# Patient Record
Sex: Female | Born: 1940
Health system: Southern US, Community
[De-identification: ages and names within clinical notes are randomized; demographics above are authoritative.]

## PROBLEM LIST (undated history)

## (undated) DIAGNOSIS — L509 Urticaria, unspecified: Secondary | ICD-10-CM

## (undated) DIAGNOSIS — I509 Heart failure, unspecified: Secondary | ICD-10-CM

## (undated) DIAGNOSIS — I10 Essential (primary) hypertension: Secondary | ICD-10-CM

## (undated) HISTORY — DX: Urticaria, unspecified: L50.9

## (undated) HISTORY — PX: TUBAL LIGATION: SHX77

## (undated) HISTORY — PX: APPENDECTOMY: SHX54

## (undated) HISTORY — PX: BACK SURGERY: SHX140

---

## 1999-02-05 ENCOUNTER — Other Ambulatory Visit: Admission: RE | Admit: 1999-02-05 | Discharge: 1999-02-05 | Payer: Self-pay | Admitting: Gynecology

## 1999-12-10 ENCOUNTER — Other Ambulatory Visit: Admission: RE | Admit: 1999-12-10 | Discharge: 1999-12-10 | Payer: Self-pay | Admitting: Obstetrics and Gynecology

## 2001-02-04 ENCOUNTER — Other Ambulatory Visit: Admission: RE | Admit: 2001-02-04 | Discharge: 2001-02-04 | Payer: Self-pay | Admitting: Obstetrics and Gynecology

## 2002-04-13 ENCOUNTER — Other Ambulatory Visit: Admission: RE | Admit: 2002-04-13 | Discharge: 2002-04-13 | Payer: Self-pay | Admitting: General Surgery

## 2002-04-26 ENCOUNTER — Other Ambulatory Visit: Admission: RE | Admit: 2002-04-26 | Discharge: 2002-04-26 | Payer: Self-pay | Admitting: Obstetrics and Gynecology

## 2002-06-10 ENCOUNTER — Ambulatory Visit (HOSPITAL_COMMUNITY): Admission: RE | Admit: 2002-06-10 | Discharge: 2002-06-10 | Payer: Self-pay | Admitting: Family Medicine

## 2002-06-10 ENCOUNTER — Encounter: Payer: Self-pay | Admitting: Family Medicine

## 2003-05-10 ENCOUNTER — Encounter: Payer: Self-pay | Admitting: Family Medicine

## 2003-05-10 ENCOUNTER — Ambulatory Visit (HOSPITAL_COMMUNITY): Admission: RE | Admit: 2003-05-10 | Discharge: 2003-05-10 | Payer: Self-pay | Admitting: Family Medicine

## 2004-05-08 ENCOUNTER — Emergency Department (HOSPITAL_COMMUNITY): Admission: EM | Admit: 2004-05-08 | Discharge: 2004-05-08 | Payer: Self-pay | Admitting: Emergency Medicine

## 2004-06-01 ENCOUNTER — Other Ambulatory Visit: Admission: RE | Admit: 2004-06-01 | Discharge: 2004-06-01 | Payer: Self-pay | Admitting: Obstetrics and Gynecology

## 2005-05-27 ENCOUNTER — Ambulatory Visit (HOSPITAL_COMMUNITY): Admission: RE | Admit: 2005-05-27 | Discharge: 2005-05-27 | Payer: Self-pay | Admitting: Family Medicine

## 2005-09-09 ENCOUNTER — Other Ambulatory Visit: Admission: RE | Admit: 2005-09-09 | Discharge: 2005-09-09 | Payer: Self-pay | Admitting: Obstetrics and Gynecology

## 2006-06-03 ENCOUNTER — Ambulatory Visit (HOSPITAL_COMMUNITY): Admission: RE | Admit: 2006-06-03 | Discharge: 2006-06-03 | Payer: Self-pay | Admitting: Family Medicine

## 2007-12-01 ENCOUNTER — Ambulatory Visit (HOSPITAL_COMMUNITY): Admission: RE | Admit: 2007-12-01 | Discharge: 2007-12-01 | Payer: Self-pay | Admitting: Family Medicine

## 2008-12-08 ENCOUNTER — Encounter: Admission: RE | Admit: 2008-12-08 | Discharge: 2008-12-08 | Payer: Self-pay | Admitting: Family Medicine

## 2008-12-20 ENCOUNTER — Encounter: Payer: Self-pay | Admitting: Gastroenterology

## 2009-12-01 ENCOUNTER — Encounter: Admission: RE | Admit: 2009-12-01 | Discharge: 2009-12-01 | Payer: Self-pay | Admitting: Family Medicine

## 2010-03-22 ENCOUNTER — Emergency Department (HOSPITAL_COMMUNITY): Admission: EM | Admit: 2010-03-22 | Discharge: 2010-03-23 | Payer: Self-pay | Admitting: Emergency Medicine

## 2010-09-09 ENCOUNTER — Encounter: Payer: Self-pay | Admitting: Family Medicine

## 2010-11-02 LAB — DIFFERENTIAL
Basophils Absolute: 0.1 10*3/uL (ref 0.0–0.1)
Basophils Relative: 0 % (ref 0–1)
Eosinophils Absolute: 0.4 10*3/uL (ref 0.0–0.7)
Neutrophils Relative %: 60 % (ref 43–77)

## 2010-11-02 LAB — POCT CARDIAC MARKERS
CKMB, poc: 1.6 ng/mL (ref 1.0–8.0)
Myoglobin, poc: 48.3 ng/mL (ref 12–200)
Myoglobin, poc: 82.9 ng/mL (ref 12–200)
Troponin i, poc: <0.05 ng/mL (ref 0.00–0.09)

## 2010-11-02 LAB — CBC
HCT: 42 % (ref 36.0–46.0)
Hemoglobin: 14.4 g/dL (ref 12.0–15.0)
MCV: 88.2 fL (ref 78.0–100.0)
RDW: 14.2 % (ref 11.5–15.5)
WBC: 12.3 10*3/uL — ABNORMAL HIGH (ref 4.0–10.5)

## 2010-11-02 LAB — URINE CULTURE: Culture  Setup Time: 201108050435

## 2010-11-02 LAB — URINALYSIS, ROUTINE W REFLEX MICROSCOPIC
Hgb urine dipstick: NEGATIVE
Ketones, ur: NEGATIVE mg/dL
Protein, ur: NEGATIVE mg/dL
Specific Gravity, Urine: 1.007 (ref 1.005–1.030)
pH: 5.5 (ref 5.0–8.0)

## 2010-11-02 LAB — COMPREHENSIVE METABOLIC PANEL
Albumin: 3.7 g/dL (ref 3.5–5.2)
Alkaline Phosphatase: 78 U/L (ref 39–117)
BUN: 27 mg/dL — ABNORMAL HIGH (ref 6–23)
Calcium: 9.4 mg/dL (ref 8.4–10.5)
Creatinine, Ser: 1 mg/dL (ref 0.4–1.2)
Total Protein: 6.9 g/dL (ref 6.0–8.3)

## 2010-11-02 LAB — LIPASE, BLOOD: Lipase: 22 U/L (ref 11–59)

## 2012-01-09 DIAGNOSIS — Z1231 Encounter for screening mammogram for malignant neoplasm of breast: Secondary | ICD-10-CM | POA: Diagnosis not present

## 2012-01-09 DIAGNOSIS — Z803 Family history of malignant neoplasm of breast: Secondary | ICD-10-CM | POA: Diagnosis not present

## 2012-01-28 DIAGNOSIS — Z01419 Encounter for gynecological examination (general) (routine) without abnormal findings: Secondary | ICD-10-CM | POA: Diagnosis not present

## 2012-01-28 DIAGNOSIS — Z113 Encounter for screening for infections with a predominantly sexual mode of transmission: Secondary | ICD-10-CM | POA: Diagnosis not present

## 2012-01-28 DIAGNOSIS — Z0000 Encounter for general adult medical examination without abnormal findings: Secondary | ICD-10-CM | POA: Diagnosis not present

## 2012-02-06 DIAGNOSIS — H16229 Keratoconjunctivitis sicca, not specified as Sjogren's, unspecified eye: Secondary | ICD-10-CM | POA: Diagnosis not present

## 2012-02-06 DIAGNOSIS — H2510 Age-related nuclear cataract, unspecified eye: Secondary | ICD-10-CM | POA: Diagnosis not present

## 2012-02-07 ENCOUNTER — Ambulatory Visit (HOSPITAL_COMMUNITY)
Admission: RE | Admit: 2012-02-07 | Discharge: 2012-02-07 | Disposition: A | Discharge status: Home / Self Care | Payer: Medicare Other | Source: Ambulatory Visit | Attending: Family Medicine | Admitting: Family Medicine

## 2012-02-07 ENCOUNTER — Other Ambulatory Visit (HOSPITAL_COMMUNITY): Payer: Self-pay | Admitting: Family Medicine

## 2012-02-07 DIAGNOSIS — Z7182 Exercise counseling: Secondary | ICD-10-CM | POA: Diagnosis not present

## 2012-02-07 DIAGNOSIS — E785 Hyperlipidemia, unspecified: Secondary | ICD-10-CM | POA: Diagnosis not present

## 2012-02-07 DIAGNOSIS — Z0000 Encounter for general adult medical examination without abnormal findings: Secondary | ICD-10-CM | POA: Diagnosis not present

## 2012-02-07 DIAGNOSIS — Z79899 Other long term (current) drug therapy: Secondary | ICD-10-CM | POA: Diagnosis not present

## 2012-02-07 DIAGNOSIS — J209 Acute bronchitis, unspecified: Secondary | ICD-10-CM

## 2012-02-07 DIAGNOSIS — Z01419 Encounter for gynecological examination (general) (routine) without abnormal findings: Secondary | ICD-10-CM | POA: Diagnosis not present

## 2012-02-07 DIAGNOSIS — R5381 Other malaise: Secondary | ICD-10-CM | POA: Diagnosis not present

## 2012-02-07 DIAGNOSIS — J42 Unspecified chronic bronchitis: Secondary | ICD-10-CM | POA: Diagnosis not present

## 2012-02-07 DIAGNOSIS — R5383 Other fatigue: Secondary | ICD-10-CM | POA: Diagnosis not present

## 2012-02-07 DIAGNOSIS — Z6841 Body Mass Index (BMI) 40.0 and over, adult: Secondary | ICD-10-CM | POA: Diagnosis not present

## 2012-02-07 DIAGNOSIS — Z87891 Personal history of nicotine dependence: Secondary | ICD-10-CM | POA: Insufficient documentation

## 2012-02-07 DIAGNOSIS — D510 Vitamin B12 deficiency anemia due to intrinsic factor deficiency: Secondary | ICD-10-CM | POA: Diagnosis not present

## 2012-02-07 DIAGNOSIS — I10 Essential (primary) hypertension: Secondary | ICD-10-CM | POA: Diagnosis not present

## 2012-02-07 DIAGNOSIS — Z713 Dietary counseling and surveillance: Secondary | ICD-10-CM | POA: Diagnosis not present

## 2012-04-08 ENCOUNTER — Inpatient Hospital Stay (HOSPITAL_COMMUNITY)
Admission: EM | Admit: 2012-04-08 | Discharge: 2012-04-10 | DRG: 293 | Disposition: A | Discharge status: Home / Self Care | Payer: Medicare Other | Attending: Family Medicine | Admitting: Family Medicine

## 2012-04-08 ENCOUNTER — Encounter (HOSPITAL_COMMUNITY): Payer: Self-pay

## 2012-04-08 ENCOUNTER — Emergency Department (HOSPITAL_COMMUNITY): Payer: Medicare Other

## 2012-04-08 DIAGNOSIS — I5021 Acute systolic (congestive) heart failure: Principal | ICD-10-CM | POA: Diagnosis present

## 2012-04-08 DIAGNOSIS — Z7982 Long term (current) use of aspirin: Secondary | ICD-10-CM

## 2012-04-08 DIAGNOSIS — D72829 Elevated white blood cell count, unspecified: Secondary | ICD-10-CM | POA: Diagnosis present

## 2012-04-08 DIAGNOSIS — I509 Heart failure, unspecified: Secondary | ICD-10-CM | POA: Diagnosis present

## 2012-04-08 DIAGNOSIS — I5031 Acute diastolic (congestive) heart failure: Secondary | ICD-10-CM | POA: Diagnosis not present

## 2012-04-08 DIAGNOSIS — R51 Headache: Secondary | ICD-10-CM | POA: Diagnosis not present

## 2012-04-08 DIAGNOSIS — I10 Essential (primary) hypertension: Secondary | ICD-10-CM | POA: Diagnosis not present

## 2012-04-08 DIAGNOSIS — R079 Chest pain, unspecified: Secondary | ICD-10-CM | POA: Diagnosis not present

## 2012-04-08 DIAGNOSIS — I5041 Acute combined systolic (congestive) and diastolic (congestive) heart failure: Secondary | ICD-10-CM | POA: Diagnosis present

## 2012-04-08 DIAGNOSIS — F17200 Nicotine dependence, unspecified, uncomplicated: Secondary | ICD-10-CM | POA: Diagnosis not present

## 2012-04-08 DIAGNOSIS — R609 Edema, unspecified: Secondary | ICD-10-CM | POA: Diagnosis not present

## 2012-04-08 DIAGNOSIS — Z79899 Other long term (current) drug therapy: Secondary | ICD-10-CM | POA: Diagnosis not present

## 2012-04-08 DIAGNOSIS — J811 Chronic pulmonary edema: Secondary | ICD-10-CM | POA: Diagnosis not present

## 2012-04-08 DIAGNOSIS — J984 Other disorders of lung: Secondary | ICD-10-CM | POA: Diagnosis not present

## 2012-04-08 DIAGNOSIS — Z720 Tobacco use: Secondary | ICD-10-CM

## 2012-04-08 DIAGNOSIS — R0602 Shortness of breath: Secondary | ICD-10-CM | POA: Diagnosis not present

## 2012-04-08 DIAGNOSIS — R519 Headache, unspecified: Secondary | ICD-10-CM | POA: Diagnosis present

## 2012-04-08 HISTORY — DX: Essential (primary) hypertension: I10

## 2012-04-08 LAB — COMPREHENSIVE METABOLIC PANEL
ALT: 20 U/L (ref 0–35)
AST: 23 U/L (ref 0–37)
BUN: 11 mg/dL (ref 6–23)
CO2: 27 mEq/L (ref 19–32)
Chloride: 97 mEq/L (ref 96–112)
GFR calc non Af Amer: 65 mL/min — ABNORMAL LOW (ref >90)
Potassium: 3.9 mEq/L (ref 3.5–5.1)
Sodium: 134 mEq/L — ABNORMAL LOW (ref 135–145)
Total Bilirubin: 0.6 mg/dL (ref 0.3–1.2)

## 2012-04-08 LAB — TROPONIN I: Troponin I: <0.3 ng/mL (ref <0.30)

## 2012-04-08 LAB — CBC
HCT: 40.3 % (ref 36.0–46.0)
MCH: 30.3 pg (ref 26.0–34.0)
MCHC: 34.2 g/dL (ref 30.0–36.0)
MCV: 88.4 fL (ref 78.0–100.0)
RBC: 4.56 MIL/uL (ref 3.87–5.11)
RDW: 14.1 % (ref 11.5–15.5)
WBC: 13.7 10*3/uL — ABNORMAL HIGH (ref 4.0–10.5)

## 2012-04-08 LAB — PRO B NATRIURETIC PEPTIDE: Pro B Natriuretic peptide (BNP): 2231 pg/mL — ABNORMAL HIGH (ref 0–125)

## 2012-04-08 MED ORDER — ALBUTEROL SULFATE (5 MG/ML) 0.5% IN NEBU
5.0000 mg | INHALATION_SOLUTION | Freq: Once | RESPIRATORY_TRACT | Status: AC
  Administered 2012-04-08: 5 mg via RESPIRATORY_TRACT
  Filled 2012-04-08: qty 20

## 2012-04-08 MED ORDER — SODIUM CHLORIDE 0.9 % IV SOLN
INTRAVENOUS | Status: DC
  Administered 2012-04-08: 20 mL via INTRAVENOUS
  Administered 2012-04-08: 22:00:00 via INTRAVENOUS

## 2012-04-08 MED ORDER — IPRATROPIUM BROMIDE 0.02 % IN SOLN
0.5000 mg | Freq: Once | RESPIRATORY_TRACT | Status: AC
  Administered 2012-04-08: 0.5 mg via RESPIRATORY_TRACT
  Filled 2012-04-08: qty 2.5

## 2012-04-08 MED ORDER — CEFTRIAXONE SODIUM 1 G IJ SOLR
1.0000 g | INTRAMUSCULAR | Status: DC
  Administered 2012-04-08: 1 g via INTRAVENOUS
  Filled 2012-04-08: qty 10

## 2012-04-08 MED ORDER — HYDROCODONE-ACETAMINOPHEN 5-325 MG PO TABS
1.0000 | ORAL_TABLET | Freq: Once | ORAL | Status: AC
  Administered 2012-04-08: 1 via ORAL
  Filled 2012-04-08: qty 1

## 2012-04-08 MED ORDER — FUROSEMIDE 10 MG/ML IJ SOLN
40.0000 mg | Freq: Once | INTRAMUSCULAR | Status: AC
  Administered 2012-04-09: 40 mg via INTRAVENOUS
  Filled 2012-04-08: qty 4

## 2012-04-08 MED ORDER — DEXTROSE 5 % IV SOLN
500.0000 mg | INTRAVENOUS | Status: DC
  Administered 2012-04-09: 500 mg via INTRAVENOUS
  Filled 2012-04-08: qty 500

## 2012-04-08 NOTE — ED Notes (Signed)
Pt has been experiencing shortness of breath for 5 days, she also reports starting to have chest pain for 3 hours.

## 2012-04-08 NOTE — ED Provider Notes (Signed)
History     CSN: [id number]  Arrival date & time 04/08/12  2121   First MD Initiated Contact with Patient 04/08/12 2137      Chief Complaint  Patient presents with  . Respiratory Distress  . Chest Pain    (Consider location/radiation/quality/duration/timing/severity/associated sxs/prior treatment) The history is provided by the patient.  pt c/o sob, and generally not feeling well x 1 week. Symptoms gradual onset, constant. Pt denies specific exacerbating or alleviating factors. Occasional productive cough but pt states is smoker, and has chronic cough. Denies fever or chills.  Has also noted vague chest tightness in past day. Denies hx same. No hx cad or family hx premature cad. Denies orthopnea or pnd. No hx chf. Has noted bil lower leg/ankle swelling in past week. No unilateral leg pain or swelling. No hx dvt or pe. Denies any pleuritic cp.  Pt is smoker, denies hx asthma or copd. Denies hx inhaler use.     Past Medical History  Diagnosis Date  . Hypertension     Past Surgical History  Procedure Date  . Back surgery     No family history on file.  History  Substance Use Topics  . Smoking status: Current Everyday Smoker -- 1.5 packs/day  . Smokeless tobacco: Not on file  . Alcohol Use: No    OB History    Grav Para Term Preterm Abortions TAB SAB Ect Mult Living                  Review of Systems  Constitutional: Negative for fever and chills.  HENT: Negative for neck pain.   Eyes: Negative for redness.  Respiratory: Positive for cough and shortness of breath.   Cardiovascular: Negative for palpitations.  Gastrointestinal: Negative for abdominal pain.  Genitourinary: Negative for flank pain.  Musculoskeletal: Negative for back pain.  Skin: Negative for rash.  Neurological: Negative for headaches.  Hematological: Does not bruise/bleed easily.  Psychiatric/Behavioral: Negative for confusion.    Allergies  Codeine and Sulfa antibiotics  Home Medications    No current outpatient prescriptions on file.  BP 181/64  Pulse 70  Resp 19  Ht 5\' 7"  (1.702 m)  Wt 240 lb (108.863 kg)  BMI 37.59 kg/m2  SpO2 92%  Physical Exam  Nursing note and vitals reviewed. Constitutional: She is oriented to person, place, and time. She appears well-developed and well-nourished. No distress.  HENT:  Nose: Nose normal.  Mouth/Throat: Oropharynx is clear and moist.  Eyes: Conjunctivae are normal. No scleral icterus.  Neck: Neck supple. No JVD present. No tracheal deviation present.  Cardiovascular: Normal rate, regular rhythm, normal heart sounds and intact distal pulses.  Exam reveals no gallop and no friction rub.   No murmur heard. Pulmonary/Chest: Effort normal. No respiratory distress.       Mildly diminished bs bil.   Abdominal: Soft. Normal appearance and bowel sounds are normal. She exhibits no distension. There is no tenderness.  Genitourinary:       No cva tenderness  Musculoskeletal: She exhibits edema. She exhibits no tenderness.       Mild bil ankle/lower leg edema, symmetric.   Neurological: She is alert and oriented to person, place, and time.  Skin: Skin is warm and dry. No rash noted.  Psychiatric: She has a normal mood and affect.    ED Course  Procedures (including critical care time)   Labs Reviewed  CBC  COMPREHENSIVE METABOLIC PANEL  PROTIME-INR  TROPONIN I  PRO B NATRIURETIC PEPTIDE  Results for orders placed during the hospital encounter of 04/08/12  CBC      Component Value Range   WBC 13.7 (*) 4.0 - 10.5 K/uL   RBC 4.56  3.87 - 5.11 MIL/uL   Hemoglobin 13.8  12.0 - 15.0 g/dL   HCT 40.3  36.0 - 46.0 %   MCV 88.4  78.0 - 100.0 fL   MCH 30.3  26.0 - 34.0 pg   MCHC 34.2  30.0 - 36.0 g/dL   RDW 14.1  11.5 - 15.5 %   Platelets 317  150 - 400 K/uL  COMPREHENSIVE METABOLIC PANEL      Component Value Range   Sodium 134 (*) 135 - 145 mEq/L   Potassium 3.9  3.5 - 5.1 mEq/L   Chloride 97  96 - 112 mEq/L   CO2 27  19 -  32 mEq/L   Glucose, Bld 106 (*) 70 - 99 mg/dL   BUN 11  6 - 23 mg/dL   Creatinine, Ser 0.87  0.50 - 1.10 mg/dL   Calcium 9.5  8.4 - 10.5 mg/dL   Total Protein 7.4  6.0 - 8.3 g/dL   Albumin 3.6  3.5 - 5.2 g/dL   AST 23  0 - 37 U/L   ALT 20  0 - 35 U/L   Alkaline Phosphatase 87  39 - 117 U/L   Total Bilirubin 0.6  0.3 - 1.2 mg/dL   GFR calc non Af Amer 65 (*) >90 mL/min   GFR calc Af Amer 75 (*) >90 mL/min  PROTIME-INR      Component Value Range   Prothrombin Time 12.0  11.6 - 15.2 seconds   INR 0.87  0.00 - 1.49  TROPONIN I      Component Value Range   Troponin I <0.30  <0.30 ng/mL  PRO B NATRIURETIC PEPTIDE      Component Value Range   Pro B Natriuretic peptide (BNP) 2231.0 (*) 0 - 125 pg/mL   Dg Chest 2 View  04/08/2012  *RADIOLOGY REPORT*  Clinical Data: Chest pain and short of breath  CHEST - 2 VIEW  Comparison: 02/07/2012  Findings: Cardiac enlargement.  Pulmonary vascular congestion. Increased density in the lungs bilaterally compatible with interstitial edema.  There is more confluent density in the right lower lobe which may be edema or infiltrate.  No pleural effusion.  IMPRESSION: Congestive heart failure with mild edema.  Right lower lobe airspace disease may represent edema or infiltrate.   Original Report Authenticated By: Truett Perna, M.D.       MDM  Iv ns. Labs. Cxr.    Date: 04/08/2012  Rate: 76  Rhythm: normal sinus rhythm  QRS Axis: normal  Intervals: normal  ST/T Wave abnormalities: nonspecific ST changes  Conduction Disutrbances:none  Narrative Interpretation:   Old EKG Reviewed: changes noted   chf on cxr, bil ankle edema. Lasix 40 iv.  Alb neb.   Possible rll pna on cxr. ?cap, will rx rocephin and zithromax.  Discussed w [facility], states team 10, tele,  Recheck pt, sl improved, no inc wob.  No cp.       Mirna Mires, MD 04/08/12 2352

## 2012-04-08 NOTE — ED Notes (Signed)
Pt asked for [place] to refill her water bottle.  EMS and tech asked to wait until the MD was in to see her.  Pt then stated "you can get me water or ill get it from myself in the sink" and continued to drink from the water bottle.  RN at bedside at that time.

## 2012-04-09 ENCOUNTER — Encounter (HOSPITAL_COMMUNITY): Payer: Self-pay | Admitting: Internal Medicine

## 2012-04-09 DIAGNOSIS — F17200 Nicotine dependence, unspecified, uncomplicated: Secondary | ICD-10-CM

## 2012-04-09 DIAGNOSIS — Z720 Tobacco use: Secondary | ICD-10-CM | POA: Diagnosis present

## 2012-04-09 DIAGNOSIS — R519 Headache, unspecified: Secondary | ICD-10-CM | POA: Diagnosis present

## 2012-04-09 DIAGNOSIS — R51 Headache: Secondary | ICD-10-CM | POA: Diagnosis present

## 2012-04-09 DIAGNOSIS — R0602 Shortness of breath: Secondary | ICD-10-CM

## 2012-04-09 DIAGNOSIS — I10 Essential (primary) hypertension: Secondary | ICD-10-CM

## 2012-04-09 DIAGNOSIS — I5031 Acute diastolic (congestive) heart failure: Secondary | ICD-10-CM | POA: Diagnosis present

## 2012-04-09 DIAGNOSIS — I509 Heart failure, unspecified: Secondary | ICD-10-CM

## 2012-04-09 DIAGNOSIS — I5041 Acute combined systolic (congestive) and diastolic (congestive) heart failure: Secondary | ICD-10-CM | POA: Diagnosis present

## 2012-04-09 DIAGNOSIS — R609 Edema, unspecified: Secondary | ICD-10-CM

## 2012-04-09 LAB — CBC
Hemoglobin: 11.9 g/dL — ABNORMAL LOW (ref 12.0–15.0)
MCH: 29.2 pg (ref 26.0–34.0)
MCHC: 33.4 g/dL (ref 30.0–36.0)
RDW: 14 % (ref 11.5–15.5)

## 2012-04-09 LAB — COMPREHENSIVE METABOLIC PANEL
ALT: 15 U/L (ref 0–35)
AST: 20 U/L (ref 0–37)
Alkaline Phosphatase: 73 U/L (ref 39–117)
CO2: 25 mEq/L (ref 19–32)
Calcium: 8.8 mg/dL (ref 8.4–10.5)
Chloride: 99 mEq/L (ref 96–112)
GFR calc Af Amer: 73 mL/min — ABNORMAL LOW (ref >90)
GFR calc non Af Amer: 63 mL/min — ABNORMAL LOW (ref >90)
Glucose, Bld: 80 mg/dL (ref 70–99)
Potassium: 3.5 mEq/L (ref 3.5–5.1)
Sodium: 136 mEq/L (ref 135–145)

## 2012-04-09 LAB — CARDIAC PANEL(CRET KIN+CKTOT+MB+TROPI)
Relative Index: INVALID (ref 0.0–2.5)
Relative Index: INVALID (ref 0.0–2.5)
Total CK: 54 U/L (ref 7–177)
Total CK: 54 U/L (ref 7–177)
Troponin I: <0.3 ng/mL (ref <0.30)
Troponin I: <0.3 ng/mL (ref <0.30)

## 2012-04-09 LAB — TSH: TSH: 1.761 u[IU]/mL (ref 0.350–4.500)

## 2012-04-09 MED ORDER — ACETAMINOPHEN 325 MG PO TABS
650.0000 mg | ORAL_TABLET | Freq: Four times a day (QID) | ORAL | Status: DC | PRN
  Administered 2012-04-09 – 2012-04-10 (×4): 650 mg via ORAL
  Filled 2012-04-09 (×4): qty 2

## 2012-04-09 MED ORDER — OXYCODONE HCL 5 MG PO TABS: 5.0000 mg | ORAL_TABLET | Freq: Four times a day (QID) | ORAL | Status: DC | PRN

## 2012-04-09 MED ORDER — ENOXAPARIN SODIUM 40 MG/0.4ML ~~LOC~~ SOLN
40.0000 mg | SUBCUTANEOUS | Status: DC
  Administered 2012-04-09: 40 mg via SUBCUTANEOUS
  Filled 2012-04-09 (×2): qty 0.4

## 2012-04-09 MED ORDER — DIAZEPAM 5 MG PO TABS: 10.0000 mg | ORAL_TABLET | Freq: Four times a day (QID) | ORAL | Status: DC | PRN

## 2012-04-09 MED ORDER — FUROSEMIDE 10 MG/ML IJ SOLN
60.0000 mg | Freq: Once | INTRAMUSCULAR | Status: AC
  Administered 2012-04-09: 60 mg via INTRAVENOUS

## 2012-04-09 MED ORDER — OMEGA-3-ACID ETHYL ESTERS 1 G PO CAPS
1.0000 g | ORAL_CAPSULE | Freq: Every day | ORAL | Status: DC
  Administered 2012-04-09 – 2012-04-10 (×2): 1 g via ORAL
  Filled 2012-04-09 (×2): qty 1

## 2012-04-09 MED ORDER — QUINAPRIL HCL 10 MG PO TABS
40.0000 mg | ORAL_TABLET | Freq: Every day | ORAL | Status: DC
  Administered 2012-04-09: 40 mg via ORAL
  Filled 2012-04-09 (×2): qty 4

## 2012-04-09 MED ORDER — SODIUM CHLORIDE 0.9 % IJ SOLN
3.0000 mL | Freq: Two times a day (BID) | INTRAMUSCULAR | Status: DC
  Administered 2012-04-09 (×2): 3 mL via INTRAVENOUS

## 2012-04-09 MED ORDER — MOXIFLOXACIN HCL IN NACL 400 MG/250ML IV SOLN
400.0000 mg | INTRAVENOUS | Status: DC
  Administered 2012-04-09: 400 mg via INTRAVENOUS
  Filled 2012-04-09 (×2): qty 250

## 2012-04-09 MED ORDER — FUROSEMIDE 20 MG PO TABS
20.0000 mg | ORAL_TABLET | Freq: Every day | ORAL | Status: DC
  Administered 2012-04-10: 20 mg via ORAL
  Filled 2012-04-09: qty 1

## 2012-04-09 MED ORDER — FUROSEMIDE 10 MG/ML IJ SOLN
40.0000 mg | Freq: Two times a day (BID) | INTRAMUSCULAR | Status: DC
  Administered 2012-04-09: 40 mg via INTRAVENOUS
  Filled 2012-04-09 (×3): qty 4

## 2012-04-09 MED ORDER — ASPIRIN EC 81 MG PO TBEC
81.0000 mg | DELAYED_RELEASE_TABLET | Freq: Every day | ORAL | Status: DC
  Administered 2012-04-09 – 2012-04-10 (×2): 81 mg via ORAL
  Filled 2012-04-09 (×2): qty 1

## 2012-04-09 MED ORDER — ONDANSETRON HCL 4 MG PO TABS: 4.0000 mg | ORAL_TABLET | Freq: Four times a day (QID) | ORAL | Status: DC | PRN

## 2012-04-09 MED ORDER — NICOTINE 21 MG/24HR TD PT24
21.0000 mg | MEDICATED_PATCH | Freq: Every day | TRANSDERMAL | Status: DC
  Administered 2012-04-09 – 2012-04-10 (×2): 21 mg via TRANSDERMAL
  Filled 2012-04-09 (×3): qty 1

## 2012-04-09 MED ORDER — ONDANSETRON HCL 4 MG/2ML IJ SOLN: 4.0000 mg | Freq: Four times a day (QID) | INTRAMUSCULAR | Status: DC | PRN

## 2012-04-09 MED ORDER — HYDRALAZINE HCL 20 MG/ML IJ SOLN
10.0000 mg | INTRAMUSCULAR | Status: DC | PRN
  Filled 2012-04-09: qty 0.5

## 2012-04-09 MED ORDER — SODIUM CHLORIDE 0.9 % IJ SOLN
3.0000 mL | Freq: Two times a day (BID) | INTRAMUSCULAR | Status: DC
  Administered 2012-04-10: 3 mL via INTRAVENOUS

## 2012-04-09 MED ORDER — ACETAMINOPHEN 650 MG RE SUPP: 650.0000 mg | Freq: Four times a day (QID) | RECTAL | Status: DC | PRN

## 2012-04-09 NOTE — Progress Notes (Signed)
  Echocardiogram 2D Echocardiogram has been performed.  Mauricio Po 04/09/2012, 5:36 PM

## 2012-04-09 NOTE — Progress Notes (Signed)
Utilization Review Completed.  

## 2012-04-09 NOTE — Progress Notes (Signed)
*  PRELIMINARY RESULTS* Vascular Ultrasound Lower extremity venous duplex has been completed.  Preliminary findings: Bilaterally no evidence of DVT. Baker's cyst on the right.  Landry Mellow, RDMS, RVT  04/09/2012, 5:34 PM

## 2012-04-09 NOTE — H&P (Signed)
Brittany Jackson is an 71 y.o. female.   The patient was seen and examined on April 09, 2012 at 1:15 AM. PCP - Dr. Elsie Lincoln. Chief Complaint: Increased blood pressure and headache. HPI: 71 year old female with history of hypertension and tobacco abuse for the last 3-4 days has been experiencing headache most in the frontal area with exertional shortness of breath. Yesterday she also started noticing bilateral lower extremity edema. She takes Accupril and only takes HCTZ when needed. Yesterday while at her friend's house since she's been having these symptoms she checked her blood pressure and was found to be more than [id number] systolic as per the patient. She came to the ER because of the symptoms. In the ER chest x-ray shows congestion with increased BNP. At this time patient has been admitted for further management of her CHF. Patient also has been having some nonproductive cough which has been chronic. Distress she also had some mild chest discomfort. Presently has no chest pain.  Past Medical History  Diagnosis Date  . Hypertension     Past Surgical History  Procedure Date  . Back surgery   . Tubal ligation     Family History  Problem Relation Age of Onset  . Hypertension Sister    Social History:  reports that she has been smoking.  She does not have any smokeless tobacco history on file. She reports that she does not drink alcohol. Her drug history not on file.  Allergies:  Allergies  Allergen Reactions  . Codeine     Unknown  . Sulfa Antibiotics     Unknown    Medications Prior to Admission  Medication Sig Dispense Refill  . aspirin EC 81 MG tablet Take 81 mg by mouth daily.      . Cyanocobalamin (B-12 PO) Take 1 tablet by mouth daily.      . diazepam (VALIUM) 10 MG tablet Take 10 mg by mouth every 6 (six) hours as needed. For anxiety/headache      . hydrochlorothiazide (HYDRODIURIL) 25 MG tablet Take 25 mg by mouth daily as needed. For fluid retention      . omega-3 acid  ethyl esters (LOVAZA) 1 G capsule Take 1 g by mouth daily.      . Pyridoxine HCl (B-6 PO) Take 1 tablet by mouth daily.      . quinapril (ACCUPRIL) 40 MG tablet Take 40 mg by mouth at bedtime.      . Red Yeast Rice 600 MG CAPS Take 1 capsule by mouth daily.        Results for orders placed during the hospital encounter of 04/08/12 (from the past 48 hour(s))  CBC     Status: Abnormal   Collection Time   04/08/12 10:23 PM      Component Value Range Comment   WBC 13.7 (*) 4.0 - 10.5 K/uL    RBC 4.56  3.87 - 5.11 MIL/uL    Hemoglobin 13.8  12.0 - 15.0 g/dL    HCT 40.3  36.0 - 46.0 %    MCV 88.4  78.0 - 100.0 fL    MCH 30.3  26.0 - 34.0 pg    MCHC 34.2  30.0 - 36.0 g/dL    RDW 14.1  11.5 - 15.5 %    Platelets 317  150 - 400 K/uL   COMPREHENSIVE METABOLIC PANEL     Status: Abnormal   Collection Time   04/08/12 10:23 PM      Component Value Range Comment  Sodium 134 (*) 135 - 145 mEq/L    Potassium 3.9  3.5 - 5.1 mEq/L    Chloride 97  96 - 112 mEq/L    CO2 27  19 - 32 mEq/L    Glucose, Bld 106 (*) 70 - 99 mg/dL    BUN 11  6 - 23 mg/dL    Creatinine, Ser 0.87  0.50 - 1.10 mg/dL    Calcium 9.5  8.4 - 10.5 mg/dL    Total Protein 7.4  6.0 - 8.3 g/dL    Albumin 3.6  3.5 - 5.2 g/dL    AST 23  0 - 37 U/L    ALT 20  0 - 35 U/L    Alkaline Phosphatase 87  39 - 117 U/L    Total Bilirubin 0.6  0.3 - 1.2 mg/dL    GFR calc non Af Amer 65 (*) >90 mL/min    GFR calc Af Amer 75 (*) >90 mL/min   PROTIME-INR     Status: Normal   Collection Time   04/08/12 10:23 PM      Component Value Range Comment   Prothrombin Time 12.0  11.6 - 15.2 seconds    INR 0.87  0.00 - 1.49   TROPONIN I     Status: Normal   Collection Time   04/08/12 10:23 PM      Component Value Range Comment   Troponin I <0.30  <0.30 ng/mL   PRO B NATRIURETIC PEPTIDE     Status: Abnormal   Collection Time   04/08/12 10:23 PM      Component Value Range Comment   Pro B Natriuretic peptide (BNP) 2231.0 (*) 0 - 125 pg/mL    Dg  Chest 2 View  04/08/2012  *RADIOLOGY REPORT*  Clinical Data: Chest pain and short of breath  CHEST - 2 VIEW  Comparison: 02/07/2012  Findings: Cardiac enlargement.  Pulmonary vascular congestion. Increased density in the lungs bilaterally compatible with interstitial edema.  There is more confluent density in the right lower lobe which may be edema or infiltrate.  No pleural effusion.  IMPRESSION: Congestive heart failure with mild edema.  Right lower lobe airspace disease may represent edema or infiltrate.   Original Report Authenticated By: Truett Perna, M.D.     Review of Systems  Constitutional: Negative.   Eyes: Negative.   Respiratory: Positive for cough and shortness of breath.   Cardiovascular: Negative.   Gastrointestinal: Negative.   Genitourinary: Negative.   Musculoskeletal: Negative.   Skin: Negative.   Neurological: Positive for headaches.  Endo/Heme/Allergies: Negative.   Psychiatric/Behavioral: Negative.     Blood pressure 169/42, pulse 66, resp. rate 24, height 5\' 7"  (1.702 m), weight 108.863 kg (240 lb), SpO2 95.00%. Physical Exam  Constitutional: She is oriented to person, place, and time. She appears well-developed and well-nourished. No distress.  HENT:  Head: Normocephalic and atraumatic.  Right Ear: External ear normal.  Left Ear: External ear normal.  Nose: Nose normal.  Mouth/Throat: Oropharynx is clear and moist. No oropharyngeal exudate.  Eyes: Conjunctivae are normal. Pupils are equal, round, and reactive to light. Right eye exhibits no discharge. Left eye exhibits no discharge. No scleral icterus.  Neck: Normal range of motion. Neck supple.  Cardiovascular: Normal rate and regular rhythm.   Respiratory: Effort normal and breath sounds normal. No respiratory distress. She has no wheezes. She has no rales.  GI: Soft. Bowel sounds are normal. She exhibits no distension. There is no tenderness. There is no  rebound.  Musculoskeletal: Normal range of motion.  She exhibits edema (Bilateral lower extremity edema.). She exhibits no tenderness.  Neurological: She is alert and oriented to person, place, and time.       Moves all extremities.  Skin: Skin is warm and dry. She is not diaphoretic.  Psychiatric: Her behavior is normal.     Assessment/Plan #1. Decompensated CHF - patient has been started on Lasix 40 mg. We will continue every 12 and closely follow intake output. Check 2-D echo and Dopplers of her lower extremity. Cycle cardiac markers given the brief chest pain. #2. Uncontrolled hypertension - continue Accupril. Patient has been started Lasix at this time. Closely follow blood pressure trends. #3. Headache probably from #2 reason. If the headache does not get better get a CT head. #4. Possible pneumonia - patient was started on antibiotics in the ER as chest x-ray was read as possible infiltrates. Patient has no fever but does have mild leukocytosis and also has some cough. For now we'll continue Avelox. #5. Tobacco abuse - strongly advised patient to quit smoking.  CODE STATUS - full code.  Eh Sesay N. 04/09/2012, 1:31 AM

## 2012-04-09 NOTE — Progress Notes (Signed)
[facility] PROGRESS NOTE  LAURISSA GOBBI [id number] DOB: 23-Apr-1941 DOA: 04/08/2012 PCP: Leonides Grills, MD  Assessment/Plan: Principal Problem:  *CHF (congestive heart failure) Active Problems:  HTN (hypertension)  Tobacco abuse  Headache  1. Acute CHF exacerbation At this point Echocardiogram pending and not sure whether it is systolic or diastolic.   -Awaiting results of echo.   -Patient is much improved today after IV lasix administration.  Has had a net loss of > 3 Liters on IV lasix.  Symptoms are improved today and she mentions that she is breathing better currently.  Denies any fever, chills, or productive cough of yellow green mucus.   - Cardiac enzymes x 3 negative - Doppler of lower extremities negative - Avelox discontinued as I don't suspect that patient has pulmonary infection (she is afebrile with no productive cough of any sputum).  2. HTN: - Well controlled currently on quinapril.  Patient may need B blocker.  Will await results of echo  3. Tobacco abuse - Have recommended cessation - Will place order for Nicoderm patch  4. Headache - resolved spontaneously.   Code Status: full Family Communication: no family at bedside Disposition Plan: Pending resolution of SOB.  Likely in the next 1-2 days.   Brief narrative: Patient is a 71 y/o CF that presented to the ED with SOB.  Was given lasix and her SOB has improved.  Has not had any fever or chills. Was started on Avelox initially but patient has not had any symptoms consistent with pneumonia and thus was subsequently discontinued.  Echocardiogram pending.  Consultants:  None  Procedures:  Echocardiogram  Doppler of lower extremities  Antibiotics:  Avelox (will d/c today 8/22)  HPI/Subjective: Pt mentions that her breathing is improved today.  No acute issues reported overnight.  Denies any fever, chills, hemoptysis, nausea, or productive cough.  Has been urinating a lot  reportedly.  Objective: Filed Vitals:   04/09/12 0455 04/09/12 1013 04/09/12 1121 04/09/12 1425  BP: 136/66 112/50  122/52  Pulse: 64 67  63  Temp: 97.6 F (36.4 C)  98.2 F (36.8 C) 97.7 F (36.5 C)  TempSrc: Oral  Oral Oral  Resp: 20  21 21   Height:      Weight: 121.791 kg (268 lb 8 oz)     SpO2: 97%  98% 95%    Intake/Output Summary (Last 24 hours) at 04/09/12 1809 Last data filed at 04/09/12 1412  Gross per 24 hour  Intake   1290 ml  Output   4840 ml  Net  -3550 ml   Filed Weights   04/08/12 2123 04/09/12 0131 04/09/12 0455  Weight: 108.863 kg (240 lb) 122.29 kg (269 lb 9.6 oz) 121.791 kg (268 lb 8 oz)    Exam:   General:  Pt in NAD, A and O x 3  Cardiovascular: RRR, No MRG  Respiratory: CTA BL, no wheezes  Abdomen: Soft, NT, ND  Data Reviewed: Basic Metabolic Panel:  Lab [id number] 04/08/12 2223  NA 136 134*  K 3.5 3.9  CL 99 97  CO2 25 27  GLUCOSE 80 106*  BUN 12 11  CREATININE 0.89 0.87  CALCIUM 8.8 9.5  MG -- --  PHOS -- --   Liver Function Tests:  Lab 04/09/12 0540 04/08/12 2223  AST 20 23  ALT 15 20  ALKPHOS 73 87  BILITOT 0.5 0.6  PROT 6.2 7.4  ALBUMIN 3.1* 3.6   No results found for this basename: LIPASE:5,AMYLASE:5 in the  last 168 hours No results found for this basename: AMMONIA:5 in the last 168 hours CBC:  Lab 04/09/12 0540 04/08/12 2223  WBC 11.3* 13.7*  NEUTROABS -- --  HGB 11.9* 13.8  HCT 35.6* 40.3  MCV 87.5 88.4  PLT 269 317   Cardiac Enzymes:  Lab 04/09/12 0928 04/09/12 0135 04/08/12 2223  CKTOTAL 54 61 --  CKMB 3.0 3.3 --  CKMBINDEX -- -- --  TROPONINI <0.30 <0.30 <0.30   BNP (last 3 results)  Basename 04/08/12 2223  PROBNP 2231.0*   CBG: No results found for this basename: GLUCAP:5 in the last 168 hours  No results found for this or any previous visit (from the past 240 hour(s)).   Studies: Dg Chest 2 View  04/08/2012  *RADIOLOGY REPORT*  Clinical Data: Chest pain and short of breath  CHEST -  2 VIEW  Comparison: 02/07/2012  Findings: Cardiac enlargement.  Pulmonary vascular congestion. Increased density in the lungs bilaterally compatible with interstitial edema.  There is more confluent density in the right lower lobe which may be edema or infiltrate.  No pleural effusion.  IMPRESSION: Congestive heart failure with mild edema.  Right lower lobe airspace disease may represent edema or infiltrate.   Original Report Authenticated By: Truett Perna, M.D.     Scheduled Meds:   . albuterol  5 mg Nebulization Once  . aspirin EC  81 mg Oral Daily  . enoxaparin (LOVENOX) injection  40 mg Subcutaneous Q24H  . furosemide  40 mg Intravenous Once  . furosemide  60 mg Intravenous Once  . HYDROcodone-acetaminophen  1 tablet Oral Once  . ipratropium  0.5 mg Nebulization Once  . moxifloxacin  400 mg Intravenous Q24H  . nicotine  21 mg Transdermal Daily  . omega-3 acid ethyl esters  1 g Oral Daily  . quinapril  40 mg Oral QHS  . sodium chloride  3 mL Intravenous Q12H  . sodium chloride  3 mL Intravenous Q12H  . DISCONTD: azithromycin  500 mg Intravenous Q24H  . DISCONTD: cefTRIAXone (ROCEPHIN)  IV  1 g Intravenous Q24H  . DISCONTD: furosemide  40 mg Intravenous Q12H   Continuous Infusions:   . DISCONTD: sodium chloride 20 mL (04/08/12 2330)    Principal Problem:  *CHF (congestive heart failure) Active Problems:  HTN (hypertension)  Tobacco abuse  Headache    Time spent: > 30 minutes    Velvet Bathe  [facility] Pager [phone]. If 8PM-8AM, please contact night-coverage at [email or web address], password [facility] 04/09/2012, 6:09 PM  LOS: 1 day

## 2012-04-10 DIAGNOSIS — F17200 Nicotine dependence, unspecified, uncomplicated: Secondary | ICD-10-CM | POA: Diagnosis not present

## 2012-04-10 DIAGNOSIS — I5031 Acute diastolic (congestive) heart failure: Secondary | ICD-10-CM | POA: Diagnosis not present

## 2012-04-10 DIAGNOSIS — I509 Heart failure, unspecified: Secondary | ICD-10-CM | POA: Diagnosis not present

## 2012-04-10 DIAGNOSIS — I10 Essential (primary) hypertension: Secondary | ICD-10-CM | POA: Diagnosis not present

## 2012-04-10 LAB — CBC
HCT: 38.1 % (ref 36.0–46.0)
Hemoglobin: 12.8 g/dL (ref 12.0–15.0)
MCH: 29.6 pg (ref 26.0–34.0)
MCHC: 33.6 g/dL (ref 30.0–36.0)
RDW: 14.1 % (ref 11.5–15.5)

## 2012-04-10 LAB — BASIC METABOLIC PANEL
BUN: 19 mg/dL (ref 6–23)
Calcium: 9.4 mg/dL (ref 8.4–10.5)
GFR calc non Af Amer: 47 mL/min — ABNORMAL LOW (ref >90)
Glucose, Bld: 88 mg/dL (ref 70–99)

## 2012-04-10 MED ORDER — FUROSEMIDE 20 MG PO TABS: 10.0000 mg | ORAL_TABLET | ORAL | Status: DC

## 2012-04-10 MED ORDER — FUROSEMIDE 20 MG PO TABS: 10.0000 mg | ORAL_TABLET | Freq: Every day | ORAL | Status: DC

## 2012-04-10 MED ORDER — NICOTINE 21 MG/24HR TD PT24: 1.0000 | MEDICATED_PATCH | Freq: Every day | TRANSDERMAL | Status: AC

## 2012-04-10 NOTE — Progress Notes (Signed)
Pt given DC instructions and pt verbalized understanding.  Pt DC home.

## 2012-04-10 NOTE — Progress Notes (Signed)
I cosign for [employer], assessment, notes, and med administration.

## 2012-04-10 NOTE — Discharge Summary (Signed)
Physician Discharge Summary  Brittany Jackson [id number] DOB: Oct 10, 1940 DOA: 04/08/2012  PCP: Leonides Grills, MD  Admit date: 04/08/2012 Discharge date: 04/10/2012  Recommendations for Outpatient Follow-up:  1. Please follow up with creatinine levels and adjust medication as needed.  Discharge Diagnoses:  Principal Problem:  *Acute diastolic heart failure Active Problems:  HTN (hypertension)  Tobacco abuse  Headache   Discharge Condition: Stable   Diet recommendation: Cardiac with fluid restriction 2 L per day  [facility] Weights   04/09/12 0131 04/09/12 0455 04/10/12 0657  Weight: 122.29 kg (269 lb 9.6 oz) 121.791 kg (268 lb 8 oz) 117.845 kg (259 lb 12.8 oz)    History of present illness:  71 y/o presenting with SOB and DOE.  Was found to have elevated BNP of 2231 and echocardiogram showed grade 1 diastolic heart failure.  Improved on lasix.  Hospital Course:  1. Acute CHF exacerbation - Improved with fluid restriction and lasix - Cardiac enzymes x 3 negative  - Doppler of lower extremities negative  - Avelox discontinued as I don't suspect that patient has pulmonary infection (she is afebrile with no productive cough of any sputum).  2. HTN:  - Well controlled currently on quinapril. Patient may need B blocker will have her follow up with her PCP for further recommendations. 3. Tobacco abuse  - Have recommended cessation  - Will provide script for nicotine patches 4. Headache  - May be secondary to recent cessation of tobacco.  Will have patient follow up with PCP.   Procedures:  Echocardiogram on 04/09/12  Study Conclusions  Left ventricle: The cavity size was normal. Wall thickness was normal. Systolic function was normal. The estimated ejection fraction was in the range of 60% to 65%. Wall motion was normal; there were no regional wall motion abnormalities. Doppler parameters are consistent with abnormal left ventricular relaxation (grade 1  diastolic dysfunction). Transthoracic echocardiography. M-mode, complete 2D, spectral Doppler, and color Doppler. Height: Height: 165.1cm. Height: 65in. Weight: Weight: 121.6kg. Weight: 267.4lb. Body mass index: BMI: 44.6kg/m^2. Body surface area: BSA: 2.30m^2. Blood pressure: 122/52. Patient status: Inpatient. Location: Bedside.  Consultations:  None  Discharge Exam: Filed Vitals:   04/10/12 1017  BP: 124/60  Pulse: 65  Temp: 96.9 F (36.1 C)  Resp: 18   Filed Vitals:   04/09/12 2251 04/10/12 0117 04/10/12 0657 04/10/12 1017  BP: 127/55 108/40 118/54 124/60  Pulse: 66 73 70 65  Temp: 98.3 F (36.8 C) 98 F (36.7 C) 97.4 F (36.3 C) 96.9 F (36.1 C)  TempSrc: Oral Oral Oral Oral  Resp: 18 20 20 18   Height:      Weight:   117.845 kg (259 lb 12.8 oz)   SpO2: 91% 92% 94% 93%    General: Pt in NAD, Alert and Awake Cardiovascular: RRR, No MRG Respiratory: CTA BL, no wheezes  Discharge Instructions  Discharge Orders    Future Orders Please Complete By Expires   Diet - low sodium heart healthy      Increase activity slowly      Discharge instructions      Comments:   Please follow up with your primary care physician in 1-2 weeks.  Also please take your medication as indicated.  Weigh yourself daily.   Call MD for:  temperature >100.4      (HEART FAILURE PATIENTS) Call MD:  Anytime you have any of the following symptoms: 1) 3 pound weight gain in 24 hours or 5 pounds in 1 week 2) shortness of  breath, with or without a dry hacking cough 3) swelling in the hands, feet or stomach 4) if you have to sleep on extra pillows at night in order to breathe.        Medication List  As of 04/10/2012 12:32 PM   STOP taking these medications         B-6 PO      hydrochlorothiazide 25 MG tablet         TAKE these medications         aspirin EC 81 MG tablet   Take 81 mg by mouth daily.      B-12 PO   Take 1 tablet by mouth daily.      diazepam 10 MG tablet   Commonly  known as: VALIUM   Take 10 mg by mouth every 6 (six) hours as needed. For anxiety/headache      furosemide 20 MG tablet   Commonly known as: LASIX   Take 0.5 tablets (10 mg total) by mouth every other day.      nicotine 21 mg/24hr patch   Commonly known as: NICODERM CQ - dosed in mg/24 hours   Place 1 patch onto the skin daily.      omega-3 acid ethyl esters 1 G capsule   Commonly known as: LOVAZA   Take 1 g by mouth daily.      quinapril 40 MG tablet   Commonly known as: ACCUPRIL   Take 40 mg by mouth at bedtime.      Red Yeast Rice 600 MG Caps   Take 1 capsule by mouth daily.              The results of significant diagnostics from this hospitalization (including imaging, microbiology, ancillary and laboratory) are listed below for reference.    Significant Diagnostic Studies: Dg Chest 2 View  04/08/2012  *RADIOLOGY REPORT*  Clinical Data: Chest pain and short of breath  CHEST - 2 VIEW  Comparison: 02/07/2012  Findings: Cardiac enlargement.  Pulmonary vascular congestion. Increased density in the lungs bilaterally compatible with interstitial edema.  There is more confluent density in the right lower lobe which may be edema or infiltrate.  No pleural effusion.  IMPRESSION: Congestive heart failure with mild edema.  Right lower lobe airspace disease may represent edema or infiltrate.   Original Report Authenticated By: Truett Perna, M.D.     Microbiology: No results found for this or any previous visit (from the past 240 hour(s)).   Labs: Basic Metabolic Panel:  Lab [id number] 04/09/12 0540 04/08/12 2223  NA 142 136 134*  K 3.7 3.5 3.9  CL 100 99 97  CO2 31 25 27   GLUCOSE 88 80 106*  BUN 19 12 11   CREATININE 1.14* 0.89 0.87  CALCIUM 9.4 8.8 9.5  MG -- -- --  PHOS -- -- --   Liver Function Tests:  Lab 04/09/12 0540 04/08/12 2223  AST 20 23  ALT 15 20  ALKPHOS 73 87  BILITOT 0.5 0.6  PROT 6.2 7.4  ALBUMIN 3.1* 3.6   No results found for this  basename: LIPASE:5,AMYLASE:5 in the last 168 hours No results found for this basename: AMMONIA:5 in the last 168 hours CBC:  Lab 04/10/12 0605 04/09/12 0540 04/08/12 2223  WBC 9.1 11.3* 13.7*  NEUTROABS -- -- --  HGB 12.8 11.9* 13.8  HCT 38.1 35.6* 40.3  MCV 88.0 87.5 88.4  PLT 263 269 317   Cardiac Enzymes:  Lab 04/09/12 1735  04/09/12 0928 04/09/12 0135 04/08/12 2223  CKTOTAL 54 54 61 --  CKMB 2.7 3.0 3.3 --  CKMBINDEX -- -- -- --  TROPONINI <0.30 <0.30 <0.30 <0.30   BNP: BNP (last 3 results)  Basename 04/08/12 2223  PROBNP 2231.0*   CBG: No results found for this basename: GLUCAP:5 in the last 168 hours  Time coordinating discharge: > 30 minutes  Signed:  Velvet Bathe  [facility] 04/10/2012, 12:32 PM

## 2012-05-01 DIAGNOSIS — D510 Vitamin B12 deficiency anemia due to intrinsic factor deficiency: Secondary | ICD-10-CM | POA: Diagnosis not present

## 2012-05-01 DIAGNOSIS — I10 Essential (primary) hypertension: Secondary | ICD-10-CM | POA: Diagnosis not present

## 2012-05-01 DIAGNOSIS — Z6841 Body Mass Index (BMI) 40.0 and over, adult: Secondary | ICD-10-CM | POA: Diagnosis not present

## 2012-05-01 DIAGNOSIS — Z23 Encounter for immunization: Secondary | ICD-10-CM | POA: Diagnosis not present

## 2012-05-01 DIAGNOSIS — G4700 Insomnia, unspecified: Secondary | ICD-10-CM | POA: Diagnosis not present

## 2013-01-25 DIAGNOSIS — Z6841 Body Mass Index (BMI) 40.0 and over, adult: Secondary | ICD-10-CM | POA: Diagnosis not present

## 2013-01-25 DIAGNOSIS — I10 Essential (primary) hypertension: Secondary | ICD-10-CM | POA: Diagnosis not present

## 2013-01-25 DIAGNOSIS — F329 Major depressive disorder, single episode, unspecified: Secondary | ICD-10-CM | POA: Diagnosis not present

## 2013-01-25 DIAGNOSIS — D510 Vitamin B12 deficiency anemia due to intrinsic factor deficiency: Secondary | ICD-10-CM | POA: Diagnosis not present

## 2013-01-25 DIAGNOSIS — G4700 Insomnia, unspecified: Secondary | ICD-10-CM | POA: Diagnosis not present

## 2013-08-19 ENCOUNTER — Emergency Department: Payer: Self-pay | Admitting: Emergency Medicine

## 2013-08-19 DIAGNOSIS — S59909A Unspecified injury of unspecified elbow, initial encounter: Secondary | ICD-10-CM | POA: Diagnosis not present

## 2013-08-19 DIAGNOSIS — F411 Generalized anxiety disorder: Secondary | ICD-10-CM | POA: Diagnosis not present

## 2013-08-19 DIAGNOSIS — S42209A Unspecified fracture of upper end of unspecified humerus, initial encounter for closed fracture: Secondary | ICD-10-CM | POA: Diagnosis not present

## 2013-08-19 DIAGNOSIS — S42293A Other displaced fracture of upper end of unspecified humerus, initial encounter for closed fracture: Secondary | ICD-10-CM | POA: Diagnosis not present

## 2013-08-19 DIAGNOSIS — F17200 Nicotine dependence, unspecified, uncomplicated: Secondary | ICD-10-CM | POA: Diagnosis not present

## 2013-08-19 DIAGNOSIS — S42213A Unspecified displaced fracture of surgical neck of unspecified humerus, initial encounter for closed fracture: Secondary | ICD-10-CM | POA: Diagnosis not present

## 2013-08-19 DIAGNOSIS — S46909A Unspecified injury of unspecified muscle, fascia and tendon at shoulder and upper arm level, unspecified arm, initial encounter: Secondary | ICD-10-CM | POA: Diagnosis not present

## 2013-08-19 DIAGNOSIS — S4980XA Other specified injuries of shoulder and upper arm, unspecified arm, initial encounter: Secondary | ICD-10-CM | POA: Diagnosis not present

## 2013-08-19 DIAGNOSIS — I10 Essential (primary) hypertension: Secondary | ICD-10-CM | POA: Diagnosis not present

## 2013-08-19 DIAGNOSIS — I509 Heart failure, unspecified: Secondary | ICD-10-CM | POA: Diagnosis not present

## 2013-08-19 DIAGNOSIS — Z79899 Other long term (current) drug therapy: Secondary | ICD-10-CM | POA: Diagnosis not present

## 2013-08-21 DIAGNOSIS — M25519 Pain in unspecified shoulder: Secondary | ICD-10-CM | POA: Diagnosis not present

## 2013-08-23 ENCOUNTER — Encounter (HOSPITAL_COMMUNITY): Payer: Self-pay | Admitting: *Deleted

## 2013-08-23 ENCOUNTER — Other Ambulatory Visit: Payer: Self-pay | Admitting: Orthopedic Surgery

## 2013-08-23 ENCOUNTER — Encounter (HOSPITAL_COMMUNITY): Payer: Self-pay | Admitting: Respiratory Therapy

## 2013-08-24 ENCOUNTER — Ambulatory Visit (HOSPITAL_COMMUNITY): Payer: Medicare Other | Admitting: Certified Registered Nurse Anesthetist

## 2013-08-24 ENCOUNTER — Ambulatory Visit (HOSPITAL_COMMUNITY): Payer: Medicare Other

## 2013-08-24 ENCOUNTER — Encounter (HOSPITAL_COMMUNITY): Payer: Self-pay | Admitting: Certified Registered Nurse Anesthetist

## 2013-08-24 ENCOUNTER — Encounter (HOSPITAL_COMMUNITY)
Admission: RE | Disposition: A | Discharge status: Home / Self Care | Payer: Self-pay | Source: Ambulatory Visit | Attending: Orthopedic Surgery

## 2013-08-24 ENCOUNTER — Ambulatory Visit (HOSPITAL_COMMUNITY)
Admission: RE | Admit: 2013-08-24 | Discharge: 2013-08-24 | Disposition: A | Discharge status: Home / Self Care | Payer: Medicare Other | Source: Ambulatory Visit | Attending: Orthopedic Surgery | Admitting: Orthopedic Surgery

## 2013-08-24 ENCOUNTER — Encounter (HOSPITAL_COMMUNITY): Payer: Medicare Other | Admitting: Certified Registered Nurse Anesthetist

## 2013-08-24 DIAGNOSIS — I10 Essential (primary) hypertension: Secondary | ICD-10-CM | POA: Diagnosis not present

## 2013-08-24 DIAGNOSIS — I509 Heart failure, unspecified: Secondary | ICD-10-CM | POA: Insufficient documentation

## 2013-08-24 DIAGNOSIS — Z886 Allergy status to analgesic agent status: Secondary | ICD-10-CM | POA: Diagnosis not present

## 2013-08-24 DIAGNOSIS — F17200 Nicotine dependence, unspecified, uncomplicated: Secondary | ICD-10-CM | POA: Insufficient documentation

## 2013-08-24 DIAGNOSIS — S42202D Unspecified fracture of upper end of left humerus, subsequent encounter for fracture with routine healing: Secondary | ICD-10-CM

## 2013-08-24 DIAGNOSIS — S42293A Other displaced fracture of upper end of unspecified humerus, initial encounter for closed fracture: Secondary | ICD-10-CM | POA: Insufficient documentation

## 2013-08-24 DIAGNOSIS — W19XXXA Unspecified fall, initial encounter: Secondary | ICD-10-CM | POA: Insufficient documentation

## 2013-08-24 DIAGNOSIS — Z7982 Long term (current) use of aspirin: Secondary | ICD-10-CM | POA: Insufficient documentation

## 2013-08-24 DIAGNOSIS — G8918 Other acute postprocedural pain: Secondary | ICD-10-CM | POA: Diagnosis not present

## 2013-08-24 DIAGNOSIS — S42209A Unspecified fracture of upper end of unspecified humerus, initial encounter for closed fracture: Secondary | ICD-10-CM | POA: Diagnosis not present

## 2013-08-24 DIAGNOSIS — Z881 Allergy status to other antibiotic agents status: Secondary | ICD-10-CM | POA: Diagnosis not present

## 2013-08-24 DIAGNOSIS — Z01818 Encounter for other preprocedural examination: Secondary | ICD-10-CM | POA: Diagnosis not present

## 2013-08-24 DIAGNOSIS — Z4789 Encounter for other orthopedic aftercare: Secondary | ICD-10-CM | POA: Diagnosis not present

## 2013-08-24 HISTORY — PX: HUMERUS IM NAIL: SHX1769

## 2013-08-24 HISTORY — DX: Heart failure, unspecified: I50.9

## 2013-08-24 LAB — BASIC METABOLIC PANEL
BUN: 16 mg/dL (ref 6–23)
CALCIUM: 8.9 mg/dL (ref 8.4–10.5)
CO2: 24 meq/L (ref 19–32)
CREATININE: 0.85 mg/dL (ref 0.50–1.10)
Chloride: 100 mEq/L (ref 96–112)
GFR calc Af Amer: 76 mL/min — ABNORMAL LOW (ref >90)
GFR, EST NON AFRICAN AMERICAN: 66 mL/min — AB (ref >90)
GLUCOSE: 97 mg/dL (ref 70–99)
Potassium: 4 mEq/L (ref 3.7–5.3)
SODIUM: 138 meq/L (ref 137–147)

## 2013-08-24 LAB — CBC
HCT: 36.6 % (ref 36.0–46.0)
Hemoglobin: 12.4 g/dL (ref 12.0–15.0)
MCH: 30.1 pg (ref 26.0–34.0)
MCHC: 33.9 g/dL (ref 30.0–36.0)
MCV: 88.8 fL (ref 78.0–100.0)
PLATELETS: 289 10*3/uL (ref 150–400)
RBC: 4.12 MIL/uL (ref 3.87–5.11)
RDW: 13.7 % (ref 11.5–15.5)
WBC: 11.2 10*3/uL — ABNORMAL HIGH (ref 4.0–10.5)

## 2013-08-24 SURGERY — INSERTION, INTRAMEDULLARY ROD, HUMERUS
Anesthesia: Choice | Laterality: Left

## 2013-08-24 MED ORDER — FENTANYL CITRATE 0.05 MG/ML IJ SOLN
INTRAMUSCULAR | Status: DC | PRN
  Administered 2013-08-24: 100 ug via INTRAVENOUS
  Administered 2013-08-24 (×3): 50 ug via INTRAVENOUS

## 2013-08-24 MED ORDER — OXYCODONE-ACETAMINOPHEN 5-325 MG PO TABS: 1.0000 | ORAL_TABLET | ORAL | Status: DC | PRN

## 2013-08-24 MED ORDER — GLYCOPYRROLATE 0.2 MG/ML IJ SOLN
INTRAMUSCULAR | Status: DC | PRN
  Administered 2013-08-24: 0.4 mg via INTRAVENOUS

## 2013-08-24 MED ORDER — NEOSTIGMINE METHYLSULFATE 1 MG/ML IJ SOLN
INTRAMUSCULAR | Status: DC | PRN
  Administered 2013-08-24: 3 mg via INTRAVENOUS

## 2013-08-24 MED ORDER — LIDOCAINE HCL (CARDIAC) 20 MG/ML IV SOLN
INTRAVENOUS | Status: DC | PRN
  Administered 2013-08-24: 30 mg via INTRAVENOUS

## 2013-08-24 MED ORDER — ONDANSETRON HCL 4 MG/2ML IJ SOLN
INTRAMUSCULAR | Status: DC | PRN
  Administered 2013-08-24: 4 mg via INTRAVENOUS

## 2013-08-24 MED ORDER — PHENYLEPHRINE HCL 10 MG/ML IJ SOLN
10.0000 mg | INTRAVENOUS | Status: DC | PRN
  Administered 2013-08-24: 20 ug/min via INTRAVENOUS

## 2013-08-24 MED ORDER — MEPERIDINE HCL 25 MG/ML IJ SOLN: 6.2500 mg | INTRAMUSCULAR | Status: DC | PRN

## 2013-08-24 MED ORDER — MIDAZOLAM HCL 5 MG/5ML IJ SOLN
INTRAMUSCULAR | Status: DC | PRN
  Administered 2013-08-24: 2 mg via INTRAVENOUS

## 2013-08-24 MED ORDER — OXYCODONE HCL 5 MG/5ML PO SOLN: 5.0000 mg | Freq: Once | ORAL | Status: DC | PRN

## 2013-08-24 MED ORDER — PHENYLEPHRINE HCL 10 MG/ML IJ SOLN
INTRAMUSCULAR | Status: DC | PRN
  Administered 2013-08-24: 40 ug via INTRAVENOUS
  Administered 2013-08-24 (×3): 80 ug via INTRAVENOUS
  Administered 2013-08-24: 40 ug via INTRAVENOUS

## 2013-08-24 MED ORDER — CEFAZOLIN SODIUM-DEXTROSE 2-3 GM-% IV SOLR
INTRAVENOUS | Status: AC
  Administered 2013-08-24: 2 g via INTRAVENOUS
  Filled 2013-08-24: qty 50

## 2013-08-24 MED ORDER — OXYCODONE HCL 5 MG PO TABS: 5.0000 mg | ORAL_TABLET | Freq: Once | ORAL | Status: DC | PRN

## 2013-08-24 MED ORDER — MIDAZOLAM HCL 2 MG/2ML IJ SOLN: 0.5000 mg | Freq: Once | INTRAMUSCULAR | Status: DC | PRN

## 2013-08-24 MED ORDER — PROMETHAZINE HCL 25 MG/ML IJ SOLN: 6.2500 mg | INTRAMUSCULAR | Status: DC | PRN

## 2013-08-24 MED ORDER — BUPIVACAINE-EPINEPHRINE PF 0.5-1:200000 % IJ SOLN
INTRAMUSCULAR | Status: DC | PRN
  Administered 2013-08-24: 30 mL

## 2013-08-24 MED ORDER — PROPOFOL 10 MG/ML IV BOLUS
INTRAVENOUS | Status: DC | PRN
  Administered 2013-08-24: 150 mg via INTRAVENOUS

## 2013-08-24 MED ORDER — 0.9 % SODIUM CHLORIDE (POUR BTL) OPTIME
TOPICAL | Status: DC | PRN
  Administered 2013-08-24: 1000 mL

## 2013-08-24 MED ORDER — ROCURONIUM BROMIDE 100 MG/10ML IV SOLN
INTRAVENOUS | Status: DC | PRN
  Administered 2013-08-24: 50 mg via INTRAVENOUS

## 2013-08-24 MED ORDER — HYDROMORPHONE HCL PF 1 MG/ML IJ SOLN
INTRAMUSCULAR | Status: AC
  Filled 2013-08-24: qty 1

## 2013-08-24 MED ORDER — HYDROMORPHONE HCL PF 1 MG/ML IJ SOLN
0.2500 mg | INTRAMUSCULAR | Status: DC | PRN
  Administered 2013-08-24 (×2): 0.5 mg via INTRAVENOUS

## 2013-08-24 MED ORDER — LACTATED RINGERS IV SOLN
INTRAVENOUS | Status: DC
  Administered 2013-08-24 (×2): via INTRAVENOUS

## 2013-08-24 MED ORDER — CEFAZOLIN SODIUM-DEXTROSE 2-3 GM-% IV SOLR: 2.0000 g | INTRAVENOUS | Status: DC

## 2013-08-24 MED ORDER — POVIDONE-IODINE 7.5 % EX SOLN
Freq: Once | CUTANEOUS | Status: DC
  Filled 2013-08-24: qty 118

## 2013-08-24 SURGICAL SUPPLY — 65 items
BIT DRILL 35MM (DRILL) ×1
BIT DRILL 3X200 (DRILL) ×2 IMPLANT
BLADE SURG 15 STRL LF DISP TIS (BLADE) ×1 IMPLANT
BLADE SURG 15 STRL SS (BLADE) ×2
CHLORAPREP W/TINT 26ML (MISCELLANEOUS) ×3 IMPLANT
CLOSURE STERI-STRIP 1/2X4 (GAUZE/BANDAGES/DRESSINGS) ×1
CLOSURE WOUND 1/2 X4 (GAUZE/BANDAGES/DRESSINGS) ×1
CLOTH BEACON ORANGE TIMEOUT ST (SAFETY) ×3 IMPLANT
CLSR STERI-STRIP ANTIMIC 1/2X4 (GAUZE/BANDAGES/DRESSINGS) ×2 IMPLANT
COVER SURGICAL LIGHT HANDLE (MISCELLANEOUS) ×3 IMPLANT
DRAPE INCISE IOBAN 66X45 STRL (DRAPES) ×3 IMPLANT
DRAPE SURG 17X23 STRL (DRAPES) ×3 IMPLANT
DRAPE U-SHAPE 47X51 STRL (DRAPES) ×3 IMPLANT
DRSG ADAPTIC 3X8 NADH LF (GAUZE/BANDAGES/DRESSINGS) ×3 IMPLANT
DRSG MEPILEX BORDER 4X4 (GAUZE/BANDAGES/DRESSINGS) ×3 IMPLANT
DRSG PAD ABDOMINAL 8X10 ST (GAUZE/BANDAGES/DRESSINGS) ×6 IMPLANT
ELECT BLADE 4.0 EZ CLEAN MEGAD (MISCELLANEOUS) ×3
ELECT REM PT RETURN 9FT ADLT (ELECTROSURGICAL) ×3
ELECTRODE BLDE 4.0 EZ CLN MEGD (MISCELLANEOUS) ×1 IMPLANT
ELECTRODE REM PT RTRN 9FT ADLT (ELECTROSURGICAL) ×1 IMPLANT
GLOVE BIO SURGEON STRL SZ7 (GLOVE) ×3 IMPLANT
GLOVE BIO SURGEON STRL SZ7.5 (GLOVE) ×3 IMPLANT
GLOVE BIOGEL PI IND STRL 8 (GLOVE) ×1 IMPLANT
GLOVE BIOGEL PI INDICATOR 8 (GLOVE) ×2
GOWN PREVENTION PLUS LG XLONG (DISPOSABLE) ×3 IMPLANT
GOWN PREVENTION PLUS XLARGE (GOWN DISPOSABLE) ×3 IMPLANT
GOWN STRL NON-REIN LRG LVL3 (GOWN DISPOSABLE) ×6 IMPLANT
KIT BASIN OR (CUSTOM PROCEDURE TRAY) ×3 IMPLANT
KIT ROOM TURNOVER OR (KITS) ×3 IMPLANT
MANIFOLD NEPTUNE II (INSTRUMENTS) ×3 IMPLANT
NAIL HUMERAL LET 8X130MM (Nail) ×3 IMPLANT
NEEDLE HYPO 25GX1X1/2 BEV (NEEDLE) ×3 IMPLANT
NEEDLE MAYO TROCAR (NEEDLE) ×3 IMPLANT
NS IRRIG 1000ML POUR BTL (IV SOLUTION) ×3 IMPLANT
PACK SHOULDER (CUSTOM PROCEDURE TRAY) ×3 IMPLANT
PAD ARMBOARD 7.5X6 YLW CONV (MISCELLANEOUS) ×6 IMPLANT
SCREW DISTAL 4.3X26MM (Screw) ×3 IMPLANT
SCREW PROXIMAL 5X36MM (Screw) ×3 IMPLANT
SCREW PROXIMAL CANN 5X40MM (Screw) ×3 IMPLANT
SLING ARM IMMOBILIZER LRG (SOFTGOODS) ×3 IMPLANT
SLING ARM IMMOBILIZER MED (SOFTGOODS) IMPLANT
SPONGE GAUZE 4X4 12PLY (GAUZE/BANDAGES/DRESSINGS) ×3 IMPLANT
SPONGE LAP 18X18 X RAY DECT (DISPOSABLE) ×3 IMPLANT
SPONGE LAP 4X18 X RAY DECT (DISPOSABLE) ×9 IMPLANT
STRIP CLOSURE SKIN 1/2X4 (GAUZE/BANDAGES/DRESSINGS) ×2 IMPLANT
SUCTION FRAZIER TIP 10 FR DISP (SUCTIONS) ×3 IMPLANT
SUPPORT WRAP ARM LG (MISCELLANEOUS) ×3 IMPLANT
SUT FIBERWIRE #2 38 T-5 BLUE (SUTURE) ×15
SUT MNCRL AB 4-0 PS2 18 (SUTURE) ×3 IMPLANT
SUT PDS AB 1 CT  36 (SUTURE) ×2
SUT PDS AB 1 CT 36 (SUTURE) ×1 IMPLANT
SUT SILK 2 0 TIES 17X18 (SUTURE) ×2
SUT SILK 2-0 18XBRD TIE BLK (SUTURE) ×1 IMPLANT
SUT VIC AB 0 CTB1 27 (SUTURE) ×6 IMPLANT
SUT VIC AB 1 CT1 27 (SUTURE) ×2
SUT VIC AB 1 CT1 27XBRD ANTBC (SUTURE) ×1 IMPLANT
SUT VIC AB 2-0 CT1 27 (SUTURE) ×2
SUT VIC AB 2-0 CT1 TAPERPNT 27 (SUTURE) ×1 IMPLANT
SUTURE FIBERWR #2 38 T-5 BLUE (SUTURE) ×5 IMPLANT
SYR CONTROL 10ML LL (SYRINGE) ×3 IMPLANT
TOWEL OR 17X24 6PK STRL BLUE (TOWEL DISPOSABLE) ×3 IMPLANT
TOWEL OR 17X26 10 PK STRL BLUE (TOWEL DISPOSABLE) ×3 IMPLANT
WATER STERILE IRR 1000ML POUR (IV SOLUTION) ×3 IMPLANT
WIRE GUIDE MODEL 22X500MM (WIRE) ×3 IMPLANT
YANKAUER SUCT BULB TIP NO VENT (SUCTIONS) ×3 IMPLANT

## 2013-08-24 NOTE — Preoperative (Signed)
Beta Blockers   Reason not to administer Beta Blockers:Not Applicable 

## 2013-08-24 NOTE — Discharge Instructions (Signed)

## 2013-08-24 NOTE — H&P (Signed)
Brittany Jackson is an 73 y.o. female.   Chief Complaint: L shoulder pain. HPI: L displaced proximal humerus fracture.  Past Medical History  Diagnosis Date  . Hypertension   . CHF (congestive heart failure)     Past Surgical History  Procedure Laterality Date  . Back surgery    . Tubal ligation    . Appendectomy      Family History  Problem Relation Age of Onset  . Hypertension Sister    Social History:  reports that she has been smoking Cigarettes.  She has been smoking about 0.50 packs per day. She has never used smokeless tobacco. She reports that she drinks alcohol. She reports that she does not use illicit drugs.  Allergies:  Allergies  Allergen Reactions  . Codeine     Unknown  . Sulfa Antibiotics     Unknown    Medications Prior to Admission  Medication Sig Dispense Refill  . aspirin EC 81 MG tablet Take 81 mg by mouth daily.      . Coenzyme Q10 (COQ10 PO) Take 1 tablet by mouth daily.      . Cyanocobalamin (B-12 PO) Take 1 tablet by mouth daily.      . diazepam (VALIUM) 10 MG tablet Take 10 mg by mouth every 6 (six) hours as needed. For anxiety/headache      . furosemide (LASIX) 20 MG tablet Take 0.5 tablets (10 mg total) by mouth every other day.  30 tablet  0  . methocarbamol (ROBAXIN) 500 MG tablet Take 500 mg by mouth daily.      Marland Kitchen omega-3 acid ethyl esters (LOVAZA) 1 G capsule Take 1 g by mouth daily.      Marland Kitchen oxyCODONE-acetaminophen (PERCOCET/ROXICET) 5-325 MG per tablet Take 1 tablet by mouth every 4 (four) hours as needed for severe pain.      Marland Kitchen quinapril (ACCUPRIL) 40 MG tablet Take 40 mg by mouth at bedtime.      . Red Yeast Rice 600 MG CAPS Take 1 capsule by mouth daily.        Results for orders placed during the hospital encounter of 08/24/13 (from the past 48 hour(s))  BASIC METABOLIC PANEL     Status: Abnormal   Collection Time    08/24/13 12:22 PM      Result Value Range   Sodium 138  137 - 147 mEq/L   Potassium 4.0  3.7 - 5.3 mEq/L   Chloride  100  96 - 112 mEq/L   CO2 24  19 - 32 mEq/L   Glucose, Bld 97  70 - 99 mg/dL   BUN 16  6 - 23 mg/dL   Creatinine, Ser 0.85  0.50 - 1.10 mg/dL   Calcium 8.9  8.4 - 10.5 mg/dL   GFR calc non Af Amer 66 (*) >90 mL/min   GFR calc Af Amer 76 (*) >90 mL/min   Comment: (NOTE)     The eGFR has been calculated using the CKD EPI equation.     This calculation has not been validated in all clinical situations.     eGFR's persistently <90 mL/min signify possible Chronic Kidney     Disease.  CBC     Status: Abnormal   Collection Time    08/24/13 12:22 PM      Result Value Range   WBC 11.2 (*) 4.0 - 10.5 K/uL   RBC 4.12  3.87 - 5.11 MIL/uL   Hemoglobin 12.4  12.0 - 15.0 g/dL  HCT 36.6  36.0 - 46.0 %   MCV 88.8  78.0 - 100.0 fL   MCH 30.1  26.0 - 34.0 pg   MCHC 33.9  30.0 - 36.0 g/dL   RDW 13.7  11.5 - 15.5 %   Platelets 289  150 - 400 K/uL   Dg Chest 1 View  08/24/2013   CLINICAL DATA:  Hypertension. Preoperative was assessment. Left proximal humeral fracture.  EXAM: CHEST - 1 VIEW  COMPARISON:  DG SHOULDER 3+V*L* dated 08/19/2013; DG CHEST 2 VIEW dated 04/08/2012; DG CHEST 2 VIEW dated 02/07/2012  FINDINGS: Displaced left humerus surgical neck fracture.  Atherosclerotic aortic arch. Mildly enlarged cardiopericardial silhouette. No overt edema. No pleural effusion identified.  IMPRESSION: 1. Mild cardiomegaly, without edema. 2. Displaced left humerus surgical neck fracture. 3. Atherosclerosis.   Electronically Signed   By: Sherryl Barters M.D.   On: 08/24/2013 13:53    Review of Systems  All other systems reviewed and are negative.    Blood pressure 164/63, pulse 102, temperature 98.3 F (36.8 C), resp. rate 16, height 5' 7"  (1.702 m), weight 122.5 kg (270 lb 1 oz), SpO2 99.00%. Physical Exam  Constitutional: She is oriented to person, place, and time. She appears well-developed and well-nourished.  HENT:  Head: Atraumatic.  Eyes: EOM are normal.  Cardiovascular: Intact distal pulses.    Respiratory: Effort normal.  Musculoskeletal:  L arm in a sling.  TTP proximal humerus , distally NVI.  Neurological: She is alert and oriented to person, place, and time.  Skin: Skin is warm and dry.  Psychiatric: She has a normal mood and affect.     Assessment/Plan L displaced proximal humerus fracture Plan IMN left prox hum fx. Risks / benefits of surgery discussed Consent on chart  NPO for OR Preop antibiotics   Pakou Rainbow WILLIAM 08/24/2013, 3:10 PM

## 2013-08-24 NOTE — Transfer of Care (Signed)
Immediate Anesthesia Transfer of Care Note  Patient: Brittany Jackson  Procedure(s) Performed: Procedure(s): INTRAMEDULLARY (IM) NAIL HUMERAL (Left)  Patient Location: PACU  Anesthesia Type:General and Regional  Level of Consciousness: awake, alert  and oriented  Airway & Oxygen Therapy: Patient Spontanous Breathing and Patient connected to nasal cannula oxygen  Post-op Assessment: Report given to PACU RN and Post -op Vital signs reviewed and stable  Post vital signs: Reviewed and stable  Complications: No apparent anesthesia complications

## 2013-08-24 NOTE — Op Note (Signed)
Procedure(s): INTRAMEDULLARY (IM) NAIL HUMERAL Procedure Note  Brittany Jackson female 73 y.o. 08/24/2013  Procedure(s) and Anesthesia Type: ORIF left proximal humerus fracture with intramedullary nail      Surgeon: Nita Sells   Assistants: Jeanmarie Hubert PA-C (Danielle was present and scrubbed throughout the procedure and was essential in positioning, retraction, exposure, and closure)  Anesthesia: General endotracheal anesthesia with preoperative interscalene block    Procedure Detail  INTRAMEDULLARY (IM) NAIL HUMERAL  Estimated Blood Loss:  less than 50 mL         Drains: none  Blood Given: none         Specimens: none        Complications:  * No complications entered in OR log *         Disposition: PACU - hemodynamically stable.         Condition: stable    Procedure:   INDICATIONS FOR SURGERY: The patient suffered a displaced left proximal humerus fracture after a fall. She was indicated for surgery to restore anatomic alignment and reduce risk of nonunion or malunion.  OPERATIVE FINDINGS: Anatomic reduction of the fracture with a Tornier proximal humeral nail  DESCRIPTION OF PROCEDURE: The patient was identified in preoperative  holding area where I personally marked the operative site after  verifying site, side, and procedure with the patient. The patient was taken back  to the operating room where general anesthesia was induced without  Complication. The patient did receive preoperative IV antibiotics. She had a preoperative interscalene block given by the attending anesthesiologist She was placed in the beachchair position with the back elevated about 60. All extremities were carefully padded and positioned. Left upper extremity was prepped and draped in standard sterile fashion. A approximate 4 cm incision was made in Langer's lines just off the anterolateral edge of the acromion. Dissection was carried down through subcutaneous tissues to  the deltoid fascia which was split longitudinally just off the anterior edge of the acromion. The rotator cuff was exposed. The guidepin was then placed in fluoroscopic imaging with orthogonal views and the awl was used to enter the superior aspect of the humeral head. The nail was then advanced over the guidepin. The top of the nail was set just below the articular surface of the humeral head. The proximal 2 greater tuberosity screws were placed with small incision spreading down to the bone. The distal dynamic interlocking screw was then placed with a small incision spreading down to the bone to avoid any injury to the axillary nerve. Final fluoroscopic imaging in all planes demonstrated appropriate reduction of the fracture with near-anatomic alignment. The screws appear to be in good position and appropriate lengths. The wounds were then copiously irrigated with normal saline subsequent to closed in layers with #1 PDS for the deltoid closure. 2-0 Vicryl and 4-0 Monocryl for skin closure. Steri-Strips were applied. The patient was allowed to awaken from anesthesia, transferred to the stretcher and taken to the recovery room in stable condition.  Postoperative plan she will be discharged home today with her family. She will be in a sling. She will followup with me again in 2 weeks for recheck or sooner with any problems or concerns

## 2013-08-24 NOTE — Anesthesia Procedure Notes (Addendum)
Procedure Name: Intubation Date/Time: 08/24/2013 3:55 PM Performed by: Manuela Schwartz B Pre-anesthesia Checklist: Patient identified, Emergency Drugs available, Suction available, Patient being monitored and Timeout performed Patient Re-evaluated:Patient Re-evaluated prior to inductionOxygen Delivery Method: Circle system utilized Preoxygenation: Pre-oxygenation with 100% oxygen Intubation Type: IV induction Ventilation: Mask ventilation without difficulty Laryngoscope Size: Mac and 3 Grade View: Grade I Tube type: Oral Tube size: 7.5 mm Number of attempts: 1 Airway Equipment and Method: Stylet Placement Confirmation: ETT inserted through vocal cords under direct vision,  positive ETCO2 and breath sounds checked- equal and bilateral Secured at: 22 cm Tube secured with: Tape Dental Injury: Injury to lip    Anesthesia Regional Block:  Interscalene brachial plexus block  Pre-Anesthetic Checklist: ,, timeout performed, Correct Patient, Correct Site, Correct Laterality, Correct Procedure, Correct Position, site marked, Risks and benefits discussed,  Surgical consent,  Pre-op evaluation,  At surgeon's request and post-op pain management  Laterality: Left and Upper  Prep: chloraprep       Needles:  Injection technique: Single-shot  Needle Type: Stimulator Needle - 40      Needle Gauge: 22 and 22 G    Additional Needles:  Procedures: nerve stimulator Interscalene brachial plexus block  Nerve Stimulator or Paresthesia:  Response: forearm twitch, 0.45 mA, 0.1 ms,   Additional Responses:   Narrative:  Start time: 08/24/2013 3:12 PM End time: 08/24/2013 3:35 PM Injection made incrementally with aspirations every 5 mL.  Performed by: Personally  Anesthesiologist: Jenita Seashore, MD  Additional Notes: Pt identified in Holding room.  Monitors applied. Working IV access confirmed. Sterile prep L neck.  #22ga PNS to forearm twitch at 0.73mA threshold.  30cc 0.5% Bupivacaine with 1:200k  epi injected incrementally after negative test dose.  Patient asymptomatic, VSS, no heme aspirated, tolerated well.  Jenita Seashore, MD

## 2013-08-24 NOTE — Anesthesia Preprocedure Evaluation (Addendum)
Anesthesia Evaluation  Patient identified by MRN, date of birth, ID band Patient awake    Reviewed: Allergy & Precautions, H&P , NPO status , Patient's Chart, lab work & pertinent test results  History of Anesthesia Complications Negative for: history of anesthetic complications  Airway Mallampati: II TM Distance: >3 FB Neck ROM: Full    Dental  (+) Dental Advisory Given   Pulmonary Current Smoker,  breath sounds clear to auscultation  Pulmonary exam normal       Cardiovascular hypertension, Pt. on medications +CHF Rhythm:Regular Rate:Normal  '13 ECHO: EF 123456, grade 1 diastolic dysfunction, valves OK   Neuro/Psych    GI/Hepatic negative GI ROS, Neg liver ROS,   Endo/Other    Renal/GU negative Renal ROS     Musculoskeletal   Abdominal (+) + obese,   Peds  Hematology   Anesthesia Other Findings   Reproductive/Obstetrics                         Anesthesia Physical Anesthesia Plan  ASA: III  Anesthesia Plan: General   Post-op Pain Management:    Induction: Intravenous  Airway Management Planned: Oral ETT  Additional Equipment:   Intra-op Plan:   Post-operative Plan: Extubation in OR  Informed Consent: I have reviewed the patients History and Physical, chart, labs and discussed the procedure including the risks, benefits and alternatives for the proposed anesthesia with the patient or authorized representative who has indicated his/her understanding and acceptance.   Dental advisory given  Plan Discussed with: Surgeon and CRNA  Anesthesia Plan Comments: (Plan routine monitors, GETA with interscalene block for pos top analgesia)        Anesthesia Quick Evaluation

## 2013-08-24 NOTE — Anesthesia Postprocedure Evaluation (Signed)
  Anesthesia Post-op Note  Patient: Brittany Jackson  Procedure(s) Performed: Procedure(s): INTRAMEDULLARY (IM) NAIL HUMERAL (Left)  Patient Location: PACU  Anesthesia Type:General and block  Level of Consciousness: awake and alert   Airway and Oxygen Therapy: Patient Spontanous Breathing  Post-op Pain: none  Post-op Assessment: Post-op Vital signs reviewed, Patient's Cardiovascular Status Stable and Respiratory Function Stable  Post-op Vital Signs: Reviewed  Filed Vitals:   08/24/13 1845  BP: 123/47  Pulse: 76  Temp:   Resp: 17    Complications: No apparent anesthesia complications

## 2013-08-24 NOTE — Progress Notes (Signed)
Pt. Has 4 silver bangle and 4 gold bangle bracelets on the left arm. Pt. States they cannot be removed by slipping them off her hand. Stated we could cut the silver ones off but she didn't want the gold ones to be cut off.  Notified Dr. Donnamarie Poag she stated to inform Dr. Tamera Punt. Notified Dr. Tamera Punt and he stated the  Bracelets had to come off . Informed the pt.  Pt. Agreed for the bracelets to come off.Gerhard Munch, R.N. and myself used the ring cutter to remove the  Bracelets and they were given to pt.'s daughter, Jaleh Vant.

## 2013-08-24 NOTE — Progress Notes (Signed)
08/24/13 1249  OBSTRUCTIVE SLEEP APNEA  Have you ever been diagnosed with sleep apnea through a sleep study? No  Do you snore loudly (loud enough to be heard through closed doors)?  0  Do you often feel tired, fatigued, or sleepy during the daytime? 1  Has anyone observed you stop breathing during your sleep? 0  Do you have, or are you being treated for high blood pressure? 1  BMI more than 35 kg/m2? 1  Age over 75 years old? 1  Neck circumference greater than 40 cm/18 inches? 0  Gender: 0  Obstructive Sleep Apnea Score 4  Score 4 or greater  Results sent to PCP

## 2013-08-30 ENCOUNTER — Encounter (HOSPITAL_COMMUNITY): Payer: Self-pay | Admitting: Orthopedic Surgery

## 2013-08-31 DIAGNOSIS — Z6841 Body Mass Index (BMI) 40.0 and over, adult: Secondary | ICD-10-CM | POA: Diagnosis not present

## 2013-08-31 DIAGNOSIS — F411 Generalized anxiety disorder: Secondary | ICD-10-CM | POA: Diagnosis not present

## 2013-08-31 DIAGNOSIS — I10 Essential (primary) hypertension: Secondary | ICD-10-CM | POA: Diagnosis not present

## 2013-08-31 DIAGNOSIS — G4700 Insomnia, unspecified: Secondary | ICD-10-CM | POA: Diagnosis not present

## 2013-09-08 DIAGNOSIS — S42293A Other displaced fracture of upper end of unspecified humerus, initial encounter for closed fracture: Secondary | ICD-10-CM | POA: Diagnosis not present

## 2013-10-06 DIAGNOSIS — S42293A Other displaced fracture of upper end of unspecified humerus, initial encounter for closed fracture: Secondary | ICD-10-CM | POA: Diagnosis not present

## 2013-10-20 ENCOUNTER — Other Ambulatory Visit (HOSPITAL_COMMUNITY): Payer: Self-pay | Admitting: Physician Assistant

## 2013-10-20 DIAGNOSIS — Z0000 Encounter for general adult medical examination without abnormal findings: Secondary | ICD-10-CM

## 2013-10-20 DIAGNOSIS — S42293A Other displaced fracture of upper end of unspecified humerus, initial encounter for closed fracture: Secondary | ICD-10-CM | POA: Diagnosis not present

## 2013-10-20 DIAGNOSIS — M25519 Pain in unspecified shoulder: Secondary | ICD-10-CM | POA: Diagnosis not present

## 2013-10-20 DIAGNOSIS — Z4789 Encounter for other orthopedic aftercare: Secondary | ICD-10-CM | POA: Diagnosis not present

## 2013-10-27 ENCOUNTER — Other Ambulatory Visit (HOSPITAL_COMMUNITY): Payer: Self-pay | Admitting: Physician Assistant

## 2013-10-27 ENCOUNTER — Other Ambulatory Visit (HOSPITAL_COMMUNITY): Payer: Self-pay | Admitting: Oncology

## 2013-10-27 DIAGNOSIS — M25519 Pain in unspecified shoulder: Secondary | ICD-10-CM | POA: Diagnosis not present

## 2013-10-27 DIAGNOSIS — Z780 Asymptomatic menopausal state: Secondary | ICD-10-CM

## 2013-10-27 DIAGNOSIS — S42309A Unspecified fracture of shaft of humerus, unspecified arm, initial encounter for closed fracture: Secondary | ICD-10-CM

## 2013-11-01 ENCOUNTER — Other Ambulatory Visit (HOSPITAL_COMMUNITY): Payer: Medicare Other

## 2013-11-01 DIAGNOSIS — M25519 Pain in unspecified shoulder: Secondary | ICD-10-CM | POA: Diagnosis not present

## 2013-11-01 DIAGNOSIS — S42293A Other displaced fracture of upper end of unspecified humerus, initial encounter for closed fracture: Secondary | ICD-10-CM | POA: Diagnosis not present

## 2013-11-03 DIAGNOSIS — S42293A Other displaced fracture of upper end of unspecified humerus, initial encounter for closed fracture: Secondary | ICD-10-CM | POA: Diagnosis not present

## 2013-11-03 DIAGNOSIS — M25519 Pain in unspecified shoulder: Secondary | ICD-10-CM | POA: Diagnosis not present

## 2013-11-05 DIAGNOSIS — M25519 Pain in unspecified shoulder: Secondary | ICD-10-CM | POA: Diagnosis not present

## 2013-11-05 DIAGNOSIS — S42293A Other displaced fracture of upper end of unspecified humerus, initial encounter for closed fracture: Secondary | ICD-10-CM | POA: Diagnosis not present

## 2013-11-08 DIAGNOSIS — S42293A Other displaced fracture of upper end of unspecified humerus, initial encounter for closed fracture: Secondary | ICD-10-CM | POA: Diagnosis not present

## 2013-11-08 DIAGNOSIS — M25519 Pain in unspecified shoulder: Secondary | ICD-10-CM | POA: Diagnosis not present

## 2013-11-10 DIAGNOSIS — M25519 Pain in unspecified shoulder: Secondary | ICD-10-CM | POA: Diagnosis not present

## 2013-11-10 DIAGNOSIS — S42293A Other displaced fracture of upper end of unspecified humerus, initial encounter for closed fracture: Secondary | ICD-10-CM | POA: Diagnosis not present

## 2013-11-12 DIAGNOSIS — M25519 Pain in unspecified shoulder: Secondary | ICD-10-CM | POA: Diagnosis not present

## 2013-11-15 DIAGNOSIS — S42293A Other displaced fracture of upper end of unspecified humerus, initial encounter for closed fracture: Secondary | ICD-10-CM | POA: Diagnosis not present

## 2013-11-15 DIAGNOSIS — M25519 Pain in unspecified shoulder: Secondary | ICD-10-CM | POA: Diagnosis not present

## 2013-11-17 DIAGNOSIS — M25519 Pain in unspecified shoulder: Secondary | ICD-10-CM | POA: Diagnosis not present

## 2013-11-23 DIAGNOSIS — M25519 Pain in unspecified shoulder: Secondary | ICD-10-CM | POA: Diagnosis not present

## 2013-11-25 DIAGNOSIS — M25519 Pain in unspecified shoulder: Secondary | ICD-10-CM | POA: Diagnosis not present

## 2013-11-30 DIAGNOSIS — M25519 Pain in unspecified shoulder: Secondary | ICD-10-CM | POA: Diagnosis not present

## 2013-12-01 DIAGNOSIS — M25519 Pain in unspecified shoulder: Secondary | ICD-10-CM | POA: Diagnosis not present

## 2013-12-03 DIAGNOSIS — M25519 Pain in unspecified shoulder: Secondary | ICD-10-CM | POA: Diagnosis not present

## 2013-12-06 DIAGNOSIS — M25519 Pain in unspecified shoulder: Secondary | ICD-10-CM | POA: Diagnosis not present

## 2013-12-08 DIAGNOSIS — M25519 Pain in unspecified shoulder: Secondary | ICD-10-CM | POA: Diagnosis not present

## 2013-12-09 DIAGNOSIS — M25519 Pain in unspecified shoulder: Secondary | ICD-10-CM | POA: Diagnosis not present

## 2013-12-13 DIAGNOSIS — M25519 Pain in unspecified shoulder: Secondary | ICD-10-CM | POA: Diagnosis not present

## 2013-12-14 ENCOUNTER — Ambulatory Visit (HOSPITAL_COMMUNITY)
Admission: RE | Admit: 2013-12-14 | Discharge: 2013-12-14 | Disposition: A | Discharge status: Home / Self Care | Payer: Medicare Other | Source: Ambulatory Visit | Attending: Physician Assistant | Admitting: Physician Assistant

## 2013-12-14 DIAGNOSIS — M949 Disorder of cartilage, unspecified: Secondary | ICD-10-CM | POA: Diagnosis not present

## 2013-12-14 DIAGNOSIS — M899 Disorder of bone, unspecified: Secondary | ICD-10-CM | POA: Insufficient documentation

## 2013-12-14 DIAGNOSIS — Z780 Asymptomatic menopausal state: Secondary | ICD-10-CM | POA: Insufficient documentation

## 2013-12-14 DIAGNOSIS — F17200 Nicotine dependence, unspecified, uncomplicated: Secondary | ICD-10-CM | POA: Diagnosis not present

## 2013-12-14 DIAGNOSIS — S42309A Unspecified fracture of shaft of humerus, unspecified arm, initial encounter for closed fracture: Secondary | ICD-10-CM

## 2013-12-15 DIAGNOSIS — M25519 Pain in unspecified shoulder: Secondary | ICD-10-CM | POA: Diagnosis not present

## 2013-12-17 DIAGNOSIS — M25519 Pain in unspecified shoulder: Secondary | ICD-10-CM | POA: Diagnosis not present

## 2013-12-20 DIAGNOSIS — M25519 Pain in unspecified shoulder: Secondary | ICD-10-CM | POA: Diagnosis not present

## 2013-12-22 DIAGNOSIS — M25519 Pain in unspecified shoulder: Secondary | ICD-10-CM | POA: Diagnosis not present

## 2013-12-24 DIAGNOSIS — M25519 Pain in unspecified shoulder: Secondary | ICD-10-CM | POA: Diagnosis not present

## 2013-12-27 DIAGNOSIS — M25519 Pain in unspecified shoulder: Secondary | ICD-10-CM | POA: Diagnosis not present

## 2014-01-03 DIAGNOSIS — M25519 Pain in unspecified shoulder: Secondary | ICD-10-CM | POA: Diagnosis not present

## 2014-01-05 DIAGNOSIS — M25519 Pain in unspecified shoulder: Secondary | ICD-10-CM | POA: Diagnosis not present

## 2014-01-07 DIAGNOSIS — M25519 Pain in unspecified shoulder: Secondary | ICD-10-CM | POA: Diagnosis not present

## 2014-01-11 DIAGNOSIS — M25519 Pain in unspecified shoulder: Secondary | ICD-10-CM | POA: Diagnosis not present

## 2014-01-11 DIAGNOSIS — S42293A Other displaced fracture of upper end of unspecified humerus, initial encounter for closed fracture: Secondary | ICD-10-CM | POA: Diagnosis not present

## 2014-01-13 DIAGNOSIS — S42293A Other displaced fracture of upper end of unspecified humerus, initial encounter for closed fracture: Secondary | ICD-10-CM | POA: Diagnosis not present

## 2014-01-13 DIAGNOSIS — M25519 Pain in unspecified shoulder: Secondary | ICD-10-CM | POA: Diagnosis not present

## 2014-01-24 DIAGNOSIS — S42293A Other displaced fracture of upper end of unspecified humerus, initial encounter for closed fracture: Secondary | ICD-10-CM | POA: Diagnosis not present

## 2014-01-24 DIAGNOSIS — M25519 Pain in unspecified shoulder: Secondary | ICD-10-CM | POA: Diagnosis not present

## 2014-01-31 DIAGNOSIS — S42293A Other displaced fracture of upper end of unspecified humerus, initial encounter for closed fracture: Secondary | ICD-10-CM | POA: Diagnosis not present

## 2014-01-31 DIAGNOSIS — M25519 Pain in unspecified shoulder: Secondary | ICD-10-CM | POA: Diagnosis not present

## 2014-02-02 DIAGNOSIS — S42293A Other displaced fracture of upper end of unspecified humerus, initial encounter for closed fracture: Secondary | ICD-10-CM | POA: Diagnosis not present

## 2014-02-02 DIAGNOSIS — M25519 Pain in unspecified shoulder: Secondary | ICD-10-CM | POA: Diagnosis not present

## 2014-02-08 DIAGNOSIS — M25519 Pain in unspecified shoulder: Secondary | ICD-10-CM | POA: Diagnosis not present

## 2014-02-08 DIAGNOSIS — S42293A Other displaced fracture of upper end of unspecified humerus, initial encounter for closed fracture: Secondary | ICD-10-CM | POA: Diagnosis not present

## 2014-02-11 DIAGNOSIS — M25519 Pain in unspecified shoulder: Secondary | ICD-10-CM | POA: Diagnosis not present

## 2014-02-11 DIAGNOSIS — M25569 Pain in unspecified knee: Secondary | ICD-10-CM | POA: Diagnosis not present

## 2014-02-15 DIAGNOSIS — F411 Generalized anxiety disorder: Secondary | ICD-10-CM | POA: Diagnosis not present

## 2014-02-15 DIAGNOSIS — R0609 Other forms of dyspnea: Secondary | ICD-10-CM | POA: Diagnosis not present

## 2014-02-15 DIAGNOSIS — N182 Chronic kidney disease, stage 2 (mild): Secondary | ICD-10-CM | POA: Diagnosis not present

## 2014-02-15 DIAGNOSIS — J309 Allergic rhinitis, unspecified: Secondary | ICD-10-CM | POA: Diagnosis not present

## 2014-02-15 DIAGNOSIS — Z6841 Body Mass Index (BMI) 40.0 and over, adult: Secondary | ICD-10-CM | POA: Diagnosis not present

## 2014-02-15 DIAGNOSIS — R0989 Other specified symptoms and signs involving the circulatory and respiratory systems: Secondary | ICD-10-CM | POA: Diagnosis not present

## 2014-02-15 DIAGNOSIS — E785 Hyperlipidemia, unspecified: Secondary | ICD-10-CM | POA: Diagnosis not present

## 2014-02-15 DIAGNOSIS — Z79899 Other long term (current) drug therapy: Secondary | ICD-10-CM | POA: Diagnosis not present

## 2014-02-22 DIAGNOSIS — M25519 Pain in unspecified shoulder: Secondary | ICD-10-CM | POA: Diagnosis not present

## 2014-02-25 DIAGNOSIS — M25519 Pain in unspecified shoulder: Secondary | ICD-10-CM | POA: Diagnosis not present

## 2014-02-25 DIAGNOSIS — S42293A Other displaced fracture of upper end of unspecified humerus, initial encounter for closed fracture: Secondary | ICD-10-CM | POA: Diagnosis not present

## 2014-03-02 DIAGNOSIS — S42293A Other displaced fracture of upper end of unspecified humerus, initial encounter for closed fracture: Secondary | ICD-10-CM | POA: Diagnosis not present

## 2014-03-02 DIAGNOSIS — M25519 Pain in unspecified shoulder: Secondary | ICD-10-CM | POA: Diagnosis not present

## 2014-04-13 DIAGNOSIS — M25569 Pain in unspecified knee: Secondary | ICD-10-CM | POA: Diagnosis not present

## 2014-04-13 DIAGNOSIS — M25519 Pain in unspecified shoulder: Secondary | ICD-10-CM | POA: Diagnosis not present

## 2014-04-21 DIAGNOSIS — N182 Chronic kidney disease, stage 2 (mild): Secondary | ICD-10-CM | POA: Diagnosis not present

## 2014-04-28 DIAGNOSIS — N182 Chronic kidney disease, stage 2 (mild): Secondary | ICD-10-CM | POA: Diagnosis not present

## 2014-04-28 DIAGNOSIS — E785 Hyperlipidemia, unspecified: Secondary | ICD-10-CM | POA: Diagnosis not present

## 2014-05-26 DIAGNOSIS — M2042 Other hammer toe(s) (acquired), left foot: Secondary | ICD-10-CM | POA: Diagnosis not present

## 2014-05-26 DIAGNOSIS — D485 Neoplasm of uncertain behavior of skin: Secondary | ICD-10-CM | POA: Diagnosis not present

## 2014-08-01 DIAGNOSIS — I10 Essential (primary) hypertension: Secondary | ICD-10-CM | POA: Diagnosis not present

## 2014-08-01 DIAGNOSIS — E538 Deficiency of other specified B group vitamins: Secondary | ICD-10-CM | POA: Diagnosis not present

## 2014-08-01 DIAGNOSIS — R252 Cramp and spasm: Secondary | ICD-10-CM | POA: Diagnosis not present

## 2014-08-01 DIAGNOSIS — D72829 Elevated white blood cell count, unspecified: Secondary | ICD-10-CM | POA: Diagnosis not present

## 2014-08-01 DIAGNOSIS — E782 Mixed hyperlipidemia: Secondary | ICD-10-CM | POA: Diagnosis not present

## 2014-08-01 DIAGNOSIS — Z6841 Body Mass Index (BMI) 40.0 and over, adult: Secondary | ICD-10-CM | POA: Diagnosis not present

## 2014-08-01 DIAGNOSIS — M25569 Pain in unspecified knee: Secondary | ICD-10-CM | POA: Diagnosis not present

## 2014-09-27 DIAGNOSIS — Z6841 Body Mass Index (BMI) 40.0 and over, adult: Secondary | ICD-10-CM | POA: Diagnosis not present

## 2014-09-27 DIAGNOSIS — M10072 Idiopathic gout, left ankle and foot: Secondary | ICD-10-CM | POA: Diagnosis not present

## 2014-09-27 DIAGNOSIS — J069 Acute upper respiratory infection, unspecified: Secondary | ICD-10-CM | POA: Diagnosis not present

## 2014-09-27 DIAGNOSIS — E538 Deficiency of other specified B group vitamins: Secondary | ICD-10-CM | POA: Diagnosis not present

## 2014-10-18 DIAGNOSIS — Z1231 Encounter for screening mammogram for malignant neoplasm of breast: Secondary | ICD-10-CM | POA: Diagnosis not present

## 2014-10-18 DIAGNOSIS — Z803 Family history of malignant neoplasm of breast: Secondary | ICD-10-CM | POA: Diagnosis not present

## 2014-10-24 DIAGNOSIS — J310 Chronic rhinitis: Secondary | ICD-10-CM | POA: Diagnosis not present

## 2014-12-02 DIAGNOSIS — B372 Candidiasis of skin and nail: Secondary | ICD-10-CM | POA: Diagnosis not present

## 2014-12-02 DIAGNOSIS — Z6841 Body Mass Index (BMI) 40.0 and over, adult: Secondary | ICD-10-CM | POA: Diagnosis not present

## 2014-12-02 DIAGNOSIS — I10 Essential (primary) hypertension: Secondary | ICD-10-CM | POA: Diagnosis not present

## 2014-12-02 DIAGNOSIS — T7840XA Allergy, unspecified, initial encounter: Secondary | ICD-10-CM | POA: Diagnosis not present

## 2014-12-02 DIAGNOSIS — E782 Mixed hyperlipidemia: Secondary | ICD-10-CM | POA: Diagnosis not present

## 2015-01-04 DIAGNOSIS — M25512 Pain in left shoulder: Secondary | ICD-10-CM | POA: Diagnosis not present

## 2015-01-04 DIAGNOSIS — M222X1 Patellofemoral disorders, right knee: Secondary | ICD-10-CM | POA: Diagnosis not present

## 2015-01-04 DIAGNOSIS — M222X2 Patellofemoral disorders, left knee: Secondary | ICD-10-CM | POA: Diagnosis not present

## 2015-01-11 DIAGNOSIS — D485 Neoplasm of uncertain behavior of skin: Secondary | ICD-10-CM | POA: Diagnosis not present

## 2015-01-13 DIAGNOSIS — L821 Other seborrheic keratosis: Secondary | ICD-10-CM | POA: Diagnosis not present

## 2015-01-13 DIAGNOSIS — D485 Neoplasm of uncertain behavior of skin: Secondary | ICD-10-CM | POA: Diagnosis not present

## 2015-01-13 DIAGNOSIS — L570 Actinic keratosis: Secondary | ICD-10-CM | POA: Diagnosis not present

## 2015-02-07 DIAGNOSIS — B372 Candidiasis of skin and nail: Secondary | ICD-10-CM | POA: Diagnosis not present

## 2015-04-05 DIAGNOSIS — M222X2 Patellofemoral disorders, left knee: Secondary | ICD-10-CM | POA: Diagnosis not present

## 2015-04-05 DIAGNOSIS — M222X1 Patellofemoral disorders, right knee: Secondary | ICD-10-CM | POA: Diagnosis not present

## 2015-04-11 DIAGNOSIS — M25562 Pain in left knee: Secondary | ICD-10-CM | POA: Diagnosis not present

## 2015-04-11 DIAGNOSIS — S86111D Strain of other muscle(s) and tendon(s) of posterior muscle group at lower leg level, right leg, subsequent encounter: Secondary | ICD-10-CM | POA: Diagnosis not present

## 2015-04-11 DIAGNOSIS — M25561 Pain in right knee: Secondary | ICD-10-CM | POA: Diagnosis not present

## 2015-04-18 DIAGNOSIS — M25561 Pain in right knee: Secondary | ICD-10-CM | POA: Diagnosis not present

## 2015-04-18 DIAGNOSIS — M25562 Pain in left knee: Secondary | ICD-10-CM | POA: Diagnosis not present

## 2015-04-18 DIAGNOSIS — S86111D Strain of other muscle(s) and tendon(s) of posterior muscle group at lower leg level, right leg, subsequent encounter: Secondary | ICD-10-CM | POA: Diagnosis not present

## 2015-04-20 DIAGNOSIS — S86111D Strain of other muscle(s) and tendon(s) of posterior muscle group at lower leg level, right leg, subsequent encounter: Secondary | ICD-10-CM | POA: Diagnosis not present

## 2015-04-20 DIAGNOSIS — M25562 Pain in left knee: Secondary | ICD-10-CM | POA: Diagnosis not present

## 2015-04-20 DIAGNOSIS — M25561 Pain in right knee: Secondary | ICD-10-CM | POA: Diagnosis not present

## 2015-04-27 DIAGNOSIS — M25561 Pain in right knee: Secondary | ICD-10-CM | POA: Diagnosis not present

## 2015-04-27 DIAGNOSIS — S86111D Strain of other muscle(s) and tendon(s) of posterior muscle group at lower leg level, right leg, subsequent encounter: Secondary | ICD-10-CM | POA: Diagnosis not present

## 2015-04-27 DIAGNOSIS — M25562 Pain in left knee: Secondary | ICD-10-CM | POA: Diagnosis not present

## 2015-05-02 DIAGNOSIS — M25561 Pain in right knee: Secondary | ICD-10-CM | POA: Diagnosis not present

## 2015-05-02 DIAGNOSIS — M25562 Pain in left knee: Secondary | ICD-10-CM | POA: Diagnosis not present

## 2015-05-02 DIAGNOSIS — S86111D Strain of other muscle(s) and tendon(s) of posterior muscle group at lower leg level, right leg, subsequent encounter: Secondary | ICD-10-CM | POA: Diagnosis not present

## 2015-05-05 DIAGNOSIS — S86111D Strain of other muscle(s) and tendon(s) of posterior muscle group at lower leg level, right leg, subsequent encounter: Secondary | ICD-10-CM | POA: Diagnosis not present

## 2015-05-05 DIAGNOSIS — M25561 Pain in right knee: Secondary | ICD-10-CM | POA: Diagnosis not present

## 2015-05-05 DIAGNOSIS — M25562 Pain in left knee: Secondary | ICD-10-CM | POA: Diagnosis not present

## 2015-05-09 DIAGNOSIS — M25562 Pain in left knee: Secondary | ICD-10-CM | POA: Diagnosis not present

## 2015-05-09 DIAGNOSIS — S86111D Strain of other muscle(s) and tendon(s) of posterior muscle group at lower leg level, right leg, subsequent encounter: Secondary | ICD-10-CM | POA: Diagnosis not present

## 2015-05-09 DIAGNOSIS — M25561 Pain in right knee: Secondary | ICD-10-CM | POA: Diagnosis not present

## 2015-05-12 DIAGNOSIS — S86111D Strain of other muscle(s) and tendon(s) of posterior muscle group at lower leg level, right leg, subsequent encounter: Secondary | ICD-10-CM | POA: Diagnosis not present

## 2015-05-12 DIAGNOSIS — M25561 Pain in right knee: Secondary | ICD-10-CM | POA: Diagnosis not present

## 2015-05-12 DIAGNOSIS — M25562 Pain in left knee: Secondary | ICD-10-CM | POA: Diagnosis not present

## 2015-05-15 DIAGNOSIS — E538 Deficiency of other specified B group vitamins: Secondary | ICD-10-CM | POA: Diagnosis not present

## 2015-05-15 DIAGNOSIS — I10 Essential (primary) hypertension: Secondary | ICD-10-CM | POA: Diagnosis not present

## 2015-05-15 DIAGNOSIS — Z681 Body mass index (BMI) 19 or less, adult: Secondary | ICD-10-CM | POA: Diagnosis not present

## 2015-05-15 DIAGNOSIS — Z23 Encounter for immunization: Secondary | ICD-10-CM | POA: Diagnosis not present

## 2015-05-15 DIAGNOSIS — Z1389 Encounter for screening for other disorder: Secondary | ICD-10-CM | POA: Diagnosis not present

## 2015-05-15 DIAGNOSIS — F419 Anxiety disorder, unspecified: Secondary | ICD-10-CM | POA: Diagnosis not present

## 2015-05-16 DIAGNOSIS — S86111D Strain of other muscle(s) and tendon(s) of posterior muscle group at lower leg level, right leg, subsequent encounter: Secondary | ICD-10-CM | POA: Diagnosis not present

## 2015-05-16 DIAGNOSIS — M25562 Pain in left knee: Secondary | ICD-10-CM | POA: Diagnosis not present

## 2015-05-16 DIAGNOSIS — M25561 Pain in right knee: Secondary | ICD-10-CM | POA: Diagnosis not present

## 2015-05-18 DIAGNOSIS — S86111D Strain of other muscle(s) and tendon(s) of posterior muscle group at lower leg level, right leg, subsequent encounter: Secondary | ICD-10-CM | POA: Diagnosis not present

## 2015-05-18 DIAGNOSIS — M25561 Pain in right knee: Secondary | ICD-10-CM | POA: Diagnosis not present

## 2015-05-18 DIAGNOSIS — M25562 Pain in left knee: Secondary | ICD-10-CM | POA: Diagnosis not present

## 2015-05-30 DIAGNOSIS — M25561 Pain in right knee: Secondary | ICD-10-CM | POA: Diagnosis not present

## 2015-05-30 DIAGNOSIS — S86111D Strain of other muscle(s) and tendon(s) of posterior muscle group at lower leg level, right leg, subsequent encounter: Secondary | ICD-10-CM | POA: Diagnosis not present

## 2015-05-30 DIAGNOSIS — M25562 Pain in left knee: Secondary | ICD-10-CM | POA: Diagnosis not present

## 2015-06-06 DIAGNOSIS — S86111D Strain of other muscle(s) and tendon(s) of posterior muscle group at lower leg level, right leg, subsequent encounter: Secondary | ICD-10-CM | POA: Diagnosis not present

## 2015-06-06 DIAGNOSIS — M25562 Pain in left knee: Secondary | ICD-10-CM | POA: Diagnosis not present

## 2015-06-06 DIAGNOSIS — M25561 Pain in right knee: Secondary | ICD-10-CM | POA: Diagnosis not present

## 2015-06-08 DIAGNOSIS — S86111D Strain of other muscle(s) and tendon(s) of posterior muscle group at lower leg level, right leg, subsequent encounter: Secondary | ICD-10-CM | POA: Diagnosis not present

## 2015-06-08 DIAGNOSIS — M25562 Pain in left knee: Secondary | ICD-10-CM | POA: Diagnosis not present

## 2015-06-08 DIAGNOSIS — M25561 Pain in right knee: Secondary | ICD-10-CM | POA: Diagnosis not present

## 2015-06-13 DIAGNOSIS — S86111D Strain of other muscle(s) and tendon(s) of posterior muscle group at lower leg level, right leg, subsequent encounter: Secondary | ICD-10-CM | POA: Diagnosis not present

## 2015-06-13 DIAGNOSIS — M25562 Pain in left knee: Secondary | ICD-10-CM | POA: Diagnosis not present

## 2015-06-13 DIAGNOSIS — M25561 Pain in right knee: Secondary | ICD-10-CM | POA: Diagnosis not present

## 2015-06-15 DIAGNOSIS — M25562 Pain in left knee: Secondary | ICD-10-CM | POA: Diagnosis not present

## 2015-06-15 DIAGNOSIS — M25561 Pain in right knee: Secondary | ICD-10-CM | POA: Diagnosis not present

## 2015-06-15 DIAGNOSIS — S86111D Strain of other muscle(s) and tendon(s) of posterior muscle group at lower leg level, right leg, subsequent encounter: Secondary | ICD-10-CM | POA: Diagnosis not present

## 2015-06-20 DIAGNOSIS — M25562 Pain in left knee: Secondary | ICD-10-CM | POA: Diagnosis not present

## 2015-06-20 DIAGNOSIS — M25561 Pain in right knee: Secondary | ICD-10-CM | POA: Diagnosis not present

## 2015-06-20 DIAGNOSIS — S86111D Strain of other muscle(s) and tendon(s) of posterior muscle group at lower leg level, right leg, subsequent encounter: Secondary | ICD-10-CM | POA: Diagnosis not present

## 2015-06-22 DIAGNOSIS — S86111D Strain of other muscle(s) and tendon(s) of posterior muscle group at lower leg level, right leg, subsequent encounter: Secondary | ICD-10-CM | POA: Diagnosis not present

## 2015-06-22 DIAGNOSIS — M25561 Pain in right knee: Secondary | ICD-10-CM | POA: Diagnosis not present

## 2015-06-22 DIAGNOSIS — M25562 Pain in left knee: Secondary | ICD-10-CM | POA: Diagnosis not present

## 2015-06-27 DIAGNOSIS — M25561 Pain in right knee: Secondary | ICD-10-CM | POA: Diagnosis not present

## 2015-06-27 DIAGNOSIS — M25562 Pain in left knee: Secondary | ICD-10-CM | POA: Diagnosis not present

## 2015-06-27 DIAGNOSIS — S86111D Strain of other muscle(s) and tendon(s) of posterior muscle group at lower leg level, right leg, subsequent encounter: Secondary | ICD-10-CM | POA: Diagnosis not present

## 2015-07-04 DIAGNOSIS — S86111D Strain of other muscle(s) and tendon(s) of posterior muscle group at lower leg level, right leg, subsequent encounter: Secondary | ICD-10-CM | POA: Diagnosis not present

## 2015-07-04 DIAGNOSIS — M25561 Pain in right knee: Secondary | ICD-10-CM | POA: Diagnosis not present

## 2015-07-04 DIAGNOSIS — M25562 Pain in left knee: Secondary | ICD-10-CM | POA: Diagnosis not present

## 2015-08-02 DIAGNOSIS — Z01411 Encounter for gynecological examination (general) (routine) with abnormal findings: Secondary | ICD-10-CM | POA: Diagnosis not present

## 2015-08-02 DIAGNOSIS — R102 Pelvic and perineal pain: Secondary | ICD-10-CM | POA: Diagnosis not present

## 2015-09-07 DIAGNOSIS — F329 Major depressive disorder, single episode, unspecified: Secondary | ICD-10-CM | POA: Diagnosis not present

## 2015-09-07 DIAGNOSIS — Z6841 Body Mass Index (BMI) 40.0 and over, adult: Secondary | ICD-10-CM | POA: Diagnosis not present

## 2015-09-07 DIAGNOSIS — Z0001 Encounter for general adult medical examination with abnormal findings: Secondary | ICD-10-CM | POA: Diagnosis not present

## 2015-09-07 DIAGNOSIS — E039 Hypothyroidism, unspecified: Secondary | ICD-10-CM | POA: Diagnosis not present

## 2015-09-07 DIAGNOSIS — I10 Essential (primary) hypertension: Secondary | ICD-10-CM | POA: Diagnosis not present

## 2015-09-07 DIAGNOSIS — E538 Deficiency of other specified B group vitamins: Secondary | ICD-10-CM | POA: Diagnosis not present

## 2015-09-07 DIAGNOSIS — E559 Vitamin D deficiency, unspecified: Secondary | ICD-10-CM | POA: Diagnosis not present

## 2015-09-07 DIAGNOSIS — F419 Anxiety disorder, unspecified: Secondary | ICD-10-CM | POA: Diagnosis not present

## 2015-09-07 DIAGNOSIS — Z1389 Encounter for screening for other disorder: Secondary | ICD-10-CM | POA: Diagnosis not present

## 2016-04-18 DIAGNOSIS — Z1231 Encounter for screening mammogram for malignant neoplasm of breast: Secondary | ICD-10-CM | POA: Diagnosis not present

## 2016-04-18 DIAGNOSIS — Z803 Family history of malignant neoplasm of breast: Secondary | ICD-10-CM | POA: Diagnosis not present

## 2016-04-19 DIAGNOSIS — N189 Chronic kidney disease, unspecified: Secondary | ICD-10-CM | POA: Diagnosis not present

## 2016-04-19 DIAGNOSIS — E538 Deficiency of other specified B group vitamins: Secondary | ICD-10-CM | POA: Diagnosis not present

## 2016-04-19 DIAGNOSIS — Z681 Body mass index (BMI) 19 or less, adult: Secondary | ICD-10-CM | POA: Diagnosis not present

## 2016-04-19 DIAGNOSIS — Z1389 Encounter for screening for other disorder: Secondary | ICD-10-CM | POA: Diagnosis not present

## 2016-04-19 DIAGNOSIS — F419 Anxiety disorder, unspecified: Secondary | ICD-10-CM | POA: Diagnosis not present

## 2016-04-19 DIAGNOSIS — R5383 Other fatigue: Secondary | ICD-10-CM | POA: Diagnosis not present

## 2016-04-19 DIAGNOSIS — M47816 Spondylosis without myelopathy or radiculopathy, lumbar region: Secondary | ICD-10-CM | POA: Diagnosis not present

## 2016-04-19 DIAGNOSIS — N182 Chronic kidney disease, stage 2 (mild): Secondary | ICD-10-CM | POA: Diagnosis not present

## 2016-05-02 DIAGNOSIS — H2511 Age-related nuclear cataract, right eye: Secondary | ICD-10-CM | POA: Diagnosis not present

## 2016-05-02 DIAGNOSIS — H35031 Hypertensive retinopathy, right eye: Secondary | ICD-10-CM | POA: Diagnosis not present

## 2016-05-02 DIAGNOSIS — H35032 Hypertensive retinopathy, left eye: Secondary | ICD-10-CM | POA: Diagnosis not present

## 2016-05-02 DIAGNOSIS — H25011 Cortical age-related cataract, right eye: Secondary | ICD-10-CM | POA: Diagnosis not present

## 2016-05-02 DIAGNOSIS — H25012 Cortical age-related cataract, left eye: Secondary | ICD-10-CM | POA: Diagnosis not present

## 2016-05-02 DIAGNOSIS — H21233 Degeneration of iris (pigmentary), bilateral: Secondary | ICD-10-CM | POA: Diagnosis not present

## 2016-05-02 DIAGNOSIS — H353112 Nonexudative age-related macular degeneration, right eye, intermediate dry stage: Secondary | ICD-10-CM | POA: Diagnosis not present

## 2016-05-02 DIAGNOSIS — H353122 Nonexudative age-related macular degeneration, left eye, intermediate dry stage: Secondary | ICD-10-CM | POA: Diagnosis not present

## 2016-09-25 DIAGNOSIS — F419 Anxiety disorder, unspecified: Secondary | ICD-10-CM | POA: Diagnosis not present

## 2016-09-25 DIAGNOSIS — Z0000 Encounter for general adult medical examination without abnormal findings: Secondary | ICD-10-CM | POA: Diagnosis not present

## 2016-09-25 DIAGNOSIS — E782 Mixed hyperlipidemia: Secondary | ICD-10-CM | POA: Diagnosis not present

## 2016-09-25 DIAGNOSIS — E559 Vitamin D deficiency, unspecified: Secondary | ICD-10-CM | POA: Diagnosis not present

## 2016-09-25 DIAGNOSIS — Z681 Body mass index (BMI) 19 or less, adult: Secondary | ICD-10-CM | POA: Diagnosis not present

## 2016-09-25 DIAGNOSIS — M1000 Idiopathic gout, unspecified site: Secondary | ICD-10-CM | POA: Diagnosis not present

## 2016-09-25 DIAGNOSIS — R5383 Other fatigue: Secondary | ICD-10-CM | POA: Diagnosis not present

## 2016-09-25 DIAGNOSIS — R6889 Other general symptoms and signs: Secondary | ICD-10-CM | POA: Diagnosis not present

## 2016-09-25 DIAGNOSIS — M109 Gout, unspecified: Secondary | ICD-10-CM | POA: Diagnosis not present

## 2016-10-22 DIAGNOSIS — I129 Hypertensive chronic kidney disease with stage 1 through stage 4 chronic kidney disease, or unspecified chronic kidney disease: Secondary | ICD-10-CM | POA: Diagnosis not present

## 2016-10-22 DIAGNOSIS — E669 Obesity, unspecified: Secondary | ICD-10-CM | POA: Diagnosis not present

## 2016-10-22 DIAGNOSIS — Z720 Tobacco use: Secondary | ICD-10-CM | POA: Diagnosis not present

## 2016-10-22 DIAGNOSIS — M109 Gout, unspecified: Secondary | ICD-10-CM | POA: Diagnosis not present

## 2016-10-22 DIAGNOSIS — N183 Chronic kidney disease, stage 3 (moderate): Secondary | ICD-10-CM | POA: Diagnosis not present

## 2016-10-22 DIAGNOSIS — N2589 Other disorders resulting from impaired renal tubular function: Secondary | ICD-10-CM | POA: Diagnosis not present

## 2016-10-22 DIAGNOSIS — F418 Other specified anxiety disorders: Secondary | ICD-10-CM | POA: Diagnosis not present

## 2016-10-22 DIAGNOSIS — N390 Urinary tract infection, site not specified: Secondary | ICD-10-CM | POA: Diagnosis not present

## 2016-10-29 ENCOUNTER — Other Ambulatory Visit: Payer: Self-pay | Admitting: Nephrology

## 2016-10-29 DIAGNOSIS — N183 Chronic kidney disease, stage 3 unspecified: Secondary | ICD-10-CM

## 2016-11-01 ENCOUNTER — Ambulatory Visit
Admission: RE | Admit: 2016-11-01 | Discharge: 2016-11-01 | Disposition: A | Discharge status: Home / Self Care | Payer: Medicare Other | Source: Ambulatory Visit | Attending: Nephrology | Admitting: Nephrology

## 2016-11-01 DIAGNOSIS — N183 Chronic kidney disease, stage 3 unspecified: Secondary | ICD-10-CM

## 2016-11-11 DIAGNOSIS — Z720 Tobacco use: Secondary | ICD-10-CM | POA: Diagnosis not present

## 2016-11-11 DIAGNOSIS — N2589 Other disorders resulting from impaired renal tubular function: Secondary | ICD-10-CM | POA: Diagnosis not present

## 2016-11-11 DIAGNOSIS — M109 Gout, unspecified: Secondary | ICD-10-CM | POA: Diagnosis not present

## 2016-11-11 DIAGNOSIS — N183 Chronic kidney disease, stage 3 (moderate): Secondary | ICD-10-CM | POA: Diagnosis not present

## 2016-11-11 DIAGNOSIS — E669 Obesity, unspecified: Secondary | ICD-10-CM | POA: Diagnosis not present

## 2016-11-11 DIAGNOSIS — F418 Other specified anxiety disorders: Secondary | ICD-10-CM | POA: Diagnosis not present

## 2016-11-11 DIAGNOSIS — I129 Hypertensive chronic kidney disease with stage 1 through stage 4 chronic kidney disease, or unspecified chronic kidney disease: Secondary | ICD-10-CM | POA: Diagnosis not present

## 2016-11-11 DIAGNOSIS — E785 Hyperlipidemia, unspecified: Secondary | ICD-10-CM | POA: Diagnosis not present

## 2016-12-31 DIAGNOSIS — F419 Anxiety disorder, unspecified: Secondary | ICD-10-CM | POA: Diagnosis not present

## 2016-12-31 DIAGNOSIS — Z719 Counseling, unspecified: Secondary | ICD-10-CM | POA: Diagnosis not present

## 2016-12-31 DIAGNOSIS — I5031 Acute diastolic (congestive) heart failure: Secondary | ICD-10-CM | POA: Diagnosis not present

## 2016-12-31 DIAGNOSIS — Z6841 Body Mass Index (BMI) 40.0 and over, adult: Secondary | ICD-10-CM | POA: Diagnosis not present

## 2016-12-31 DIAGNOSIS — I10 Essential (primary) hypertension: Secondary | ICD-10-CM | POA: Diagnosis not present

## 2016-12-31 DIAGNOSIS — R05 Cough: Secondary | ICD-10-CM | POA: Diagnosis not present

## 2016-12-31 DIAGNOSIS — J302 Other seasonal allergic rhinitis: Secondary | ICD-10-CM | POA: Diagnosis not present

## 2016-12-31 DIAGNOSIS — J811 Chronic pulmonary edema: Secondary | ICD-10-CM | POA: Diagnosis not present

## 2016-12-31 DIAGNOSIS — R062 Wheezing: Secondary | ICD-10-CM | POA: Diagnosis not present

## 2017-01-31 DIAGNOSIS — M1712 Unilateral primary osteoarthritis, left knee: Secondary | ICD-10-CM | POA: Diagnosis not present

## 2017-01-31 DIAGNOSIS — M19112 Post-traumatic osteoarthritis, left shoulder: Secondary | ICD-10-CM | POA: Diagnosis not present

## 2017-01-31 DIAGNOSIS — M1711 Unilateral primary osteoarthritis, right knee: Secondary | ICD-10-CM | POA: Diagnosis not present

## 2017-03-27 DIAGNOSIS — M549 Dorsalgia, unspecified: Secondary | ICD-10-CM | POA: Diagnosis not present

## 2017-03-27 DIAGNOSIS — Z0001 Encounter for general adult medical examination with abnormal findings: Secondary | ICD-10-CM | POA: Diagnosis not present

## 2017-03-27 DIAGNOSIS — E538 Deficiency of other specified B group vitamins: Secondary | ICD-10-CM | POA: Diagnosis not present

## 2017-03-27 DIAGNOSIS — F330 Major depressive disorder, recurrent, mild: Secondary | ICD-10-CM | POA: Diagnosis not present

## 2017-03-27 DIAGNOSIS — F329 Major depressive disorder, single episode, unspecified: Secondary | ICD-10-CM | POA: Diagnosis not present

## 2017-03-27 DIAGNOSIS — M47816 Spondylosis without myelopathy or radiculopathy, lumbar region: Secondary | ICD-10-CM | POA: Diagnosis not present

## 2017-03-27 DIAGNOSIS — Z1389 Encounter for screening for other disorder: Secondary | ICD-10-CM | POA: Diagnosis not present

## 2017-03-27 DIAGNOSIS — Z6841 Body Mass Index (BMI) 40.0 and over, adult: Secondary | ICD-10-CM | POA: Diagnosis not present

## 2017-03-27 DIAGNOSIS — I10 Essential (primary) hypertension: Secondary | ICD-10-CM | POA: Diagnosis not present

## 2017-03-28 ENCOUNTER — Other Ambulatory Visit (HOSPITAL_COMMUNITY): Payer: Self-pay | Admitting: Internal Medicine

## 2017-03-28 DIAGNOSIS — E2839 Other primary ovarian failure: Secondary | ICD-10-CM

## 2017-04-19 DIAGNOSIS — Z1211 Encounter for screening for malignant neoplasm of colon: Secondary | ICD-10-CM | POA: Diagnosis not present

## 2017-04-29 DIAGNOSIS — Z803 Family history of malignant neoplasm of breast: Secondary | ICD-10-CM | POA: Diagnosis not present

## 2017-04-29 DIAGNOSIS — Z1231 Encounter for screening mammogram for malignant neoplasm of breast: Secondary | ICD-10-CM | POA: Diagnosis not present

## 2017-06-14 DIAGNOSIS — M1711 Unilateral primary osteoarthritis, right knee: Secondary | ICD-10-CM | POA: Diagnosis not present

## 2017-06-14 DIAGNOSIS — M67912 Unspecified disorder of synovium and tendon, left shoulder: Secondary | ICD-10-CM | POA: Diagnosis not present

## 2017-06-14 DIAGNOSIS — M1712 Unilateral primary osteoarthritis, left knee: Secondary | ICD-10-CM | POA: Diagnosis not present

## 2017-07-07 DIAGNOSIS — F419 Anxiety disorder, unspecified: Secondary | ICD-10-CM | POA: Diagnosis not present

## 2017-07-07 DIAGNOSIS — Z681 Body mass index (BMI) 19 or less, adult: Secondary | ICD-10-CM | POA: Diagnosis not present

## 2017-07-07 DIAGNOSIS — F329 Major depressive disorder, single episode, unspecified: Secondary | ICD-10-CM | POA: Diagnosis not present

## 2017-07-07 DIAGNOSIS — E669 Obesity, unspecified: Secondary | ICD-10-CM | POA: Diagnosis not present

## 2017-07-07 DIAGNOSIS — F330 Major depressive disorder, recurrent, mild: Secondary | ICD-10-CM | POA: Diagnosis not present

## 2017-07-07 DIAGNOSIS — E782 Mixed hyperlipidemia: Secondary | ICD-10-CM | POA: Diagnosis not present

## 2017-07-07 DIAGNOSIS — I10 Essential (primary) hypertension: Secondary | ICD-10-CM | POA: Diagnosis not present

## 2017-07-15 DIAGNOSIS — Z79899 Other long term (current) drug therapy: Secondary | ICD-10-CM | POA: Diagnosis not present

## 2017-07-15 DIAGNOSIS — Z681 Body mass index (BMI) 19 or less, adult: Secondary | ICD-10-CM | POA: Diagnosis not present

## 2017-07-15 DIAGNOSIS — Z23 Encounter for immunization: Secondary | ICD-10-CM | POA: Diagnosis not present

## 2017-07-15 DIAGNOSIS — E669 Obesity, unspecified: Secondary | ICD-10-CM | POA: Diagnosis not present

## 2017-07-15 DIAGNOSIS — E2839 Other primary ovarian failure: Secondary | ICD-10-CM | POA: Diagnosis not present

## 2017-07-15 DIAGNOSIS — E782 Mixed hyperlipidemia: Secondary | ICD-10-CM | POA: Diagnosis not present

## 2017-07-15 DIAGNOSIS — I10 Essential (primary) hypertension: Secondary | ICD-10-CM | POA: Diagnosis not present

## 2017-07-15 DIAGNOSIS — F330 Major depressive disorder, recurrent, mild: Secondary | ICD-10-CM | POA: Diagnosis not present

## 2017-08-07 DIAGNOSIS — M109 Gout, unspecified: Secondary | ICD-10-CM | POA: Diagnosis not present

## 2017-08-07 DIAGNOSIS — I129 Hypertensive chronic kidney disease with stage 1 through stage 4 chronic kidney disease, or unspecified chronic kidney disease: Secondary | ICD-10-CM | POA: Diagnosis not present

## 2017-08-07 DIAGNOSIS — N760 Acute vaginitis: Secondary | ICD-10-CM | POA: Diagnosis not present

## 2017-08-07 DIAGNOSIS — N183 Chronic kidney disease, stage 3 (moderate): Secondary | ICD-10-CM | POA: Diagnosis not present

## 2017-08-15 ENCOUNTER — Other Ambulatory Visit: Payer: Medicare Other

## 2017-09-24 DIAGNOSIS — M21611 Bunion of right foot: Secondary | ICD-10-CM | POA: Diagnosis not present

## 2017-09-24 DIAGNOSIS — M2042 Other hammer toe(s) (acquired), left foot: Secondary | ICD-10-CM | POA: Diagnosis not present

## 2017-09-24 DIAGNOSIS — M21612 Bunion of left foot: Secondary | ICD-10-CM | POA: Diagnosis not present

## 2017-09-24 DIAGNOSIS — M2041 Other hammer toe(s) (acquired), right foot: Secondary | ICD-10-CM | POA: Diagnosis not present

## 2017-10-30 DIAGNOSIS — G629 Polyneuropathy, unspecified: Secondary | ICD-10-CM | POA: Diagnosis not present

## 2017-10-30 DIAGNOSIS — D485 Neoplasm of uncertain behavior of skin: Secondary | ICD-10-CM | POA: Diagnosis not present

## 2017-10-30 DIAGNOSIS — M21612 Bunion of left foot: Secondary | ICD-10-CM | POA: Diagnosis not present

## 2017-10-30 DIAGNOSIS — M21611 Bunion of right foot: Secondary | ICD-10-CM | POA: Diagnosis not present

## 2017-11-07 DIAGNOSIS — R202 Paresthesia of skin: Secondary | ICD-10-CM | POA: Diagnosis not present

## 2018-02-26 DIAGNOSIS — S92502A Displaced unspecified fracture of left lesser toe(s), initial encounter for closed fracture: Secondary | ICD-10-CM | POA: Diagnosis not present

## 2018-02-26 DIAGNOSIS — M79672 Pain in left foot: Secondary | ICD-10-CM | POA: Diagnosis not present

## 2018-04-09 DIAGNOSIS — E538 Deficiency of other specified B group vitamins: Secondary | ICD-10-CM | POA: Diagnosis not present

## 2018-04-09 DIAGNOSIS — M549 Dorsalgia, unspecified: Secondary | ICD-10-CM | POA: Diagnosis not present

## 2018-04-09 DIAGNOSIS — I10 Essential (primary) hypertension: Secondary | ICD-10-CM | POA: Diagnosis not present

## 2018-04-09 DIAGNOSIS — Z0000 Encounter for general adult medical examination without abnormal findings: Secondary | ICD-10-CM | POA: Diagnosis not present

## 2018-04-09 DIAGNOSIS — Z1389 Encounter for screening for other disorder: Secondary | ICD-10-CM | POA: Diagnosis not present

## 2018-04-09 DIAGNOSIS — Z681 Body mass index (BMI) 19 or less, adult: Secondary | ICD-10-CM | POA: Diagnosis not present

## 2018-04-09 DIAGNOSIS — E669 Obesity, unspecified: Secondary | ICD-10-CM | POA: Diagnosis not present

## 2018-04-09 DIAGNOSIS — M47811 Spondylosis without myelopathy or radiculopathy, occipito-atlanto-axial region: Secondary | ICD-10-CM | POA: Diagnosis not present

## 2018-05-21 DIAGNOSIS — S92502K Displaced unspecified fracture of left lesser toe(s), subsequent encounter for fracture with nonunion: Secondary | ICD-10-CM | POA: Diagnosis not present

## 2018-05-21 DIAGNOSIS — M79671 Pain in right foot: Secondary | ICD-10-CM | POA: Diagnosis not present

## 2018-07-14 DIAGNOSIS — R05 Cough: Secondary | ICD-10-CM | POA: Diagnosis not present

## 2018-07-14 DIAGNOSIS — Z681 Body mass index (BMI) 19 or less, adult: Secondary | ICD-10-CM | POA: Diagnosis not present

## 2018-07-14 DIAGNOSIS — R0981 Nasal congestion: Secondary | ICD-10-CM | POA: Diagnosis not present

## 2018-07-14 DIAGNOSIS — Z1389 Encounter for screening for other disorder: Secondary | ICD-10-CM | POA: Diagnosis not present

## 2018-09-15 DIAGNOSIS — Z01411 Encounter for gynecological examination (general) (routine) with abnormal findings: Secondary | ICD-10-CM | POA: Diagnosis not present

## 2018-11-04 DIAGNOSIS — J302 Other seasonal allergic rhinitis: Secondary | ICD-10-CM | POA: Diagnosis not present

## 2018-11-04 DIAGNOSIS — F419 Anxiety disorder, unspecified: Secondary | ICD-10-CM | POA: Diagnosis not present

## 2018-11-04 DIAGNOSIS — M1A00X0 Idiopathic chronic gout, unspecified site, without tophus (tophi): Secondary | ICD-10-CM | POA: Diagnosis not present

## 2018-11-04 DIAGNOSIS — I10 Essential (primary) hypertension: Secondary | ICD-10-CM | POA: Diagnosis not present

## 2018-11-04 DIAGNOSIS — Z1389 Encounter for screening for other disorder: Secondary | ICD-10-CM | POA: Diagnosis not present

## 2018-11-04 DIAGNOSIS — Z6841 Body Mass Index (BMI) 40.0 and over, adult: Secondary | ICD-10-CM | POA: Diagnosis not present

## 2019-01-18 DIAGNOSIS — H9202 Otalgia, left ear: Secondary | ICD-10-CM | POA: Diagnosis not present

## 2019-01-18 DIAGNOSIS — L739 Follicular disorder, unspecified: Secondary | ICD-10-CM | POA: Diagnosis not present

## 2019-05-13 DIAGNOSIS — F329 Major depressive disorder, single episode, unspecified: Secondary | ICD-10-CM | POA: Diagnosis not present

## 2019-05-13 DIAGNOSIS — Z0001 Encounter for general adult medical examination with abnormal findings: Secondary | ICD-10-CM | POA: Diagnosis not present

## 2019-05-13 DIAGNOSIS — Z6841 Body Mass Index (BMI) 40.0 and over, adult: Secondary | ICD-10-CM | POA: Diagnosis not present

## 2019-05-13 DIAGNOSIS — I10 Essential (primary) hypertension: Secondary | ICD-10-CM | POA: Diagnosis not present

## 2019-05-13 DIAGNOSIS — F419 Anxiety disorder, unspecified: Secondary | ICD-10-CM | POA: Diagnosis not present

## 2019-05-19 ENCOUNTER — Other Ambulatory Visit: Payer: Self-pay | Admitting: Internal Medicine

## 2019-05-19 DIAGNOSIS — E2839 Other primary ovarian failure: Secondary | ICD-10-CM

## 2019-05-19 DIAGNOSIS — Z1231 Encounter for screening mammogram for malignant neoplasm of breast: Secondary | ICD-10-CM

## 2019-07-29 DIAGNOSIS — S1096XA Insect bite of unspecified part of neck, initial encounter: Secondary | ICD-10-CM | POA: Diagnosis not present

## 2019-07-29 DIAGNOSIS — W57XXXA Bitten or stung by nonvenomous insect and other nonvenomous arthropods, initial encounter: Secondary | ICD-10-CM | POA: Diagnosis not present

## 2019-07-29 DIAGNOSIS — Z23 Encounter for immunization: Secondary | ICD-10-CM | POA: Diagnosis not present

## 2019-07-29 DIAGNOSIS — I10 Essential (primary) hypertension: Secondary | ICD-10-CM | POA: Diagnosis not present

## 2019-07-29 DIAGNOSIS — L089 Local infection of the skin and subcutaneous tissue, unspecified: Secondary | ICD-10-CM | POA: Diagnosis not present

## 2019-09-07 ENCOUNTER — Ambulatory Visit: Payer: Medicare Other | Attending: Internal Medicine

## 2019-09-07 DIAGNOSIS — Z23 Encounter for immunization: Secondary | ICD-10-CM | POA: Diagnosis not present

## 2019-09-07 NOTE — Progress Notes (Signed)
   Covid-19 Vaccination Clinic  Name:  Brittany Jackson    MRN: [id number] DOB: Oct 24, 1940  09/07/2019  [name] was observed post Covid-19 immunization for 15 minutes without incidence. She was provided with Vaccine Information Sheet and instruction to access the V-Safe system.   [name] was instructed to call 911 with any severe reactions post vaccine: Marland Kitchen Difficulty breathing  . Swelling of your face and throat  . A fast heartbeat  . A bad rash all over your body  . Dizziness and weakness    Immunizations Administered    Name Date Dose VIS Date Route   Pfizer COVID-19 Vaccine 09/07/2019  1:42 PM 0.3 mL 07/30/2019 Intramuscular   Manufacturer: [place]   Lot: [id number]   [place]: [address]-2

## 2019-09-28 ENCOUNTER — Ambulatory Visit: Payer: Medicare Other | Attending: Internal Medicine

## 2019-09-28 ENCOUNTER — Ambulatory Visit: Payer: Medicare Other

## 2019-09-28 DIAGNOSIS — Z23 Encounter for immunization: Secondary | ICD-10-CM | POA: Insufficient documentation

## 2019-09-28 NOTE — Progress Notes (Signed)
   Covid-19 Vaccination Clinic  Name:  Brittany Jackson    MRN: [id number] DOB: 27-Aug-1940  09/28/2019  [name] was observed post Covid-19 immunization for 30 minutes based on pre-vaccination screening without incidence. She was provided with Vaccine Information Sheet and instruction to access the V-Safe system.   [name] was instructed to call 911 with any severe reactions post vaccine: Marland Kitchen Difficulty breathing  . Swelling of your face and throat  . A fast heartbeat  . A bad rash all over your body  . Dizziness and weakness    Immunizations Administered    Name Date Dose VIS Date Route   Pfizer COVID-19 Vaccine 09/28/2019  1:10 PM 0.3 mL 07/30/2019 Intramuscular   Manufacturer: [place]   Lot: [id number]   [place]: [id number]

## 2019-10-06 DIAGNOSIS — M25522 Pain in left elbow: Secondary | ICD-10-CM | POA: Diagnosis not present

## 2019-10-28 DIAGNOSIS — Z8249 Family history of ischemic heart disease and other diseases of the circulatory system: Secondary | ICD-10-CM | POA: Diagnosis not present

## 2019-10-28 DIAGNOSIS — I10 Essential (primary) hypertension: Secondary | ICD-10-CM | POA: Diagnosis not present

## 2019-10-28 DIAGNOSIS — Z7901 Long term (current) use of anticoagulants: Secondary | ICD-10-CM | POA: Diagnosis not present

## 2019-10-28 DIAGNOSIS — E785 Hyperlipidemia, unspecified: Secondary | ICD-10-CM | POA: Diagnosis not present

## 2019-11-10 DIAGNOSIS — M25562 Pain in left knee: Secondary | ICD-10-CM | POA: Diagnosis not present

## 2019-11-11 DIAGNOSIS — M85852 Other specified disorders of bone density and structure, left thigh: Secondary | ICD-10-CM | POA: Diagnosis not present

## 2019-11-11 DIAGNOSIS — M85851 Other specified disorders of bone density and structure, right thigh: Secondary | ICD-10-CM | POA: Diagnosis not present

## 2019-11-11 DIAGNOSIS — Z1231 Encounter for screening mammogram for malignant neoplasm of breast: Secondary | ICD-10-CM | POA: Diagnosis not present

## 2019-11-16 DIAGNOSIS — T63304A Toxic effect of unspecified spider venom, undetermined, initial encounter: Secondary | ICD-10-CM | POA: Diagnosis not present

## 2019-11-17 DIAGNOSIS — H02845 Edema of left lower eyelid: Secondary | ICD-10-CM | POA: Diagnosis not present

## 2019-11-25 DIAGNOSIS — H524 Presbyopia: Secondary | ICD-10-CM | POA: Diagnosis not present

## 2019-11-25 DIAGNOSIS — H02845 Edema of left lower eyelid: Secondary | ICD-10-CM | POA: Diagnosis not present

## 2019-12-20 DIAGNOSIS — S83422D Sprain of lateral collateral ligament of left knee, subsequent encounter: Secondary | ICD-10-CM | POA: Diagnosis not present

## 2019-12-20 DIAGNOSIS — M1712 Unilateral primary osteoarthritis, left knee: Secondary | ICD-10-CM | POA: Diagnosis not present

## 2019-12-27 DIAGNOSIS — S83422D Sprain of lateral collateral ligament of left knee, subsequent encounter: Secondary | ICD-10-CM | POA: Diagnosis not present

## 2019-12-27 DIAGNOSIS — M1712 Unilateral primary osteoarthritis, left knee: Secondary | ICD-10-CM | POA: Diagnosis not present

## 2019-12-29 DIAGNOSIS — M1712 Unilateral primary osteoarthritis, left knee: Secondary | ICD-10-CM | POA: Diagnosis not present

## 2019-12-29 DIAGNOSIS — S83422D Sprain of lateral collateral ligament of left knee, subsequent encounter: Secondary | ICD-10-CM | POA: Diagnosis not present

## 2020-01-04 DIAGNOSIS — M1712 Unilateral primary osteoarthritis, left knee: Secondary | ICD-10-CM | POA: Diagnosis not present

## 2020-01-04 DIAGNOSIS — S83422D Sprain of lateral collateral ligament of left knee, subsequent encounter: Secondary | ICD-10-CM | POA: Diagnosis not present

## 2020-01-07 DIAGNOSIS — M25562 Pain in left knee: Secondary | ICD-10-CM | POA: Diagnosis not present

## 2020-03-05 DIAGNOSIS — L2389 Allergic contact dermatitis due to other agents: Secondary | ICD-10-CM | POA: Diagnosis not present

## 2020-03-05 DIAGNOSIS — I10 Essential (primary) hypertension: Secondary | ICD-10-CM | POA: Diagnosis not present

## 2020-03-17 DIAGNOSIS — L509 Urticaria, unspecified: Secondary | ICD-10-CM | POA: Diagnosis not present

## 2020-03-17 DIAGNOSIS — E7849 Other hyperlipidemia: Secondary | ICD-10-CM | POA: Diagnosis not present

## 2020-03-17 DIAGNOSIS — E785 Hyperlipidemia, unspecified: Secondary | ICD-10-CM | POA: Diagnosis not present

## 2020-03-17 DIAGNOSIS — L508 Other urticaria: Secondary | ICD-10-CM | POA: Diagnosis not present

## 2020-03-17 DIAGNOSIS — Z79899 Other long term (current) drug therapy: Secondary | ICD-10-CM | POA: Diagnosis not present

## 2020-03-17 DIAGNOSIS — Z681 Body mass index (BMI) 19 or less, adult: Secondary | ICD-10-CM | POA: Diagnosis not present

## 2020-03-17 DIAGNOSIS — I10 Essential (primary) hypertension: Secondary | ICD-10-CM | POA: Diagnosis not present

## 2020-03-17 DIAGNOSIS — E669 Obesity, unspecified: Secondary | ICD-10-CM | POA: Diagnosis not present

## 2020-03-20 DIAGNOSIS — D72829 Elevated white blood cell count, unspecified: Secondary | ICD-10-CM | POA: Diagnosis not present

## 2020-04-04 ENCOUNTER — Ambulatory Visit (INDEPENDENT_AMBULATORY_CARE_PROVIDER_SITE_OTHER): Payer: Medicare Other | Admitting: Allergy and Immunology

## 2020-04-04 ENCOUNTER — Other Ambulatory Visit: Payer: Self-pay

## 2020-04-04 ENCOUNTER — Encounter: Payer: Self-pay | Admitting: Allergy and Immunology

## 2020-04-04 VITALS — BP 118/74 | HR 84 | Temp 98.3°F | Resp 16 | Ht 66.5 in | Wt 251.8 lb

## 2020-04-04 DIAGNOSIS — L500 Allergic urticaria: Secondary | ICD-10-CM

## 2020-04-04 DIAGNOSIS — M941 Relapsing polychondritis: Secondary | ICD-10-CM

## 2020-04-04 DIAGNOSIS — D7219 Other eosinophilia: Secondary | ICD-10-CM | POA: Diagnosis not present

## 2020-04-04 DIAGNOSIS — L308 Other specified dermatitis: Secondary | ICD-10-CM | POA: Diagnosis not present

## 2020-04-04 DIAGNOSIS — L989 Disorder of the skin and subcutaneous tissue, unspecified: Secondary | ICD-10-CM

## 2020-04-04 MED ORDER — MONTELUKAST SODIUM 10 MG PO TABS: 10.0000 mg | ORAL_TABLET | Freq: Every day | ORAL | 1 refills | Status: DC

## 2020-04-04 MED ORDER — FAMOTIDINE 20 MG PO TABS: 20.0000 mg | ORAL_TABLET | Freq: Two times a day (BID) | ORAL | 1 refills | Status: DC

## 2020-04-04 MED ORDER — CETIRIZINE HCL 10 MG PO TABS: ORAL_TABLET | ORAL | 1 refills | Status: DC

## 2020-04-04 NOTE — Patient Instructions (Addendum)
  1.  Allergen avoidance measures?  2.  Every day utilize the following medications:   A. Cetirizine 10 mg - 1-2 tablets 2 times per day (MAX=40mg /day)  B. Famotidine 20 mg - 1 tablet 2 times per day  C. Montelukast 10 mg - 1 tablet 1 time per day  3.  Can add OTC Benadryl-25-50 mg every 6 hours if needed  4.  Blood - ANA w/R to ENA, RA, CCP, C3, C4, C1Q, CRP, thyroid peroxidase  5. Further evaluation?  6. Further treatment? Omalizumab?  7. Return to clinic in 3 weeks or earlier if problem  8. Plan for fall flu vaccine

## 2020-04-04 NOTE — Progress Notes (Signed)
[facility] [place] [facility] [place] [facility] [place] [facility] [place]   Dear Dr. Gerarda Fraction,  Thank you for referring Brittany Jackson to the [facility] on 04/04/2020.   Below is a summation of this patient's evaluation and recommendations.  Thank you for your referral. I will keep you informed about this patient's response to treatment.   If you have any questions please do not hesitate to contact me.   Sincerely,  Jiles Prows, MD Allergy / Immunology [facility]   ______________________________________________________________________    NEW PATIENT NOTE  Referring Provider: Redmond School, MD Primary Provider: Redmond School, MD Date of office visit: 04/04/2020    Subjective:   Chief Complaint:  Brittany Jackson (DOB: 05/11/1941) is a 79 y.o. female who presents to the clinic on 04/04/2020 with a chief complaint of Urticaria .     HPI: Brittany Jackson presents to this clinic in evaluation of "rash".  Apparently at the tail end of March 2021 she started to develop issues with red raised itchy lesions across her body initially involving her periorbital region on the left.  Over the course of the past 4 months she has continued to have recurrent episodes requiring the administration of 4 courses of systemic steroids.  Each time she gets a systemic steroid she responds quite well and everything completely abates only to return after prednisone is discontinued.  Currently she has a handful of lesions that occur at any point in time.  The skin lesions are very red and intensely itchy.  They would last 4 to 5 days and sometimes leave behind a "spot" underneath or a "hard place" underneath.  She has no associated systemic or constitutional symptoms.  However, it should be noted that she has had inflammation of her right external ear on 3 occasions during this timeframe.  Her ear would become intensely red and throbbing.   She has required topical agents administered by ENT to get this under better control but it actually appears as though the administration of prednisone makes this issue much better.  There has not really been a significant environmental change that may account for this issue of immune activation.  She has not had any new exposures at home.  She has not started any new supplements or health foods or herbs or medications, she does not have any symptoms to suggest an ongoing infectious disease or rheumatologic disease.  She does not have any symptoms to suggest a history of atopic disease.  She has received 2 Pfizer Covid vaccinations.  She has been smoking tobacco products at a rate of 1 to 1-1/2 pack/day since the age of 69.  Past Medical History:  Diagnosis Date  . CHF (congestive heart failure) ([facility])   . Hypertension   . Urticaria     Past Surgical History:  Procedure Laterality Date  . APPENDECTOMY    . BACK SURGERY    . HUMERUS IM NAIL Left 08/24/2013   Procedure: INTRAMEDULLARY (IM) NAIL HUMERAL;  Surgeon: Nita Sells, MD;  Location: [facility];  Service: Orthopedics;  Laterality: Left;  . TUBAL LIGATION      Allergies as of 04/04/2020      Reactions   Codeine    Unknown   Sulfa Antibiotics    Unknown      Medication List    albuterol 4 MG tablet Commonly known as: PROVENTIL Take by mouth.   aspirin EC 81  MG tablet Take 81 mg by mouth daily.   furosemide 20 MG tablet Commonly known as: LASIX Take 0.5 tablets (10 mg total) by mouth every other day.   methocarbamol 500 MG tablet Commonly known as: ROBAXIN Take 500 mg by mouth daily.   quinapril 40 MG tablet Commonly known as: ACCUPRIL Take 40 mg by mouth at bedtime.       Review of systems negative except as noted in HPI / PMHx or noted below:  Review of Systems  Constitutional: Negative.   HENT: Negative.   Eyes: Negative.   Respiratory: Negative.   Cardiovascular: Negative.   Gastrointestinal:  Negative.   Genitourinary: Negative.   Musculoskeletal: Negative.   Skin: Negative.   Neurological: Negative.   Endo/Heme/Allergies: Negative.   Psychiatric/Behavioral: Negative.     Family History  Problem Relation Age of Onset  . Hypertension Sister   . Allergic rhinitis Neg Hx   . Asthma Neg Hx   . Angioedema Neg Hx   . Atopy Neg Hx   . Eczema Neg Hx   . Immunodeficiency Neg Hx   . Urticaria Neg Hx     Social History   Socioeconomic History  . Marital status: Widowed    Spouse name: Not on file  . Number of children: Not on file  . Years of education: Not on file  . Highest education level: Not on file  Occupational History  . Not on file  Tobacco Use  . Smoking status: Current Every Day Smoker    Packs/day: 0.50    Types: Cigarettes  . Smokeless tobacco: Never Used  Substance and Sexual Activity  . Alcohol use: Yes    Comment: occasional  . Drug use: No  . Sexual activity: Never    Birth control/protection: Surgical  Other Topics Concern  . Not on file  Social History Narrative  . Not on file    Environmental and Social history  Lives in a house with a dry environment, dog located inside the household, carpet in the bedroom, plastic on the bed, plastic on the pillow, actively smoking tobacco products.  Objective:   Vitals:   04/04/20 0935  BP: 118/74  Pulse: 84  Resp: 16  Temp: 98.3 F (36.8 C)  SpO2: 93%   Height: 5' 6.5" (168.9 cm) Weight: 251 lb 12.8 oz (114.2 kg)  Physical Exam Constitutional:      Appearance: She is not diaphoretic.  HENT:     Head: Normocephalic.     Right Ear: Tympanic membrane, ear canal and external ear normal.     Left Ear: Tympanic membrane, ear canal and external ear normal.     Nose: Nose normal. No mucosal edema or rhinorrhea.     Mouth/Throat:     Pharynx: Uvula midline. No oropharyngeal exudate.  Eyes:     Conjunctiva/sclera: Conjunctivae normal.  Neck:     Thyroid: No thyromegaly.     Trachea:  Trachea normal. No tracheal tenderness or tracheal deviation.  Cardiovascular:     Rate and Rhythm: Normal rate and regular rhythm.     Heart sounds: Normal heart sounds, S1 normal and S2 normal. No murmur heard.   Pulmonary:     Effort: No respiratory distress.     Breath sounds: Normal breath sounds. No stridor. No wheezing or rales.  Lymphadenopathy:     Head:     Right side of head: No tonsillar adenopathy.     Left side of head: No tonsillar adenopathy.  Cervical: No cervical adenopathy.  Skin:    Findings: Rash (Approximately 6 circumferential slightly erythematous slightly firm lesions involving trunk and upper extremity.) present. No erythema.     Nails: There is no clubbing.  Neurological:     Mental Status: She is alert.     Diagnostics: Allergy skin tests were performed.  She did not demonstrate any hypersensitivity against a screening panel of aeroallergens or foods.  Results of blood tests obtained 20 March 2020 refers to WBC 8.5, absolute eosinophil 100, absolute lymphocyte 2900, hemoglobin 13.6, platelets 274,  Results of blood tests obtained 17 March 2020 identifies WBC 15.8, absolute eosinophil 500, absolute lymphocyte 3200, hemoglobin 14.1, platelet 313, creatinine 1.12 mg/DL, AST 20 2U/L, ALT 20 4U/L, sed rate 17, TSH 1.70 IU/mL  Assessment and Plan:    1. Allergic urticaria   2. Inflammatory dermatosis   3. Relapsing polychondritis   4. Other eosinophilia     1.  Allergen avoidance measures?  2.  Every day utilize the following medications:   A. Cetirizine 10 mg - 1-2 tablets 2 times per day (MAX=40mg /day)  B. Famotidine 20 mg - 1 tablet 2 times per day  C. Montelukast 10 mg - 1 tablet 1 time per day  3.  Can add OTC Benadryl-25-50 mg every 6 hours if needed  4.  Blood - ANA w/R to ENA, RA, CCP, C3, C4, C1Q, CRP, thyroid peroxidase  5. Further evaluation?  6. Further treatment? Omalizumab?  7. Return to clinic in 3 weeks or earlier if  problem  8. Plan for fall flu vaccine  Livana has some form of immune activation.  It is not entirely clear that this is atopic in nature.  Given the fact that she has had recurrent inflammation of her external ear on 3 occasions during the timeframe in which she has been having her cutaneous abnormality we need to consider the possibility that she has relapsing polychondritis and we will screen her blood with the tests noted above.  I have asked her to consistently use an H1 and H2 receptor blocker and a leukotriene modifier.  If we can get her immune activation under good control and cannot identify the etiologic agent responsible for this issue we will consider starting her on omalizumab.  Jiles Prows, MD Allergy / Immunology [facility]

## 2020-04-05 ENCOUNTER — Telehealth: Payer: Self-pay | Admitting: Allergy and Immunology

## 2020-04-05 ENCOUNTER — Encounter: Payer: Self-pay | Admitting: Allergy and Immunology

## 2020-04-05 NOTE — Telephone Encounter (Signed)
Unable to reach patient. Left message for patient to call us back.

## 2020-04-05 NOTE — Telephone Encounter (Signed)
Patient called and wanted to know if the LAB result have come . She said that she had 4 more spot to come up and they are itching. [phone]  [phone].

## 2020-04-10 NOTE — Telephone Encounter (Signed)
Called and left a voicemail for both numbers provided asking for patient to return phone call.

## 2020-04-11 LAB — THYROID PEROXIDASE ANTIBODY: Thyroperoxidase Ab SerPl-aCnc: <8 IU/mL (ref 0–34)

## 2020-04-11 LAB — RHEUMATOID ARTHRITIS PROFILE
Cyclic Citrullin Peptide Ab: 4 units (ref 0–19)
Rhuematoid fact SerPl-aCnc: <10 IU/mL (ref 0.0–13.9)

## 2020-04-11 LAB — C3 AND C4
Complement C3, Serum: 144 mg/dL (ref 82–167)
Complement C4, Serum: 24 mg/dL (ref 12–38)

## 2020-04-11 LAB — COMPLEMENT COMPONENT C1Q: Complement C1Q: 19.3 mg/dL (ref 10.3–20.5)

## 2020-04-11 LAB — ANA W/REFLEX: Anti Nuclear Antibody (ANA): NEGATIVE

## 2020-04-11 LAB — C-REACTIVE PROTEIN: CRP: 12 mg/L — ABNORMAL HIGH (ref 0–10)

## 2020-04-11 NOTE — Telephone Encounter (Signed)
Called and spoke to the patient and she was inquiring about her labs. She also stated that her medication that we prescribed is not working and she broke out in hives the next day and her skin feels like alligator skin. Please advise.

## 2020-04-11 NOTE — Telephone Encounter (Signed)
Patient was calling about her labs, she said she had not heard from [phone] or [phone].

## 2020-04-11 NOTE — Telephone Encounter (Signed)
Please advise 

## 2020-04-17 ENCOUNTER — Ambulatory Visit: Payer: Self-pay

## 2020-04-17 ENCOUNTER — Ambulatory Visit: Payer: Medicare Other

## 2020-04-18 ENCOUNTER — Other Ambulatory Visit: Payer: Self-pay | Admitting: Allergy and Immunology

## 2020-04-19 DIAGNOSIS — L508 Other urticaria: Secondary | ICD-10-CM | POA: Diagnosis not present

## 2020-04-27 ENCOUNTER — Ambulatory Visit: Payer: Self-pay | Admitting: Allergy

## 2020-05-23 ENCOUNTER — Ambulatory Visit: Payer: Medicare Other | Admitting: Allergy and Immunology

## 2020-06-07 DIAGNOSIS — Z1331 Encounter for screening for depression: Secondary | ICD-10-CM | POA: Diagnosis not present

## 2020-06-07 DIAGNOSIS — I10 Essential (primary) hypertension: Secondary | ICD-10-CM | POA: Diagnosis not present

## 2020-06-07 DIAGNOSIS — Z0001 Encounter for general adult medical examination with abnormal findings: Secondary | ICD-10-CM | POA: Diagnosis not present

## 2020-06-07 DIAGNOSIS — Z6841 Body Mass Index (BMI) 40.0 and over, adult: Secondary | ICD-10-CM | POA: Diagnosis not present

## 2020-06-07 DIAGNOSIS — E7849 Other hyperlipidemia: Secondary | ICD-10-CM | POA: Diagnosis not present

## 2020-06-15 NOTE — Patient Instructions (Addendum)
Allergic urticaria Increase cetirizine (Zyrtec) 10 mg 1 tablet twice a day Continue famotidine 20 mg 1 tablet twice a day Continue montelukast 10 mg once a day May use over-the-counter Benadryl 25 to 50 mg every 6 hours as needed for itching Consider Xolair injections Caution as these medications can cause you to be drowsy  Please let us know if this treatment plan is not working well for you Schedule follow-up appointment in 1 month

## 2020-06-16 ENCOUNTER — Ambulatory Visit (INDEPENDENT_AMBULATORY_CARE_PROVIDER_SITE_OTHER): Payer: Medicare Other | Admitting: Family

## 2020-06-16 ENCOUNTER — Encounter: Payer: Self-pay | Admitting: Family

## 2020-06-16 ENCOUNTER — Other Ambulatory Visit: Payer: Self-pay

## 2020-06-16 VITALS — BP 154/62 | HR 86 | Temp 98.0°F | Resp 18 | Ht 66.0 in | Wt 248.2 lb

## 2020-06-16 DIAGNOSIS — E782 Mixed hyperlipidemia: Secondary | ICD-10-CM | POA: Diagnosis not present

## 2020-06-16 DIAGNOSIS — L989 Disorder of the skin and subcutaneous tissue, unspecified: Secondary | ICD-10-CM

## 2020-06-16 DIAGNOSIS — L500 Allergic urticaria: Secondary | ICD-10-CM | POA: Diagnosis not present

## 2020-06-16 DIAGNOSIS — Z6841 Body Mass Index (BMI) 40.0 and over, adult: Secondary | ICD-10-CM | POA: Diagnosis not present

## 2020-06-16 DIAGNOSIS — Z0001 Encounter for general adult medical examination with abnormal findings: Secondary | ICD-10-CM | POA: Diagnosis not present

## 2020-06-16 DIAGNOSIS — D7219 Other eosinophilia: Secondary | ICD-10-CM

## 2020-06-16 DIAGNOSIS — M941 Relapsing polychondritis: Secondary | ICD-10-CM

## 2020-06-16 DIAGNOSIS — N184 Chronic kidney disease, stage 4 (severe): Secondary | ICD-10-CM | POA: Diagnosis not present

## 2020-06-16 DIAGNOSIS — Z1389 Encounter for screening for other disorder: Secondary | ICD-10-CM | POA: Diagnosis not present

## 2020-06-16 NOTE — Progress Notes (Signed)
[address] [place] [address] Dept: [phone]  FOLLOW UP NOTE  Patient ID: Brittany Jackson, female    DOB: 1940-09-02  Age: 79 y.o. MRN: [id number] Date of Office Visit: 06/16/2020  Assessment  Chief Complaint: Urticaria (Pt feels like medication is not working. breaking out on left side )  HPI Brittany Jackson is an 79 year old female who presents today for follow-up of allergic urticaria, inflammatory dermatosis, relapsing polychondritis, and eosinophilia.  She was last seen on April 04, 2020 by Dr. Neldon Mc.  Allergic urticaria is reported as not well controlled with Zyrtec 10 mg once a day, famotidine 20 mg 1 tablet twice a day, and Singulair 10 mg at night.  She did not realize that she could increase her dose of Zyrtec to twice a day.  She reports that her rash started again 4 days ago on the upper part of her left arm and whenever she rubs on her skin a red area with a hard center will pop up.  These areas will last 3 to 4 days.  She will then have 2 to 3 days without having any itching.  She reports that Zyrtec causes her to sneeze and have a runny nose.  She reports that she has done research on Xolair and does not want to do those injections because they could be fatal.  Suggested that we could try a different antihistamine and she is okay with increasing Zyrtec to twice a day at this time.  She has not had any inflammation of her ear since her last office visit.  Current medications are as listed in the chart.  Drug Allergies:  Allergies  Allergen Reactions  . Codeine     Unknown  . Sulfa Antibiotics     Unknown    Review of Systems: Review of Systems  Constitutional: Negative for chills and fever.  HENT:       Reports post nasal drip and sneezing and denies nasal congestion  Eyes:       Denies itchy watery eyes  Respiratory: Negative for cough, shortness of breath and wheezing.   Cardiovascular: Negative for chest pain and palpitations.  Gastrointestinal:  Negative for abdominal pain and heartburn.  Genitourinary: Negative for dysuria.  Skin: Positive for itching and rash.  Neurological: Negative for headaches.    Physical Exam: BP (!) 154/62 (BP Location: Left Arm, Patient Position: Sitting, Cuff Size: Normal)   Pulse 86   Temp 98 F (36.7 C) (Temporal)   Resp 18   Ht 5\' 6"  (1.676 m)   Wt 248 lb 3.2 oz (112.6 kg)   SpO2 94%   BMI 40.06 kg/m    Physical Exam Constitutional:      Appearance: Normal appearance.  HENT:     Head: Normocephalic and atraumatic.     Comments: Pharynx normal. Eyes normal. Ears normal. Nose normal    Right Ear: Tympanic membrane, ear canal and external ear normal.     Left Ear: Tympanic membrane, ear canal and external ear normal.     Nose: Nose normal.     Mouth/Throat:     Mouth: Mucous membranes are moist.     Pharynx: Oropharynx is clear.  Eyes:     Conjunctiva/sclera: Conjunctivae normal.  Cardiovascular:     Rate and Rhythm: Regular rhythm.     Heart sounds: Normal heart sounds.  Pulmonary:     Effort: Pulmonary effort is normal.     Breath sounds: Normal breath sounds.  Comments: Lungs clear to auscultation Musculoskeletal:     Cervical back: Neck supple.  Skin:    General: Skin is warm.     Comments:  Slightly erythematous lesions noted on left arm. She reports one area on her hip  Neurological:     Mental Status: She is alert and oriented to person, place, and time.  Psychiatric:        Mood and Affect: Mood normal.        Behavior: Behavior normal.        Thought Content: Thought content normal.        Judgment: Judgment normal.     Diagnostics:  None  Assessment and Plan: 1. Allergic urticaria   2. Inflammatory dermatosis   3. Relapsing polychondritis   4. Other eosinophilia     No orders of the defined types were placed in this encounter.   Patient Instructions  Allergic urticaria Increase cetirizine (Zyrtec) 10 mg 1 tablet twice a day Continue famotidine 20  mg 1 tablet twice a day Continue montelukast 10 mg once a day May use over-the-counter Benadryl 25 to 50 mg every 6 hours as needed for itching Consider Xolair injections Caution as these medications can cause you to be drowsy  Please let us know if this treatment plan is not working well for you Schedule follow-up appointment in 1 month    Return in about 4 weeks (around 07/14/2020), or if symptoms worsen or fail to improve.    Thank you for the opportunity to care for this patient.  Please do not hesitate to contact me with questions.  Althea Charon, FNP [facility]

## 2020-06-28 ENCOUNTER — Other Ambulatory Visit: Payer: Self-pay | Admitting: Allergy and Immunology

## 2020-07-18 ENCOUNTER — Encounter: Payer: Self-pay | Admitting: Allergy and Immunology

## 2020-07-18 ENCOUNTER — Ambulatory Visit (INDEPENDENT_AMBULATORY_CARE_PROVIDER_SITE_OTHER): Payer: Medicare Other | Admitting: Allergy and Immunology

## 2020-07-18 ENCOUNTER — Other Ambulatory Visit: Payer: Self-pay

## 2020-07-18 VITALS — BP 132/60 | HR 100 | Resp 18

## 2020-07-18 DIAGNOSIS — L500 Allergic urticaria: Secondary | ICD-10-CM | POA: Diagnosis not present

## 2020-07-18 DIAGNOSIS — H9193 Unspecified hearing loss, bilateral: Secondary | ICD-10-CM

## 2020-07-18 DIAGNOSIS — L989 Disorder of the skin and subcutaneous tissue, unspecified: Secondary | ICD-10-CM | POA: Diagnosis not present

## 2020-07-18 MED ORDER — MOMETASONE FUROATE 0.1 % EX OINT: TOPICAL_OINTMENT | CUTANEOUS | 2 refills | Status: DC

## 2020-07-18 MED ORDER — CETIRIZINE HCL 10 MG PO TABS: ORAL_TABLET | ORAL | 2 refills | Status: DC

## 2020-07-18 MED ORDER — FAMOTIDINE 40 MG PO TABS: 40.0000 mg | ORAL_TABLET | Freq: Two times a day (BID) | ORAL | 2 refills | Status: DC

## 2020-07-18 NOTE — Patient Instructions (Addendum)
  1.  Every day utilize the following medications:   A. Cetirizine 10 mg - 2 tablets 2 times per day   B. Famotidine 20 mg - 1 tablet 2 times per day  C. Montelukast 10 mg - 1 tablet 1 time per day  3.  Can add OTC Benadryl-25-50 mg every 6 hours if needed  4.  Can add mometasone 0.1% ointment to inflamed skin 1-2 times per day  5.  Literature about Xolair administration provided today  6.  Obtain COVID booster vaccine.  7.  Contact clinic in 8 weeks about response to treatment  8.  Consider having hearing evaluated and using a hearing aid

## 2020-07-18 NOTE — Progress Notes (Signed)
[facility]   Follow-up Note  Referring Provider: Redmond School, MD Primary Provider: Redmond School, MD Date of Office Visit: 07/18/2020  Subjective:   Brittany Jackson (DOB: 1940/12/08) is a 79 y.o. female who returns to the [facility] on 07/18/2020 in re-evaluation of the following:  HPI: Deavion presents to this clinic in reevaluation of her inflammatory dermatosis and urticaria and history of eosinophilia.  I have not seen her in this clinic since 04 April 2020 but she did visit with our nurse practitioner on 28 May 2018.  She continues to have recurrent urticaria and some of her lesions last 3 to 4 days and are very inflamed and hot.  Most of these have a predilection for the right side of her body especially her left arm.  They are intensely itchy.  This occurs even though she has been using cetirizine and famotidine and montelukast just 1 time per day.  She has received 2 Pfizer Covid vaccines.  Allergies as of 07/18/2020      Reactions   Codeine    Unknown   Sulfa Antibiotics    Unknown      Medication List      albuterol 4 MG tablet Commonly known as: PROVENTIL Take by mouth.   aspirin EC 81 MG tablet Take 81 mg by mouth daily.   cetirizine 10 MG tablet Commonly known as: ZYRTEC Take 1-2 tablets 2 times per day maximum.   diazepam 10 MG tablet Commonly known as: VALIUM Take 10 mg by mouth 3 (three) times daily as needed.   famotidine 20 MG tablet Commonly known as: PEPCID TAKE 1 TABLET BY MOUTH TWICE DAILY.   furosemide 20 MG tablet Commonly known as: LASIX Take 0.5 tablets (10 mg total) by mouth every other day.   methocarbamol 500 MG tablet Commonly known as: ROBAXIN Take 500 mg by mouth daily.   montelukast 10 MG tablet Commonly known as: SINGULAIR TAKE ONE TABLET AT BEDTIME.   quinapril 40 MG tablet Commonly known as: ACCUPRIL Take 40 mg by mouth at bedtime.       Past  Medical History:  Diagnosis Date  . CHF (congestive heart failure) ([facility])   . Hypertension   . Urticaria     Past Surgical History:  Procedure Laterality Date  . APPENDECTOMY    . BACK SURGERY    . HUMERUS IM NAIL Left 08/24/2013   Procedure: INTRAMEDULLARY (IM) NAIL HUMERAL;  Surgeon: Nita Sells, MD;  Location: [facility];  Service: Orthopedics;  Laterality: Left;  . TUBAL LIGATION      Review of systems negative except as noted in HPI / PMHx or noted below:  Review of Systems  Constitutional: Negative.   HENT: Negative.   Eyes: Negative.   Respiratory: Negative.   Cardiovascular: Negative.   Gastrointestinal: Negative.   Genitourinary: Negative.   Musculoskeletal: Negative.   Skin: Negative.   Neurological: Negative.   Endo/Heme/Allergies: Negative.   Psychiatric/Behavioral: Negative.      Objective:   Vitals:   07/18/20 1521  BP: 132/60  Pulse: 100  Resp: 18  SpO2: 96%          Physical Exam Constitutional:      Comments: Very hard of hearing  Skin:    Findings: Rash (Erythematous blotchy region upper arm left) present.     Diagnostics:    Results of blood tests obtained 2020-04-11 identifies CRP 12 mg/L, negative ANA, negative  CCP, C3 144 mg/DL, C4 24 mg/DL, C1q 19.3 mg/DL, thyroid peroxidase antibody less than 8U/mL,  Assessment and Plan:   1. Allergic urticaria   2. Inflammatory dermatosis   3. Hearing decreased, bilateral     1.  Every day utilize the following medications:   A. Cetirizine 10 mg - 2 tablets 2 times per day   B. Famotidine 20 mg - 1 tablet 2 times per day  C. Montelukast 10 mg - 1 tablet 1 time per day  3.  Can add OTC Benadryl-25-50 mg every 6 hours if needed  4.  Can add mometasone 0.1% ointment to inflamed skin 1-2 times per day  5.  Literature about Xolair administration provided today  6.  Obtain COVID booster vaccine.  7.  Contact clinic in 8 weeks about response to treatment  8.  Consider having  hearing evaluated and using a hearing aid  I am going to have Jameelah increase her dose of cetirizine and famotidine while continuing on montelukast and she can add in a topical anti-inflammatory agent as noted above and I have given her literature about Xolair during today's visit.  We will see how things go over the course of the next 8 weeks.  She can contact me by telephone noting her response to this approach.  Allena Katz, MD Allergy / Immunology [facility]

## 2020-07-19 ENCOUNTER — Encounter: Payer: Self-pay | Admitting: Allergy and Immunology

## 2020-07-20 ENCOUNTER — Other Ambulatory Visit: Payer: Self-pay | Admitting: *Deleted

## 2020-07-20 MED ORDER — MONTELUKAST SODIUM 10 MG PO TABS: 10.0000 mg | ORAL_TABLET | Freq: Every day | ORAL | 1 refills | Status: DC

## 2020-07-20 MED ORDER — FAMOTIDINE 20 MG PO TABS: 20.0000 mg | ORAL_TABLET | Freq: Two times a day (BID) | ORAL | 1 refills | Status: DC

## 2020-08-04 ENCOUNTER — Ambulatory Visit: Payer: Medicare Other | Attending: Internal Medicine

## 2020-08-04 DIAGNOSIS — Z23 Encounter for immunization: Secondary | ICD-10-CM

## 2020-08-04 NOTE — Progress Notes (Signed)
   Covid-19 Vaccination Clinic  Name:  Brittany Jackson    MRN: [id number] DOB: February 07, 1941  08/04/2020  [name] was observed post Covid-19 immunization for 30 minutes based on pre-vaccination screening without incident. She was provided with Vaccine Information Sheet and instruction to access the V-Safe system.   [name] was instructed to call 911 with any severe reactions post vaccine: Marland Kitchen Difficulty breathing  . Swelling of face and throat  . A fast heartbeat  . A bad rash all over body  . Dizziness and weakness   Immunizations Administered    Name Date Dose VIS Date Route   Pfizer COVID-19 Vaccine 08/04/2020  3:07 PM 0.3 mL 06/07/2020 Intramuscular   Manufacturer: [place]   Lot: [id number]   [place]: [id number]

## 2020-08-09 ENCOUNTER — Ambulatory Visit: Payer: Medicare Other

## 2020-10-10 ENCOUNTER — Telehealth: Payer: Self-pay | Admitting: Allergy and Immunology

## 2020-10-10 NOTE — Telephone Encounter (Signed)
Brittany Jackson called in and states that she would like to get a hearing aid like Dr. Neldon Mc suggested.  Brittany Jackson states in order for her to get one, her insurance needs a letter written by Dr. Neldon Mc, specifically stating he recommends that Brittany Jackson obtain a hearing test so she can get a hearing aid and it be free of charge.  Brittany Jackson would like a copy of the letter sent to her email at: [email or web address]  But also needs the letter faxed to her insurance company at [phone].  Brittany Jackson wanted to thank Dr. Neldon Mc for his professionalism and also wanted to let Dr. Neldon Mc know the treatment he recommended to her is doing great and she just wants to make sure he knows how thankful she is.  Please advise.

## 2020-10-16 NOTE — Telephone Encounter (Signed)
Please provide requested letter

## 2020-10-16 NOTE — Telephone Encounter (Signed)
Letter typed up and signed by Dr. Neldon Mc.  Will give to front desk personnel so she can email it to patient.

## 2020-10-16 NOTE — Telephone Encounter (Signed)
Left message for patient to call the office.  Please ask her the name of her insurance company so I know to whom I am sending the faxed letter.

## 2020-10-18 NOTE — Telephone Encounter (Signed)
Jennilee confirmed that the letter should be sent to [facility] at the fax number provided. Jadalee confirmed she did receive the letter we emailed to her as well.

## 2020-11-13 DIAGNOSIS — H903 Sensorineural hearing loss, bilateral: Secondary | ICD-10-CM | POA: Diagnosis not present

## 2020-12-11 DIAGNOSIS — F419 Anxiety disorder, unspecified: Secondary | ICD-10-CM | POA: Diagnosis not present

## 2020-12-11 DIAGNOSIS — Z6839 Body mass index (BMI) 39.0-39.9, adult: Secondary | ICD-10-CM | POA: Diagnosis not present

## 2020-12-28 DIAGNOSIS — M21622 Bunionette of left foot: Secondary | ICD-10-CM | POA: Diagnosis not present

## 2020-12-28 DIAGNOSIS — M21621 Bunionette of right foot: Secondary | ICD-10-CM | POA: Diagnosis not present

## 2020-12-28 DIAGNOSIS — D2372 Other benign neoplasm of skin of left lower limb, including hip: Secondary | ICD-10-CM | POA: Diagnosis not present

## 2020-12-28 DIAGNOSIS — M792 Neuralgia and neuritis, unspecified: Secondary | ICD-10-CM | POA: Diagnosis not present

## 2021-02-08 DIAGNOSIS — Z20822 Contact with and (suspected) exposure to covid-19: Secondary | ICD-10-CM | POA: Diagnosis not present

## 2021-02-19 DIAGNOSIS — Z20822 Contact with and (suspected) exposure to covid-19: Secondary | ICD-10-CM | POA: Diagnosis not present

## 2021-03-02 DIAGNOSIS — Z20822 Contact with and (suspected) exposure to covid-19: Secondary | ICD-10-CM | POA: Diagnosis not present

## 2021-03-30 DIAGNOSIS — Z20822 Contact with and (suspected) exposure to covid-19: Secondary | ICD-10-CM | POA: Diagnosis not present

## 2021-04-18 DIAGNOSIS — Z20822 Contact with and (suspected) exposure to covid-19: Secondary | ICD-10-CM | POA: Diagnosis not present

## 2021-04-22 DIAGNOSIS — Z20822 Contact with and (suspected) exposure to covid-19: Secondary | ICD-10-CM | POA: Diagnosis not present

## 2021-04-25 DIAGNOSIS — Z20822 Contact with and (suspected) exposure to covid-19: Secondary | ICD-10-CM | POA: Diagnosis not present

## 2021-06-27 DIAGNOSIS — M1A00X0 Idiopathic chronic gout, unspecified site, without tophus (tophi): Secondary | ICD-10-CM | POA: Diagnosis not present

## 2021-06-27 DIAGNOSIS — Z0000 Encounter for general adult medical examination without abnormal findings: Secondary | ICD-10-CM | POA: Diagnosis not present

## 2021-06-27 DIAGNOSIS — Z6838 Body mass index (BMI) 38.0-38.9, adult: Secondary | ICD-10-CM | POA: Diagnosis not present

## 2021-06-27 DIAGNOSIS — I5031 Acute diastolic (congestive) heart failure: Secondary | ICD-10-CM | POA: Diagnosis not present

## 2021-06-27 DIAGNOSIS — E782 Mixed hyperlipidemia: Secondary | ICD-10-CM | POA: Diagnosis not present

## 2021-06-27 DIAGNOSIS — I10 Essential (primary) hypertension: Secondary | ICD-10-CM | POA: Diagnosis not present

## 2021-06-27 DIAGNOSIS — F419 Anxiety disorder, unspecified: Secondary | ICD-10-CM | POA: Diagnosis not present

## 2021-06-27 DIAGNOSIS — Z1331 Encounter for screening for depression: Secondary | ICD-10-CM | POA: Diagnosis not present

## 2021-07-09 DIAGNOSIS — M21622 Bunionette of left foot: Secondary | ICD-10-CM | POA: Diagnosis not present

## 2021-07-09 DIAGNOSIS — D2372 Other benign neoplasm of skin of left lower limb, including hip: Secondary | ICD-10-CM | POA: Diagnosis not present

## 2021-07-09 DIAGNOSIS — L84 Corns and callosities: Secondary | ICD-10-CM | POA: Diagnosis not present

## 2021-07-09 DIAGNOSIS — M21621 Bunionette of right foot: Secondary | ICD-10-CM | POA: Diagnosis not present

## 2021-07-26 DIAGNOSIS — Z20822 Contact with and (suspected) exposure to covid-19: Secondary | ICD-10-CM | POA: Diagnosis not present

## 2021-07-30 DIAGNOSIS — Z20822 Contact with and (suspected) exposure to covid-19: Secondary | ICD-10-CM | POA: Diagnosis not present

## 2021-10-02 DIAGNOSIS — Z23 Encounter for immunization: Secondary | ICD-10-CM | POA: Diagnosis not present

## 2021-10-22 DIAGNOSIS — Z20828 Contact with and (suspected) exposure to other viral communicable diseases: Secondary | ICD-10-CM | POA: Diagnosis not present

## 2021-10-22 DIAGNOSIS — Z1152 Encounter for screening for COVID-19: Secondary | ICD-10-CM | POA: Diagnosis not present

## 2021-11-02 DIAGNOSIS — Z20822 Contact with and (suspected) exposure to covid-19: Secondary | ICD-10-CM | POA: Diagnosis not present

## 2021-11-05 DIAGNOSIS — Z20822 Contact with and (suspected) exposure to covid-19: Secondary | ICD-10-CM | POA: Diagnosis not present

## 2021-11-08 DIAGNOSIS — Z20822 Contact with and (suspected) exposure to covid-19: Secondary | ICD-10-CM | POA: Diagnosis not present

## 2021-11-09 DIAGNOSIS — Z23 Encounter for immunization: Secondary | ICD-10-CM | POA: Diagnosis not present

## 2021-11-22 DIAGNOSIS — Z20822 Contact with and (suspected) exposure to covid-19: Secondary | ICD-10-CM | POA: Diagnosis not present

## 2021-12-01 DIAGNOSIS — Z20822 Contact with and (suspected) exposure to covid-19: Secondary | ICD-10-CM | POA: Diagnosis not present

## 2021-12-02 DIAGNOSIS — Z20822 Contact with and (suspected) exposure to covid-19: Secondary | ICD-10-CM | POA: Diagnosis not present

## 2021-12-04 DIAGNOSIS — Z20822 Contact with and (suspected) exposure to covid-19: Secondary | ICD-10-CM | POA: Diagnosis not present

## 2021-12-06 DIAGNOSIS — Z23 Encounter for immunization: Secondary | ICD-10-CM | POA: Diagnosis not present

## 2021-12-17 DIAGNOSIS — Z20822 Contact with and (suspected) exposure to covid-19: Secondary | ICD-10-CM | POA: Diagnosis not present

## 2021-12-19 DIAGNOSIS — F419 Anxiety disorder, unspecified: Secondary | ICD-10-CM | POA: Diagnosis not present

## 2021-12-22 DIAGNOSIS — Z20822 Contact with and (suspected) exposure to covid-19: Secondary | ICD-10-CM | POA: Diagnosis not present

## 2021-12-24 DIAGNOSIS — Z20822 Contact with and (suspected) exposure to covid-19: Secondary | ICD-10-CM | POA: Diagnosis not present

## 2021-12-27 DIAGNOSIS — Z23 Encounter for immunization: Secondary | ICD-10-CM | POA: Diagnosis not present

## 2022-01-08 ENCOUNTER — Inpatient Hospital Stay (HOSPITAL_COMMUNITY)
Admission: EM | Admit: 2022-01-08 | Discharge: 2022-01-22 | DRG: 280 | Disposition: A | Discharge status: Home Health | Payer: Medicare Other | Attending: Cardiology | Admitting: Cardiology

## 2022-01-08 ENCOUNTER — Other Ambulatory Visit: Payer: Self-pay

## 2022-01-08 ENCOUNTER — Inpatient Hospital Stay (HOSPITAL_COMMUNITY)
Admission: EM | Disposition: A | Discharge status: Home Health | Payer: Self-pay | Source: Home / Self Care | Attending: Cardiology

## 2022-01-08 ENCOUNTER — Encounter (HOSPITAL_COMMUNITY): Payer: Self-pay

## 2022-01-08 DIAGNOSIS — I5041 Acute combined systolic (congestive) and diastolic (congestive) heart failure: Secondary | ICD-10-CM

## 2022-01-08 DIAGNOSIS — I2109 ST elevation (STEMI) myocardial infarction involving other coronary artery of anterior wall: Principal | ICD-10-CM | POA: Diagnosis present

## 2022-01-08 DIAGNOSIS — I340 Nonrheumatic mitral (valve) insufficiency: Secondary | ICD-10-CM | POA: Diagnosis not present

## 2022-01-08 DIAGNOSIS — I83813 Varicose veins of bilateral lower extremities with pain: Secondary | ICD-10-CM | POA: Diagnosis not present

## 2022-01-08 DIAGNOSIS — Z885 Allergy status to narcotic agent status: Secondary | ICD-10-CM | POA: Diagnosis not present

## 2022-01-08 DIAGNOSIS — I10 Essential (primary) hypertension: Secondary | ICD-10-CM | POA: Diagnosis not present

## 2022-01-08 DIAGNOSIS — I255 Ischemic cardiomyopathy: Secondary | ICD-10-CM | POA: Diagnosis present

## 2022-01-08 DIAGNOSIS — F17210 Nicotine dependence, cigarettes, uncomplicated: Secondary | ICD-10-CM | POA: Diagnosis present

## 2022-01-08 DIAGNOSIS — Z6841 Body Mass Index (BMI) 40.0 and over, adult: Secondary | ICD-10-CM | POA: Diagnosis not present

## 2022-01-08 DIAGNOSIS — Z515 Encounter for palliative care: Secondary | ICD-10-CM | POA: Diagnosis not present

## 2022-01-08 DIAGNOSIS — I130 Hypertensive heart and chronic kidney disease with heart failure and stage 1 through stage 4 chronic kidney disease, or unspecified chronic kidney disease: Secondary | ICD-10-CM | POA: Diagnosis present

## 2022-01-08 DIAGNOSIS — E871 Hypo-osmolality and hyponatremia: Secondary | ICD-10-CM | POA: Diagnosis not present

## 2022-01-08 DIAGNOSIS — Z8249 Family history of ischemic heart disease and other diseases of the circulatory system: Secondary | ICD-10-CM | POA: Diagnosis not present

## 2022-01-08 DIAGNOSIS — R0902 Hypoxemia: Secondary | ICD-10-CM | POA: Diagnosis not present

## 2022-01-08 DIAGNOSIS — Z951 Presence of aortocoronary bypass graft: Secondary | ICD-10-CM

## 2022-01-08 DIAGNOSIS — J449 Chronic obstructive pulmonary disease, unspecified: Secondary | ICD-10-CM | POA: Diagnosis present

## 2022-01-08 DIAGNOSIS — I161 Hypertensive emergency: Secondary | ICD-10-CM | POA: Diagnosis present

## 2022-01-08 DIAGNOSIS — E8770 Fluid overload, unspecified: Secondary | ICD-10-CM | POA: Diagnosis not present

## 2022-01-08 DIAGNOSIS — J9811 Atelectasis: Secondary | ICD-10-CM | POA: Diagnosis not present

## 2022-01-08 DIAGNOSIS — Z452 Encounter for adjustment and management of vascular access device: Secondary | ICD-10-CM | POA: Diagnosis not present

## 2022-01-08 DIAGNOSIS — R0602 Shortness of breath: Secondary | ICD-10-CM | POA: Diagnosis not present

## 2022-01-08 DIAGNOSIS — Z7982 Long term (current) use of aspirin: Secondary | ICD-10-CM

## 2022-01-08 DIAGNOSIS — E669 Obesity, unspecified: Secondary | ICD-10-CM | POA: Diagnosis not present

## 2022-01-08 DIAGNOSIS — Z79899 Other long term (current) drug therapy: Secondary | ICD-10-CM | POA: Diagnosis not present

## 2022-01-08 DIAGNOSIS — J189 Pneumonia, unspecified organism: Secondary | ICD-10-CM | POA: Diagnosis not present

## 2022-01-08 DIAGNOSIS — J90 Pleural effusion, not elsewhere classified: Secondary | ICD-10-CM | POA: Diagnosis not present

## 2022-01-08 DIAGNOSIS — R001 Bradycardia, unspecified: Secondary | ICD-10-CM | POA: Diagnosis not present

## 2022-01-08 DIAGNOSIS — R000 Tachycardia, unspecified: Secondary | ICD-10-CM | POA: Diagnosis not present

## 2022-01-08 DIAGNOSIS — I517 Cardiomegaly: Secondary | ICD-10-CM | POA: Diagnosis not present

## 2022-01-08 DIAGNOSIS — E875 Hyperkalemia: Secondary | ICD-10-CM | POA: Diagnosis present

## 2022-01-08 DIAGNOSIS — I25119 Atherosclerotic heart disease of native coronary artery with unspecified angina pectoris: Secondary | ICD-10-CM | POA: Diagnosis present

## 2022-01-08 DIAGNOSIS — N1831 Chronic kidney disease, stage 3a: Secondary | ICD-10-CM | POA: Diagnosis not present

## 2022-01-08 DIAGNOSIS — Z7189 Other specified counseling: Secondary | ICD-10-CM | POA: Diagnosis not present

## 2022-01-08 DIAGNOSIS — R34 Anuria and oliguria: Secondary | ICD-10-CM | POA: Diagnosis not present

## 2022-01-08 DIAGNOSIS — I5021 Acute systolic (congestive) heart failure: Secondary | ICD-10-CM | POA: Diagnosis not present

## 2022-01-08 DIAGNOSIS — I4891 Unspecified atrial fibrillation: Secondary | ICD-10-CM | POA: Diagnosis not present

## 2022-01-08 DIAGNOSIS — J8489 Other specified interstitial pulmonary diseases: Secondary | ICD-10-CM | POA: Diagnosis not present

## 2022-01-08 DIAGNOSIS — Z466 Encounter for fitting and adjustment of urinary device: Secondary | ICD-10-CM | POA: Diagnosis not present

## 2022-01-08 DIAGNOSIS — N369 Urethral disorder, unspecified: Secondary | ICD-10-CM | POA: Diagnosis not present

## 2022-01-08 DIAGNOSIS — J80 Acute respiratory distress syndrome: Secondary | ICD-10-CM | POA: Diagnosis not present

## 2022-01-08 DIAGNOSIS — Z20822 Contact with and (suspected) exposure to covid-19: Secondary | ICD-10-CM | POA: Diagnosis present

## 2022-01-08 DIAGNOSIS — F17200 Nicotine dependence, unspecified, uncomplicated: Secondary | ICD-10-CM | POA: Diagnosis present

## 2022-01-08 DIAGNOSIS — I2101 ST elevation (STEMI) myocardial infarction involving left main coronary artery: Secondary | ICD-10-CM

## 2022-01-08 DIAGNOSIS — E782 Mixed hyperlipidemia: Secondary | ICD-10-CM | POA: Diagnosis not present

## 2022-01-08 DIAGNOSIS — Z882 Allergy status to sulfonamides status: Secondary | ICD-10-CM

## 2022-01-08 DIAGNOSIS — R0689 Other abnormalities of breathing: Secondary | ICD-10-CM | POA: Diagnosis not present

## 2022-01-08 DIAGNOSIS — N179 Acute kidney failure, unspecified: Secondary | ICD-10-CM | POA: Diagnosis not present

## 2022-01-08 DIAGNOSIS — I5043 Acute on chronic combined systolic (congestive) and diastolic (congestive) heart failure: Secondary | ICD-10-CM | POA: Diagnosis not present

## 2022-01-08 DIAGNOSIS — I700 Atherosclerosis of aorta: Secondary | ICD-10-CM | POA: Diagnosis not present

## 2022-01-08 DIAGNOSIS — R54 Age-related physical debility: Secondary | ICD-10-CM | POA: Diagnosis present

## 2022-01-08 DIAGNOSIS — I213 ST elevation (STEMI) myocardial infarction of unspecified site: Secondary | ICD-10-CM | POA: Diagnosis present

## 2022-01-08 DIAGNOSIS — L270 Generalized skin eruption due to drugs and medicaments taken internally: Secondary | ICD-10-CM | POA: Diagnosis present

## 2022-01-08 DIAGNOSIS — R609 Edema, unspecified: Secondary | ICD-10-CM | POA: Diagnosis not present

## 2022-01-08 DIAGNOSIS — D631 Anemia in chronic kidney disease: Secondary | ICD-10-CM | POA: Diagnosis present

## 2022-01-08 DIAGNOSIS — R231 Pallor: Secondary | ICD-10-CM | POA: Diagnosis not present

## 2022-01-08 DIAGNOSIS — I272 Pulmonary hypertension, unspecified: Secondary | ICD-10-CM | POA: Diagnosis not present

## 2022-01-08 DIAGNOSIS — Z720 Tobacco use: Secondary | ICD-10-CM | POA: Diagnosis not present

## 2022-01-08 DIAGNOSIS — D649 Anemia, unspecified: Secondary | ICD-10-CM | POA: Diagnosis not present

## 2022-01-08 DIAGNOSIS — J9601 Acute respiratory failure with hypoxia: Secondary | ICD-10-CM | POA: Diagnosis not present

## 2022-01-08 DIAGNOSIS — Z01810 Encounter for preprocedural cardiovascular examination: Secondary | ICD-10-CM | POA: Diagnosis not present

## 2022-01-08 DIAGNOSIS — I2119 ST elevation (STEMI) myocardial infarction involving other coronary artery of inferior wall: Secondary | ICD-10-CM | POA: Diagnosis not present

## 2022-01-08 DIAGNOSIS — I5032 Chronic diastolic (congestive) heart failure: Secondary | ICD-10-CM | POA: Diagnosis not present

## 2022-01-08 DIAGNOSIS — I2510 Atherosclerotic heart disease of native coronary artery without angina pectoris: Secondary | ICD-10-CM | POA: Diagnosis not present

## 2022-01-08 DIAGNOSIS — I2102 ST elevation (STEMI) myocardial infarction involving left anterior descending coronary artery: Secondary | ICD-10-CM | POA: Diagnosis not present

## 2022-01-08 DIAGNOSIS — I480 Paroxysmal atrial fibrillation: Secondary | ICD-10-CM | POA: Diagnosis present

## 2022-01-08 DIAGNOSIS — E785 Hyperlipidemia, unspecified: Secondary | ICD-10-CM | POA: Diagnosis present

## 2022-01-08 DIAGNOSIS — N281 Cyst of kidney, acquired: Secondary | ICD-10-CM | POA: Diagnosis not present

## 2022-01-08 DIAGNOSIS — J811 Chronic pulmonary edema: Secondary | ICD-10-CM | POA: Diagnosis not present

## 2022-01-08 DIAGNOSIS — Z7901 Long term (current) use of anticoagulants: Secondary | ICD-10-CM

## 2022-01-08 HISTORY — PX: RIGHT HEART CATH: CATH118263

## 2022-01-08 HISTORY — PX: CORONARY/GRAFT ACUTE MI REVASCULARIZATION: CATH118305

## 2022-01-08 SURGERY — CORONARY/GRAFT ACUTE MI REVASCULARIZATION
Anesthesia: LOCAL

## 2022-01-08 MED ORDER — ALBUTEROL SULFATE (2.5 MG/3ML) 0.083% IN NEBU: 10.0000 mg/h | INHALATION_SOLUTION | RESPIRATORY_TRACT | Status: DC

## 2022-01-08 MED ORDER — METHYLPREDNISOLONE SODIUM SUCC 125 MG IJ SOLR
125.0000 mg | Freq: Once | INTRAMUSCULAR | Status: AC
  Administered 2022-01-09: 125 mg via INTRAVENOUS
  Filled 2022-01-08: qty 2

## 2022-01-08 MED ORDER — NITROGLYCERIN IN D5W 200-5 MCG/ML-% IV SOLN
5.0000 ug/min | INTRAVENOUS | Status: DC
  Administered 2022-01-09 (×2): 5 ug/min via INTRAVENOUS
  Administered 2022-01-09: 40 ug/min via INTRAVENOUS
  Administered 2022-01-10: 5 ug/min via INTRAVENOUS
  Filled 2022-01-08 (×4): qty 250

## 2022-01-08 SURGICAL SUPPLY — 13 items
BAND ZEPHYR COMPRESS 30 LONG (HEMOSTASIS) ×1 IMPLANT
CATH BALLN WEDGE 5F 110CM (CATHETERS) ×1 IMPLANT
CATH OPTITORQUE TIG 4.0 5F (CATHETERS) ×1 IMPLANT
ELECT DEFIB PAD ADLT CADENCE (PAD) ×1 IMPLANT
GLIDESHEATH SLEND A-KIT 6F 22G (SHEATH) ×1 IMPLANT
GUIDEWIRE INQWIRE 1.5J.035X260 (WIRE) IMPLANT
INQWIRE 1.5J .035X260CM (WIRE) ×2
KIT ENCORE 26 ADVANTAGE (KITS) ×1 IMPLANT
KIT HEART LEFT (KITS) ×2 IMPLANT
PACK CARDIAC CATHETERIZATION (CUSTOM PROCEDURE TRAY) ×2 IMPLANT
SHEATH GLIDE SLENDER 4/5FR (SHEATH) ×1 IMPLANT
TRANSDUCER W/STOPCOCK (MISCELLANEOUS) ×2 IMPLANT
TUBING CIL FLEX 10 FLL-RA (TUBING) ×2 IMPLANT

## 2022-01-08 NOTE — ED Provider Notes (Signed)
[facility] DEPT Provider Note   CSN: [id number] Arrival date & time: 01/08/22  2333     History {Add pertinent medical, surgical, social history, OB history to HPI:1} Chief Complaint  Patient presents with   Shortness of Breath    Brittany Jackson is a 81 y.o. female.  The history is provided by the patient and the EMS personnel.  Shortness of Breath     Home Medications Prior to Admission medications   Medication Sig Start Date End Date Taking? Authorizing Provider  albuterol (PROVENTIL) 4 MG tablet Take by mouth.  02/15/20   [provider]  aspirin EC 81 MG tablet Take 81 mg by mouth daily.    [provider]  cetirizine (ZYRTEC) 10 MG tablet Take 1-2 tablets 2 times per day maximum. 04/04/20   Kozlow, Donnamarie Poag, MD  cetirizine (ZYRTEC) 10 MG tablet Take 2 tablets 2 times daily. 07/18/20   Kozlow, Donnamarie Poag, MD  diazepam (VALIUM) 10 MG tablet Take 10 mg by mouth 3 (three) times daily as needed. 06/09/20   [provider]  famotidine (PEPCID) 20 MG tablet Take 1 tablet (20 mg total) by mouth 2 (two) times daily. 07/20/20   Kozlow, Donnamarie Poag, MD  famotidine (PEPCID) 40 MG tablet Take 1 tablet (40 mg total) by mouth 2 (two) times daily. 07/18/20   Kozlow, Donnamarie Poag, MD  furosemide (LASIX) 20 MG tablet Take 0.5 tablets (10 mg total) by mouth every other day. 04/10/12 08/23/13  Velvet Bathe, MD  methocarbamol (ROBAXIN) 500 MG tablet Take 500 mg by mouth daily.    [provider]  mometasone (ELOCON) 0.1 % ointment 1 application to inflamed skin 1-2 times daily. 07/18/20   Kozlow, Donnamarie Poag, MD  montelukast (SINGULAIR) 10 MG tablet Take 1 tablet (10 mg total) by mouth at bedtime. 07/20/20   Kozlow, Donnamarie Poag, MD  quinapril (ACCUPRIL) 40 MG tablet Take 40 mg by mouth at bedtime.    [provider]      Allergies    Codeine and Sulfa antibiotics    Review of Systems   Review of Systems  Respiratory:  Positive for shortness of  breath.    Physical Exam Updated Vital Signs Ht '5\' 6"'$  (1.676 m)   Wt 112.6 kg   BMI 40.07 kg/m  Physical Exam  ED Results / Procedures / Treatments   Labs (all labs ordered are listed, but only abnormal results are displayed) Labs Reviewed - No data to display  EKG None  Radiology No results found.  Procedures Procedures  {Document cardiac monitor, telemetry assessment procedure when appropriate:1}  Medications Ordered in ED Medications  nitroGLYCERIN 50 mg in dextrose 5 % 250 mL (0.2 mg/mL) infusion (has no administration in time range)  albuterol (PROVENTIL,VENTOLIN) solution continuous neb (has no administration in time range)  methylPREDNISolone sodium succinate (SOLU-MEDROL) 125 mg/2 mL injection 125 mg (has no administration in time range)    ED Course/ Medical Decision Making/ A&P                           Medical Decision Making Amount and/or Complexity of Data Reviewed Labs: ordered. Radiology: ordered.  Risk Prescription drug management.   ***  {Document critical care time when appropriate:1} {Document review of labs and clinical decision tools ie heart score, Chads2Vasc2 etc:1}  {Document your independent review of radiology images, and any outside records:1} {Document your discussion with family members, caretakers, and  with consultants:1} {Document social determinants of health affecting pt's care:1} {Document your decision making why or why not admission, treatments were needed:1} Final Clinical Impression(s) / ED Diagnoses Final diagnoses:  None    Rx / DC Orders ED Discharge Orders     None

## 2022-01-08 NOTE — ED Triage Notes (Signed)
Pt BIB EMS from home with c/o SOB that began x 3 hours ago. Pt was laying mulch in her yard when she became increasingly SOB. Pt endorses chest pain, describes as burning sensation. Upon EMS arrival, SPO2 86%, pt  placed on NRB 15 L. Duoneb given en route.

## 2022-01-09 ENCOUNTER — Inpatient Hospital Stay: Payer: Self-pay

## 2022-01-09 ENCOUNTER — Inpatient Hospital Stay (HOSPITAL_COMMUNITY): Payer: Medicare Other

## 2022-01-09 ENCOUNTER — Encounter (HOSPITAL_COMMUNITY): Payer: Self-pay | Admitting: Emergency Medicine

## 2022-01-09 ENCOUNTER — Emergency Department (HOSPITAL_COMMUNITY): Payer: Medicare Other

## 2022-01-09 DIAGNOSIS — F17210 Nicotine dependence, cigarettes, uncomplicated: Secondary | ICD-10-CM | POA: Diagnosis present

## 2022-01-09 DIAGNOSIS — N369 Urethral disorder, unspecified: Secondary | ICD-10-CM | POA: Diagnosis not present

## 2022-01-09 DIAGNOSIS — I5043 Acute on chronic combined systolic (congestive) and diastolic (congestive) heart failure: Secondary | ICD-10-CM

## 2022-01-09 DIAGNOSIS — N1831 Chronic kidney disease, stage 3a: Secondary | ICD-10-CM | POA: Diagnosis present

## 2022-01-09 DIAGNOSIS — J90 Pleural effusion, not elsewhere classified: Secondary | ICD-10-CM | POA: Diagnosis not present

## 2022-01-09 DIAGNOSIS — E782 Mixed hyperlipidemia: Secondary | ICD-10-CM | POA: Diagnosis not present

## 2022-01-09 DIAGNOSIS — I700 Atherosclerosis of aorta: Secondary | ICD-10-CM | POA: Diagnosis not present

## 2022-01-09 DIAGNOSIS — Z79899 Other long term (current) drug therapy: Secondary | ICD-10-CM | POA: Diagnosis not present

## 2022-01-09 DIAGNOSIS — J189 Pneumonia, unspecified organism: Secondary | ICD-10-CM | POA: Diagnosis not present

## 2022-01-09 DIAGNOSIS — F17200 Nicotine dependence, unspecified, uncomplicated: Secondary | ICD-10-CM | POA: Diagnosis present

## 2022-01-09 DIAGNOSIS — N281 Cyst of kidney, acquired: Secondary | ICD-10-CM | POA: Diagnosis not present

## 2022-01-09 DIAGNOSIS — I2510 Atherosclerotic heart disease of native coronary artery without angina pectoris: Secondary | ICD-10-CM | POA: Diagnosis not present

## 2022-01-09 DIAGNOSIS — I2109 ST elevation (STEMI) myocardial infarction involving other coronary artery of anterior wall: Secondary | ICD-10-CM | POA: Diagnosis present

## 2022-01-09 DIAGNOSIS — Z6841 Body Mass Index (BMI) 40.0 and over, adult: Secondary | ICD-10-CM | POA: Diagnosis not present

## 2022-01-09 DIAGNOSIS — Z882 Allergy status to sulfonamides status: Secondary | ICD-10-CM | POA: Diagnosis not present

## 2022-01-09 DIAGNOSIS — Z7982 Long term (current) use of aspirin: Secondary | ICD-10-CM | POA: Diagnosis not present

## 2022-01-09 DIAGNOSIS — I5032 Chronic diastolic (congestive) heart failure: Secondary | ICD-10-CM | POA: Diagnosis not present

## 2022-01-09 DIAGNOSIS — I130 Hypertensive heart and chronic kidney disease with heart failure and stage 1 through stage 4 chronic kidney disease, or unspecified chronic kidney disease: Secondary | ICD-10-CM | POA: Diagnosis present

## 2022-01-09 DIAGNOSIS — Z8249 Family history of ischemic heart disease and other diseases of the circulatory system: Secondary | ICD-10-CM | POA: Diagnosis not present

## 2022-01-09 DIAGNOSIS — Z720 Tobacco use: Secondary | ICD-10-CM | POA: Diagnosis not present

## 2022-01-09 DIAGNOSIS — R001 Bradycardia, unspecified: Secondary | ICD-10-CM | POA: Diagnosis not present

## 2022-01-09 DIAGNOSIS — I25119 Atherosclerotic heart disease of native coronary artery with unspecified angina pectoris: Secondary | ICD-10-CM | POA: Diagnosis present

## 2022-01-09 DIAGNOSIS — R0602 Shortness of breath: Secondary | ICD-10-CM | POA: Diagnosis present

## 2022-01-09 DIAGNOSIS — E8770 Fluid overload, unspecified: Secondary | ICD-10-CM | POA: Diagnosis not present

## 2022-01-09 DIAGNOSIS — I5021 Acute systolic (congestive) heart failure: Secondary | ICD-10-CM | POA: Diagnosis not present

## 2022-01-09 DIAGNOSIS — I161 Hypertensive emergency: Secondary | ICD-10-CM | POA: Diagnosis present

## 2022-01-09 DIAGNOSIS — R34 Anuria and oliguria: Secondary | ICD-10-CM | POA: Diagnosis present

## 2022-01-09 DIAGNOSIS — R609 Edema, unspecified: Secondary | ICD-10-CM | POA: Diagnosis not present

## 2022-01-09 DIAGNOSIS — E669 Obesity, unspecified: Secondary | ICD-10-CM | POA: Diagnosis present

## 2022-01-09 DIAGNOSIS — I4891 Unspecified atrial fibrillation: Secondary | ICD-10-CM | POA: Diagnosis not present

## 2022-01-09 DIAGNOSIS — Z885 Allergy status to narcotic agent status: Secondary | ICD-10-CM | POA: Diagnosis not present

## 2022-01-09 DIAGNOSIS — J9811 Atelectasis: Secondary | ICD-10-CM | POA: Diagnosis not present

## 2022-01-09 DIAGNOSIS — Z7189 Other specified counseling: Secondary | ICD-10-CM | POA: Diagnosis not present

## 2022-01-09 DIAGNOSIS — J8489 Other specified interstitial pulmonary diseases: Secondary | ICD-10-CM | POA: Diagnosis not present

## 2022-01-09 DIAGNOSIS — I340 Nonrheumatic mitral (valve) insufficiency: Secondary | ICD-10-CM | POA: Diagnosis present

## 2022-01-09 DIAGNOSIS — I272 Pulmonary hypertension, unspecified: Secondary | ICD-10-CM | POA: Diagnosis present

## 2022-01-09 DIAGNOSIS — Z466 Encounter for fitting and adjustment of urinary device: Secondary | ICD-10-CM | POA: Diagnosis not present

## 2022-01-09 DIAGNOSIS — J811 Chronic pulmonary edema: Secondary | ICD-10-CM | POA: Diagnosis not present

## 2022-01-09 DIAGNOSIS — I2102 ST elevation (STEMI) myocardial infarction involving left anterior descending coronary artery: Secondary | ICD-10-CM | POA: Diagnosis not present

## 2022-01-09 DIAGNOSIS — D649 Anemia, unspecified: Secondary | ICD-10-CM | POA: Diagnosis not present

## 2022-01-09 DIAGNOSIS — I83813 Varicose veins of bilateral lower extremities with pain: Secondary | ICD-10-CM | POA: Diagnosis not present

## 2022-01-09 DIAGNOSIS — I5041 Acute combined systolic (congestive) and diastolic (congestive) heart failure: Secondary | ICD-10-CM | POA: Diagnosis not present

## 2022-01-09 DIAGNOSIS — Z515 Encounter for palliative care: Secondary | ICD-10-CM | POA: Diagnosis not present

## 2022-01-09 DIAGNOSIS — Z20822 Contact with and (suspected) exposure to covid-19: Secondary | ICD-10-CM | POA: Diagnosis present

## 2022-01-09 DIAGNOSIS — E871 Hypo-osmolality and hyponatremia: Secondary | ICD-10-CM | POA: Diagnosis not present

## 2022-01-09 DIAGNOSIS — I213 ST elevation (STEMI) myocardial infarction of unspecified site: Secondary | ICD-10-CM | POA: Diagnosis present

## 2022-01-09 DIAGNOSIS — I2119 ST elevation (STEMI) myocardial infarction involving other coronary artery of inferior wall: Secondary | ICD-10-CM | POA: Diagnosis not present

## 2022-01-09 DIAGNOSIS — I517 Cardiomegaly: Secondary | ICD-10-CM | POA: Diagnosis not present

## 2022-01-09 DIAGNOSIS — J9601 Acute respiratory failure with hypoxia: Secondary | ICD-10-CM | POA: Diagnosis present

## 2022-01-09 DIAGNOSIS — I10 Essential (primary) hypertension: Secondary | ICD-10-CM | POA: Diagnosis not present

## 2022-01-09 DIAGNOSIS — J449 Chronic obstructive pulmonary disease, unspecified: Secondary | ICD-10-CM | POA: Diagnosis present

## 2022-01-09 DIAGNOSIS — Z01810 Encounter for preprocedural cardiovascular examination: Secondary | ICD-10-CM | POA: Diagnosis not present

## 2022-01-09 DIAGNOSIS — D631 Anemia in chronic kidney disease: Secondary | ICD-10-CM | POA: Diagnosis present

## 2022-01-09 DIAGNOSIS — N179 Acute kidney failure, unspecified: Secondary | ICD-10-CM | POA: Diagnosis not present

## 2022-01-09 DIAGNOSIS — R0902 Hypoxemia: Secondary | ICD-10-CM | POA: Diagnosis not present

## 2022-01-09 DIAGNOSIS — Z452 Encounter for adjustment and management of vascular access device: Secondary | ICD-10-CM | POA: Diagnosis not present

## 2022-01-09 LAB — CBC WITH DIFFERENTIAL/PLATELET
Abs Immature Granulocytes: 0.14 10*3/uL — ABNORMAL HIGH (ref 0.00–0.07)
Basophils Absolute: 0.1 10*3/uL (ref 0.0–0.1)
Basophils Relative: 1 %
Eosinophils Absolute: 0.4 10*3/uL (ref 0.0–0.5)
Eosinophils Relative: 2 %
HCT: 45.8 % (ref 36.0–46.0)
Hemoglobin: 14.9 g/dL (ref 12.0–15.0)
Immature Granulocytes: 1 %
Lymphocytes Relative: 23 %
Lymphs Abs: 5.2 10*3/uL — ABNORMAL HIGH (ref 0.7–4.0)
MCH: 30.8 pg (ref 26.0–34.0)
MCHC: 32.5 g/dL (ref 30.0–36.0)
MCV: 94.6 fL (ref 80.0–100.0)
Monocytes Absolute: 1.2 10*3/uL — ABNORMAL HIGH (ref 0.1–1.0)
Monocytes Relative: 5 %
Neutro Abs: 15.8 10*3/uL — ABNORMAL HIGH (ref 1.7–7.7)
Neutrophils Relative %: 68 %
Platelets: 390 10*3/uL (ref 150–400)
RBC: 4.84 MIL/uL (ref 3.87–5.11)
RDW: 14.1 % (ref 11.5–15.5)
WBC: 22.8 10*3/uL — ABNORMAL HIGH (ref 4.0–10.5)
nRBC: 0 % (ref 0.0–0.2)

## 2022-01-09 LAB — POCT I-STAT, CHEM 8
BUN: 13 mg/dL (ref 8–23)
Calcium, Ion: 1.26 mmol/L (ref 1.15–1.40)
Chloride: 105 mmol/L (ref 98–111)
Creatinine, Ser: 1.1 mg/dL — ABNORMAL HIGH (ref 0.44–1.00)
Glucose, Bld: 216 mg/dL — ABNORMAL HIGH (ref 70–99)
HCT: 44 % (ref 36.0–46.0)
Hemoglobin: 15 g/dL (ref 12.0–15.0)
Potassium: 5.5 mmol/L — ABNORMAL HIGH (ref 3.5–5.1)
Sodium: 135 mmol/L (ref 135–145)
TCO2: 23 mmol/L (ref 22–32)

## 2022-01-09 LAB — ECHOCARDIOGRAM COMPLETE
AR max vel: 2.11 cm2
AV Area VTI: 2.16 cm2
AV Area mean vel: 2.04 cm2
AV Mean grad: 3 mmHg
AV Peak grad: 4.8 mmHg
Ao pk vel: 1.1 m/s
Area-P 1/2: 3.1 cm2
Calc EF: 22.8 %
Height: 66 in
MV M vel: 4.38 m/s
MV Peak grad: 76.7 mmHg
S' Lateral: 3.6 cm
Single Plane A2C EF: 33 %
Single Plane A4C EF: 13.5 %
Weight: 3971.81 oz

## 2022-01-09 LAB — POCT I-STAT EG7
Acid-base deficit: 5 mmol/L — ABNORMAL HIGH (ref 0.0–2.0)
Acid-base deficit: 6 mmol/L — ABNORMAL HIGH (ref 0.0–2.0)
Bicarbonate: 22.2 mmol/L (ref 20.0–28.0)
Bicarbonate: 22.8 mmol/L (ref 20.0–28.0)
Calcium, Ion: 1.15 mmol/L (ref 1.15–1.40)
Calcium, Ion: 1.25 mmol/L (ref 1.15–1.40)
HCT: 40 % (ref 36.0–46.0)
HCT: 42 % (ref 36.0–46.0)
Hemoglobin: 13.6 g/dL (ref 12.0–15.0)
Hemoglobin: 14.3 g/dL (ref 12.0–15.0)
O2 Saturation: 62 %
O2 Saturation: 63 %
Potassium: 5 mmol/L (ref 3.5–5.1)
Potassium: 5.3 mmol/L — ABNORMAL HIGH (ref 3.5–5.1)
Sodium: 134 mmol/L — ABNORMAL LOW (ref 135–145)
Sodium: 136 mmol/L (ref 135–145)
TCO2: 24 mmol/L (ref 22–32)
TCO2: 24 mmol/L (ref 22–32)
pCO2, Ven: 51.6 mmHg (ref 44–60)
pCO2, Ven: 53 mmHg (ref 44–60)
pH, Ven: 7.242 — ABNORMAL LOW (ref 7.25–7.43)
pH, Ven: 7.243 — ABNORMAL LOW (ref 7.25–7.43)
pO2, Ven: 38 mmHg (ref 32–45)
pO2, Ven: 38 mmHg (ref 32–45)

## 2022-01-09 LAB — COMPREHENSIVE METABOLIC PANEL
ALT: 23 U/L (ref 0–44)
AST: 32 U/L (ref 15–41)
Albumin: 4.3 g/dL (ref 3.5–5.0)
Alkaline Phosphatase: 111 U/L (ref 38–126)
Anion gap: 7 (ref 5–15)
BUN: 13 mg/dL (ref 8–23)
CO2: 23 mmol/L (ref 22–32)
Calcium: 9.4 mg/dL (ref 8.9–10.3)
Chloride: 106 mmol/L (ref 98–111)
Creatinine, Ser: 1.11 mg/dL — ABNORMAL HIGH (ref 0.44–1.00)
GFR, Estimated: 49 mL/min — ABNORMAL LOW (ref >60)
Glucose, Bld: 239 mg/dL — ABNORMAL HIGH (ref 70–99)
Potassium: 5.6 mmol/L — ABNORMAL HIGH (ref 3.5–5.1)
Sodium: 136 mmol/L (ref 135–145)
Total Bilirubin: 0.8 mg/dL (ref 0.3–1.2)
Total Protein: 7.8 g/dL (ref 6.5–8.1)

## 2022-01-09 LAB — COOXEMETRY PANEL
Carboxyhemoglobin: 1.2 % (ref 0.5–1.5)
Methemoglobin: <0.7 % (ref 0.0–1.5)
O2 Saturation: 62.1 %
Total hemoglobin: 12.7 g/dL (ref 12.0–16.0)

## 2022-01-09 LAB — CBC
HCT: 41.4 % (ref 36.0–46.0)
Hemoglobin: 13.7 g/dL (ref 12.0–15.0)
MCH: 30.5 pg (ref 26.0–34.0)
MCHC: 33.1 g/dL (ref 30.0–36.0)
MCV: 92.2 fL (ref 80.0–100.0)
Platelets: 328 10*3/uL (ref 150–400)
RBC: 4.49 MIL/uL (ref 3.87–5.11)
RDW: 13.9 % (ref 11.5–15.5)
WBC: 21.5 10*3/uL — ABNORMAL HIGH (ref 4.0–10.5)
nRBC: 0 % (ref 0.0–0.2)

## 2022-01-09 LAB — BASIC METABOLIC PANEL
Anion gap: 9 (ref 5–15)
Anion gap: 12 (ref 5–15)
Anion gap: 10 (ref 5–15)
BUN: 14 mg/dL (ref 8–23)
BUN: 19 mg/dL (ref 8–23)
BUN: 21 mg/dL (ref 8–23)
CO2: 18 mmol/L — ABNORMAL LOW (ref 22–32)
CO2: 19 mmol/L — ABNORMAL LOW (ref 22–32)
CO2: 22 mmol/L (ref 22–32)
Calcium: 9.2 mg/dL (ref 8.9–10.3)
Calcium: 9.3 mg/dL (ref 8.9–10.3)
Calcium: 9 mg/dL (ref 8.9–10.3)
Chloride: 106 mmol/L (ref 98–111)
Chloride: 102 mmol/L (ref 98–111)
Chloride: 100 mmol/L (ref 98–111)
Creatinine, Ser: 1.33 mg/dL — ABNORMAL HIGH (ref 0.44–1.00)
Creatinine, Ser: 1.65 mg/dL — ABNORMAL HIGH (ref 0.44–1.00)
Creatinine, Ser: 1.65 mg/dL — ABNORMAL HIGH (ref 0.44–1.00)
GFR, Estimated: 40 mL/min — ABNORMAL LOW (ref >60)
GFR, Estimated: 31 mL/min — ABNORMAL LOW (ref >60)
GFR, Estimated: 31 mL/min — ABNORMAL LOW (ref >60)
Glucose, Bld: 175 mg/dL — ABNORMAL HIGH (ref 70–99)
Glucose, Bld: 123 mg/dL — ABNORMAL HIGH (ref 70–99)
Glucose, Bld: 129 mg/dL — ABNORMAL HIGH (ref 70–99)
Potassium: 5.8 mmol/L — ABNORMAL HIGH (ref 3.5–5.1)
Potassium: 5.7 mmol/L — ABNORMAL HIGH (ref 3.5–5.1)
Potassium: 4.8 mmol/L (ref 3.5–5.1)
Sodium: 133 mmol/L — ABNORMAL LOW (ref 135–145)
Sodium: 133 mmol/L — ABNORMAL LOW (ref 135–145)
Sodium: 132 mmol/L — ABNORMAL LOW (ref 135–145)

## 2022-01-09 LAB — SURGICAL PCR SCREEN
MRSA, PCR: NEGATIVE
Staphylococcus aureus: NEGATIVE

## 2022-01-09 LAB — BLOOD GAS, VENOUS
Acid-base deficit: 5.2 mmol/L — ABNORMAL HIGH (ref 0.0–2.0)
Bicarbonate: 23.8 mmol/L (ref 20.0–28.0)
O2 Saturation: 92.1 %
Patient temperature: 37
pCO2, Ven: 61 mmHg — ABNORMAL HIGH (ref 44–60)
pH, Ven: 7.2 — ABNORMAL LOW (ref 7.25–7.43)
pO2, Ven: 69 mmHg — ABNORMAL HIGH (ref 32–45)

## 2022-01-09 LAB — HEPARIN LEVEL (UNFRACTIONATED)
Heparin Unfractionated: 0.44 IU/mL (ref 0.30–0.70)
Heparin Unfractionated: 0.4 IU/mL (ref 0.30–0.70)

## 2022-01-09 LAB — RESP PANEL BY RT-PCR (FLU A&B, COVID) ARPGX2
Influenza A by PCR: NEGATIVE
Influenza B by PCR: NEGATIVE
SARS Coronavirus 2 by RT PCR: NEGATIVE

## 2022-01-09 LAB — LIPID PANEL
Cholesterol: 268 mg/dL — ABNORMAL HIGH (ref 0–200)
HDL: 74 mg/dL (ref >40)
LDL Cholesterol: 164 mg/dL — ABNORMAL HIGH (ref 0–99)
Total CHOL/HDL Ratio: 3.6 RATIO
Triglycerides: 148 mg/dL (ref <150)
VLDL: 30 mg/dL (ref 0–40)

## 2022-01-09 LAB — BRAIN NATRIURETIC PEPTIDE: B Natriuretic Peptide: 227.4 pg/mL — ABNORMAL HIGH (ref 0.0–100.0)

## 2022-01-09 LAB — HEMOGLOBIN A1C
Hgb A1c MFr Bld: 5.7 % — ABNORMAL HIGH (ref 4.8–5.6)
Mean Plasma Glucose: 116.89 mg/dL

## 2022-01-09 LAB — TSH: TSH: 1.906 u[IU]/mL (ref 0.350–4.500)

## 2022-01-09 LAB — TROPONIN I (HIGH SENSITIVITY)
Troponin I (High Sensitivity): 1967 ng/L (ref <18)
Troponin I (High Sensitivity): 2674 ng/L (ref <18)
Troponin I (High Sensitivity): 2965 ng/L (ref <18)
Troponin I (High Sensitivity): 3557 ng/L (ref <18)

## 2022-01-09 LAB — PROTIME-INR
INR: 0.9 (ref 0.8–1.2)
Prothrombin Time: 12.1 seconds (ref 11.4–15.2)

## 2022-01-09 LAB — APTT: aPTT: 29 seconds (ref 24–36)

## 2022-01-09 LAB — POCT ACTIVATED CLOTTING TIME: Activated Clotting Time: 167 seconds

## 2022-01-09 MED ORDER — ATORVASTATIN CALCIUM 40 MG PO TABS
40.0000 mg | ORAL_TABLET | Freq: Every day | ORAL | Status: DC
  Administered 2022-01-09: 40 mg via ORAL
  Filled 2022-01-09: qty 1

## 2022-01-09 MED ORDER — HEPARIN SODIUM (PORCINE) 5000 UNIT/ML IJ SOLN
4000.0000 [IU] | Freq: Once | INTRAMUSCULAR | Status: AC
  Administered 2022-01-09: 4000 [IU] via INTRAVENOUS
  Filled 2022-01-09: qty 1

## 2022-01-09 MED ORDER — LIDOCAINE HCL (PF) 1 % IJ SOLN
INTRAMUSCULAR | Status: AC
  Filled 2022-01-09: qty 30

## 2022-01-09 MED ORDER — ALBUTEROL SULFATE (2.5 MG/3ML) 0.083% IN NEBU
2.5000 mg | INHALATION_SOLUTION | RESPIRATORY_TRACT | Status: DC | PRN
  Filled 2022-01-09: qty 3

## 2022-01-09 MED ORDER — ACETAMINOPHEN 325 MG PO TABS
650.0000 mg | ORAL_TABLET | ORAL | Status: DC | PRN
  Administered 2022-01-09 – 2022-01-22 (×25): 650 mg via ORAL
  Filled 2022-01-09 (×25): qty 2

## 2022-01-09 MED ORDER — VERAPAMIL HCL 2.5 MG/ML IV SOLN
INTRAVENOUS | Status: AC
  Filled 2022-01-09: qty 2

## 2022-01-09 MED ORDER — CHLORHEXIDINE GLUCONATE CLOTH 2 % EX PADS
6.0000 | MEDICATED_PAD | Freq: Every day | CUTANEOUS | Status: DC
  Administered 2022-01-10 – 2022-01-22 (×12): 6 via TOPICAL

## 2022-01-09 MED ORDER — HEPARIN SODIUM (PORCINE) 1000 UNIT/ML IJ SOLN
INTRAMUSCULAR | Status: DC | PRN
  Administered 2022-01-09: 4000 [IU] via INTRAVENOUS

## 2022-01-09 MED ORDER — SODIUM CHLORIDE 0.9% FLUSH
10.0000 mL | INTRAVENOUS | Status: DC | PRN
  Administered 2022-01-13: 10 mL

## 2022-01-09 MED ORDER — SODIUM ZIRCONIUM CYCLOSILICATE 5 G PO PACK
5.0000 g | PACK | Freq: Once | ORAL | Status: AC
  Administered 2022-01-09: 5 g via ORAL
  Filled 2022-01-09: qty 1

## 2022-01-09 MED ORDER — STERILE WATER FOR INJECTION IJ SOLN
INTRAMUSCULAR | Status: AC
  Administered 2022-01-09: 2 mL
  Filled 2022-01-09: qty 10

## 2022-01-09 MED ORDER — ATORVASTATIN CALCIUM 80 MG PO TABS
80.0000 mg | ORAL_TABLET | Freq: Every day | ORAL | Status: DC
  Administered 2022-01-10 – 2022-01-22 (×13): 80 mg via ORAL
  Filled 2022-01-09 (×13): qty 1

## 2022-01-09 MED ORDER — HEPARIN SODIUM (PORCINE) 1000 UNIT/ML IJ SOLN
INTRAMUSCULAR | Status: AC
  Filled 2022-01-09: qty 10

## 2022-01-09 MED ORDER — AMIODARONE HCL IN DEXTROSE 360-4.14 MG/200ML-% IV SOLN
30.0000 mg/h | INTRAVENOUS | Status: DC
Start: 2022-01-09 — End: 2022-01-16
  Administered 2022-01-09 (×3): 30 mg/h via INTRAVENOUS
  Administered 2022-01-10 – 2022-01-13 (×9): 60 mg/h via INTRAVENOUS
  Administered 2022-01-14: 30 mg/h via INTRAVENOUS
  Administered 2022-01-14: 60 mg/h via INTRAVENOUS
  Administered 2022-01-15 – 2022-01-16 (×3): 30 mg/h via INTRAVENOUS
  Filled 2022-01-09: qty 400
  Filled 2022-01-09 (×8): qty 200
  Filled 2022-01-09 (×2): qty 400
  Filled 2022-01-09 (×9): qty 200

## 2022-01-09 MED ORDER — METOPROLOL TARTRATE 5 MG/5ML IV SOLN
2.5000 mg | Freq: Once | INTRAVENOUS | Status: AC
  Administered 2022-01-09: 2.5 mg via INTRAVENOUS
  Filled 2022-01-09: qty 5

## 2022-01-09 MED ORDER — HEPARIN (PORCINE) IN NACL 1000-0.9 UT/500ML-% IV SOLN
INTRAVENOUS | Status: AC
  Filled 2022-01-09: qty 1000

## 2022-01-09 MED ORDER — ASPIRIN 81 MG PO CHEW
324.0000 mg | CHEWABLE_TABLET | Freq: Once | ORAL | Status: DC
  Filled 2022-01-09: qty 4

## 2022-01-09 MED ORDER — LIDOCAINE HCL (PF) 1 % IJ SOLN
INTRAMUSCULAR | Status: DC | PRN
  Administered 2022-01-09 (×2): 2 mL via INTRADERMAL

## 2022-01-09 MED ORDER — NICOTINE 21 MG/24HR TD PT24
21.0000 mg | MEDICATED_PATCH | Freq: Every day | TRANSDERMAL | Status: DC
  Administered 2022-01-09 – 2022-01-10 (×2): 21 mg via TRANSDERMAL
  Filled 2022-01-09 (×2): qty 1

## 2022-01-09 MED ORDER — METOPROLOL TARTRATE 12.5 MG HALF TABLET
12.5000 mg | ORAL_TABLET | Freq: Two times a day (BID) | ORAL | Status: DC
  Administered 2022-01-09 (×2): 12.5 mg via ORAL
  Filled 2022-01-09 (×2): qty 1

## 2022-01-09 MED ORDER — SODIUM CHLORIDE 0.9 % IV SOLN
250.0000 mL | INTRAVENOUS | Status: DC | PRN
  Administered 2022-01-14: 250 mL via INTRAVENOUS

## 2022-01-09 MED ORDER — LABETALOL HCL 5 MG/ML IV SOLN: 10.0000 mg | INTRAVENOUS | Status: AC | PRN

## 2022-01-09 MED ORDER — AMIODARONE HCL IN DEXTROSE 360-4.14 MG/200ML-% IV SOLN
60.0000 mg/h | INTRAVENOUS | Status: AC
  Administered 2022-01-09 (×2): 60 mg/h via INTRAVENOUS
  Filled 2022-01-09: qty 200

## 2022-01-09 MED ORDER — HEPARIN (PORCINE) IN NACL 1000-0.9 UT/500ML-% IV SOLN
INTRAVENOUS | Status: DC | PRN
Start: 2022-01-09 — End: 2022-01-09
  Administered 2022-01-09 (×2): 500 mL

## 2022-01-09 MED ORDER — SODIUM CHLORIDE 0.9 % IV SOLN: INTRAVENOUS | Status: DC | PRN

## 2022-01-09 MED ORDER — SODIUM CHLORIDE 0.9 % IV SOLN: INTRAVENOUS | Status: AC

## 2022-01-09 MED ORDER — HEPARIN SODIUM (PORCINE) 5000 UNIT/ML IJ SOLN: 5000.0000 [IU] | Freq: Once | INTRAMUSCULAR | Status: DC

## 2022-01-09 MED ORDER — DIAZEPAM 5 MG PO TABS
10.0000 mg | ORAL_TABLET | Freq: Three times a day (TID) | ORAL | Status: DC | PRN
  Administered 2022-01-09 – 2022-01-12 (×8): 10 mg via ORAL
  Filled 2022-01-09 (×9): qty 2

## 2022-01-09 MED ORDER — SODIUM CHLORIDE 0.9% FLUSH
10.0000 mL | Freq: Two times a day (BID) | INTRAVENOUS | Status: DC
  Administered 2022-01-09 – 2022-01-22 (×21): 10 mL

## 2022-01-09 MED ORDER — FUROSEMIDE 10 MG/ML IJ SOLN
40.0000 mg | Freq: Once | INTRAMUSCULAR | Status: AC
  Administered 2022-01-09: 40 mg via INTRAVENOUS
  Filled 2022-01-09: qty 4

## 2022-01-09 MED ORDER — ZOLPIDEM TARTRATE 5 MG PO TABS
5.0000 mg | ORAL_TABLET | Freq: Every evening | ORAL | Status: DC | PRN
  Administered 2022-01-13 – 2022-01-21 (×4): 5 mg via ORAL
  Filled 2022-01-09 (×6): qty 1

## 2022-01-09 MED ORDER — SODIUM CHLORIDE 0.9 % IV SOLN: INTRAVENOUS | Status: DC

## 2022-01-09 MED ORDER — MONTELUKAST SODIUM 10 MG PO TABS
10.0000 mg | ORAL_TABLET | Freq: Every day | ORAL | Status: DC
  Administered 2022-01-09 – 2022-01-21 (×13): 10 mg via ORAL
  Filled 2022-01-09 (×13): qty 1

## 2022-01-09 MED ORDER — FUROSEMIDE 10 MG/ML IJ SOLN
12.0000 mg/h | INTRAVENOUS | Status: DC
  Administered 2022-01-09 – 2022-01-10 (×2): 12 mg/h via INTRAVENOUS
  Filled 2022-01-09 (×3): qty 20

## 2022-01-09 MED ORDER — SODIUM CHLORIDE 0.9% FLUSH: 3.0000 mL | INTRAVENOUS | Status: DC | PRN

## 2022-01-09 MED ORDER — ASPIRIN 81 MG PO TBEC: 81.0000 mg | DELAYED_RELEASE_TABLET | Freq: Every day | ORAL | Status: DC

## 2022-01-09 MED ORDER — ONDANSETRON HCL 4 MG/2ML IJ SOLN
4.0000 mg | Freq: Four times a day (QID) | INTRAMUSCULAR | Status: DC | PRN
  Administered 2022-01-12 – 2022-01-14 (×3): 4 mg via INTRAVENOUS
  Filled 2022-01-09 (×3): qty 2

## 2022-01-09 MED ORDER — IOHEXOL 350 MG/ML SOLN
INTRAVENOUS | Status: DC | PRN
  Administered 2022-01-09: 70 mL

## 2022-01-09 MED ORDER — FUROSEMIDE 10 MG/ML IJ SOLN
80.0000 mg | Freq: Once | INTRAMUSCULAR | Status: AC
  Administered 2022-01-09: 80 mg via INTRAVENOUS
  Filled 2022-01-09: qty 8

## 2022-01-09 MED ORDER — MILRINONE LACTATE IN DEXTROSE 20-5 MG/100ML-% IV SOLN
0.2500 ug/kg/min | INTRAVENOUS | Status: DC
  Administered 2022-01-09 (×2): 0.25 ug/kg/min via INTRAVENOUS
  Filled 2022-01-09 (×2): qty 100

## 2022-01-09 MED ORDER — FAMOTIDINE 20 MG PO TABS
20.0000 mg | ORAL_TABLET | Freq: Two times a day (BID) | ORAL | Status: DC
  Administered 2022-01-09 – 2022-01-11 (×5): 20 mg via ORAL
  Filled 2022-01-09 (×5): qty 1

## 2022-01-09 MED ORDER — FUROSEMIDE 10 MG/ML IJ SOLN
40.0000 mg | Freq: Two times a day (BID) | INTRAMUSCULAR | Status: DC
  Administered 2022-01-09 (×2): 40 mg via INTRAVENOUS
  Filled 2022-01-09 (×2): qty 4

## 2022-01-09 MED ORDER — HEPARIN (PORCINE) 25000 UT/250ML-% IV SOLN
1400.0000 [IU]/h | INTRAVENOUS | Status: DC
Start: 2022-01-09 — End: 2022-01-20
  Administered 2022-01-09 – 2022-01-10 (×2): 1200 [IU]/h via INTRAVENOUS
  Administered 2022-01-11 – 2022-01-12 (×2): 1400 [IU]/h via INTRAVENOUS
  Administered 2022-01-13 – 2022-01-16 (×5): 1350 [IU]/h via INTRAVENOUS
  Administered 2022-01-17 – 2022-01-19 (×4): 1400 [IU]/h via INTRAVENOUS
  Filled 2022-01-09 (×16): qty 250

## 2022-01-09 MED ORDER — ASPIRIN 81 MG PO CHEW
81.0000 mg | CHEWABLE_TABLET | Freq: Every day | ORAL | Status: DC
  Administered 2022-01-09 – 2022-01-22 (×14): 81 mg via ORAL
  Filled 2022-01-09 (×14): qty 1

## 2022-01-09 MED ORDER — SODIUM CHLORIDE 0.9% FLUSH
3.0000 mL | Freq: Two times a day (BID) | INTRAVENOUS | Status: DC
  Administered 2022-01-09 – 2022-01-21 (×21): 3 mL via INTRAVENOUS

## 2022-01-09 NOTE — Progress Notes (Signed)
ANTICOAGULATION CONSULT NOTE - Follow Up Consult  Pharmacy Consult for heparin Indication: chest pain/ACS and atrial fibrillation  Allergies  Allergen Reactions   Codeine Other (See Comments)    Unknown   Meprobamate Swelling   Sulfa Antibiotics Other (See Comments)    Unknown    Patient Measurements: Height: '5\' 6"'$  (167.6 cm) Weight: 112.6 kg (248 lb 3.8 oz) IBW/kg (Calculated) : 59.3 Heparin Dosing Weight: 85.7 kg  Vital Signs: Temp: 98.3 F (36.8 C) (05/24 0836) Temp Source: Oral (05/24 0836) BP: 110/63 (05/24 1415) Pulse Rate: 70 (05/24 1415)  Labs: Recent Labs    01/08/22 2356 01/09/22 0017 01/09/22 0133 01/09/22 0158 01/09/22 0201 01/09/22 0314 01/09/22 0322 01/09/22 0534 01/09/22 1420  HGB 14.9  --  15.0 13.6 14.3 13.7  --   --   --   HCT 45.8  --  44.0 40.0 42.0 41.4  --   --   --   PLT 390  --   --   --   --  328  --   --   --   APTT  --  29  --   --   --   --   --   --   --   LABPROT  --  12.1  --   --   --   --   --   --   --   INR  --  0.9  --   --   --   --   --   --   --   HEPARINUNFRC  --   --   --   --   --   --   --   --  0.44  CREATININE 1.11*  --  1.10*  --   --  1.33*  --   --   --   TROPONINIHS 1,967*  --   --   --   --   --  2,674* 2,965*  --     Estimated Creatinine Clearance: 40.8 mL/min (A) (by C-G formula based on SCr of 1.33 mg/dL (H)).  Assessment: 81 y.o. female admitted with Afib and STEMI s/p cath and awaiting CABG versus high-risk PCI. Patient is not on any anticoagulants PTA. Pharmacy has been consulted for heparin.  First heparin level is therapeutic at 0.44. CBC is stable and within normal limits. No issues with bleeding or infusion noted.   Goal of Therapy:  Heparin level 0.3-0.7 units/ml Monitor platelets by anticoagulation protocol: Yes   Plan:  Continue heparin 1200 units/hr Check a confirmatory heparin level in ~8h Daily heparin level and CBC Monitor for s/sx of bleeding  Thank you for including pharmacy in the  care of this patient.  Zenaida Deed, PharmD PGY1 Acute Care Pharmacy Resident  Phone: [phone] 01/09/2022  3:30 PM  Please check [email or web address] for unit-specific pharmacy phone numbers.

## 2022-01-09 NOTE — ED Notes (Signed)
CODE STEMI called by EDP, Rees,MD.

## 2022-01-09 NOTE — Procedures (Signed)
Arterial Catheter Insertion Procedure Note  Brittany Jackson  [id number]  [birth year]  Date:01/09/22  Time:11:24 PM    Provider Performing: Ulice Dash    Procedure: Insertion of Arterial Line [phone]) with US guidance (62130)   Indication(s) Blood pressure monitoring and/or need for frequent ABGs  Consent Risks of the procedure as well as the alternatives and risks of each were explained to the patient and/or caregiver.  Consent for the procedure was obtained and is signed in the bedside chart  Anesthesia None   Time Out Verified patient identification, verified procedure, site/side was marked, verified correct patient position, special equipment/implants available, medications/allergies/relevant history reviewed, required imaging and test results available.   Sterile Technique Maximal sterile technique including full sterile barrier drape, hand hygiene, sterile gown, sterile gloves, mask, hair covering, sterile ultrasound probe cover (if used).   Procedure Description Area of catheter insertion was cleaned with chlorhexidine and draped in sterile fashion. With real-time ultrasound guidance an arterial catheter was placed into the left radial artery.  Appropriate arterial tracings confirmed on monitor.     Complications/Tolerance None; patient tolerated the procedure well.   EBL Minimal   Specimen(s) None

## 2022-01-09 NOTE — H&P (Addendum)
Cardiology Admission History and Physical:   Patient ID: ARA GRANDMAISON MRN: [id number]; DOB: 02/13/1941   Admission date: 01/08/2022  PCP:  Redmond School, MD   [facility] Providers Cardiologist:  None        Chief Complaint:  Shortness of breath  Patient Profile:   Brittany Jackson is a 81 y.o. female with a PMHx of hypertension, tobacco abuse and urticaria who is being seen 01/09/2022 for the evaluation of Shortness of breath.  History of Present Illness:   Brittany Jackson is transferred from [facility] with BiPap. Limited medical history was obtained from medical staff and records. Of note, patient started having shortness of breath and chest pain while laying mulch in her yard late yesterday afternoon. Her dyspnea has progressively worsening and she called EMS and brought her to the [facility] ED. When patient arrived at OSH, she was tachycardia, tachypnea and hypertensive (191/173mHg). Patient was started nitro gtt and placed on Bipap. [facility] due to tachycardia. She was given 2.'5mg'$  IV metoprolol and her HR improved, and EKG demonstrated inferolateral STEMI. Patient was transferred for emergent coronary angiogram/angioplasty.  At OSH K 5.6, cR 1.11, BNP 227.4, troponin 1967, WBC 22.8, Hgb (-) CXR reported cardiomegaly and pulmonary edema Past Medical History:  Diagnosis Date   CHF (congestive heart failure) (HCC)    Hypertension    Urticaria     Past Surgical History:  Procedure Laterality Date   APPENDECTOMY     BACK SURGERY     HUMERUS IM NAIL Left 08/24/2013   Procedure: INTRAMEDULLARY (IM) NAIL HUMERAL;  Surgeon: JNita Sells MD;  [facility]: [facility]  Service: Orthopedics;  Laterality: Left;   TUBAL LIGATION       Medications Prior to Admission: Prior to Admission medications   Medication Sig Start Date End Date Taking? Authorizing Provider  albuterol (PROVENTIL) 4 MG tablet Take by mouth.  02/15/20   [provider]   aspirin EC 81 MG tablet Take 81 mg by mouth daily.    [provider]  cetirizine (ZYRTEC) 10 MG tablet Take 1-2 tablets 2 times per day maximum. 04/04/20   Kozlow, EDonnamarie Poag MD  cetirizine (ZYRTEC) 10 MG tablet Take 2 tablets 2 times daily. 07/18/20   Kozlow, EDonnamarie Poag MD  diazepam (VALIUM) 10 MG tablet Take 10 mg by mouth 3 (three) times daily as needed. 06/09/20   [provider]  famotidine (PEPCID) 20 MG tablet Take 1 tablet (20 mg total) by mouth 2 (two) times daily. 07/20/20   Kozlow, EDonnamarie Poag MD  famotidine (PEPCID) 40 MG tablet Take 1 tablet (40 mg total) by mouth 2 (two) times daily. 07/18/20   Kozlow, EDonnamarie Poag MD  furosemide (LASIX) 20 MG tablet Take 0.5 tablets (10 mg total) by mouth every other day. 04/10/12 08/23/13  VVelvet Bathe MD  methocarbamol (ROBAXIN) 500 MG tablet Take 500 mg by mouth daily.    [provider]  mometasone (ELOCON) 0.1 % ointment 1 application to inflamed skin 1-2 times daily. 07/18/20   Kozlow, EDonnamarie Poag MD  montelukast (SINGULAIR) 10 MG tablet Take 1 tablet (10 mg total) by mouth at bedtime. 07/20/20   Kozlow, EDonnamarie Poag MD  quinapril (ACCUPRIL) 40 MG tablet Take 40 mg by mouth at bedtime.    [provider]     Allergies:    Allergies  Allergen Reactions   Codeine     Unknown   Sulfa Antibiotics  Unknown    Social History:   Social History   Socioeconomic History   Marital status: Widowed    Spouse name: Not on file   Number of children: Not on file   Years of education: Not on file   Highest education level: Not on file  Occupational History   Not on file  Tobacco Use   Smoking status: Every Day    Packs/day: 0.50    Types: Cigarettes   Smokeless tobacco: Never  Vaping Use   Vaping Use: Never used  Substance and Sexual Activity   Alcohol use: Yes    Comment: occasional   Drug use: No   Sexual activity: Never    Birth control/protection: Surgical  Other Topics Concern   Not on file  Social History  Narrative   Not on file   Social Determinants of Health   Financial Resource Strain: Not on file  Food Insecurity: Not on file  Transportation Needs: Not on file  Physical Activity: Not on file  Stress: Not on file  Social Connections: Not on file  Intimate Partner Violence: Not on file    Family History:   The patient's family history includes Hypertension in her sister. There is no history of Allergic rhinitis, Asthma, Angioedema, Atopy, Eczema, Immunodeficiency, or Urticaria.    ROS:  Please see the history of present illness.  All other ROS reviewed and negative.     Physical Exam/Data:   Vitals:   01/09/22 0011 01/09/22 0015 01/09/22 0030 01/09/22 0045  BP: (!) 133/99 98/60 108/65 (!) 106/53  Pulse: (!) 144 89 91 92  Resp: (!) 38 (!) 36 (!) 23 (!) 35  Temp:    97.8 F (36.6 C)  TempSrc:    Axillary  SpO2: 93% 91% 97% 97%  Weight:      Height:        Intake/Output Summary (Last 24 hours) at 01/09/2022 0132 Last data filed at 01/09/2022 0023 Gross per 24 hour  Intake 0.19 ml  Output --  Net 0.19 ml      01/08/2022   11:44 PM 06/16/2020    2:37 PM 04/04/2020    9:35 AM  Last 3 Weights  Weight (lbs) 248 lb 3.8 oz 248 lb 3.2 oz 251 lb 12.8 oz  Weight (kg) 112.6 kg 112.583 kg 114.216 kg     Body mass index is 40.07 kg/m.  General:  Well nourished, well developed, in no acute distress HEENT: normal Neck: no JVD Vascular: No carotid bruits; Distal pulses 2+ bilaterally   Cardiac:  normal S1, S2; RRR; no murmur  Lungs:  rales present  Abd: soft, nontender, no hepatomegaly  Ext: no edema Musculoskeletal:  No deformities, BUE and BLE strength normal and equal Skin: warm and dry  Neuro:  CNs 2-12 intact, no focal abnormalities noted Psych:  Normal affect     Relevant CV Studies:  Laboratory Data:  High Sensitivity Troponin:   Recent Labs  Lab 01/08/22 2356  TROPONINIHS 1,967*      Chemistry Recent Labs  Lab 01/08/22 2356  NA 136  K 5.6*  CL 106   CO2 23  GLUCOSE 239*  BUN 13  CREATININE 1.11*  CALCIUM 9.4  GFRNONAA 49*  ANIONGAP 7    Recent Labs  Lab 01/08/22 2356  PROT 7.8  ALBUMIN 4.3  AST 32  ALT 23  ALKPHOS 111  BILITOT 0.8   Lipids  Recent Labs  Lab 01/09/22 0017  CHOL 268*  TRIG 148  HDL 74  LDLCALC 164*  CHOLHDL 3.6   Hematology Recent Labs  Lab 01/08/22 2356  WBC 22.8*  RBC 4.84  HGB 14.9  HCT 45.8  MCV 94.6  MCH 30.8  MCHC 32.5  RDW 14.1  PLT 390   Thyroid No results for input(s): TSH, FREET4 in the last 168 hours. BNP Recent Labs  Lab 01/08/22 2356  BNP 227.4*    DDimer No results for input(s): DDIMER in the last 168 hours.   Radiology/Studies:  DG Chest Port 1 View  Result Date: 01/09/2022 CLINICAL DATA:  Shortness of breath. EXAM: PORTABLE CHEST 1 VIEW COMPARISON:  08/24/2013. FINDINGS: The heart is enlarged the mediastinal contour is within normal limits. Atherosclerotic calcification of the aorta is noted. The pulmonary vasculature is distended. There is diffuse interstitial prominence with patchy airspace disease predominantly in the mid to lower lung fields. No effusion or pneumothorax. No acute osseous abnormality. IMPRESSION: 1. Cardiomegaly with pulmonary vascular congestion. 2. Interstitial prominence bilaterally with patchy airspace disease at the lung bases, possible edema or infiltrate. Electronically Signed   By: Brett Fairy M.D.   On: 01/09/2022 00:22     Assessment and Plan:   #STEMI -ECG demonstrated inferolateral STEMI -emergent coronary angiogram -risk stratification with A1C and TSH -LDL 164, high-intensity statin -acquire a TTE -Further management after LHC  #Hypertensive emergency -on Nitro gtt at OSH   Risk Assessment/Risk Scores:    TIMI Risk Score for ST  Elevation MI:   The patient's TIMI risk score is 6, which indicates a 16.1% risk of all cause mortality at 30 days.   New York Heart Association (NYHA) Functional Class NYHA Class  III   Severity of Illness: The appropriate patient status for this patient is INPATIENT. Inpatient status is judged to be reasonable and necessary in order to provide the required intensity of service to ensure the patient's safety. The patient's presenting symptoms, physical exam findings, and initial radiographic and laboratory data in the context of their chronic comorbidities is felt to place them at high risk for further clinical deterioration. Furthermore, it is not anticipated that the patient will be medically stable for discharge from the hospital within 2 midnights of admission.   * I certify that at the point of admission it is my clinical judgment that the patient will require inpatient hospital care spanning beyond 2 midnights from the point of admission due to high intensity of service, high risk for further deterioration and high frequency of surveillance required.*   For questions or updates, please contact [facility] Please consult [email or web address] for contact info under     Signed, Laurice Record, MD  01/09/2022 1:33 AM     ATTENDING ATTESTATION  I have seen, examined and evaluated the patient this AM in the Cath Lab along with Dr. Alfred Levins.  After reviewing all the available data and chart, we discussed the patients laboratory, study & physical findings as well as symptoms in detail. I agree with his findings, examination as well as impression recommendations as per our discussion.    Very difficult to interpret EKGs.  Getting a complementary EKG took about 35 to 45 minutes with stabilization of the patient with tachycardia, significant artifact, increased work of breathing.  Finally when her heart rate was slowed down enough we could see that there is ST elevations in inferior and lateral leads concerning for STEMI.  She was initially on BiPAP and transferred over to [facility] as a code STEMI.  Hemodynamically stable with blood  pressure in the 150s upon arrival.  Was on high oxygen with  BiPAP on arrival.  Also from a rhythm standpoint going in and out of A-fib.  Plan was emergent catheterization.Suspect multivessel disease.  Will need formal echocardiogram, may need rhythm control to control A-fib.    Glenetta Hew, M.D., M.S. Interventional Cardiologist   Pager # [phone] Phone # [phone] [address] [place], [place] [address]

## 2022-01-09 NOTE — ED Notes (Signed)
[facility] called for transport, unavailable. [facility] in route to transport to [facility] ED or  [facility].

## 2022-01-09 NOTE — Progress Notes (Signed)
  Echocardiogram 2D Echocardiogram has been performed.  Joette Catching 01/09/2022, 1:18 PM

## 2022-01-09 NOTE — Progress Notes (Signed)
ANTICOAGULATION CONSULT NOTE - Initial Consult  Pharmacy Consult for Heparin Indication: chest pain/ACS and atrial fibrillation  Allergies  Allergen Reactions   Codeine     Unknown   Sulfa Antibiotics     Unknown    Patient Measurements: Height: '5\' 6"'$  (167.6 cm) Weight: 112.6 kg (248 lb 3.8 oz) IBW/kg (Calculated) : 59.3 Heparin Dosing Weight: 85 kg  Vital Signs: Temp: 98.3 F (36.8 C) (05/24 0420) Temp Source: Axillary (05/24 0420) BP: 132/82 (05/24 0315) Pulse Rate: 95 (05/24 0315)  Labs: Recent Labs    01/08/22 2356 01/09/22 0017 01/09/22 0133 01/09/22 0158 01/09/22 0201 01/09/22 0314 01/09/22 0322  HGB 14.9  --  15.0 13.6 14.3 13.7  --   HCT 45.8  --  44.0 40.0 42.0 41.4  --   PLT 390  --   --   --   --  328  --   APTT  --  29  --   --   --   --   --   LABPROT  --  12.1  --   --   --   --   --   INR  --  0.9  --   --   --   --   --   CREATININE 1.11*  --  1.10*  --   --  1.33*  --   TROPONINIHS 1,967*  --   --   --   --   --  2,674*    Estimated Creatinine Clearance: 40.8 mL/min (A) (by C-G formula based on SCr of 1.33 mg/dL (H)).   Medical History: Past Medical History:  Diagnosis Date   CHF (congestive heart failure) (HCC)    Hypertension    Urticaria     Medications:  Medications Prior to Admission  Medication Sig Dispense Refill Last Dose   albuterol (PROVENTIL) 4 MG tablet Take by mouth.       aspirin EC 81 MG tablet Take 81 mg by mouth daily.      cetirizine (ZYRTEC) 10 MG tablet Take 1-2 tablets 2 times per day maximum. 120 tablet 1    cetirizine (ZYRTEC) 10 MG tablet Take 2 tablets 2 times daily. 120 tablet 2    diazepam (VALIUM) 10 MG tablet Take 10 mg by mouth 3 (three) times daily as needed.      famotidine (PEPCID) 20 MG tablet Take 1 tablet (20 mg total) by mouth 2 (two) times daily. 180 tablet 1    famotidine (PEPCID) 40 MG tablet Take 1 tablet (40 mg total) by mouth 2 (two) times daily. 60 tablet 2    furosemide (LASIX) 20 MG  tablet Take 0.5 tablets (10 mg total) by mouth every other day. 30 tablet 0    methocarbamol (ROBAXIN) 500 MG tablet Take 500 mg by mouth daily.      mometasone (ELOCON) 0.1 % ointment 1 application to inflamed skin 1-2 times daily. 45 g 2    montelukast (SINGULAIR) 10 MG tablet Take 1 tablet (10 mg total) by mouth at bedtime. 90 tablet 1    quinapril (ACCUPRIL) 40 MG tablet Take 40 mg by mouth at bedtime.       Assessment: 81 y.o. female admitted with Afib and STEMI s/p cath and awaiting CABG for heparin.  Heparin to start 2 hours after TR band removal Goal of Therapy:  Heparin level 0.3-0.7 units/ml Monitor platelets by anticoagulation protocol: Yes   Plan:  At 0600 star heparin 1200 units/hr Check heparin level  in 8 hours.   Caryl Pina 01/09/2022,4:46 AM

## 2022-01-09 NOTE — Plan of Care (Signed)
  Problem: Activity: Goal: Ability to tolerate increased activity will improve Outcome: Progressing   Problem: Cardiac: Goal: Ability to achieve and maintain adequate cardiopulmonary perfusion will improve Outcome: Progressing Goal: Vascular access site(s) Level 0-1 will be maintained Outcome: Progressing

## 2022-01-09 NOTE — Progress Notes (Addendum)
Progress Note  Patient Name: Brittany Jackson Date of Encounter: 01/09/2022  [facility] Cardiologist:  Ellyn Hack  Subjective   Breathing better.  No chest discomfort.  Inpatient Medications    Scheduled Meds:  aspirin  81 mg Oral Daily   atorvastatin  40 mg Oral Daily   famotidine  20 mg Oral BID   furosemide  40 mg Intravenous BID   metoprolol tartrate  12.5 mg Oral BID   montelukast  10 mg Oral QHS   sodium chloride flush  3 mL Intravenous Q12H   Continuous Infusions:  sodium chloride Stopped (01/09/22 0046)   sodium chloride     albuterol Stopped (01/09/22 0012)   amiodarone 30 mg/hr (01/09/22 0846)   heparin 1,200 Units/hr (01/09/22 0800)   nitroGLYCERIN Stopped (01/09/22 0728)   PRN Meds: sodium chloride, acetaminophen, labetalol, ondansetron (ZOFRAN) IV, sodium chloride flush, zolpidem   Vital Signs    Vitals:   01/09/22 0700 01/09/22 0741 01/09/22 0800 01/09/22 0836  BP: 110/73 110/73 106/66   Pulse: 69 74 71   Resp: (!) 28 (!) 21 (!) 28   Temp:    98.3 F (36.8 C)  TempSrc:    Oral  SpO2: 99% 99% 92%   Weight:      Height:        Intake/Output Summary (Last 24 hours) at 01/09/2022 0910 Last data filed at 01/09/2022 0800 Gross per 24 hour  Intake 204.23 ml  Output 300 ml  Net -95.77 ml      01/08/2022   11:44 PM 06/16/2020    2:37 PM 04/04/2020    9:35 AM  Last 3 Weights  Weight (lbs) 248 lb 3.8 oz 248 lb 3.2 oz 251 lb 12.8 oz  Weight (kg) 112.6 kg 112.583 kg 114.216 kg      Telemetry    Normal sinus rhythm- Personally Reviewed  ECG    Normal sinus rhythm w/ inferolateral ST elevation-Personally Reviewed  Physical Exam   GEN: No acute distress.   Neck: No JVD Cardiac: RRR, no murmurs, rubs, or gallops.  Respiratory: Clear to auscultation bilaterally. GI: Soft, nontender, non-distended  MS: No edema; No deformity. Neuro:  Nonfocal  Psych: Normal affect   Labs    High Sensitivity Troponin:   Recent Labs  Lab 01/08/22 2356  01/09/22 0322 01/09/22 0534  TROPONINIHS 1,967* 2,674* 2,965*     Chemistry Recent Labs  Lab 01/08/22 2356 01/09/22 0133 01/09/22 0158 01/09/22 0201 01/09/22 0314  NA 136 135 136 134* 133*  K 5.6* 5.5* 5.0 5.3* 5.8*  CL 106 105  --   --  106  CO2 23  --   --   --  18*  GLUCOSE 239* 216*  --   --  175*  BUN 13 13  --   --  14  CREATININE 1.11* 1.10*  --   --  1.33*  CALCIUM 9.4  --   --   --  9.2  PROT 7.8  --   --   --   --   ALBUMIN 4.3  --   --   --   --   AST 32  --   --   --   --   ALT 23  --   --   --   --   ALKPHOS 111  --   --   --   --   BILITOT 0.8  --   --   --   --   [facility]  49*  --   --   --  40*  ANIONGAP 7  --   --   --  9    Lipids  Recent Labs  Lab 01/09/22 0017  CHOL 268*  TRIG 148  HDL 74  LDLCALC 164*  CHOLHDL 3.6    Hematology Recent Labs  Lab 01/08/22 2356 01/09/22 0133 01/09/22 0158 01/09/22 0201 01/09/22 0314  WBC 22.8*  --   --   --  21.5*  RBC 4.84  --   --   --  4.49  HGB 14.9   < > 13.6 14.3 13.7  HCT 45.8   < > 40.0 42.0 41.4  MCV 94.6  --   --   --  92.2  MCH 30.8  --   --   --  30.5  MCHC 32.5  --   --   --  33.1  RDW 14.1  --   --   --  13.9  PLT 390  --   --   --  328   < > = values in this interval not displayed.   Thyroid  Recent Labs  Lab 01/09/22 0322  TSH 1.906    BNP Recent Labs  Lab 01/08/22 2356  BNP 227.4*    DDimer No results for input(s): DDIMER in the last 168 hours.   Radiology    CARDIAC CATHETERIZATION  Result Date: 01/09/2022   Mid LM to Dist LM lesion is 60% stenosed.   Dist LM to Ost LAD lesion is 90% stenosed.   Mid LAD lesion is 60% stenosed with 75% stenosed side branch in 1st Diag.   Prox Cx lesion is 70% stenosed with 75% stenosed side branch in LPAV.   Mid RCA-2 lesion is 80% stenosed.   Hemodynamic findings consistent with SEVERE PULMONARY HYPERTENSION.   There is no aortic valve stenosis.   There is moderate (3+) mitral regurgitation. Acute anterolateral STEMI with multivessel CAD:  No obvious culprit lesion to fix other than the left main.  With her tenuous CHF condition, not safe to perform without support. 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 Moderate MR Severe Pulmonary Venous Hypertension: PAP-mean 55/35-46 mmHg, RVP-RVEDP 54/29-17 million mercury, RAP 18 mmHg; PCWP 37 mmHg with LVP-EDP 126/48-36 mmHg; AoP-MAP 113/66-80 mmHg. Telemetry evidence of sinus rhythm with intermittent A-fib-RVR Not in cardiogenic shock => based on her body habitus, and needing to be on a wedge pillow, it shows to avoid instrumenting her with mechanical support. Intermittent A-fib RECOMMENDATIONS Admit to CVICU, restart IV heparin 2 hours after TR band removal, IV diuresis, and IV NTG.> IV amiodarone to maintain sinus rhythm Hold off on antiplatelet agent pending CVTS consultation-would need to "tune up "diuresis/management of CHF prior to either CABG or high risk PCI. Would need heart team approach to determine best course of action.  With distal left main-ostial LAD involved and reduced EF, need to consider the possibility of mechanical support. PCCM consult to assist with BiPAP Glenetta Hew, MD  DG Chest [facility] 1 View  Result Date: 01/09/2022 CLINICAL DATA:  Shortness of breath. EXAM: PORTABLE CHEST 1 VIEW COMPARISON:  08/24/2013. FINDINGS: The heart is enlarged the mediastinal contour is within normal limits. Atherosclerotic calcification of the aorta is noted. The pulmonary vasculature is distended. There is diffuse interstitial  prominence with patchy airspace disease predominantly in the mid to lower lung fields. No effusion or pneumothorax. No acute osseous abnormality. IMPRESSION: 1. Cardiomegaly with pulmonary vascular congestion. 2. Interstitial prominence bilaterally with patchy airspace  disease at the lung bases, possible edema or infiltrate. Electronically Signed   By: Brett Fairy M.D.   On: 01/09/2022 00:22    Cardiac Studies   Cath films reviewed- discussed with Dr. Ellyn Hack and other interventional colleagues.   Patient Profile     81 y.o. female with severe distal left main disease extending into LAD.  Short left main.  Widely patent RCA.  Low EF  Assessment & Plan    CAD/inferolateral: Severe CAD.  Will get Cardiac surgery consult.  Elevated LVEDP.  Low EF.  She needs diuresis and to be tuned up prior to any revascularization.  Follow renal function closely.   If she is not a surgical candidate, there are percutaneous options for treatment.   Received a call later that the patient was having some additional shortness of breath.  Additional 40 mg of IV Lasix was given.  Albuterol nebulizer also ordered.  She has a long smoking history.  Nicotine patch for smoking cessation.  Work on aggressive blood pressure control and diuresis.  If she does not handle the medical therapy from a blood pressure standpoint, may need to consider intra-aortic balloon pump.  Hyperlipidemia:  LDL 164. high dose statin.   CRITICAL CARE Performed by: Larae Grooms   Total critical care time: 35 minutes  Critical care time was exclusive of separately billable procedures and treating other patients.  Critical care was necessary to treat or prevent imminent or life-threatening deterioration.  Critical care was time spent personally by me on the following activities: development of treatment plan with patient and/or surrogate as well as nursing, discussions with consultants, evaluation of patient's response to treatment, examination of patient, obtaining history from patient or surrogate, ordering and performing treatments and interventions, ordering and review of laboratory studies, ordering and review of radiographic studies, pulse oximetry and re-evaluation of patient's condition.    For questions or updates, please contact [place] Please consult [email or web address] for contact info under        Signed, Larae Grooms, MD  01/09/2022, 9:10 AM    Addendum: Patient was started on BiPap.  Urine output has not been as brisk as expected.  She is quite anxious in general.  She has been taking Valium 10 mg TID prn for many years.  We have tried to avoid a support device like balloon pump as her BP was stable.  However, I discussed with Dr. Ellyn Hack that this may be needed if she were to become hypotensive.  Tolerating IV NTG currently.  CHF consult obtained as well. ? Whether Lasix drip or other diuretics may be indicated.   Jettie Booze, MD

## 2022-01-09 NOTE — Interval H&P Note (Signed)
History and Physical Interval Note:  01/09/2022 2:19 AM  Brittany Jackson  has presented today for surgery, with the diagnosis of stemi.  The various methods of treatment have been discussed with the patient and family. After consideration of risks, benefits and other options for treatment, the patient has consented to  Procedure(s): Coronary/Graft Acute MI Revascularization (N/A) RIGHT HEART CATH (N/A) -LEFT HEART CATHETERIZATION WITH CORONARY ANGIOGRAPHY as a surgical intervention.  The patient's history has been reviewed, patient examined, no change in status, stable for surgery.  I have reviewed the patient's chart and labs.  Questions were answered to the patient's satisfaction.       Glenetta Hew

## 2022-01-09 NOTE — Progress Notes (Signed)
ANTICOAGULATION CONSULT NOTE - Follow Up Consult  Pharmacy Consult for heparin Indication: chest pain/ACS and atrial fibrillation  Allergies  Allergen Reactions   Codeine Other (See Comments)    Unknown   Meprobamate Swelling   Sulfa Antibiotics Other (See Comments)    Unknown    Patient Measurements: Height: '5\' 6"'$  (167.6 cm) Weight: 112.6 kg (248 lb 3.8 oz) IBW/kg (Calculated) : 59.3 Heparin Dosing Weight: 85.7 kg  Vital Signs: Temp: 98.4 F (36.9 C) (05/24 2026) Temp Source: Oral (05/24 2026) BP: 126/66 (05/24 1845) Pulse Rate: 85 (05/24 1845)  Labs: Recent Labs    01/08/22 2356 01/09/22 0017 01/09/22 0133 01/09/22 0158 01/09/22 0201 01/09/22 0314 01/09/22 0322 01/09/22 0534 01/09/22 1420 01/09/22 1618 01/09/22 2101  HGB 14.9  --  15.0 13.6 14.3 13.7  --   --   --   --   --   HCT 45.8  --  44.0 40.0 42.0 41.4  --   --   --   --   --   PLT 390  --   --   --   --  328  --   --   --   --   --   APTT  --  29  --   --   --   --   --   --   --   --   --   LABPROT  --  12.1  --   --   --   --   --   --   --   --   --   INR  --  0.9  --   --   --   --   --   --   --   --   --   HEPARINUNFRC  --   --   --   --   --   --   --   --  0.44  --  0.40  CREATININE 1.11*  --  1.10*  --   --  1.33*  --   --   --  1.65*  --   TROPONINIHS 1,967*  --   --   --   --   --  2,674* 2,965*  --   --   --      Estimated Creatinine Clearance: 32.9 mL/min (A) (by C-G formula based on SCr of 1.65 mg/dL (H)).  Assessment: 81 y.o. female admitted with Afib and STEMI s/p cath and awaiting CABG versus high-risk PCI. Patient is not on any anticoagulants PTA. Pharmacy has been consulted for heparin.  First heparin level is therapeutic at 0.40. CBC is stable and within normal limits. No issues with bleeding or infusion noted.   Goal of Therapy:  Heparin level 0.3-0.7 units/ml Monitor platelets by anticoagulation protocol: Yes   Plan:  Continue heparin 1200 units/hr Daily heparin level  and CBC Monitor for s/sx of bleeding  Thank you for allowing pharmacy to be a part of this patient's care.  Donnald Garre, PharmD Clinical Pharmacist  Please check AMION for all [place] numbers After 10:00 PM, call [place] [phone]

## 2022-01-09 NOTE — Consult Note (Addendum)
Advanced Heart Failure Team Consult Note   Primary Physician: Redmond School, MD PCP-Cardiologist:  None  Reason for Consultation: Acute systolic CHF  HPI:    Brittany Jackson is seen today for evaluation of acute systolic CHF at the request of Dr. Irish Lack with Cardiology. 81 y.o. female with history of chronic diastolic CHF, HTN, tobacco use, COPD.   Presented to [facility] ED yesterday evening via EMS with complaints of CP that started while spreading mulch. Pain began about 3 hrs prior to arrival. In respiratory distress on arrival and initiated BiPAP. She was significantly hypertensive with BP 191/110 mmHg, nitro gtt initiated. Appeared to be in Afib with RVR with rates in 150s on telemetry. Rate improved with IV metoprolol.  Once HR improved, ECG consistent with inferolateral STEMI. She was transferred to [facility] for emergent coronary angiography.  R/LHC: multivessel CAD with 90% d LM to ostial LAD, moderate 3+ MR, EF 20-25%, anterior/anterolateral akinesis, RA mean 17, PA mean 46, PCWP mean 37, LVEDP 32, Fick CO/CI 3.89/1.77.   Admitted to ICU. Started on amiodarone gtt for intermittent Afib while in lab. TCTS consulted for consideration of CABG. If not surgical candidate, considering high risk PCI distal LM/ostial LAD with mechanical support.  Reports some dyspnea but has weaned off BiPAP, now on O2 [place].  No angina. Has received 120 mg lasix IV since midnight with 350 cc UOP. Labs: Scr 1.33, CO2 18, K 5.8, Na 133, WBC 22, HS troponin 1,967>2,674>2,965  Echo EF 20-25%, regional WMA, RV mildly reduced, RVSP 41 mmHg, trivial MR, dilated IVC with estimated RAP 15 mmHg  Review of Systems: [y] = yes, '[ ]'$  = no   General: Weight gain '[ ]'$ ; Weight loss '[ ]'$ ; Anorexia '[ ]'$ ; Fatigue '[ ]'$ ; Fever '[ ]'$ ; Chills '[ ]'$ ; Weakness '[ ]'$   Cardiac: Chest pain/pressure [Y]; Resting SOB [Y]; Exertional SOB '[ ]'$ ; Orthopnea '[ ]'$ ; Pedal Edema '[ ]'$ ; Palpitations '[ ]'$ ; Syncope '[ ]'$ ; Presyncope '[ ]'$ ; Paroxysmal nocturnal dyspnea'[ ]'$    Pulmonary: Cough '[ ]'$ ; Wheezing'[ ]'$ ; Hemoptysis'[ ]'$ ; Sputum '[ ]'$ ; Snoring '[ ]'$   GI: Vomiting'[ ]'$ ; Dysphagia'[ ]'$ ; Melena'[ ]'$ ; Hematochezia '[ ]'$ ; Heartburn'[ ]'$ ; Abdominal pain '[ ]'$ ; Constipation '[ ]'$ ; Diarrhea '[ ]'$ ; BRBPR '[ ]'$   GU: Hematuria'[ ]'$ ; Dysuria '[ ]'$ ; Nocturia'[ ]'$   Vascular: Pain in legs with walking '[ ]'$ ; Pain in feet with lying flat '[ ]'$ ; Non-healing sores '[ ]'$ ; Stroke '[ ]'$ ; TIA '[ ]'$ ; Slurred speech '[ ]'$ ;  Neuro: Headaches'[ ]'$ ; Vertigo'[ ]'$ ; Seizures'[ ]'$ ; Paresthesias'[ ]'$ ;Blurred vision '[ ]'$ ; Diplopia '[ ]'$ ; Vision changes '[ ]'$   Ortho/Skin: Arthritis '[ ]'$ ; Joint pain '[ ]'$ ; Muscle pain '[ ]'$ ; Joint swelling '[ ]'$ ; Back Pain '[ ]'$ ; Rash '[ ]'$   Psych: Depression'[ ]'$ ; Anxiety'[ ]'$   Heme: Bleeding problems '[ ]'$ ; Clotting disorders '[ ]'$ ; Anemia '[ ]'$   Endocrine: Diabetes '[ ]'$ ; Thyroid dysfunction'[ ]'$   Home Medications Prior to Admission medications   Medication Sig Start Date End Date Taking? Authorizing Provider  albuterol (PROVENTIL) 4 MG tablet Take 2 mg by mouth 3 (three) times daily. 02/15/20  Yes [provider]  allopurinol (ZYLOPRIM) 100 MG tablet Take 100 mg by mouth daily. 12/19/21  Yes [provider]  Ascorbic Acid (VITAMIN C) 1000 MG tablet Take 1,000 mg by mouth daily.   Yes [provider]  aspirin EC 81 MG tablet Take 81 mg by mouth daily.   Yes [provider]  b complex vitamins capsule Take 1 capsule  by mouth daily.   Yes [provider]  cetirizine (ZYRTEC) 10 MG tablet Take 1-2 tablets 2 times per day maximum. Patient taking differently: Take 10 mg by mouth daily as needed for allergies. 04/04/20  Yes Kozlow, Donnamarie Poag, MD  diazepam (VALIUM) 10 MG tablet Take 10 mg by mouth 3 (three) times daily as needed for anxiety. 06/09/20  Yes [provider]  famotidine (PEPCID) 40 MG tablet Take 1 tablet (40 mg total) by mouth 2 (two) times daily. Patient taking differently: Take 40 mg by mouth 2 (two) times daily as needed for heartburn or indigestion. 07/18/20  Yes Kozlow, Donnamarie Poag,  MD  methocarbamol (ROBAXIN) 500 MG tablet Take 500 mg by mouth every 8 (eight) hours as needed for muscle spasms.   Yes [provider]  Multiple Vitamins-Minerals (MULTI FOR HER 50+) TABS Take 1 tablet by mouth daily.   Yes [provider]  ramipril (ALTACE) 10 MG capsule Take 10 mg by mouth daily. 11/05/21  Yes [provider]  Red Yeast Rice 600 MG CAPS Take 600 mg by mouth daily.   Yes [provider]  zinc gluconate 50 MG tablet Take 50 mg by mouth daily.   Yes [provider]  famotidine (PEPCID) 20 MG tablet Take 1 tablet (20 mg total) by mouth 2 (two) times daily. Patient not taking: Reported on 01/09/2022 07/20/20   Jiles Prows, MD  montelukast (SINGULAIR) 10 MG tablet Take 1 tablet (10 mg total) by mouth at bedtime. Patient not taking: Reported on 01/09/2022 07/20/20   Jiles Prows, MD    Past Medical History: Past Medical History:  Diagnosis Date   CHF (congestive heart failure) ([place])    Hypertension    Urticaria     Past Surgical History: Past Surgical History:  Procedure Laterality Date   APPENDECTOMY     BACK SURGERY     CORONARY/GRAFT ACUTE MI REVASCULARIZATION N/A 01/08/2022   Procedure: Coronary/Graft Acute MI Revascularization;  Surgeon: Leonie Man, MD;  Location: [place] [facility];  Service: Cardiovascular;  Laterality: N/A;   HUMERUS IM NAIL Left 08/24/2013   Procedure: INTRAMEDULLARY (IM) NAIL HUMERAL;  Surgeon: Nita Sells, MD;  Location: [place];  Service: Orthopedics;  Laterality: Left;   RIGHT HEART CATH N/A 01/08/2022   Procedure: RIGHT HEART CATH;  Surgeon: Leonie Man, MD;  Location: [place] [facility];  Service: Cardiovascular;  Laterality: N/A;   TUBAL LIGATION      Family History: Family History  Problem Relation Age of Onset   Hypertension Sister    Allergic rhinitis Neg Hx    Asthma Neg Hx    Angioedema Neg Hx    Atopy Neg Hx    Eczema Neg Hx    Immunodeficiency Neg Hx     Urticaria Neg Hx     Social History: Social History   Socioeconomic History   Marital status: Widowed    Spouse name: Not on file   Number of children: Not on file   Years of education: Not on file   Highest education level: Not on file  Occupational History   Not on file  Tobacco Use   Smoking status: Every Day    Packs/day: 0.50    Types: Cigarettes   Smokeless tobacco: Never  Vaping Use   Vaping Use: Never used  Substance and Sexual Activity   Alcohol use: Yes    Comment: occasional   Drug use: No   Sexual activity:  Never    Birth control/protection: Surgical  Other Topics Concern   Not on file  Social History Narrative   Not on file   Social Determinants of Health   Financial Resource Strain: Not on file  Food Insecurity: Not on file  Transportation Needs: Not on file  Physical Activity: Not on file  Stress: Not on file  Social Connections: Not on file    Allergies:  Allergies  Allergen Reactions   Codeine Other (See Comments)    Unknown   Meprobamate Swelling   Sulfa Antibiotics Other (See Comments)    Unknown    Objective:    Vital Signs:   Temp:  [97.6 F (36.4 C)-98.3 F (36.8 C)] 98.3 F (36.8 C) (05/24 0836) Pulse Rate:  [0-167] 70 (05/24 1415) Resp:  [19-39] 22 (05/24 1415) BP: (97-191)/(53-114) 110/63 (05/24 1415) SpO2:  [90 %-100 %] 98 % (05/24 1415) FiO2 (%):  [60 %-100 %] 60 % (05/24 1037) Weight:  [112.6 kg] 112.6 kg (05/23 2344)    Weight change: Filed Weights   01/08/22 2344  Weight: 112.6 kg    Intake/Output:   Intake/Output Summary (Last 24 hours) at 01/09/2022 1530 Last data filed at 01/09/2022 1400 Gross per 24 hour  Intake 411.99 ml  Output 650 ml  Net -238.01 ml      Physical Exam    General:  No distress. Sitting up in bed. HEENT: normal Neck: supple. JVP to ear. Carotids 2+ bilat; no bruits.  Cor: PMI nondisplaced. Regular rate & rhythm. No rubs, gallops or murmurs. Lungs: bibasilar crackles Abdomen:  soft, nontender, nondistended.  Extremities: no cyanosis, clubbing, rash, 1+ edema Neuro: alert & orientedx3, cranial nerves grossly intact. moves all 4 extremities w/o difficulty. Affect pleasant   Telemetry   SR 80s   Labs   Basic Metabolic Panel: Recent Labs  Lab 01/08/22 2356 01/09/22 0133 01/09/22 0158 01/09/22 0201 01/09/22 0314  NA 136 135 136 134* 133*  K 5.6* 5.5* 5.0 5.3* 5.8*  CL 106 105  --   --  106  CO2 23  --   --   --  18*  GLUCOSE 239* 216*  --   --  175*  BUN 13 13  --   --  14  CREATININE 1.11* 1.10*  --   --  1.33*  CALCIUM 9.4  --   --   --  9.2    Liver Function Tests: Recent Labs  Lab 01/08/22 2356  AST 32  ALT 23  ALKPHOS 111  BILITOT 0.8  PROT 7.8  ALBUMIN 4.3   No results for input(s): LIPASE, AMYLASE in the last 168 hours. No results for input(s): AMMONIA in the last 168 hours.  CBC: Recent Labs  Lab 01/08/22 2356 01/09/22 0133 01/09/22 0158 01/09/22 0201 01/09/22 0314  WBC 22.8*  --   --   --  21.5*  NEUTROABS 15.8*  --   --   --   --   HGB 14.9 15.0 13.6 14.3 13.7  HCT 45.8 44.0 40.0 42.0 41.4  MCV 94.6  --   --   --  92.2  PLT 390  --   --   --  328    Cardiac Enzymes: No results for input(s): CKTOTAL, CKMB, CKMBINDEX, TROPONINI in the last 168 hours.  BNP: BNP (last 3 results) Recent Labs    01/08/22 2356  BNP 227.4*    ProBNP (last 3 results) No results for input(s): PROBNP in the last 8760 hours.  CBG: No results for input(s): GLUCAP in the last 168 hours.  Coagulation Studies: Recent Labs    01/09/22 0017  LABPROT 12.1  INR 0.9     Imaging   CARDIAC CATHETERIZATION  Result Date: 01/09/2022   Mid LM to Dist LM lesion is 60% stenosed.   Dist LM to Ost LAD lesion is 90% stenosed.   Mid LAD lesion is 60% stenosed with 75% stenosed side branch in 1st Diag.   Prox Cx lesion is 70% stenosed with 75% stenosed side branch in LPAV.   Mid RCA-2 lesion is 80% stenosed.   Hemodynamic findings consistent  with SEVERE PULMONARY HYPERTENSION.   There is no aortic valve stenosis.   There is moderate (3+) mitral regurgitation. Acute anterolateral STEMI with multivessel CAD: No obvious culprit lesion to fix other than the left main.  With her tenuous CHF condition, not safe to perform without support. 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 Moderate MR Severe Pulmonary Venous Hypertension: PAP-mean 55/35-46 mmHg, RVP-RVEDP 54/29-17 million mercury, RAP 18 mmHg; PCWP 37 mmHg with LVP-EDP 126/48-36 mmHg; AoP-MAP 113/66-80 mmHg. Telemetry evidence of sinus rhythm with intermittent A-fib-RVR Not in cardiogenic shock => based on her body habitus, and needing to be on a wedge pillow, it shows to avoid instrumenting her with mechanical support. Intermittent A-fib RECOMMENDATIONS Admit to CVICU, restart IV heparin 2 hours after TR band removal, IV diuresis, and IV NTG.> IV amiodarone to maintain sinus rhythm Hold off on antiplatelet agent pending CVTS consultation-would need to "tune up "diuresis/management of CHF prior to either CABG or high risk PCI. Would need heart team approach to determine best course of action.  With distal left main-ostial LAD involved and reduced EF, need to consider the possibility of mechanical support. PCCM consult to assist with BiPAP Glenetta Hew, MD  DG Chest [facility] 1 View  Result Date: 01/09/2022 CLINICAL DATA:  Shortness of breath. EXAM: PORTABLE CHEST 1 VIEW COMPARISON:  08/24/2013. FINDINGS: The heart is enlarged the mediastinal contour is within normal limits. Atherosclerotic calcification of the aorta is noted. The pulmonary vasculature is distended. There is diffuse interstitial prominence with patchy airspace disease predominantly in the mid to lower lung fields. No effusion or  pneumothorax. No acute osseous abnormality. IMPRESSION: 1. Cardiomegaly with pulmonary vascular congestion. 2. Interstitial prominence bilaterally with patchy airspace disease at the lung bases, possible edema or infiltrate. Electronically Signed   By: Brett Fairy M.D.   On: 01/09/2022 00:22   ECHOCARDIOGRAM COMPLETE  Result Date: 01/09/2022    ECHOCARDIOGRAM REPORT   Patient Name:   Brittany Jackson Date of Exam: 01/09/2022 Medical Rec #:  [id number]     Height:       66.0 in Accession #:    [id number]    Weight:       248.2 lb Date of Birth:  20-Aug-1940     BSA:          2.192 m Patient Age:    35 years      BP:           128/75 mmHg Patient Gender: F             HR:           68 bpm. Exam Location:  Inpatient Procedure: 2D Echo,  Cardiac Doppler and Color Doppler Indications:    Myocardial infarction  History:        Patient has prior history of Echocardiogram examinations, most                 recent 04/09/2012. CHF; Risk Factors:Hypertension.  Sonographer:    Joette Catching RCS Referring Phys: [place]  1. Left ventricular ejection fraction, by estimation, is 20 to 25%. The left ventricle has severely decreased function. The left ventricle demonstrates regional wall motion abnormalities with mid to apical anterior, anteroseptal, inferoseptal, and inferior akinesis, akinesis of the apical lateral wall, and akinesis of the true apex. No LV thrombus visualized. There is mild left ventricular hypertrophy. Left ventricular diastolic parameters are consistent with Grade II diastolic dysfunction (pseudonormalization).  2. Right ventricular systolic function is mildly reduced. The right ventricular size is normal. There is mildly elevated pulmonary artery systolic pressure. The estimated right ventricular systolic pressure is 38.1 mmHg.  3. The mitral valve is degenerative. Trivial mitral valve regurgitation. No evidence of mitral stenosis. Moderate mitral annular calcification.  4. The aortic  valve is tricuspid. There is mild calcification of the aortic valve. Aortic valve regurgitation is not visualized. No aortic stenosis is present.  5. The inferior vena cava is dilated in size with <50% respiratory variability, suggesting right atrial pressure of 15 mmHg. FINDINGS  Left Ventricle: Left ventricular ejection fraction, by estimation, is 20 to 25%. The left ventricle has severely decreased function. The left ventricle demonstrates regional wall motion abnormalities. The left ventricular internal cavity size was normal  in size. There is mild left ventricular hypertrophy. Left ventricular diastolic parameters are consistent with Grade II diastolic dysfunction (pseudonormalization). Right Ventricle: The right ventricular size is normal. No increase in right ventricular wall thickness. Right ventricular systolic function is mildly reduced. There is mildly elevated pulmonary artery systolic pressure. The tricuspid regurgitant velocity  is 2.54 m/s, and with an assumed right atrial pressure of 15 mmHg, the estimated right ventricular systolic pressure is 01.7 mmHg. Left Atrium: Left atrial size was normal in size. Right Atrium: Right atrial size was normal in size. Pericardium: There is no evidence of pericardial effusion. Mitral Valve: The mitral valve is degenerative in appearance. There is moderate calcification of the mitral valve leaflet(s). Moderate mitral annular calcification. Trivial mitral valve regurgitation. No evidence of mitral valve stenosis. Tricuspid Valve: The tricuspid valve is normal in structure. Tricuspid valve regurgitation is trivial. Aortic Valve: The aortic valve is tricuspid. There is mild calcification of the aortic valve. Aortic valve regurgitation is not visualized. No aortic stenosis is present. Aortic valve mean gradient measures 3.0 mmHg. Aortic valve peak gradient measures 4.8 mmHg. Aortic valve area, by VTI measures 2.16 cm. Pulmonic Valve: The pulmonic valve was normal in  structure. Pulmonic valve regurgitation is trivial. Aorta: The aortic root is normal in size and structure. Venous: The inferior vena cava is dilated in size with less than 50% respiratory variability, suggesting right atrial pressure of 15 mmHg. IAS/Shunts: No atrial level shunt detected by color flow Doppler.  LEFT VENTRICLE PLAX 2D LVIDd:         5.00 cm     Diastology LVIDs:         3.60 cm     LV e' medial:    3.30 cm/s LV PW:         0.80 cm     LV E/e' medial:  30.3 LV IVS:  0.90 cm     LV e' lateral:   5.48 cm/s LVOT diam:     2.00 cm     LV E/e' lateral: 18.2 LV SV:         49 LV SV Index:   22 LVOT Area:     3.14 cm  LV Volumes (MOD) LV vol d, MOD A2C: 83.6 ml LV vol d, MOD A4C: 75.4 ml LV vol s, MOD A2C: 56.0 ml LV vol s, MOD A4C: 65.2 ml LV SV MOD A2C:     27.6 ml LV SV MOD A4C:     75.4 ml LV SV MOD BP:      18.2 ml RIGHT VENTRICLE            IVC RV Basal diam:  3.80 cm    IVC diam: 1.90 cm RV Mid diam:    3.10 cm RV S prime:     9.45 cm/s TAPSE (M-mode): 2.0 cm LEFT ATRIUM             Index        RIGHT ATRIUM           Index LA diam:        4.50 cm 2.05 cm/m   RA Area:     11.50 cm LA Vol (A2C):   55.1 ml 25.14 ml/m  RA Volume:   25.00 ml  11.40 ml/m LA Vol (A4C):   58.6 ml 26.73 ml/m LA Biplane Vol: 57.8 ml 26.37 ml/m  AORTIC VALVE                    PULMONIC VALVE AV Area (Vmax):    2.11 cm     PV Vmax:          0.81 m/s AV Area (Vmean):   2.04 cm     PV Peak grad:     2.7 mmHg AV Area (VTI):     2.16 cm     PR End Diast Vel: 10.63 msec AV Vmax:           110.00 cm/s AV Vmean:          82.300 cm/s AV VTI:            0.225 m AV Peak Grad:      4.8 mmHg AV Mean Grad:      3.0 mmHg LVOT Vmax:         73.80 cm/s LVOT Vmean:        53.500 cm/s LVOT VTI:          0.155 m LVOT/AV VTI ratio: 0.69  AORTA Ao Root diam: 2.70 cm Ao Asc diam:  2.70 cm MITRAL VALVE                TRICUSPID VALVE MV Area (PHT): 3.10 cm     TR Peak grad:   25.8 mmHg MV Decel Time: 245 msec     TR Vmax:         254.00 cm/s MR Peak grad: 76.7 mmHg MR Vmax:      438.00 cm/s   SHUNTS MV E velocity: 100.00 cm/s  Systemic VTI:  0.16 m MV A velocity: 102.00 cm/s  Systemic Diam: 2.00 cm MV E/A ratio:  0.98 Emory Gallentine McleanMD Electronically signed by Franki Monte Signature Date/Time: 01/09/2022/3:08:46 PM    Final    VAS US DOPPLER PRE CABG  Result Date: 01/09/2022 PREOPERATIVE VASCULAR EVALUATION Patient Name:  [place]  Date of Exam:  01/09/2022 Medical Rec #: [id number]      Accession #:    [id number] Date of Birth: June 13, 1941      Patient Gender: F Patient Age:   41 years Exam Location:  [facility] Procedure:      VAS US DOPPLER PRE CABG Referring Phys: Collier Salina VANTRIGT --------------------------------------------------------------------------------  Indications:      Pre-CABG. Risk Factors:     Hypertension. Comparison Study: No prior study Performing Technologist: Maudry Mayhew MHA, RDMS, RVT, RDCS  Examination Guidelines: A complete evaluation includes B-mode imaging, spectral Doppler, color Doppler, and power Doppler as needed of all accessible portions of each vessel. Bilateral testing is considered an integral part of a complete examination. Limited examinations for reoccurring indications may be performed as noted.  Right Carotid Findings: +----------+--------+-------+--------+----------------------+------------------+           PSV cm/sEDV    StenosisDescribe              Comments                             cm/s                                                    +----------+--------+-------+--------+----------------------+------------------+ CCA Prox  44      10                                                      +----------+--------+-------+--------+----------------------+------------------+ CCA Distal35      9                                    intimal thickening +----------+--------+-------+--------+----------------------+------------------+ ICA Prox  42      16              smooth and                                                                heterogenous                             +----------+--------+-------+--------+----------------------+------------------+ ICA Distal74      18                                                      +----------+--------+-------+--------+----------------------+------------------+ ECA       21      6              smooth and  heterogenous                             +----------+--------+-------+--------+----------------------+------------------+ +----------+--------+-------+----------------+------------+           PSV cm/sEDV cmsDescribe        Arm Pressure +----------+--------+-------+----------------+------------+ [id number]             Multiphasic, WNL             +----------+--------+-------+----------------+------------+ +---------+--------+--+--------+--+---------+ VertebralPSV cm/s39EDV cm/s11Antegrade +---------+--------+--+--------+--+---------+ Left Carotid Findings: +----------+--------+--------+--------+-------------------------+---------+           PSV cm/sEDV cm/sStenosisDescribe                 Comments  +----------+--------+--------+--------+-------------------------+---------+ CCA Prox  48      11                                                 +----------+--------+--------+--------+-------------------------+---------+ CCA Distal35      9               smooth and hyperechoic             +----------+--------+--------+--------+-------------------------+---------+ ICA Prox  27      10              heterogenous and calcificShadowing +----------+--------+--------+--------+-------------------------+---------+ ICA Distal45      12                                                 +----------+--------+--------+--------+-------------------------+---------+ ECA       47      6                heterogenous                       +----------+--------+--------+--------+-------------------------+---------+  +----------+--------+--------+----------------+------------+ SubclavianPSV cm/sEDV cm/sDescribe        Arm Pressure +----------+--------+--------+----------------+------------+           48              Multiphasic, WNL             +----------+--------+--------+----------------+------------+ +---------+--------+--+--------+--+---------+ VertebralPSV cm/s56EDV cm/s18Antegrade +---------+--------+--+--------+--+---------+  ABI Findings: +--------+------------------+-----+---------+--------+ Right   Rt Pressure (mmHg)IndexWaveform Comment  +--------+------------------+-----+---------+--------+ Brachial                       triphasic         +--------+------------------+-----+---------+--------+ PTA                            triphasic         +--------+------------------+-----+---------+--------+ DP                             biphasic          +--------+------------------+-----+---------+--------+ +--------+------------------+-----+---------+-------+ Left    Lt Pressure (mmHg)IndexWaveform Comment +--------+------------------+-----+---------+-------+ Brachial                       triphasic        +--------+------------------+-----+---------+-------+ PTA  triphasic        +--------+------------------+-----+---------+-------+ DP                             triphasic        +--------+------------------+-----+---------+-------+  Right Doppler Findings: +-----------+--------+-----+---------+-----------------------------------------+ Site       PressureIndexDoppler  Comments                                  +-----------+--------+-----+---------+-----------------------------------------+ Brachial                triphasic                                           +-----------+--------+-----+---------+-----------------------------------------+ Radial                  triphasic                                          +-----------+--------+-----+---------+-----------------------------------------+ Ulnar                   triphasic                                          +-----------+--------+-----+---------+-----------------------------------------+ Palmar Arch                      Signal is unaffected with radial                                           compression, obliterates with ulnar                                        compression                               +-----------+--------+-----+---------+-----------------------------------------+  Left Doppler Findings: +-----------+--------+-----+---------+-----------------------------------------+ Site       PressureIndexDoppler  Comments                                  +-----------+--------+-----+---------+-----------------------------------------+ Brachial                triphasic                                          +-----------+--------+-----+---------+-----------------------------------------+ Radial                  triphasic                                          +-----------+--------+-----+---------+-----------------------------------------+ Ulnar  triphasic                                          +-----------+--------+-----+---------+-----------------------------------------+ Palmar Arch                      Signal is unaffected with radial                                           compression, decreaes 50% with ulnar                                       compression                               +-----------+--------+-----+---------+-----------------------------------------+  Summary: Right Carotid: Velocities in the right ICA are consistent with a 1-39% stenosis. Left Carotid: Velocities in the left ICA are  consistent with a 1-39% stenosis. Vertebrals:  Bilateral vertebral arteries demonstrate antegrade flow. Subclavians: Normal flow hemodynamics were seen in bilateral subclavian              arteries. ABI: Bilateral pedal waveforms are within normal limits at rest. Right Upper Extremity: Doppler waveforms remain within normal limits with right radial compression. Doppler waveform obliterate with right ulnar compression. Left Upper Extremity: Doppler waveforms remain within normal limits with left radial compression. Doppler waveforms decrease 50% with left ulnar compression.      Preliminary    [place] EKG SITE RITE  Result Date: 01/09/2022 If Site Rite image not attached, placement could not be confirmed due to current cardiac rhythm.    Medications:     Current Medications:  aspirin  81 mg Oral Daily   atorvastatin  40 mg Oral Daily   famotidine  20 mg Oral BID   furosemide  80 mg Intravenous Once   metoprolol tartrate  12.5 mg Oral BID   montelukast  10 mg Oral QHS   nicotine  21 mg Transdermal Daily   sodium chloride flush  3 mL Intravenous Q12H    Infusions:  sodium chloride Stopped (01/09/22 0046)   sodium chloride     albuterol Stopped (01/09/22 0012)   amiodarone 30 mg/hr (01/09/22 1400)   furosemide (LASIX) 200 mg in dextrose 5% 100 mL ('2mg'$ /mL) infusion     heparin 1,200 Units/hr (01/09/22 1400)   milrinone     nitroGLYCERIN 40 mcg/min (01/09/22 1400)      Patient Profile   81 y.o. female with history of chronic diastolic CHF, tobacco use, HTN, COPD. Admitted with inferolateral STEMI c/b acute systolic CHF with low-output.  Assessment/Plan   CAD/inferolateral STEMI: -Presented with CP yesterday evening -HS troponin 1,967>2,674>2,965 -R/LHC: multivessel CAD with 90% distal LM/ostial LAD -TCTS consulted for consideration of CABG. If not surgical candidate, will need high-risk PCI with possible mechanical support -On heparin gtt, aspirin, statin -No beta blocker with  low-output HF -Can stop heparin gtt now that BP improved (now soft low 100s) and no angina  Acute systolic CHF with low-output/ICM -LHC 3v CAD including LM disease as above. RHC RA 17, PA mean 46, PCWP 37, LVEDP 32, Fick CO/CI 3.89/1.77 -Echo EF 20-25%,  RWA, RV mildly reduced -Appears volume overloaded. Minimal UOP today despite 120 mg IV lasix.  -Start milrinone 0.25 -Give 80 mg lasix IV and start last gtt at 12/hr.  -Place PICC line for CVP and CO-OX monitoring. -Eventually GDMT -BMET this evening  AKI -Scr 1.1>1.33, suspect Scr may trend up further post MI -Avoid hypotension -Watch renal function with diuresis  Hyperkalemia -K 5.8 this am. -Recheck BMET  Leukocytosis -WBC 21.5, possibly reactive from acute MI -AF.  -CXR this am with pulmonary vascular congestion and pulmonary edema -Monitor   Acute respiratory failure with hypoxia -Secondary to acute CHF/pulmonary edema -Improving. Off BiPAP, now on O2,     ADDENDUM: Developed pleuritic chest pain after nitro infusion weaned off.   ? Post MI pericarditis or angina (has unrevascularized CAD). Restart nitro gtt at 5-10 mcg/min.  STAT ECG and troponin   Length of Stay: 0  FINCH, LINDSAY N, PA-C  01/09/2022, 3:30 PM  Advanced Heart Failure Team Pager [phone] (M-F; 7a - 5p)  Please contact [facility] for night-coverage after hours (4p -7a ) and weekends on [email or web address]   Patient seen with PA, agree with the above note.   History as described above.  Patient with anterolateral STEMI found to have 3VD.  RHC showed markedly elevated filling pressures with low CI 1.8.  Echo with EF 20-25%.    Patient has been short of breath this afternoon with poor UOP.  Creatinine 1.65 with K 5.7.   General: NAD Neck: JVP 16+ cm, no thyromegaly or thyroid nodule.  Lungs: Decreased BS at bases.  CV: Nondisplaced PMI.  Heart regular S1/S2, no S3/S4, no murmur.  No peripheral edema.   Abdomen: Soft, nontender, no  hepatosplenomegaly, no distention.  Skin: Intact without lesions or rashes.  Neurologic: Alert and oriented x 3.  Psych: Normal affect. Extremities: No clubbing or cyanosis.  HEENT: Normal.   1. CAD: Patient presented with anterolateral STEMI. Peak HS-TnI 2965. LHC with severe 3VD, 60% dLM, 90% ostial LAD, 75% stenosis at bifurcation of mLAD and D, 75% stenosis at bifurcation of mLCx and OM.  Patient would be best served by CABG if stable enough for it.  Has had some pleuritic chest pain this afternoon, relatively mild.  - Can use NTG gtt at low dose for now.  - ASA 81 - heparin gtt - atorvastatin 80 daily - Will need to stabilize then evaluation by TCTS.  If not CABG candidate, will need high risk PCI.  2. Acute systolic CHF: Ischemic cardiomyopathy.  Low output on initial RHC with CI 1.8.  Filling pressures significantly elevated.  She is volume overloaded on exam today.  Echo with EF 20-25%, mildly decreased RV systolic function, trivial MR, dilated IVC.  - Lasix 80 IV bolus then 12 mg/hr gtt.  - milrinone 0.25 mcg/kg/min with low output.  - If she develops further ischemic-type chest pain or does not respond well to milrinone, may need IABP placement.  - Place PICC to follow CVP and co-ox.  3.  Atrial fibrillation: Paroxysmal, noted initially this hospitalization.  She is in NSR now.  - Continue heparin gtt.  - Continue amiodarone gtt 30 mg/hr.  4.  AKI: Creatinine up to 1.65 with K persistently elevated. Suspect cardiorenal with low output.  - Hopefully, this will improve with milrinone.  - Will give a dose of Lokelma 5 g x 1.  Also getting Lasix.  Repeat BMET at 10 pm to make sure we do not lower too much.  Loralie Champagne 01/09/2022 6:09 PM

## 2022-01-09 NOTE — Progress Notes (Signed)
PT placed on bipap for increased WOB, and SOB

## 2022-01-09 NOTE — Progress Notes (Signed)
Pre-CABG Dopplers completed. Refer to "CV Proc" under chart review to view preliminary results.  01/09/2022 10:49 AM Kelby Aline., MHA, RVT, RDCS, RDMS

## 2022-01-09 NOTE — Consult Note (Addendum)
[address]       [place] [address]             [phone]        Brittany Jackson [facility] Medical Record #[id number] Date of Birth: March 17, 1941  Referring: No ref. provider found Primary Care: Elfredia Nevins, MD Primary Cardiologist:None  Chief Complaint:    Chief Complaint  Patient presents with   Shortness of Breath    History of Present Illness:    We are asked to see this 81 year old female in cardiothoracic surgical consultation for consideration of coronary artery surgical revascularization.  The patient presented to the ER at [facility] following calling EMS for symptoms of progressive dyspnea on exertion and shortness of breath.  This was occurring while she was laying mulch in her yard yesterday afternoon.  On presentation she was tachycardiac with tachypnea and significant hypertension with a blood pressure of 191/110.  She was started on nitroglycerin and placed on BiPAP.  EKG showed findings consistent with inferolateral STEMI and she was transferred to [facility] emergently for catheterization and possible intervention.  Initial high-sensitivity troponin I was markedly elevated at 1967 and has not peaked.  She has a acute kidney injury with rising creatinine.  Most recent is 1.33.  Her BNP was noted to be elevated at 227.  Chest x-ray showed findings consistent with congestive heart failure.  Cardiac catheterization done today shows left main and severe three-vessel coronary artery disease and hemodynamic findings consistent with severe pulmonary hypertension.  There was noted to be moderate 3+ mitral regurgitation.  There is findings consistent with combined systolic and diastolic heart failure with an EF of 20 to 25% and anterior/anterolateral akinesis, LVEDP and PCWP of 36-37.  He would require significant tune up including diuresis/management of CHF before proceeding with either cysts surgical or percutaneous intervention.  Has been placed on a heparin drip.   Additional cardiac risk factors include hypertension and everyday tobacco abuse.    Current Activity/ Functional Status: Patient was independent with mobility/ambulation, transfers, ADL's, IADL's.   Zubrod Score: At the time of surgery this patient's most appropriate activity status/level should be described as: []     0    Normal activity, no symptoms []     1    Restricted in physical strenuous activity but ambulatory, able to do out light work []     2    Ambulatory and capable of self care, unable to do work activities, up and about                 more than 50%  Of the time                            [x]     3    Only limited self care, in bed greater than 50% of waking hours Severe left main and    4    Completely disabled, no self care, confined to bed or chair []     5    Moribund  Past Medical History:  Diagnosis Date   CHF (congestive heart failure) (HCC)    Hypertension    Urticaria     Past Surgical History:  Procedure Laterality Date   APPENDECTOMY     BACK SURGERY     CORONARY/GRAFT ACUTE MI REVASCULARIZATION N/A 01/08/2022   Procedure: Coronary/Graft Acute MI Revascularization;  Surgeon: Marykay Lex, MD;  Location: [facility] INVASIVE CV  LAB;  Service: Cardiovascular;  Laterality: N/A;   HUMERUS IM NAIL Left 08/24/2013   Procedure: INTRAMEDULLARY (IM) NAIL HUMERAL;  Surgeon: Mable Paris, MD;  Location: [facility] OR;  Service: Orthopedics;  Laterality: Left;   RIGHT HEART CATH N/A 01/08/2022   Procedure: RIGHT HEART CATH;  Surgeon: Marykay Lex, MD;  Location: [facility] INVASIVE CV LAB;  Service: Cardiovascular;  Laterality: N/A;   TUBAL LIGATION      Social History   Tobacco Use  Smoking Status Every Day   Packs/day: 0.50   Types: Cigarettes  Smokeless Tobacco Never    Social History   Substance and Sexual Activity  Alcohol Use Yes   Comment: occasional     Allergies  Allergen Reactions   Codeine     Unknown   Sulfa Antibiotics     Unknown    Current  Facility-Administered Medications  Medication Dose Route Frequency Provider Last Rate Last Admin   0.9 %  sodium chloride infusion   Intravenous Continuous Marykay Lex, MD   Stopped at 01/09/22 0046   0.9 %  sodium chloride infusion  250 mL Intravenous PRN Marykay Lex, MD       acetaminophen (TYLENOL) tablet 650 mg  650 mg Oral Q4H PRN Marykay Lex, MD   650 mg at 01/09/22 0847   albuterol (PROVENTIL) (2.5 MG/3ML) 0.083% nebulizer solution 2.5 mg  2.5 mg Nebulization Q2H PRN Corky Crafts, MD       albuterol (PROVENTIL) (2.5 MG/3ML) 0.083% nebulizer solution  10 mg/hr Nebulization Continuous Marykay Lex, MD   Held at 01/09/22 0012   amiodarone (NEXTERONE PREMIX) 360-4.14 MG/200ML-% (1.8 mg/mL) IV infusion  30 mg/hr Intravenous Continuous Marykay Lex, MD 16.67 mL/hr at 01/09/22 0846 30 mg/hr at 01/09/22 0846   aspirin chewable tablet 81 mg  81 mg Oral Daily Marykay Lex, MD   81 mg at 01/09/22 0904   atorvastatin (LIPITOR) tablet 40 mg  40 mg Oral Daily Marykay Lex, MD   40 mg at 01/09/22 6213   diazepam (VALIUM) tablet 10 mg  10 mg Oral Q8H PRN Corky Crafts, MD       famotidine (PEPCID) tablet 20 mg  20 mg Oral BID Marykay Lex, MD   20 mg at 01/09/22 0865   furosemide (LASIX) injection 40 mg  40 mg Intravenous BID Marykay Lex, MD   40 mg at 01/09/22 0748   heparin ADULT infusion 100 units/mL (25000 units/273mL)  1,200 Units/hr Intravenous Continuous Marykay Lex, MD 12 mL/hr at 01/09/22 0800 1,200 Units/hr at 01/09/22 0800   metoprolol tartrate (LOPRESSOR) tablet 12.5 mg  12.5 mg Oral BID Marykay Lex, MD   12.5 mg at 01/09/22 0903   montelukast (SINGULAIR) tablet 10 mg  10 mg Oral QHS Marykay Lex, MD       nicotine (NICODERM CQ - dosed in mg/24 hours) patch 21 mg  21 mg Transdermal Daily Corky Crafts, MD       nitroGLYCERIN 50 mg in dextrose 5 % 250 mL (0.2 mg/mL) infusion  0-200 mcg/min Intravenous Continuous Marykay Lex, MD   Paused at 01/09/22 0728   ondansetron (ZOFRAN) injection 4 mg  4 mg Intravenous Q6H PRN Marykay Lex, MD       sodium chloride flush (NS) 0.9 % injection 3 mL  3 mL Intravenous Q12H Marykay Lex, MD       sodium chloride flush (NS) 0.9 %  injection 3 mL  3 mL Intravenous PRN Marykay Lex, MD       zolpidem [facility]) tablet 5 mg  5 mg Oral QHS PRN Marykay Lex, MD        Medications Prior to Admission  Medication Sig Dispense Refill Last Dose   albuterol (PROVENTIL) 4 MG tablet Take by mouth.       aspirin EC 81 MG tablet Take 81 mg by mouth daily.      cetirizine (ZYRTEC) 10 MG tablet Take 1-2 tablets 2 times per day maximum. 120 tablet 1    cetirizine (ZYRTEC) 10 MG tablet Take 2 tablets 2 times daily. 120 tablet 2    diazepam (VALIUM) 10 MG tablet Take 10 mg by mouth 3 (three) times daily as needed.      famotidine (PEPCID) 20 MG tablet Take 1 tablet (20 mg total) by mouth 2 (two) times daily. 180 tablet 1    famotidine (PEPCID) 40 MG tablet Take 1 tablet (40 mg total) by mouth 2 (two) times daily. 60 tablet 2    furosemide (LASIX) 20 MG tablet Take 0.5 tablets (10 mg total) by mouth every other day. 30 tablet 0    methocarbamol (ROBAXIN) 500 MG tablet Take 500 mg by mouth daily.      mometasone (ELOCON) 0.1 % ointment 1 application to inflamed skin 1-2 times daily. 45 g 2    montelukast (SINGULAIR) 10 MG tablet Take 1 tablet (10 mg total) by mouth at bedtime. 90 tablet 1    quinapril (ACCUPRIL) 40 MG tablet Take 40 mg by mouth at bedtime.       Family History  Problem Relation Age of Onset   Hypertension Sister    Allergic rhinitis Neg Hx    Asthma Neg Hx    Angioedema Neg Hx    Atopy Neg Hx    Eczema Neg Hx    Immunodeficiency Neg Hx    Urticaria Neg Hx      Review of Systems:   Review of Systems  Constitutional:  Positive for malaise/fatigue.  HENT: Negative.    Eyes: Negative.   Respiratory:  Positive for cough, sputum production, shortness  of breath and wheezing.   Cardiovascular:  Positive for chest pain, palpitations and leg swelling.  Gastrointestinal:        H/o hemmorhoids  Genitourinary: Negative.   Musculoskeletal:  Positive for myalgias.  Neurological:        Visual migraines  Endo/Heme/Allergies: Negative.     Physical Exam: BP 122/85   Pulse 75   Temp 98.3 F (36.8 C) (Oral)   Resp (!) 33   Ht 5\' 6"  (1.676 m)   Wt 112.6 kg   SpO2 97%   BMI 40.07 kg/m    General appearance: alert, cooperative, and mild distress Head: Normocephalic, without obvious abnormality, atraumatic Neck: no adenopathy, no carotid bruit, no JVD, supple, symmetrical, trachea midline, and thyroid not enlarged, symmetric, no tenderness/mass/nodules Lymph nodes: Cervical, supraclavicular, and axillary nodes normal. Resp: coarse w/upper airway pseudowheeze- improved with cough Back: symmetric, no curvature. ROM normal. No CVA tenderness. Cardio: regular rate and rhythm, S1, S2 normal, no murmur, click, rub or gallop GI: obese, mild epigastric tenderness Extremities: palpable pedal pulses Neurologic: Grossly normal  Diagnostic Studies & Laboratory data:     Recent Radiology Findings:   CARDIAC CATHETERIZATION  Result Date: 01/09/2022   Mid LM to Dist LM lesion is 60% stenosed.   Dist LM to Ost LAD lesion is 90%  stenosed.   Mid LAD lesion is 60% stenosed with 75% stenosed side branch in 1st Diag.   Prox Cx lesion is 70% stenosed with 75% stenosed side branch in LPAV.   Mid RCA-2 lesion is 80% stenosed.   Hemodynamic findings consistent with SEVERE PULMONARY HYPERTENSION.   There is no aortic valve stenosis.   There is moderate (3+) mitral regurgitation. Acute anterolateral STEMI with multivessel CAD: No obvious culprit lesion to fix other than the left main.  With her tenuous CHF condition, not safe to perform without support. = 60% distal LM, 90% ostial LAD, 75% bifurcation LAD-D1, 70% mid LCx involving AVG LCx ostium, 80% distal RCA  Severe Combined Systolic and Diastolic Heart Failure: EF ~20 to 25% with Anterior/Anterolateral Akinesis, LVEDP and PCWP of 36 to 37 mmHg. Moderate MR Severe Pulmonary Venous Hypertension: PAP-mean 55/35-46 mmHg, RVP-RVEDP 54/29-17 million mercury, RAP 18 mmHg; PCWP 37 mmHg with LVP-EDP 126/48-36 mmHg; AoP-MAP 113/66-80 mmHg. Telemetry evidence of sinus rhythm with intermittent A-fib-RVR Not in cardiogenic shock => based on her body habitus, and needing to be on a wedge pillow, it shows to avoid instrumenting her with mechanical support. Intermittent A-fib RECOMMENDATIONS Admit to CVICU, restart IV heparin 2 hours after TR band removal, IV diuresis, and IV NTG.> IV amiodarone to maintain sinus rhythm Hold off on antiplatelet agent pending CVTS consultation-would need to "tune up "diuresis/management of CHF prior to either CABG or high risk PCI. Would need heart team approach to determine best course of action.  With distal left main-ostial LAD involved and reduced EF, need to consider the possibility of mechanical support. PCCM consult to assist with BiPAP Bryan Lemma, MD  DG Chest [facility] 1 View  Result Date: 01/09/2022 CLINICAL DATA:  Shortness of breath. EXAM: PORTABLE CHEST 1 VIEW COMPARISON:  08/24/2013. FINDINGS: The heart is enlarged the mediastinal contour is within normal limits. Atherosclerotic calcification of the aorta is noted. The pulmonary vasculature is distended. There is diffuse interstitial prominence with patchy airspace disease predominantly in the mid to lower lung fields. No effusion or pneumothorax. No acute osseous abnormality. IMPRESSION: 1. Cardiomegaly with pulmonary vascular congestion. 2. Interstitial prominence bilaterally with patchy airspace disease at the lung bases, possible edema or infiltrate. Electronically Signed   By: Thornell Sartorius M.D.   On: 01/09/2022 00:22   VAS US DOPPLER PRE CABG  Result Date: 01/09/2022 PREOPERATIVE VASCULAR EVALUATION Patient Name:  Brittany Jackson   Date of Exam:   01/09/2022 Medical Rec #: [id number]      Accession #:    [id number] Date of Birth: 1940-09-28      Patient Gender: F Patient Age:   35 years Exam Location:  [facility] Procedure:      VAS US DOPPLER PRE CABG Referring Phys: Theron Arista Itzae Mccurdy --------------------------------------------------------------------------------  Indications:      Pre-CABG. Risk Factors:     Hypertension. Comparison Study: No prior study Performing Technologist: Gertie Fey MHA, RDMS, RVT, RDCS  Examination Guidelines: A complete evaluation includes B-mode imaging, spectral Doppler, color Doppler, and power Doppler as needed of all accessible portions of each vessel. Bilateral testing is considered an integral part of a complete examination. Limited examinations for reoccurring indications may be performed as noted.  Right Carotid Findings: +----------+--------+-------+--------+----------------------+------------------+           PSV cm/sEDV    StenosisDescribe              Comments  cm/s                                                    +----------+--------+-------+--------+----------------------+------------------+ CCA Prox  44      10                                                      +----------+--------+-------+--------+----------------------+------------------+ CCA Distal35      9                                    intimal thickening +----------+--------+-------+--------+----------------------+------------------+ ICA Prox  42      16             smooth and                                                                heterogenous                             +----------+--------+-------+--------+----------------------+------------------+ ICA Distal74      18                                                      +----------+--------+-------+--------+----------------------+------------------+ ECA       21      6               smooth and                                                                heterogenous                             +----------+--------+-------+--------+----------------------+------------------+ +----------+--------+-------+----------------+------------+           PSV cm/sEDV cmsDescribe        Arm Pressure +----------+--------+-------+----------------+------------+ [id number]             Multiphasic, WNL             +----------+--------+-------+----------------+------------+ +---------+--------+--+--------+--+---------+ VertebralPSV cm/s39EDV cm/s11Antegrade +---------+--------+--+--------+--+---------+ Left Carotid Findings: +----------+--------+--------+--------+-------------------------+---------+           PSV cm/sEDV cm/sStenosisDescribe                 Comments  +----------+--------+--------+--------+-------------------------+---------+ CCA Prox  48      11                                                 +----------+--------+--------+--------+-------------------------+---------+  CCA Distal35      9               smooth and hyperechoic             +----------+--------+--------+--------+-------------------------+---------+ ICA Prox  27      10              heterogenous and calcificShadowing +----------+--------+--------+--------+-------------------------+---------+ ICA Distal45      12                                                 +----------+--------+--------+--------+-------------------------+---------+ ECA       47      6               heterogenous                       +----------+--------+--------+--------+-------------------------+---------+  +----------+--------+--------+----------------+------------+ SubclavianPSV cm/sEDV cm/sDescribe        Arm Pressure +----------+--------+--------+----------------+------------+           48              Multiphasic, WNL              +----------+--------+--------+----------------+------------+ +---------+--------+--+--------+--+---------+ VertebralPSV cm/s56EDV cm/s18Antegrade +---------+--------+--+--------+--+---------+  ABI Findings: +--------+------------------+-----+---------+--------+ Right   Rt Pressure (mmHg)IndexWaveform Comment  +--------+------------------+-----+---------+--------+ Brachial                       triphasic         +--------+------------------+-----+---------+--------+ PTA                            triphasic         +--------+------------------+-----+---------+--------+ DP                             biphasic          +--------+------------------+-----+---------+--------+ +--------+------------------+-----+---------+-------+ Left    Lt Pressure (mmHg)IndexWaveform Comment +--------+------------------+-----+---------+-------+ Brachial                       triphasic        +--------+------------------+-----+---------+-------+ PTA                            triphasic        +--------+------------------+-----+---------+-------+ DP                             triphasic        +--------+------------------+-----+---------+-------+  Right Doppler Findings: +-----------+--------+-----+---------+-----------------------------------------+ Site       PressureIndexDoppler  Comments                                  +-----------+--------+-----+---------+-----------------------------------------+ Brachial                triphasic                                          +-----------+--------+-----+---------+-----------------------------------------+ Radial  triphasic                                          +-----------+--------+-----+---------+-----------------------------------------+ Ulnar                   triphasic                                          +-----------+--------+-----+---------+-----------------------------------------+  Palmar Arch                      Signal is unaffected with radial                                           compression, obliterates with ulnar                                        compression                               +-----------+--------+-----+---------+-----------------------------------------+  Left Doppler Findings: +-----------+--------+-----+---------+-----------------------------------------+ Site       PressureIndexDoppler  Comments                                  +-----------+--------+-----+---------+-----------------------------------------+ Brachial                triphasic                                          +-----------+--------+-----+---------+-----------------------------------------+ Radial                  triphasic                                          +-----------+--------+-----+---------+-----------------------------------------+ Ulnar                   triphasic                                          +-----------+--------+-----+---------+-----------------------------------------+ Palmar Arch                      Signal is unaffected with radial                                           compression, decreaes 50% with ulnar                                       compression                               +-----------+--------+-----+---------+-----------------------------------------+  Summary: Right Carotid: Velocities in the right ICA are consistent with a 1-39% stenosis. Left Carotid: Velocities in the left ICA are consistent with a 1-39% stenosis. Vertebrals:  Bilateral vertebral arteries demonstrate antegrade flow. Subclavians: Normal flow hemodynamics were seen in bilateral subclavian              arteries. ABI: Bilateral pedal waveforms are within normal limits at rest. Right Upper Extremity: Doppler waveforms remain within normal limits with right radial compression. Doppler waveform obliterate with right ulnar  compression. Left Upper Extremity: Doppler waveforms remain within normal limits with left radial compression. Doppler waveforms decrease 50% with left ulnar compression.      Preliminary      I have independently reviewed the above radiologic studies and discussed with the patient   Recent Lab Findings: Lab Results  Component Value Date   WBC 21.5 (H) 01/09/2022   HGB 13.7 01/09/2022   HCT 41.4 01/09/2022   PLT 328 01/09/2022   GLUCOSE 175 (H) 01/09/2022   CHOL 268 (H) 01/09/2022   TRIG 148 01/09/2022   HDL 74 01/09/2022   LDLCALC 164 (H) 01/09/2022   ALT 23 01/08/2022   AST 32 01/08/2022   NA 133 (L) 01/09/2022   K 5.8 (H) 01/09/2022   CL 106 01/09/2022   CREATININE 1.33 (H) 01/09/2022   BUN 14 01/09/2022   CO2 18 (L) 01/09/2022   TSH 1.906 01/09/2022   INR 0.9 01/09/2022   HGBA1C 5.7 (H) 01/09/2022      Assessment / Plan: Severe left main and three-vessel coronary artery disease in the setting of STEMI with combined systolic and diastolic heart failure. Hypertension Tobacco abuse  Plan: Heart team planning for best revascularization options.  She will require significant medical stabilization and optimization prior to proceeding.  Dr. Maren Beach will evaluate the patient and relevant studies to determine CT surgical perspective. I  spent 30 minutes counseling the patient face to face.   Rowe Clack, PA-C  01/09/2022 11:28 AM  Patient examined, images of coronary care gram and echocardiogram and chest x-ray personally reviewed and counseled patient.  Agree with above assessment by Gershon Crane, PA-C.  Active 81 year old hypertensive smoker admitted with acute shortness of breath and chest pain with chest pain and markedly elevated cardiac enzymes and rapid atrial fibrillation and hypertension.  She was determined to be a code STEMI and underwent urgent catheterization which demonstrates significant left main and proximal LAD stenosis with moderate stenosis of the mid large  circumflex and the mid RCA.  LV function is severely decreased with akinesia of the anterior apical wall.  There is also moderate to severe mitral regurgitation, pulmonary hypertension and elevated CVP.  Her cardiac enzymes are still rising, she is oliguric with rising creatinine and chest x-ray shows pulmonary edema and she requires BiPAP intermittently.  Patient currently does not meet criteria for urgent CABG/mitral valve replacement.  She needs myocardial recovery, and improvement in her pulmonary and renal status.  The patient has history of vein stripping in her left leg but not her right leg.  She would need vein mapping before deciding to proceed with  high risk cardiac surgery.  We will follow.  patient examined and medical record reviewed,agree with above note. Lovett Sox 01/09/2022

## 2022-01-09 NOTE — Progress Notes (Signed)
   01/09/22 1535  Clinical Encounter Type  Visited With Patient and family together  Visit Type Initial;Spiritual support;Other (Comment) (Advanced Directive)  Referral From Nurse  Consult/Referral To Chaplain   Chaplain responded to a spiritual consult for advanced directive education.  I went over the paperwork with the patient and her agent. They plan to complete the information this evening. When it is done I explained that if they will lot the nurse know they will contact us to make notary arrangements.   Danice Goltz [facility]  [phone]

## 2022-01-09 NOTE — Progress Notes (Signed)
Peripherally Inserted Central Catheter Placement  The IV Nurse has discussed with the patient and/or persons authorized to consent for the patient, the purpose of this procedure and the potential benefits and risks involved with this procedure.  The benefits include less needle sticks, lab draws from the catheter, and the patient may be discharged home with the catheter. Risks include, but not limited to, infection, bleeding, blood clot (thrombus formation), and puncture of an artery; nerve damage and irregular heartbeat and possibility to perform a PICC exchange if needed/ordered by physician.  Alternatives to this procedure were also discussed.  Bard Power PICC patient education guide, fact sheet on infection prevention and patient information card has been provided to patient /or left at bedside.    Consent obtained with daughter at bedside  PICC Placement Documentation  PICC Triple Lumen 48/25/00 Left Basilic 44 cm 0 cm (Active)  Indication for Insertion or Continuance of Line Vasoactive infusions 01/09/22 1900  Exposed Catheter (cm) 0 cm 01/09/22 1900  Site Assessment Clean, Dry, Intact 01/09/22 1900  Lumen #1 Status Flushed;Saline locked;Blood return noted 01/09/22 1900  Lumen #2 Status Flushed;Saline locked;Blood return noted 01/09/22 1900  Lumen #3 Status Flushed;Saline locked;Blood return noted 01/09/22 1900  Dressing Type Transparent;Securing device 01/09/22 1900  Dressing Status Antimicrobial disc in place;Clean, Dry, Intact 01/09/22 1900  Safety Lock Not Applicable 37/04/88 8916  Line Care Connections checked and tightened 01/09/22 1900  Dressing Intervention New dressing 01/09/22 1900  Dressing Change Due 01/16/22 01/09/22 1900       Darlyn Read 01/09/2022, 7:31 PM

## 2022-01-10 ENCOUNTER — Encounter (HOSPITAL_COMMUNITY): Payer: Medicare Other

## 2022-01-10 ENCOUNTER — Inpatient Hospital Stay (HOSPITAL_COMMUNITY): Payer: Medicare Other

## 2022-01-10 DIAGNOSIS — I2510 Atherosclerotic heart disease of native coronary artery without angina pectoris: Secondary | ICD-10-CM | POA: Diagnosis not present

## 2022-01-10 DIAGNOSIS — I5041 Acute combined systolic (congestive) and diastolic (congestive) heart failure: Secondary | ICD-10-CM | POA: Diagnosis not present

## 2022-01-10 DIAGNOSIS — I340 Nonrheumatic mitral (valve) insufficiency: Secondary | ICD-10-CM | POA: Diagnosis not present

## 2022-01-10 DIAGNOSIS — I83813 Varicose veins of bilateral lower extremities with pain: Secondary | ICD-10-CM

## 2022-01-10 LAB — COMPREHENSIVE METABOLIC PANEL
ALT: 18 U/L (ref 0–44)
AST: 34 U/L (ref 15–41)
Albumin: 3.2 g/dL — ABNORMAL LOW (ref 3.5–5.0)
Alkaline Phosphatase: 79 U/L (ref 38–126)
Anion gap: 10 (ref 5–15)
BUN: 23 mg/dL (ref 8–23)
CO2: 22 mmol/L (ref 22–32)
Calcium: 8.6 mg/dL — ABNORMAL LOW (ref 8.9–10.3)
Chloride: 97 mmol/L — ABNORMAL LOW (ref 98–111)
Creatinine, Ser: 1.91 mg/dL — ABNORMAL HIGH (ref 0.44–1.00)
GFR, Estimated: 26 mL/min — ABNORMAL LOW (ref >60)
Glucose, Bld: 122 mg/dL — ABNORMAL HIGH (ref 70–99)
Potassium: 4.6 mmol/L (ref 3.5–5.1)
Sodium: 129 mmol/L — ABNORMAL LOW (ref 135–145)
Total Bilirubin: 0.4 mg/dL (ref 0.3–1.2)
Total Protein: 6 g/dL — ABNORMAL LOW (ref 6.5–8.1)

## 2022-01-10 LAB — LACTIC ACID, PLASMA: Lactic Acid, Venous: 1.1 mmol/L (ref 0.5–1.9)

## 2022-01-10 LAB — CBC
HCT: 35.9 % — ABNORMAL LOW (ref 36.0–46.0)
Hemoglobin: 12.1 g/dL (ref 12.0–15.0)
MCH: 30.3 pg (ref 26.0–34.0)
MCHC: 33.7 g/dL (ref 30.0–36.0)
MCV: 89.8 fL (ref 80.0–100.0)
Platelets: 311 10*3/uL (ref 150–400)
RBC: 4 MIL/uL (ref 3.87–5.11)
RDW: 13.7 % (ref 11.5–15.5)
WBC: 20.5 10*3/uL — ABNORMAL HIGH (ref 4.0–10.5)
nRBC: 0 % (ref 0.0–0.2)

## 2022-01-10 LAB — BASIC METABOLIC PANEL
Anion gap: 13 (ref 5–15)
BUN: 31 mg/dL — ABNORMAL HIGH (ref 8–23)
CO2: 19 mmol/L — ABNORMAL LOW (ref 22–32)
Calcium: 8.3 mg/dL — ABNORMAL LOW (ref 8.9–10.3)
Chloride: 94 mmol/L — ABNORMAL LOW (ref 98–111)
Creatinine, Ser: 2.21 mg/dL — ABNORMAL HIGH (ref 0.44–1.00)
GFR, Estimated: 22 mL/min — ABNORMAL LOW (ref >60)
Glucose, Bld: 119 mg/dL — ABNORMAL HIGH (ref 70–99)
Potassium: 5.2 mmol/L — ABNORMAL HIGH (ref 3.5–5.1)
Sodium: 126 mmol/L — ABNORMAL LOW (ref 135–145)

## 2022-01-10 LAB — POCT I-STAT 7, (LYTES, BLD GAS, ICA,H+H)
Acid-Base Excess: 0 mmol/L (ref 0.0–2.0)
Bicarbonate: 24.4 mmol/L (ref 20.0–28.0)
Calcium, Ion: 1.21 mmol/L (ref 1.15–1.40)
HCT: 37 % (ref 36.0–46.0)
Hemoglobin: 12.6 g/dL (ref 12.0–15.0)
O2 Saturation: 97 %
Patient temperature: 97.2
Potassium: 4.6 mmol/L (ref 3.5–5.1)
Sodium: 130 mmol/L — ABNORMAL LOW (ref 135–145)
TCO2: 26 mmol/L (ref 22–32)
pCO2 arterial: 38.7 mmHg (ref 32–48)
pH, Arterial: 7.404 (ref 7.35–7.45)
pO2, Arterial: 87 mmHg (ref 83–108)

## 2022-01-10 LAB — HEPARIN LEVEL (UNFRACTIONATED): Heparin Unfractionated: 0.38 IU/mL (ref 0.30–0.70)

## 2022-01-10 LAB — COOXEMETRY PANEL
Carboxyhemoglobin: 1.8 % — ABNORMAL HIGH (ref 0.5–1.5)
Methemoglobin: <0.7 % (ref 0.0–1.5)
O2 Saturation: 69 %
Total hemoglobin: 12.1 g/dL (ref 12.0–16.0)

## 2022-01-10 LAB — LIPOPROTEIN A (LPA): Lipoprotein (a): 36.2 nmol/L — ABNORMAL HIGH (ref <75.0)

## 2022-01-10 LAB — GLUCOSE, CAPILLARY: Glucose-Capillary: 145 mg/dL — ABNORMAL HIGH (ref 70–99)

## 2022-01-10 LAB — MAGNESIUM: Magnesium: 1.5 mg/dL — ABNORMAL LOW (ref 1.7–2.4)

## 2022-01-10 LAB — SURGICAL PCR SCREEN
MRSA, PCR: NEGATIVE
Staphylococcus aureus: NEGATIVE

## 2022-01-10 MED ORDER — CEFEPIME HCL 2 G IV SOLR
2.0000 g | Freq: Two times a day (BID) | INTRAVENOUS | Status: DC
Start: 2022-01-10 — End: 2022-01-10
  Administered 2022-01-10: 2 g via INTRAVENOUS
  Filled 2022-01-10: qty 12.5

## 2022-01-10 MED ORDER — MAGNESIUM SULFATE 4 GM/100ML IV SOLN
4.0000 g | Freq: Once | INTRAVENOUS | Status: AC
  Administered 2022-01-10: 4 g via INTRAVENOUS
  Filled 2022-01-10: qty 100

## 2022-01-10 MED ORDER — AMIODARONE LOAD VIA INFUSION
150.0000 mg | Freq: Once | INTRAVENOUS | Status: AC
  Administered 2022-01-10: 150 mg via INTRAVENOUS
  Filled 2022-01-10: qty 83.34

## 2022-01-10 MED ORDER — SODIUM CHLORIDE 0.9 % IV SOLN
2.0000 g | INTRAVENOUS | Status: DC
  Administered 2022-01-11 – 2022-01-13 (×3): 2 g via INTRAVENOUS
  Filled 2022-01-10 (×4): qty 12.5

## 2022-01-10 MED ORDER — FLUTICASONE FUROATE-VILANTEROL 200-25 MCG/ACT IN AEPB
1.0000 | INHALATION_SPRAY | Freq: Every day | RESPIRATORY_TRACT | Status: DC
  Administered 2022-01-11 – 2022-01-21 (×11): 1 via RESPIRATORY_TRACT
  Filled 2022-01-10: qty 28

## 2022-01-10 MED ORDER — GUAIFENESIN ER 600 MG PO TB12
600.0000 mg | ORAL_TABLET | Freq: Two times a day (BID) | ORAL | Status: DC
  Administered 2022-01-10 – 2022-01-21 (×22): 600 mg via ORAL
  Filled 2022-01-10 (×22): qty 1

## 2022-01-10 MED ORDER — DOCUSATE SODIUM 100 MG PO CAPS
100.0000 mg | ORAL_CAPSULE | Freq: Two times a day (BID) | ORAL | Status: DC | PRN
  Administered 2022-01-10 – 2022-01-11 (×2): 100 mg via ORAL
  Filled 2022-01-10 (×2): qty 1

## 2022-01-10 MED ORDER — ENSURE ENLIVE PO LIQD
237.0000 mL | Freq: Three times a day (TID) | ORAL | Status: DC
  Administered 2022-01-11 – 2022-01-14 (×7): 237 mL via ORAL

## 2022-01-10 MED ORDER — NICOTINE 14 MG/24HR TD PT24
14.0000 mg | MEDICATED_PATCH | Freq: Every day | TRANSDERMAL | Status: DC
  Administered 2022-01-11 – 2022-01-22 (×12): 14 mg via TRANSDERMAL
  Filled 2022-01-10 (×13): qty 1

## 2022-01-10 MED ORDER — THIAMINE HCL 100 MG PO TABS
100.0000 mg | ORAL_TABLET | Freq: Every day | ORAL | Status: DC
  Administered 2022-01-11 – 2022-01-22 (×12): 100 mg via ORAL
  Filled 2022-01-10 (×13): qty 1

## 2022-01-10 MED ORDER — MILRINONE LACTATE IN DEXTROSE 20-5 MG/100ML-% IV SOLN
0.2500 ug/kg/min | INTRAVENOUS | Status: DC
  Administered 2022-01-10 – 2022-01-14 (×7): 0.25 ug/kg/min via INTRAVENOUS
  Filled 2022-01-10 (×10): qty 100

## 2022-01-10 MED FILL — Verapamil HCl IV Soln 2.5 MG/ML: INTRAVENOUS | Qty: 2 | Status: AC

## 2022-01-10 NOTE — Progress Notes (Signed)
   01/10/22 1515  Clinical Encounter Type  Visited With Patient and family together  Visit Type Follow-up  Referral From Chaplain  Consult/Referral To [facility] is ready for execution  Spiritual Encounters  Spiritual Needs Prayer;Emotional   Chaplain followed up to review completion of the HCPOA as requested. Spoke with patient, daughter Colletta [place]) and niece Juliann Pulse) regarding notary requirement. Chaplain will update family if hospital notary is not available for execution on 01/11/2022.  (Note: Patient needs this completed prior to surgery on Monday, 5/29 or Tuesday, 5/30).  Melody Haver, Resident Chaplain [phone]

## 2022-01-10 NOTE — Consult Note (Signed)
Urology Consult   Physician requesting consult: Dahlia Byes, MD  Reason for consult: Difficult foley catheter`  History of Present Illness: Brittany Jackson is a 81 y.o. admitted with CAD.  She needed a catheter for accurate recording of ins/outs.  Nursing staff was unable to place Foley catheter and thus urology was consulted.  She denies a prior urologic history.  She states she is voiding without difficulty.  She has about 1 L total urine output recorded today.  She denies a history of voiding or storage urinary symptoms, hematuria, UTIs, STDs, urolithiasis, GU malignancy/trauma/surgery.  Past Medical History:  Diagnosis Date   CHF (congestive heart failure) (HCC)    Hypertension    Urticaria     Past Surgical History:  Procedure Laterality Date   APPENDECTOMY     BACK SURGERY     CORONARY/GRAFT ACUTE MI REVASCULARIZATION N/A 01/08/2022   Procedure: Coronary/Graft Acute MI Revascularization;  Surgeon: Leonie Man, MD;  Location: [facility];  Service: Cardiovascular;  Laterality: N/A;   HUMERUS IM NAIL Left 08/24/2013   Procedure: INTRAMEDULLARY (IM) NAIL HUMERAL;  Surgeon: Nita Sells, MD;  Location: [facility];  Service: Orthopedics;  Laterality: Left;   RIGHT HEART CATH N/A 01/08/2022   Procedure: RIGHT HEART CATH;  Surgeon: Leonie Man, MD;  Location: [facility];  Service: Cardiovascular;  Laterality: N/A;   TUBAL LIGATION       Current Hospital Medications:  Home meds:  No current facility-administered medications on file prior to encounter.   Current Outpatient Medications on File Prior to Encounter  Medication Sig Dispense Refill   albuterol (PROVENTIL) 4 MG tablet Take 2 mg by mouth 3 (three) times daily.     allopurinol (ZYLOPRIM) 100 MG tablet Take 100 mg by mouth daily.     Ascorbic Acid (VITAMIN C) 1000 MG tablet Take 1,000 mg by mouth daily.     aspirin EC 81 MG tablet Take 81 mg by mouth daily.     b complex vitamins capsule Take  1 capsule by mouth daily.     cetirizine (ZYRTEC) 10 MG tablet Take 1-2 tablets 2 times per day maximum. (Patient taking differently: Take 10 mg by mouth daily as needed for allergies.) 120 tablet 1   diazepam (VALIUM) 10 MG tablet Take 10 mg by mouth 3 (three) times daily as needed for anxiety.     famotidine (PEPCID) 40 MG tablet Take 1 tablet (40 mg total) by mouth 2 (two) times daily. (Patient taking differently: Take 40 mg by mouth 2 (two) times daily as needed for heartburn or indigestion.) 60 tablet 2   methocarbamol (ROBAXIN) 500 MG tablet Take 500 mg by mouth every 8 (eight) hours as needed for muscle spasms.     Multiple Vitamins-Minerals (MULTI FOR HER 50+) TABS Take 1 tablet by mouth daily.     ramipril (ALTACE) 10 MG capsule Take 10 mg by mouth daily.     Red Yeast Rice 600 MG CAPS Take 600 mg by mouth daily.     zinc gluconate 50 MG tablet Take 50 mg by mouth daily.     famotidine (PEPCID) 20 MG tablet Take 1 tablet (20 mg total) by mouth 2 (two) times daily. (Patient not taking: Reported on 01/09/2022) 180 tablet 1   montelukast (SINGULAIR) 10 MG tablet Take 1 tablet (10 mg total) by mouth at bedtime. (Patient not taking: Reported on 01/09/2022) 90 tablet 1     Scheduled Meds:  aspirin  81 mg Oral Daily   atorvastatin  80 mg Oral Daily   Chlorhexidine Gluconate Cloth  6 each Topical Daily   famotidine  20 mg Oral BID   [START ON 01/11/2022] feeding supplement  237 mL Oral TID WC   [START ON 01/11/2022] fluticasone furoate-vilanterol  1 puff Inhalation Daily   guaiFENesin  600 mg Oral BID   montelukast  10 mg Oral QHS   nicotine  14 mg Transdermal Daily   sodium chloride flush  10-40 mL Intracatheter Q12H   sodium chloride flush  3 mL Intravenous Q12H   [START ON 01/11/2022] thiamine  100 mg Oral Daily   Continuous Infusions:  sodium chloride Stopped (01/09/22 0046)   sodium chloride     sodium chloride     amiodarone 60 mg/hr (01/10/22 1800)   ceFEPime (MAXIPIME) IV Stopped  (01/10/22 1131)   heparin 1,200 Units/hr (01/10/22 1800)   milrinone 0.25 mcg/kg/min (01/10/22 1800)   PRN Meds:.sodium chloride, Place/Maintain arterial line **AND** sodium chloride, acetaminophen, albuterol, diazepam, docusate sodium, ondansetron (ZOFRAN) IV, sodium chloride flush, sodium chloride flush, zolpidem  Allergies:  Allergies  Allergen Reactions   Codeine Other (See Comments)    Unknown   Meprobamate Swelling   Sulfa Antibiotics Other (See Comments)    Unknown    Family History  Problem Relation Age of Onset   Hypertension Sister    Allergic rhinitis Neg Hx    Asthma Neg Hx    Angioedema Neg Hx    Atopy Neg Hx    Eczema Neg Hx    Immunodeficiency Neg Hx    Urticaria Neg Hx     Social History:  reports that she has been smoking cigarettes. She has been smoking an average of .5 packs per day. She has never used smokeless tobacco. She reports current alcohol use. She reports that she does not use drugs.  ROS: A complete review of systems was performed.  All systems are negative except for pertinent findings as noted.  Physical Exam:  Vital signs in last 24 hours: Temp:  [97.4 F (36.3 C)-98.5 F (36.9 C)] 97.5 F (36.4 C) (05/25 1730) Pulse Rate:  [70-145] 115 (05/25 1845) Resp:  [16-33] 33 (05/25 1845) BP: (89-124)/(37-84) 96/40 (05/25 0400) SpO2:  [87 %-100 %] 95 % (05/25 1845) Arterial Line BP: (90-170)/(33-78) 125/51 (05/25 1845) FiO2 (%):  [40 %] 40 % (05/24 2157) Weight:  [109 kg-110 kg] 109 kg (05/25 0700) Constitutional:  Alert and oriented, No acute distress Cardiovascular: Regular rate and rhythm Respiratory: Normal respiratory effort, Lungs clear bilaterally GI: Abdomen is soft, nontender, nondistended, no abdominal masses, obese GU: No CVA tenderness Neurologic: Grossly intact, no focal deficits Psychiatric: Normal mood and affect  Laboratory Data:  Recent Labs    01/08/22 2356 01/09/22 0133 01/09/22 0158 01/09/22 0201 01/09/22 0314  01/10/22 0306 01/10/22 0309  WBC 22.8*  --   --   --  21.5*  --  20.5*  HGB 14.9   < > 13.6 14.3 13.7 12.6 12.1  HCT 45.8   < > 40.0 42.0 41.4 37.0 35.9*  PLT 390  --   --   --  328  --  311   < > = values in this interval not displayed.    Recent Labs    01/09/22 0314 01/09/22 1618 01/09/22 2101 01/10/22 0306 01/10/22 0309 01/10/22 1701  NA 133* 133* 132* 130* 129* 126*  K 5.8* 5.7* 4.8 4.6 4.6 5.2*  CL 106 102 100  --  97* 94*  GLUCOSE 175* 123* 129*  --  122* 119*  BUN _0 --  23 31*  CALCIUM 9.2 9.3 9.0  --  8.6* 8.3*  CREATININE 1.33* 1.65* 1.65*  --  1.91* 2.21*     Results for orders placed or performed during the hospital encounter of 01/08/22 (from the past 24 hour(s))  Cooxemetry Panel (carboxy, met, total hgb, O2 sat)     Status: None   Collection Time: 01/09/22  9:01 PM  Result Value Ref Range   Total hemoglobin 12.7 12.0 - 16.0 g/dL   O2 Saturation 62.1 %   Carboxyhemoglobin 1.2 0.5 - 1.5 %   Methemoglobin <0.7 0.0 - 1.5 %  Heparin level (unfractionated)     Status: None   Collection Time: 01/09/22  9:01 PM  Result Value Ref Range   Heparin Unfractionated 0.40 0.30 - 0.70 IU/mL  Troponin I (High Sensitivity)     Status: Abnormal   Collection Time: 01/09/22  9:01 PM  Result Value Ref Range   Troponin I (High Sensitivity) 3,557 (HH) <18 ng/L  Basic metabolic panel     Status: Abnormal   Collection Time: 01/09/22  9:01 PM  Result Value Ref Range   Sodium 132 (L) 135 - 145 mmol/L   Potassium 4.8 3.5 - 5.1 mmol/L   Chloride 100 98 - 111 mmol/L   CO2 22 22 - 32 mmol/L   Glucose, Bld 129 (H) 70 - 99 mg/dL   BUN 21 8 - 23 mg/dL   Creatinine, Ser 1.65 (H) 0.44 - 1.00 mg/dL   Calcium 9.0 8.9 - 10.3 mg/dL   GFR, Estimated 31 (L) >60 mL/min   Anion gap 10 5 - 15  Lactic acid, plasma     Status: None   Collection Time: 01/09/22 10:25 PM  Result Value Ref Range   Lactic Acid, Venous 1.1 0.5 - 1.9 mmol/L  I-STAT 7, (LYTES, BLD GAS, ICA, H+H)      Status: Abnormal   Collection Time: 01/10/22  3:06 AM  Result Value Ref Range   pH, Arterial 7.404 7.35 - 7.45   pCO2 arterial 38.7 32 - 48 mmHg   pO2, Arterial 87 83 - 108 mmHg   Bicarbonate 24.4 20.0 - 28.0 mmol/L   TCO2 26 22 - 32 mmol/L   O2 Saturation 97 %   Acid-Base Excess 0.0 0.0 - 2.0 mmol/L   Sodium 130 (L) 135 - 145 mmol/L   Potassium 4.6 3.5 - 5.1 mmol/L   Calcium, Ion 1.21 1.15 - 1.40 mmol/L   HCT 37.0 36.0 - 46.0 %   Hemoglobin 12.6 12.0 - 15.0 g/dL   Patient temperature 97.2 F    Sample type ARTERIAL   Heparin level (unfractionated)     Status: None   Collection Time: 01/10/22  3:09 AM  Result Value Ref Range   Heparin Unfractionated 0.38 0.30 - 0.70 IU/mL  CBC     Status: Abnormal   Collection Time: 01/10/22  3:09 AM  Result Value Ref Range   WBC 20.5 (H) 4.0 - 10.5 K/uL   RBC 4.00 3.87 - 5.11 MIL/uL   Hemoglobin 12.1 12.0 - 15.0 g/dL   HCT 35.9 (L) 36.0 - 46.0 %   MCV 89.8 80.0 - 100.0 fL   MCH 30.3 26.0 - 34.0 pg   MCHC 33.7 30.0 - 36.0 g/dL   RDW 13.7 11.5 - 15.5 %   Platelets 311 150 - 400 K/uL   nRBC 0.0 0.0 -  0.2 %  Comprehensive metabolic panel     Status: Abnormal   Collection Time: 01/10/22  3:09 AM  Result Value Ref Range   Sodium 129 (L) 135 - 145 mmol/L   Potassium 4.6 3.5 - 5.1 mmol/L   Chloride 97 (L) 98 - 111 mmol/L   CO2 22 22 - 32 mmol/L   Glucose, Bld 122 (H) 70 - 99 mg/dL   BUN 23 8 - 23 mg/dL   Creatinine, Ser 1.91 (H) 0.44 - 1.00 mg/dL   Calcium 8.6 (L) 8.9 - 10.3 mg/dL   Total Protein 6.0 (L) 6.5 - 8.1 g/dL   Albumin 3.2 (L) 3.5 - 5.0 g/dL   AST 34 15 - 41 U/L   ALT 18 0 - 44 U/L   Alkaline Phosphatase 79 38 - 126 U/L   Total Bilirubin 0.4 0.3 - 1.2 mg/dL   GFR, Estimated 26 (L) >60 mL/min   Anion gap 10 5 - 15  Cooxemetry Panel (carboxy, met, total hgb, O2 sat)     Status: Abnormal   Collection Time: 01/10/22  3:09 AM  Result Value Ref Range   Total hemoglobin 12.1 12.0 - 16.0 g/dL   O2 Saturation 69 %    Carboxyhemoglobin 1.8 (H) 0.5 - 1.5 %   Methemoglobin <0.7 0.0 - 1.5 %  Surgical pcr screen     Status: None   Collection Time: 01/10/22  8:47 AM   Specimen: Nasal Mucosa; Nasal Swab  Result Value Ref Range   MRSA, PCR NEGATIVE NEGATIVE   Staphylococcus aureus NEGATIVE NEGATIVE  Expectorated Sputum Assessment w Gram Stain, Rflx to Resp Cult     Status: None (Preliminary result)   Collection Time: 01/10/22  8:49 AM   Specimen: Expectorated Sputum  Result Value Ref Range   Specimen Description EXPECTORATED SPUTUM    Special Requests Immunocompromised    Sputum evaluation      THIS SPECIMEN IS ACCEPTABLE FOR SPUTUM CULTURE Performed at [facility], [place] [address] [place], [place] [address]    Report Status PENDING   Culture, Respiratory w Gram Stain     Status: None (Preliminary result)   Collection Time: 01/10/22  8:49 AM  Result Value Ref Range   Specimen Description EXPECTORATED SPUTUM    Special Requests Immunocompromised Reflexed from [id number]    Gram Stain      NO WBC SEEN MODERATE GRAM POSITIVE COCCI IN PAIRS FEW GRAM POSITIVE COCCI IN CLUSTERS RARE GRAM POSITIVE RODS Performed at [facility], [place] [address] [place], [place] [address]    Culture PENDING    Report Status PENDING   Glucose, capillary     Status: Abnormal   Collection Time: 01/10/22  1:41 PM  Result Value Ref Range   Glucose-Capillary 145 (H) 70 - 99 mg/dL  Magnesium     Status: Abnormal   Collection Time: 01/10/22  1:47 PM  Result Value Ref Range   Magnesium 1.5 (L) 1.7 - 2.4 mg/dL  Basic metabolic panel     Status: Abnormal   Collection Time: 01/10/22  5:01 PM  Result Value Ref Range   Sodium 126 (L) 135 - 145 mmol/L   Potassium 5.2 (H) 3.5 - 5.1 mmol/L   Chloride 94 (L) 98 - 111 mmol/L   CO2 19 (L) 22 - 32 mmol/L   Glucose, Bld 119 (H) 70 - 99 mg/dL   BUN 31 (H) 8 - 23 mg/dL   Creatinine, Ser 2.21 (H) 0.44 - 1.00 mg/dL  Calcium 8.3 (L) 8.9 - 10.3 mg/dL   GFR, Estimated 22 (L) >60  mL/min   Anion gap 13 5 - 15   Recent Results (from the past 240 hour(s))  Surgical pcr screen     Status: None   Collection Time: 01/09/22  2:52 AM   Specimen: Nasal Mucosa; Nasal Swab  Result Value Ref Range Status   MRSA, PCR NEGATIVE NEGATIVE Final   Staphylococcus aureus NEGATIVE NEGATIVE Final    Comment: (NOTE) The Xpert SA Assay (FDA approved for NASAL specimens in patients 71 years of age and older), is one component of a comprehensive surveillance program. It is not intended to diagnose infection nor to guide or monitor treatment. Performed at [facility], [place] [address] [place], [place] [address]   Resp Panel by RT-PCR (Flu A&B, Covid) Nasal Mucosa     Status: None   Collection Time: 01/09/22  5:57 AM   Specimen: Nasal Mucosa; Nasopharyngeal(NP) swabs in vial transport medium  Result Value Ref Range Status   SARS Coronavirus 2 by RT PCR NEGATIVE NEGATIVE Final    Comment: (NOTE) SARS-CoV-2 target nucleic acids are NOT DETECTED.  The SARS-CoV-2 RNA is generally detectable in upper respiratory specimens during the acute phase of infection. The lowest concentration of SARS-CoV-2 viral copies this assay can detect is 138 copies/mL. A negative result does not preclude SARS-Cov-2 infection and should not be used as the sole basis for treatment or other patient management decisions. A negative result may occur with  improper specimen collection/handling, submission of specimen other than nasopharyngeal swab, presence of viral mutation(s) within the areas targeted by this assay, and inadequate number of viral copies(<138 copies/mL). A negative result must be combined with clinical observations, patient history, and epidemiological information. The expected result is Negative.  Fact Sheet for Patients:  EntrepreneurPulse.com.au  Fact Sheet for Healthcare Providers:  IncredibleEmployment.be  This test is no t yet approved or  cleared by the [place] FDA and  has been authorized for detection and/or diagnosis of SARS-CoV-2 by FDA under an Emergency Use Authorization (EUA). This EUA will remain  in effect (meaning this test can be used) for the duration of the COVID-19 declaration under Section 564(b)(1) of the Act, 21 U.S.C.section 360bbb-3(b)(1), unless the authorization is terminated  or revoked sooner.       Influenza A by PCR NEGATIVE NEGATIVE Final   Influenza B by PCR NEGATIVE NEGATIVE Final    Comment: (NOTE) The Xpert Xpress SARS-CoV-2/FLU/RSV plus assay is intended as an aid in the diagnosis of influenza from Nasopharyngeal swab specimens and should not be used as a sole basis for treatment. Nasal washings and aspirates are unacceptable for Xpert Xpress SARS-CoV-2/FLU/RSV testing.  Fact Sheet for Patients: EntrepreneurPulse.com.au  Fact Sheet for Healthcare Providers: IncredibleEmployment.be  This test is not yet approved or cleared by the [place] FDA and has been authorized for detection and/or diagnosis of SARS-CoV-2 by FDA under an Emergency Use Authorization (EUA). This EUA will remain in effect (meaning this test can be used) for the duration of the COVID-19 declaration under Section 564(b)(1) of the Act, 21 U.S.C. section 360bbb-3(b)(1), unless the authorization is terminated or revoked.  Performed at [facility], [place] [address] [place], [place] [address]   Surgical pcr screen     Status: None   Collection Time: 01/10/22  8:47 AM   Specimen: Nasal Mucosa; Nasal Swab  Result Value Ref Range Status   MRSA, PCR NEGATIVE NEGATIVE Final  Staphylococcus aureus NEGATIVE NEGATIVE Final    Comment: (NOTE) The Xpert SA Assay (FDA approved for NASAL specimens in patients 42 years of age and older), is one component of a comprehensive surveillance program. It is not intended to diagnose infection nor to guide or monitor  treatment. Performed at [facility], [place] [address] [place], [place] [address]   Expectorated Sputum Assessment w Gram Stain, Rflx to Resp Cult     Status: None (Preliminary result)   Collection Time: 01/10/22  8:49 AM   Specimen: Expectorated Sputum  Result Value Ref Range Status   Specimen Description EXPECTORATED SPUTUM  Final   Special Requests Immunocompromised  Final   Sputum evaluation   Final    THIS SPECIMEN IS ACCEPTABLE FOR SPUTUM CULTURE Performed at [facility], [place] [address] [place], [place] [address]    Report Status PENDING  Incomplete  Culture, Respiratory w Gram Stain     Status: None (Preliminary result)   Collection Time: 01/10/22  8:49 AM  Result Value Ref Range Status   Specimen Description EXPECTORATED SPUTUM  Final   Special Requests Immunocompromised Reflexed from [id number]  Final   Gram Stain   Final    NO WBC SEEN MODERATE GRAM POSITIVE COCCI IN PAIRS FEW GRAM POSITIVE COCCI IN CLUSTERS RARE GRAM POSITIVE RODS Performed at [facility], [place] [address] [place], [place] [address]    Culture PENDING  Incomplete   Report Status PENDING  Incomplete    Renal Function: Recent Labs    01/08/22 2356 01/09/22 0133 01/09/22 0314 01/09/22 1618 01/09/22 2101 01/10/22 0309 01/10/22 1701  CREATININE 1.11* 1.10* 1.33* 1.65* 1.65* 1.91* 2.21*   Estimated Creatinine Clearance: 24.1 mL/min (A) (by C-G formula based on SCr of 2.21 mg/dL (H)).  Radiologic Imaging: CARDIAC CATHETERIZATION  Result Date: 01/09/2022   Mid LM to Dist LM lesion is 60% stenosed.   Dist LM to Ost LAD lesion is 90% stenosed.   Mid LAD lesion is 60% stenosed with 75% stenosed side branch in 1st Diag.   Prox Cx lesion is 70% stenosed with 75% stenosed side branch in LPAV.   Mid RCA-2 lesion is 80% stenosed.   Hemodynamic findings consistent with SEVERE PULMONARY HYPERTENSION.   There is no aortic valve stenosis.   There is moderate (3+) mitral regurgitation. Acute  anterolateral STEMI with multivessel CAD: No obvious culprit lesion to fix other than the left main.  With her tenuous CHF condition, not safe to perform without support. 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 Moderate MR Severe Pulmonary Venous Hypertension: PAP-mean 55/35-46 mmHg, RVP-RVEDP 54/29-17 million mercury, RAP 18 mmHg; PCWP 37 mmHg with LVP-EDP 126/48-36 mmHg; AoP-MAP 113/66-80 mmHg. Telemetry evidence of sinus rhythm with intermittent A-fib-RVR Not in cardiogenic shock => based on her body habitus, and needing to be on a wedge pillow, it shows to avoid instrumenting her with mechanical support. Intermittent A-fib RECOMMENDATIONS Admit to CVICU, restart IV heparin 2 hours after TR band removal, IV diuresis, and IV NTG.> IV amiodarone to maintain sinus rhythm Hold off on antiplatelet agent pending CVTS consultation-would need to "tune up "diuresis/management of CHF prior to either CABG or high risk PCI. Would need heart team approach to determine best course of action.  With distal left main-ostial  LAD involved and reduced EF, need to consider the possibility of mechanical support. PCCM consult to assist with BiPAP Glenetta Hew, MD  DG Chest [facility] 1 View  Result Date: 01/10/2022 CLINICAL DATA:  Shortness of breath EXAM: PORTABLE CHEST 1 VIEW COMPARISON:  01/09/2022 FINDINGS: Persistent primarily interstitial bilateral opacities with improved aeration compared to the prior study. Stable cardiomediastinal contours. No significant pleural effusion. No pneumothorax. IMPRESSION: Persistent bilateral opacities with some improvement in aeration since 01/09/2022. Electronically Signed   By: Macy Mis M.D.   On: 01/10/2022 08:28   DG Chest Port 1 View  Result Date: 01/09/2022 CLINICAL DATA:   Shortness of breath. EXAM: PORTABLE CHEST 1 VIEW COMPARISON:  08/24/2013. FINDINGS: The heart is enlarged the mediastinal contour is within normal limits. Atherosclerotic calcification of the aorta is noted. The pulmonary vasculature is distended. There is diffuse interstitial prominence with patchy airspace disease predominantly in the mid to lower lung fields. No effusion or pneumothorax. No acute osseous abnormality. IMPRESSION: 1. Cardiomegaly with pulmonary vascular congestion. 2. Interstitial prominence bilaterally with patchy airspace disease at the lung bases, possible edema or infiltrate. Electronically Signed   By: Brett Fairy M.D.   On: 01/09/2022 00:22   VAS [place] LOWER EXTREMITY SAPHENOUS VEIN MAPPING  Result Date: 01/10/2022 LOWER EXTREMITY VEIN MAPPING Patient Name:  AVANI SENSABAUGH  Date of Exam:   01/10/2022 Medical Rec #: [id number]      Accession #:    [id number] Date of Birth: Jun 16, 1941      Patient Gender: F Patient Age:   55 years Exam Location:  [facility] Procedure:      VAS [place] LOWER EXTREMITY SAPHENOUS VEIN MAPPING Referring Phys: Dahlia Byes --------------------------------------------------------------------------------  Indications:  Varicose vein ligation Risk Factors: Hypertension.  Comparison Study: No prior studies. Performing Technologist: Oliver Hum RVT  Examination Guidelines: A complete evaluation includes B-mode imaging, spectral Doppler, color Doppler, and power Doppler as needed of all accessible portions of each vessel. Bilateral testing is considered an integral part of a complete examination. Limited examinations for reoccurring indications may be performed as noted. +---------------+-----------+----------------------+---------------+-----------+   RT Diameter  RT Findings         GSV            LT Diameter  LT Findings      (cm)                                            (cm)                   +---------------+-----------+----------------------+---------------+-----------+      0.70                     Saphenofemoral         1.00                                                   Junction                                  +---------------+-----------+----------------------+---------------+-----------+      0.53  Proximal thigh         0.42       branching  +---------------+-----------+----------------------+---------------+-----------+      0.50       branching       Mid thigh            0.17       branching  +---------------+-----------+----------------------+---------------+-----------+      0.36                      Distal thigh          0.41       branching  +---------------+-----------+----------------------+---------------+-----------+      0.35       branching          Knee              0.44       branching  +---------------+-----------+----------------------+---------------+-----------+      0.43       branching       Prox calf            0.36                  +---------------+-----------+----------------------+---------------+-----------+      0.27       branching        Mid calf            0.42       branching  +---------------+-----------+----------------------+---------------+-----------+      0.23       branching      Distal calf           0.48       branching  +---------------+-----------+----------------------+---------------+-----------+ Diagnosing physician: Jamelle Haring Electronically signed by Jamelle Haring on 01/10/2022 at 4:53:39 PM.    Final    ECHOCARDIOGRAM COMPLETE  Result Date: 01/09/2022    ECHOCARDIOGRAM REPORT   Patient Name:   RAFAELLA KOLE Date of Exam: 01/09/2022 Medical Rec #:  [id number]     Height:       66.0 in Accession #:    [id number]    Weight:       248.2 lb Date of Birth:  [birth year]     BSA:          2.192 m Patient Age:    74 years      BP:           128/75 mmHg Patient Gender: F              HR:           68 bpm. Exam Location:  Inpatient Procedure: 2D Echo, Cardiac Doppler and Color Doppler Indications:    Myocardial infarction  History:        Patient has prior history of Echocardiogram examinations, most                 recent 04/09/2012. CHF; Risk Factors:Hypertension.  Sonographer:    Joette Catching RCS Referring Phys: [place]  1. Left ventricular ejection fraction, by estimation, is 20 to 25%. The left ventricle has severely decreased function. The left ventricle demonstrates regional wall motion abnormalities with mid to apical anterior, anteroseptal, inferoseptal, and inferior akinesis, akinesis of the apical lateral wall, and akinesis of the true apex. No LV thrombus visualized. There is mild left ventricular hypertrophy. Left ventricular diastolic parameters are consistent with Grade II diastolic dysfunction (pseudonormalization).  2. Right ventricular systolic function is mildly reduced. The right ventricular  size is normal. There is mildly elevated pulmonary artery systolic pressure. The estimated right ventricular systolic pressure is 26.8 mmHg.  3. The mitral valve is degenerative. Trivial mitral valve regurgitation. No evidence of mitral stenosis. Moderate mitral annular calcification.  4. The aortic valve is tricuspid. There is mild calcification of the aortic valve. Aortic valve regurgitation is not visualized. No aortic stenosis is present.  5. The inferior vena cava is dilated in size with <50% respiratory variability, suggesting right atrial pressure of 15 mmHg. FINDINGS  Left Ventricle: Left ventricular ejection fraction, by estimation, is 20 to 25%. The left ventricle has severely decreased function. The left ventricle demonstrates regional wall motion abnormalities. The left ventricular internal cavity size was normal  in size. There is mild left ventricular hypertrophy. Left ventricular diastolic parameters are consistent with Grade II diastolic  dysfunction (pseudonormalization). Right Ventricle: The right ventricular size is normal. No increase in right ventricular wall thickness. Right ventricular systolic function is mildly reduced. There is mildly elevated pulmonary artery systolic pressure. The tricuspid regurgitant velocity  is 2.54 m/s, and with an assumed right atrial pressure of 15 mmHg, the estimated right ventricular systolic pressure is 34.1 mmHg. Left Atrium: Left atrial size was normal in size. Right Atrium: Right atrial size was normal in size. Pericardium: There is no evidence of pericardial effusion. Mitral Valve: The mitral valve is degenerative in appearance. There is moderate calcification of the mitral valve leaflet(s). Moderate mitral annular calcification. Trivial mitral valve regurgitation. No evidence of mitral valve stenosis. Tricuspid Valve: The tricuspid valve is normal in structure. Tricuspid valve regurgitation is trivial. Aortic Valve: The aortic valve is tricuspid. There is mild calcification of the aortic valve. Aortic valve regurgitation is not visualized. No aortic stenosis is present. Aortic valve mean gradient measures 3.0 mmHg. Aortic valve peak gradient measures 4.8 mmHg. Aortic valve area, by VTI measures 2.16 cm. Pulmonic Valve: The pulmonic valve was normal in structure. Pulmonic valve regurgitation is trivial. Aorta: The aortic root is normal in size and structure. Venous: The inferior vena cava is dilated in size with less than 50% respiratory variability, suggesting right atrial pressure of 15 mmHg. IAS/Shunts: No atrial level shunt detected by color flow Doppler.  LEFT VENTRICLE PLAX 2D LVIDd:         5.00 cm     Diastology LVIDs:         3.60 cm     LV e' medial:    3.30 cm/s LV PW:         0.80 cm     LV E/e' medial:  30.3 LV IVS:        0.90 cm     LV e' lateral:   5.48 cm/s LVOT diam:     2.00 cm     LV E/e' lateral: 18.2 LV SV:         49 LV SV Index:   22 LVOT Area:     3.14 cm  LV Volumes (MOD) LV vol  d, MOD A2C: 83.6 ml LV vol d, MOD A4C: 75.4 ml LV vol s, MOD A2C: 56.0 ml LV vol s, MOD A4C: 65.2 ml LV SV MOD A2C:     27.6 ml LV SV MOD A4C:     75.4 ml LV SV MOD BP:      18.2 ml RIGHT VENTRICLE            IVC RV Basal diam:  3.80 cm    IVC diam: 1.90 cm RV Mid  diam:    3.10 cm RV S prime:     9.45 cm/s TAPSE (M-mode): 2.0 cm LEFT ATRIUM             Index        RIGHT ATRIUM           Index LA diam:        4.50 cm 2.05 cm/m   RA Area:     11.50 cm LA Vol (A2C):   55.1 ml 25.14 ml/m  RA Volume:   25.00 ml  11.40 ml/m LA Vol (A4C):   58.6 ml 26.73 ml/m LA Biplane Vol: 57.8 ml 26.37 ml/m  AORTIC VALVE                    PULMONIC VALVE AV Area (Vmax):    2.11 cm     PV Vmax:          0.81 m/s AV Area (Vmean):   2.04 cm     PV Peak grad:     2.7 mmHg AV Area (VTI):     2.16 cm     PR End Diast Vel: 10.63 msec AV Vmax:           110.00 cm/s AV Vmean:          82.300 cm/s AV VTI:            0.225 m AV Peak Grad:      4.8 mmHg AV Mean Grad:      3.0 mmHg LVOT Vmax:         73.80 cm/s LVOT Vmean:        53.500 cm/s LVOT VTI:          0.155 m LVOT/AV VTI ratio: 0.69  AORTA Ao Root diam: 2.70 cm Ao Asc diam:  2.70 cm MITRAL VALVE                TRICUSPID VALVE MV Area (PHT): 3.10 cm     TR Peak grad:   25.8 mmHg MV Decel Time: 245 msec     TR Vmax:        254.00 cm/s MR Peak grad: 76.7 mmHg MR Vmax:      438.00 cm/s   SHUNTS MV E velocity: 100.00 cm/s  Systemic VTI:  0.16 m MV A velocity: 102.00 cm/s  Systemic Diam: 2.00 cm MV E/A ratio:  0.98 Dalton McleanMD Electronically signed by Franki Monte Signature Date/Time: 01/09/2022/3:08:46 PM    Final    VAS US DOPPLER PRE CABG  Result Date: 01/09/2022 PREOPERATIVE VASCULAR EVALUATION Patient Name:  BRELYNN WHELLER  Date of Exam:   01/09/2022 Medical Rec #: [id number]      Accession #:    [id number] Date of Birth: 23-Apr-1941      Patient Gender: F Patient Age:   89 years Exam Location:  [facility] Procedure:      VAS US DOPPLER PRE CABG Referring Phys: Collier Salina  VANTRIGT --------------------------------------------------------------------------------  Indications:      Pre-CABG. Risk Factors:     Hypertension. Comparison Study: No prior study Performing Technologist: Maudry Mayhew MHA, RDMS, RVT, RDCS  Examination Guidelines: A complete evaluation includes B-mode imaging, spectral Doppler, color Doppler, and power Doppler as needed of all accessible portions of each vessel. Bilateral testing is considered an integral part of a complete examination. Limited examinations for reoccurring indications may be performed as noted.  Right Carotid Findings: +----------+--------+-------+--------+----------------------+------------------+  PSV cm/sEDV    StenosisDescribe              Comments                             cm/s                                                    +----------+--------+-------+--------+----------------------+------------------+ CCA Prox  44      10                                                      +----------+--------+-------+--------+----------------------+------------------+ CCA Distal35      9                                    intimal thickening +----------+--------+-------+--------+----------------------+------------------+ ICA Prox  42      16             smooth and                                                                heterogenous                             +----------+--------+-------+--------+----------------------+------------------+ ICA Distal74      18                                                      +----------+--------+-------+--------+----------------------+------------------+ ECA       21      6              smooth and                                                                heterogenous                             +----------+--------+-------+--------+----------------------+------------------+  +----------+--------+-------+----------------+------------+           PSV cm/sEDV cmsDescribe        Arm Pressure +----------+--------+-------+----------------+------------+ [id number]             Multiphasic, WNL             +----------+--------+-------+----------------+------------+ +---------+--------+--+--------+--+---------+ VertebralPSV cm/s39EDV cm/s11Antegrade +---------+--------+--+--------+--+---------+ Left Carotid Findings: +----------+--------+--------+--------+-------------------------+---------+           PSV cm/sEDV cm/sStenosisDescribe  Comments  +----------+--------+--------+--------+-------------------------+---------+ CCA Prox  48      11                                                 +----------+--------+--------+--------+-------------------------+---------+ CCA Distal35      9               smooth and hyperechoic             +----------+--------+--------+--------+-------------------------+---------+ ICA Prox  27      10              heterogenous and calcificShadowing +----------+--------+--------+--------+-------------------------+---------+ ICA Distal45      12                                                 +----------+--------+--------+--------+-------------------------+---------+ ECA       47      6               heterogenous                       +----------+--------+--------+--------+-------------------------+---------+  +----------+--------+--------+----------------+------------+ SubclavianPSV cm/sEDV cm/sDescribe        Arm Pressure +----------+--------+--------+----------------+------------+           48              Multiphasic, WNL             +----------+--------+--------+----------------+------------+ +---------+--------+--+--------+--+---------+ VertebralPSV cm/s56EDV cm/s18Antegrade +---------+--------+--+--------+--+---------+  ABI Findings:  +--------+------------------+-----+---------+--------+ Right   Rt Pressure (mmHg)IndexWaveform Comment  +--------+------------------+-----+---------+--------+ Brachial                       triphasic         +--------+------------------+-----+---------+--------+ PTA                            triphasic         +--------+------------------+-----+---------+--------+ DP                             biphasic          +--------+------------------+-----+---------+--------+ +--------+------------------+-----+---------+-------+ Left    Lt Pressure (mmHg)IndexWaveform Comment +--------+------------------+-----+---------+-------+ Brachial                       triphasic        +--------+------------------+-----+---------+-------+ PTA                            triphasic        +--------+------------------+-----+---------+-------+ DP                             triphasic        +--------+------------------+-----+---------+-------+  Right Doppler Findings: +-----------+--------+-----+---------+-----------------------------------------+ Site       PressureIndexDoppler  Comments                                  +-----------+--------+-----+---------+-----------------------------------------+ Brachial  triphasic                                          +-----------+--------+-----+---------+-----------------------------------------+ Radial                  triphasic                                          +-----------+--------+-----+---------+-----------------------------------------+ Ulnar                   triphasic                                          +-----------+--------+-----+---------+-----------------------------------------+ Palmar Arch                      Signal is unaffected with radial                                           compression, obliterates with ulnar                                        compression                                +-----------+--------+-----+---------+-----------------------------------------+  Left Doppler Findings: +-----------+--------+-----+---------+-----------------------------------------+ Site       PressureIndexDoppler  Comments                                  +-----------+--------+-----+---------+-----------------------------------------+ Brachial                triphasic                                          +-----------+--------+-----+---------+-----------------------------------------+ Radial                  triphasic                                          +-----------+--------+-----+---------+-----------------------------------------+ Ulnar                   triphasic                                          +-----------+--------+-----+---------+-----------------------------------------+ Palmar Arch                      Signal is unaffected with radial  compression, decreaes 50% with ulnar                                       compression                               +-----------+--------+-----+---------+-----------------------------------------+  Summary: Right Carotid: Velocities in the right ICA are consistent with a 1-39% stenosis. Left Carotid: Velocities in the left ICA are consistent with a 1-39% stenosis. Vertebrals:  Bilateral vertebral arteries demonstrate antegrade flow. Subclavians: Normal flow hemodynamics were seen in bilateral subclavian              arteries. ABI: Bilateral pedal waveforms are within normal limits at rest. Right Upper Extremity: Doppler waveforms remain within normal limits with right radial compression. Doppler waveform obliterate with right ulnar compression. Left Upper Extremity: Doppler waveforms remain within normal limits with left radial compression. Doppler waveforms decrease 50% with left ulnar compression.  Electronically signed by Harold Barban MD on  01/09/2022 at 8:52:18 PM.    Final    [place] EKG SITE RITE  Result Date: 01/09/2022 If Site Rite image not attached, placement could not be confirmed due to current cardiac rhythm.   I independently reviewed the above imaging studies.  Procedure note: Under sterile conditions, a 14 French Foley catheter was placed to gravity drainage without difficulty after retracting her labia.  Impression/Recommendation: Difficult Foley catheter placement  -16 French Foley catheter was placed.  Void trial per primary team.  Please call with questions.  Matt R. Gay MD 01/10/2022, 6:46 PM  [facility]  Pager: [phone]   CC: Dahlia Byes, MD

## 2022-01-10 NOTE — Progress Notes (Signed)
Patient refused to allow this RN to place a foley catheter due to it being "too late". Patient educated on the importance of a foley catheter. Will attempt to reeducate and place a foley catheter in the morning.

## 2022-01-10 NOTE — Progress Notes (Signed)
Patient with auditory wheezing upon assessment. This RN offered a nebulizer treatment for wheezing. Patient refused and insisted on sleeping instead of receiving treatment. This RN educated patient on the importance of relieving the wheezing. Will continue to monitor.

## 2022-01-10 NOTE — Progress Notes (Signed)
Pharmacy Antibiotic Note  Brittany Jackson is a 81 y.o. female admitted on 01/08/2022 with pneumonia. Pharmacy has been consulted for cefepime dosing.  Chest X-ray shows persistent bilateral opacities. Serum creatinine is currently elevated at 1.91.  Plan: Start cefepime 2 g every 12 hours F/u cultures, renal function, clinical course  De-escalate as indicated   Height: '5\' 6"'$  (167.6 cm) Weight: 110 kg (242 lb 8.1 oz) IBW/kg (Calculated) : 59.3  Temp (24hrs), Avg:98.1 F (36.7 C), Min:97.4 F (36.3 C), Max:98.5 F (36.9 C)  Recent Labs  Lab 01/08/22 2356 01/09/22 0133 01/09/22 0314 01/09/22 1618 01/09/22 2101 01/09/22 2225 01/10/22 0309  WBC 22.8*  --  21.5*  --   --   --  20.5*  CREATININE 1.11* 1.10* 1.33* 1.65* 1.65*  --  1.91*  LATICACIDVEN  --   --   --   --   --  1.1  --     Estimated Creatinine Clearance: 28 mL/min (A) (by C-G formula based on SCr of 1.91 mg/dL (H)).    Allergies  Allergen Reactions   Codeine Other (See Comments)    Unknown   Meprobamate Swelling   Sulfa Antibiotics Other (See Comments)    Unknown    Antimicrobials this admission: Cefepime 5/25 >>   Dose adjustments this admission: N/A  Microbiology results: 5/25 Sputum: pending  5/24 and 5/25 MRSA PCR: negative  Thank you for allowing pharmacy to be a part of this patient's care.  Zenaida Deed, PharmD PGY1 Acute Care Pharmacy Resident  Phone: [phone] 01/10/2022  3:07 PM  Please check [email or web address] for unit-specific pharmacy phone numbers.

## 2022-01-10 NOTE — Progress Notes (Signed)
Lower extremity saphenous vein mapping has been completed. Preliminary results can be found in CV Proc through chart review.   01/10/22 1:13 PM Brittany Jackson RVT

## 2022-01-10 NOTE — Progress Notes (Signed)
2 Days Post-Op Procedure(s) (LRB): Coronary/Graft Acute MI Revascularization (N/A) RIGHT HEART CATH (N/A) Subjective: Generally improved but Troponin, creat still increasing CXR improved edema but infiltrate R base,  active smoker,WBC 21k will check cultures and cover with Maxepime Objective: Vital signs in last 24 hours: Temp:  [97.4 F (36.3 C)-98.5 F (36.9 C)] 98.5 F (36.9 C) (05/25 0800) Pulse Rate:  [69-88] 73 (05/25 0845) Cardiac Rhythm: Normal sinus rhythm (05/25 0800) Resp:  [16-33] 22 (05/25 0845) BP: (89-149)/(37-133) 96/40 (05/25 0400) SpO2:  [85 %-100 %] 96 % (05/25 0845) Arterial Line BP: (110-170)/(39-74) 115/39 (05/25 0845) FiO2 (%):  [40 %-60 %] 40 % (05/24 2157) Weight:  [110 kg] 110 kg (05/25 0545)  Hemodynamic parameters for last 24 hours: CVP:  [1 mmHg-60 mmHg] 2 mmHg  Intake/Output from previous day: 05/24 0701 - 05/25 0700 In: 936.9 [I.V.:936.9] Out: 1600 [Urine:1600] Intake/Output this shift: Total I/O In: 279 [P.O.:240; I.V.:39] Out: 360 [Urine:360]  Exam + pedal pulses NSR No MR murmur  Lab Results: Recent Labs    01/09/22 0314 01/10/22 0306 01/10/22 0309  WBC 21.5*  --  20.5*  HGB 13.7 12.6 12.1  HCT 41.4 37.0 35.9*  PLT 328  --  311   BMET:  Recent Labs    01/09/22 2101 01/10/22 0306 01/10/22 0309  NA 132* 130* 129*  K 4.8 4.6 4.6  CL 100  --  97*  CO2 22  --  22  GLUCOSE 129*  --  122*  BUN 21  --  23  CREATININE 1.65*  --  1.91*  CALCIUM 9.0  --  8.6*    PT/INR:  Recent Labs    01/09/22 0017  LABPROT 12.1  INR 0.9   ABG    Component Value Date/Time   PHART 7.404 01/10/2022 0306   HCO3 24.4 01/10/2022 0306   TCO2 26 01/10/2022 0306   ACIDBASEDEF 5.0 (H) 01/09/2022 0201   O2SAT 69 01/10/2022 0309   CBG (last 3)  No results for input(s): GLUCAP in the last 72 hours.  Assessment/Plan: S/P Procedure(s) (LRB): Coronary/Graft Acute MI Revascularization (N/A) RIGHT HEART CATH (N/A) Continue supportive  care Does not meet criteria for CABG/MVR currently but some improvement since yesterday Check vein mapping - hx of Vein stripping  LOS: 1 day    Brittany Jackson 01/10/2022

## 2022-01-10 NOTE — Progress Notes (Signed)
Patient ID: Brittany Jackson, female   DOB: 1941/06/26, 81 y.o.   MRN: [id number]     Advanced Heart Failure Rounding Note  PCP-Cardiologist: None   Subjective:    Breathing better today, on 2L [place]. Co-ox 69% on milrinone 0.25.  She is on Lasix 12 mg/hr, CVP only 6-7 currently.    Creatinine 1.65 => 1.9  Objective:   Weight Range: 110 kg Body mass index is 39.14 kg/m.   Vital Signs:   Temp:  [97.4 F (36.3 C)-98.4 F (36.9 C)] 97.4 F (36.3 C) (05/25 0315) Pulse Rate:  [69-88] 80 (05/25 0745) Resp:  [16-33] 20 (05/25 0745) BP: (89-149)/(37-133) 96/40 (05/25 0400) SpO2:  [85 %-100 %] 97 % (05/25 0745) Arterial Line BP: (121-170)/(42-74) 133/46 (05/25 0745) FiO2 (%):  [40 %-60 %] 40 % (05/24 2157) Weight:  [110 kg] 110 kg (05/25 0545)    Weight change: Filed Weights   01/08/22 2344 01/10/22 0545  Weight: 112.6 kg 110 kg    Intake/Output:   Intake/Output Summary (Last 24 hours) at 01/10/2022 0753 Last data filed at 01/10/2022 0750 Gross per 24 hour  Intake 936.89 ml  Output 1960 ml  Net -1023.11 ml      Physical Exam    General:  Well appearing. No resp difficulty HEENT: Normal Neck: Supple. JVP not elevated. Carotids 2+ bilat; no bruits. No lymphadenopathy or thyromegaly appreciated. Cor: PMI nondisplaced. Regular rate & rhythm. No rubs, gallops or murmurs. Lungs: Clear Abdomen: Soft, nontender, nondistended. No hepatosplenomegaly. No bruits or masses. Good bowel sounds. Extremities: No cyanosis, clubbing, rash, edema Neuro: Alert & orientedx3, cranial nerves grossly intact. moves all 4 extremities w/o difficulty. Affect pleasant   Telemetry   NSR 90s (personally reviewed)  Labs    CBC Recent Labs    01/08/22 2356 01/09/22 0133 01/09/22 0314 01/10/22 0306 01/10/22 0309  WBC 22.8*  --  21.5*  --  20.5*  NEUTROABS 15.8*  --   --   --   --   HGB 14.9   < > 13.7 12.6 12.1  HCT 45.8   < > 41.4 37.0 35.9*  MCV 94.6  --  92.2  --  89.8  PLT 390  --  328   --  311   < > = values in this interval not displayed.   Basic Metabolic Panel Recent Labs    01/09/22 2101 01/10/22 0306 01/10/22 0309  NA 132* 130* 129*  K 4.8 4.6 4.6  CL 100  --  97*  CO2 22  --  22  GLUCOSE 129*  --  122*  BUN 21  --  23  CREATININE 1.65*  --  1.91*  CALCIUM 9.0  --  8.6*   Liver Function Tests Recent Labs    01/08/22 2356 01/10/22 0309  AST 32 34  ALT 23 18  ALKPHOS 111 79  BILITOT 0.8 0.4  PROT 7.8 6.0*  ALBUMIN 4.3 3.2*   No results for input(s): LIPASE, AMYLASE in the last 72 hours. Cardiac Enzymes No results for input(s): CKTOTAL, CKMB, CKMBINDEX, TROPONINI in the last 72 hours.  BNP: BNP (last 3 results) Recent Labs    01/08/22 2356  BNP 227.4*    ProBNP (last 3 results) No results for input(s): PROBNP in the last 8760 hours.   D-Dimer No results for input(s): DDIMER in the last 72 hours. Hemoglobin A1C Recent Labs    01/09/22 0322  HGBA1C 5.7*   Fasting Lipid Panel Recent Labs  01/09/22 0017  CHOL 268*  HDL 74  LDLCALC 164*  TRIG 148  CHOLHDL 3.6   Thyroid Function Tests Recent Labs    01/09/22 0322  TSH 1.906    Other results:   Imaging    ECHOCARDIOGRAM COMPLETE  Result Date: 01/09/2022    ECHOCARDIOGRAM REPORT   Patient Name:   Brittany Jackson Date of Exam: 01/09/2022 Medical Rec #:  [id number]     Height:       66.0 in Accession #:    [id number]    Weight:       248.2 lb Date of Birth:  [birth year]     BSA:          2.192 m Patient Age:    33 years      BP:           128/75 mmHg Patient Gender: F             HR:           68 bpm. Exam Location:  Inpatient Procedure: 2D Echo, Cardiac Doppler and Color Doppler Indications:    Myocardial infarction  History:        Patient has prior history of Echocardiogram examinations, most                 recent 04/09/2012. CHF; Risk Factors:Hypertension.  Sonographer:    Joette Catching RCS Referring Phys: [place]  1. Left ventricular ejection  fraction, by estimation, is 20 to 25%. The left ventricle has severely decreased function. The left ventricle demonstrates regional wall motion abnormalities with mid to apical anterior, anteroseptal, inferoseptal, and inferior akinesis, akinesis of the apical lateral wall, and akinesis of the true apex. No LV thrombus visualized. There is mild left ventricular hypertrophy. Left ventricular diastolic parameters are consistent with Grade II diastolic dysfunction (pseudonormalization).  2. Right ventricular systolic function is mildly reduced. The right ventricular size is normal. There is mildly elevated pulmonary artery systolic pressure. The estimated right ventricular systolic pressure is 67.8 mmHg.  3. The mitral valve is degenerative. Trivial mitral valve regurgitation. No evidence of mitral stenosis. Moderate mitral annular calcification.  4. The aortic valve is tricuspid. There is mild calcification of the aortic valve. Aortic valve regurgitation is not visualized. No aortic stenosis is present.  5. The inferior vena cava is dilated in size with <50% respiratory variability, suggesting right atrial pressure of 15 mmHg. FINDINGS  Left Ventricle: Left ventricular ejection fraction, by estimation, is 20 to 25%. The left ventricle has severely decreased function. The left ventricle demonstrates regional wall motion abnormalities. The left ventricular internal cavity size was normal  in size. There is mild left ventricular hypertrophy. Left ventricular diastolic parameters are consistent with Grade II diastolic dysfunction (pseudonormalization). Right Ventricle: The right ventricular size is normal. No increase in right ventricular wall thickness. Right ventricular systolic function is mildly reduced. There is mildly elevated pulmonary artery systolic pressure. The tricuspid regurgitant velocity  is 2.54 m/s, and with an assumed right atrial pressure of 15 mmHg, the estimated right ventricular systolic pressure is  93.8 mmHg. Left Atrium: Left atrial size was normal in size. Right Atrium: Right atrial size was normal in size. Pericardium: There is no evidence of pericardial effusion. Mitral Valve: The mitral valve is degenerative in appearance. There is moderate calcification of the mitral valve leaflet(s). Moderate mitral annular calcification. Trivial mitral valve regurgitation. No evidence of mitral valve stenosis. Tricuspid Valve: The tricuspid valve is normal  in structure. Tricuspid valve regurgitation is trivial. Aortic Valve: The aortic valve is tricuspid. There is mild calcification of the aortic valve. Aortic valve regurgitation is not visualized. No aortic stenosis is present. Aortic valve mean gradient measures 3.0 mmHg. Aortic valve peak gradient measures 4.8 mmHg. Aortic valve area, by VTI measures 2.16 cm. Pulmonic Valve: The pulmonic valve was normal in structure. Pulmonic valve regurgitation is trivial. Aorta: The aortic root is normal in size and structure. Venous: The inferior vena cava is dilated in size with less than 50% respiratory variability, suggesting right atrial pressure of 15 mmHg. IAS/Shunts: No atrial level shunt detected by color flow Doppler.  LEFT VENTRICLE PLAX 2D LVIDd:         5.00 cm     Diastology LVIDs:         3.60 cm     LV e' medial:    3.30 cm/s LV PW:         0.80 cm     LV E/e' medial:  30.3 LV IVS:        0.90 cm     LV e' lateral:   5.48 cm/s LVOT diam:     2.00 cm     LV E/e' lateral: 18.2 LV SV:         49 LV SV Index:   22 LVOT Area:     3.14 cm  LV Volumes (MOD) LV vol d, MOD A2C: 83.6 ml LV vol d, MOD A4C: 75.4 ml LV vol s, MOD A2C: 56.0 ml LV vol s, MOD A4C: 65.2 ml LV SV MOD A2C:     27.6 ml LV SV MOD A4C:     75.4 ml LV SV MOD BP:      18.2 ml RIGHT VENTRICLE            IVC RV Basal diam:  3.80 cm    IVC diam: 1.90 cm RV Mid diam:    3.10 cm RV S prime:     9.45 cm/s TAPSE (M-mode): 2.0 cm LEFT ATRIUM             Index        RIGHT ATRIUM           Index LA diam:         4.50 cm 2.05 cm/m   RA Area:     11.50 cm LA Vol (A2C):   55.1 ml 25.14 ml/m  RA Volume:   25.00 ml  11.40 ml/m LA Vol (A4C):   58.6 ml 26.73 ml/m LA Biplane Vol: 57.8 ml 26.37 ml/m  AORTIC VALVE                    PULMONIC VALVE AV Area (Vmax):    2.11 cm     PV Vmax:          0.81 m/s AV Area (Vmean):   2.04 cm     PV Peak grad:     2.7 mmHg AV Area (VTI):     2.16 cm     PR End Diast Vel: 10.63 msec AV Vmax:           110.00 cm/s AV Vmean:          82.300 cm/s AV VTI:            0.225 m AV Peak Grad:      4.8 mmHg AV Mean Grad:      3.0 mmHg LVOT Vmax:  73.80 cm/s LVOT Vmean:        53.500 cm/s LVOT VTI:          0.155 m LVOT/AV VTI ratio: 0.69  AORTA Ao Root diam: 2.70 cm Ao Asc diam:  2.70 cm MITRAL VALVE                TRICUSPID VALVE MV Area (PHT): 3.10 cm     TR Peak grad:   25.8 mmHg MV Decel Time: 245 msec     TR Vmax:        254.00 cm/s MR Peak grad: 76.7 mmHg MR Vmax:      438.00 cm/s   SHUNTS MV E velocity: 100.00 cm/s  Systemic VTI:  0.16 m MV A velocity: 102.00 cm/s  Systemic Diam: 2.00 cm MV E/A ratio:  0.98 Cherisa Brucker McleanMD Electronically signed by Franki Monte Signature Date/Time: 01/09/2022/3:08:46 PM    Final    VAS US DOPPLER PRE CABG  Result Date: 01/09/2022 PREOPERATIVE VASCULAR EVALUATION Patient Name:  EVERLEIGH COLCLASURE  Date of Exam:   01/09/2022 Medical Rec #: [id number]      Accession #:    [id number] Date of Birth: 1941/01/31      Patient Gender: F Patient Age:   35 years Exam Location:  [facility] Procedure:      VAS US DOPPLER PRE CABG Referring Phys: Collier Salina VANTRIGT --------------------------------------------------------------------------------  Indications:      Pre-CABG. Risk Factors:     Hypertension. Comparison Study: No prior study Performing Technologist: Maudry Mayhew MHA, RDMS, RVT, RDCS  Examination Guidelines: A complete evaluation includes B-mode imaging, spectral Doppler, color Doppler, and power Doppler as needed of all accessible portions of  each vessel. Bilateral testing is considered an integral part of a complete examination. Limited examinations for reoccurring indications may be performed as noted.  Right Carotid Findings: +----------+--------+-------+--------+----------------------+------------------+           PSV cm/sEDV    StenosisDescribe              Comments                             cm/s                                                    +----------+--------+-------+--------+----------------------+------------------+ CCA Prox  44      10                                                      +----------+--------+-------+--------+----------------------+------------------+ CCA Distal35      9                                    intimal thickening +----------+--------+-------+--------+----------------------+------------------+ ICA Prox  42      16             smooth and  heterogenous                             +----------+--------+-------+--------+----------------------+------------------+ ICA Distal74      18                                                      +----------+--------+-------+--------+----------------------+------------------+ ECA       21      6              smooth and                                                                heterogenous                             +----------+--------+-------+--------+----------------------+------------------+ +----------+--------+-------+----------------+------------+           PSV cm/sEDV cmsDescribe        Arm Pressure +----------+--------+-------+----------------+------------+ [id number]             Multiphasic, WNL             +----------+--------+-------+----------------+------------+ +---------+--------+--+--------+--+---------+ VertebralPSV cm/s39EDV cm/s11Antegrade +---------+--------+--+--------+--+---------+ Left Carotid Findings:  +----------+--------+--------+--------+-------------------------+---------+           PSV cm/sEDV cm/sStenosisDescribe                 Comments  +----------+--------+--------+--------+-------------------------+---------+ CCA Prox  48      11                                                 +----------+--------+--------+--------+-------------------------+---------+ CCA Distal35      9               smooth and hyperechoic             +----------+--------+--------+--------+-------------------------+---------+ ICA Prox  27      10              heterogenous and calcificShadowing +----------+--------+--------+--------+-------------------------+---------+ ICA Distal45      12                                                 +----------+--------+--------+--------+-------------------------+---------+ ECA       47      6               heterogenous                       +----------+--------+--------+--------+-------------------------+---------+  +----------+--------+--------+----------------+------------+ SubclavianPSV cm/sEDV cm/sDescribe        Arm Pressure +----------+--------+--------+----------------+------------+           48              Multiphasic, WNL             +----------+--------+--------+----------------+------------+ +---------+--------+--+--------+--+---------+ VertebralPSV cm/s56EDV cm/s18Antegrade +---------+--------+--+--------+--+---------+  ABI Findings: +--------+------------------+-----+---------+--------+ Right   Rt Pressure (mmHg)IndexWaveform Comment  +--------+------------------+-----+---------+--------+ Brachial                       triphasic         +--------+------------------+-----+---------+--------+ PTA                            triphasic         +--------+------------------+-----+---------+--------+ DP                             biphasic          +--------+------------------+-----+---------+--------+  +--------+------------------+-----+---------+-------+ Left    Lt Pressure (mmHg)IndexWaveform Comment +--------+------------------+-----+---------+-------+ Brachial                       triphasic        +--------+------------------+-----+---------+-------+ PTA                            triphasic        +--------+------------------+-----+---------+-------+ DP                             triphasic        +--------+------------------+-----+---------+-------+  Right Doppler Findings: +-----------+--------+-----+---------+-----------------------------------------+ Site       PressureIndexDoppler  Comments                                  +-----------+--------+-----+---------+-----------------------------------------+ Brachial                triphasic                                          +-----------+--------+-----+---------+-----------------------------------------+ Radial                  triphasic                                          +-----------+--------+-----+---------+-----------------------------------------+ Ulnar                   triphasic                                          +-----------+--------+-----+---------+-----------------------------------------+ Palmar Arch                      Signal is unaffected with radial                                           compression, obliterates with ulnar                                        compression                               +-----------+--------+-----+---------+-----------------------------------------+  Left Doppler Findings: +-----------+--------+-----+---------+-----------------------------------------+ Site       PressureIndexDoppler  Comments                                  +-----------+--------+-----+---------+-----------------------------------------+ Brachial                triphasic                                           +-----------+--------+-----+---------+-----------------------------------------+ Radial                  triphasic                                          +-----------+--------+-----+---------+-----------------------------------------+ Ulnar                   triphasic                                          +-----------+--------+-----+---------+-----------------------------------------+ Palmar Arch                      Signal is unaffected with radial                                           compression, decreaes 50% with ulnar                                       compression                               +-----------+--------+-----+---------+-----------------------------------------+  Summary: Right Carotid: Velocities in the right ICA are consistent with a 1-39% stenosis. Left Carotid: Velocities in the left ICA are consistent with a 1-39% stenosis. Vertebrals:  Bilateral vertebral arteries demonstrate antegrade flow. Subclavians: Normal flow hemodynamics were seen in bilateral subclavian              arteries. ABI: Bilateral pedal waveforms are within normal limits at rest. Right Upper Extremity: Doppler waveforms remain within normal limits with right radial compression. Doppler waveform obliterate with right ulnar compression. Left Upper Extremity: Doppler waveforms remain within normal limits with left radial compression. Doppler waveforms decrease 50% with left ulnar compression.  Electronically signed by Harold Barban MD on 01/09/2022 at 8:52:18 PM.    Final    [place] EKG SITE RITE  Result Date: 01/09/2022 If Site Rite image not attached, placement could not be confirmed due to current cardiac rhythm.    Medications:     Scheduled Medications:  aspirin  81 mg Oral Daily   atorvastatin  80 mg Oral Daily   Chlorhexidine Gluconate Cloth  6 each Topical Daily   famotidine  20 mg Oral BID   montelukast  10 mg Oral QHS   nicotine  21 mg Transdermal Daily   sodium  chloride flush  10-40 mL Intracatheter Q12H  sodium chloride flush  3 mL Intravenous Q12H    Infusions:  sodium chloride Stopped (01/09/22 0046)   sodium chloride     sodium chloride     albuterol Stopped (01/09/22 0012)   amiodarone 30 mg/hr (01/10/22 0700)   heparin 1,200 Units/hr (01/10/22 0700)   milrinone 0.25 mcg/kg/min (01/10/22 0749)    PRN Medications: sodium chloride, Place/Maintain arterial line **AND** sodium chloride, acetaminophen, albuterol, diazepam, ondansetron (ZOFRAN) IV, sodium chloride flush, sodium chloride flush, zolpidem   Assessment/Plan   1. CAD: Patient presented with anterolateral STEMI. Peak HS-TnI 2965. LHC with severe 3VD, 60% dLM, 90% ostial LAD, 75% stenosis at bifurcation of mLAD and D, 75% stenosis at bifurcation of mLCx and OM.  Patient would be best served by CABG if stable enough for it.  No further chest pain.  - ASA 81 - heparin gtt - atorvastatin 80 daily - Will have her seen by [facility] (Dr Cyndia Bent operated on her husband).  If not CABG candidate, will need high risk PCI.  2. Acute systolic CHF: Ischemic cardiomyopathy.  Low output on initial RHC with CI 1.8.  Filling pressures significantly elevated on RHC as well.  Echo with EF 20-25%, mildly decreased RV systolic function, trivial MR, dilated IVC. She has been on Lasix gtt overnight, creatinine higher at 1.9 but co-ox 69%.  CVP down to 6-7 this morning.  - With lower CVP and rising creatinine, hold Lasix gtt today.  Will give Lasix 60 mg IV x 1 this evening.  - Continue milrinone 0.25 mcg/kg/min.  - If she develops further ischemic-type chest pain or does not respond well to milrinone, may need IABP placement.  - CXR 3.  Atrial fibrillation: Paroxysmal, noted initially this hospitalization.  She is in NSR now.  - Continue heparin gtt.  - Continue amiodarone gtt 30 mg/hr.  4.  AKI: Creatinine up to 1.65 => 1.9. Suspect cardiorenal with low output.  - Hopefully, this will improve with  milrinone.  - Hold Lasix as above.  5. ID: WBCs 20, no fever.  Suspect reaction to MI.   CRITICAL CARE Performed by: Loralie Champagne  Total critical care time: 35 minutes  Critical care time was exclusive of separately billable procedures and treating other patients.  Critical care was necessary to treat or prevent imminent or life-threatening deterioration.  Critical care was time spent personally by me on the following activities: development of treatment plan with patient and/or surrogate as well as nursing, discussions with consultants, evaluation of patient's response to treatment, examination of patient, obtaining history from patient or surrogate, ordering and performing treatments and interventions, ordering and review of laboratory studies, ordering and review of radiographic studies, pulse oximetry and re-evaluation of patient's condition.   Length of Stay: 1  Loralie Champagne, MD  01/10/2022, 7:53 AM  Advanced Heart Failure Team Pager [phone] (M-F; 7a - 5p)  Please contact [facility] for night-coverage after hours (5p -7a ) and weekends on [email or web address]

## 2022-01-10 NOTE — Progress Notes (Signed)
Patient has not voided in three hours. When this RN suggested to do a bladder scan to check for urine in bladder, patient refused. Will attempt to bladder scan at a later time.

## 2022-01-10 NOTE — Progress Notes (Signed)
ANTICOAGULATION CONSULT NOTE - Follow Up Consult  Pharmacy Consult for heparin Indication: chest pain/ACS and atrial fibrillation  Allergies  Allergen Reactions   Codeine Other (See Comments)    Unknown   Meprobamate Swelling   Sulfa Antibiotics Other (See Comments)    Unknown    Patient Measurements: Height: '5\' 6"'$  (167.6 cm) Weight: 110 kg (242 lb 8.1 oz) IBW/kg (Calculated) : 59.3 Heparin Dosing Weight: 85.7 kg  Vital Signs: Temp: 97.4 F (36.3 C) (05/25 0315) Temp Source: Axillary (05/25 0315) BP: 96/40 (05/25 0400) Pulse Rate: 77 (05/25 0715)  Labs: Recent Labs    01/08/22 2356 01/09/22 0017 01/09/22 0133 01/09/22 0314 01/09/22 0322 01/09/22 0534 01/09/22 1420 01/09/22 1618 01/09/22 2101 01/10/22 0306 01/10/22 0309  HGB 14.9  --    < > 13.7  --   --   --   --   --  12.6 12.1  HCT 45.8  --    < > 41.4  --   --   --   --   --  37.0 35.9*  PLT 390  --   --  328  --   --   --   --   --   --  311  APTT  --  29  --   --   --   --   --   --   --   --   --   LABPROT  --  12.1  --   --   --   --   --   --   --   --   --   INR  --  0.9  --   --   --   --   --   --   --   --   --   HEPARINUNFRC  --   --   --   --   --   --  0.44  --  0.40  --  0.38  CREATININE 1.11*  --    < > 1.33*  --   --   --  1.65* 1.65*  --  1.91*  TROPONINIHS 1,967*  --   --   --  2,674* 2,965*  --   --  3,557*  --   --    < > = values in this interval not displayed.     Estimated Creatinine Clearance: 28 mL/min (A) (by C-G formula based on SCr of 1.91 mg/dL (H)).  Assessment: 80 y.o. female admitted with Afib and STEMI s/p cath and awaiting CABG versus high-risk PCI. Patient is not on any anticoagulants PTA. Pharmacy has been consulted for heparin.  Most recent heparin level continues to be stable at 0.38. CBC is stable and within normal limits. No issues with bleeding or infusion noted per RN.  Goal of Therapy:  Heparin level 0.3-0.7 units/ml Monitor platelets by anticoagulation  protocol: Yes   Plan:  Heparin 1200 units/hr Daily heparin level and CBC Monitor for s/sx of bleeding  Thank you for including pharmacy in the care of this patient.  Zenaida Deed, PharmD PGY1 Acute Care Pharmacy Resident  Phone: [phone] 01/10/2022  7:22 AM  Please check [email or web address] for unit-specific pharmacy phone numbers.

## 2022-01-11 ENCOUNTER — Inpatient Hospital Stay (HOSPITAL_COMMUNITY): Payer: Medicare Other

## 2022-01-11 DIAGNOSIS — I340 Nonrheumatic mitral (valve) insufficiency: Secondary | ICD-10-CM | POA: Diagnosis not present

## 2022-01-11 DIAGNOSIS — I5041 Acute combined systolic (congestive) and diastolic (congestive) heart failure: Secondary | ICD-10-CM | POA: Diagnosis not present

## 2022-01-11 DIAGNOSIS — I2510 Atherosclerotic heart disease of native coronary artery without angina pectoris: Secondary | ICD-10-CM | POA: Diagnosis not present

## 2022-01-11 LAB — CBC
HCT: 33 % — ABNORMAL LOW (ref 36.0–46.0)
Hemoglobin: 10.8 g/dL — ABNORMAL LOW (ref 12.0–15.0)
MCH: 29.8 pg (ref 26.0–34.0)
MCHC: 32.7 g/dL (ref 30.0–36.0)
MCV: 91.2 fL (ref 80.0–100.0)
Platelets: 240 10*3/uL (ref 150–400)
RBC: 3.62 MIL/uL — ABNORMAL LOW (ref 3.87–5.11)
RDW: 13.4 % (ref 11.5–15.5)
WBC: 12.4 10*3/uL — ABNORMAL HIGH (ref 4.0–10.5)
nRBC: 0 % (ref 0.0–0.2)

## 2022-01-11 LAB — COMPREHENSIVE METABOLIC PANEL
ALT: 15 U/L (ref 0–44)
AST: 23 U/L (ref 15–41)
Albumin: 2.9 g/dL — ABNORMAL LOW (ref 3.5–5.0)
Alkaline Phosphatase: 67 U/L (ref 38–126)
Anion gap: 12 (ref 5–15)
BUN: 34 mg/dL — ABNORMAL HIGH (ref 8–23)
CO2: 22 mmol/L (ref 22–32)
Calcium: 8.1 mg/dL — ABNORMAL LOW (ref 8.9–10.3)
Chloride: 92 mmol/L — ABNORMAL LOW (ref 98–111)
Creatinine, Ser: 2.42 mg/dL — ABNORMAL HIGH (ref 0.44–1.00)
GFR, Estimated: 19 mL/min — ABNORMAL LOW (ref >60)
Glucose, Bld: 118 mg/dL — ABNORMAL HIGH (ref 70–99)
Potassium: 4.1 mmol/L (ref 3.5–5.1)
Sodium: 126 mmol/L — ABNORMAL LOW (ref 135–145)
Total Bilirubin: 0.8 mg/dL (ref 0.3–1.2)
Total Protein: 5.4 g/dL — ABNORMAL LOW (ref 6.5–8.1)

## 2022-01-11 LAB — COOXEMETRY PANEL
Carboxyhemoglobin: 1.4 % (ref 0.5–1.5)
Methemoglobin: <0.7 % (ref 0.0–1.5)
O2 Saturation: 72.7 %
Total hemoglobin: 11.2 g/dL — ABNORMAL LOW (ref 12.0–16.0)

## 2022-01-11 LAB — MAGNESIUM: Magnesium: 2.5 mg/dL — ABNORMAL HIGH (ref 1.7–2.4)

## 2022-01-11 LAB — PULMONARY FUNCTION TEST
FEF 25-75 Pre: 0.54 L/sec
FEF2575-%Pred-Pre: 39 %
FEV1-%Pred-Pre: 53 %
FEV1-Pre: 1.09 L
FEV1FVC-%Pred-Pre: 88 %
FEV6-%Pred-Pre: 64 %
FEV6-Pre: 1.67 L
FEV6FVC-%Pred-Pre: 104 %
FVC-%Pred-Pre: 61 %
FVC-Pre: 1.68 L
Pre FEV1/FVC ratio: 65 %
Pre FEV6/FVC Ratio: 99 %

## 2022-01-11 LAB — URINE CULTURE: Culture: NO GROWTH

## 2022-01-11 LAB — HEPARIN LEVEL (UNFRACTIONATED)
Heparin Unfractionated: 0.25 IU/mL — ABNORMAL LOW (ref 0.30–0.70)
Heparin Unfractionated: 0.34 IU/mL (ref 0.30–0.70)

## 2022-01-11 LAB — GLUCOSE, CAPILLARY
Glucose-Capillary: 211 mg/dL — ABNORMAL HIGH (ref 70–99)
Glucose-Capillary: 110 mg/dL — ABNORMAL HIGH (ref 70–99)

## 2022-01-11 LAB — TROPONIN I (HIGH SENSITIVITY): Troponin I (High Sensitivity): 727 ng/L (ref <18)

## 2022-01-11 MED ORDER — POTASSIUM CHLORIDE 2 MEQ/ML IV SOLN
80.0000 meq | INTRAVENOUS | Status: DC
  Filled 2022-01-11: qty 40

## 2022-01-11 MED ORDER — MILRINONE LACTATE IN DEXTROSE 20-5 MG/100ML-% IV SOLN
0.3000 ug/kg/min | INTRAVENOUS | Status: DC
  Filled 2022-01-11 (×2): qty 100

## 2022-01-11 MED ORDER — INSULIN ASPART 100 UNIT/ML IJ SOLN: 0.0000 [IU] | Freq: Every day | INTRAMUSCULAR | Status: DC

## 2022-01-11 MED ORDER — PLASMA-LYTE A IV SOLN
INTRAVENOUS | Status: DC
  Filled 2022-01-11: qty 2.5

## 2022-01-11 MED ORDER — INSULIN REGULAR(HUMAN) IN NACL 100-0.9 UT/100ML-% IV SOLN
INTRAVENOUS | Status: DC
Start: 2022-01-15 — End: 2022-01-15
  Filled 2022-01-11: qty 100

## 2022-01-11 MED ORDER — TRANEXAMIC ACID (OHS) BOLUS VIA INFUSION
15.0000 mg/kg | INTRAVENOUS | Status: DC
  Filled 2022-01-11: qty 1605

## 2022-01-11 MED ORDER — VANCOMYCIN HCL 1500 MG/300ML IV SOLN
1500.0000 mg | INTRAVENOUS | Status: DC
  Administered 2022-01-13: 1500 mg via INTRAVENOUS
  Filled 2022-01-11: qty 300

## 2022-01-11 MED ORDER — DOCUSATE SODIUM 100 MG PO CAPS
100.0000 mg | ORAL_CAPSULE | Freq: Two times a day (BID) | ORAL | Status: AC
  Administered 2022-01-11 – 2022-01-17 (×13): 100 mg via ORAL
  Filled 2022-01-11 (×13): qty 1

## 2022-01-11 MED ORDER — INSULIN ASPART 100 UNIT/ML IJ SOLN
0.0000 [IU] | Freq: Three times a day (TID) | INTRAMUSCULAR | Status: DC
  Administered 2022-01-11: 5 [IU] via SUBCUTANEOUS
  Administered 2022-01-12: 2 [IU] via SUBCUTANEOUS
  Administered 2022-01-14 (×3): 3 [IU] via SUBCUTANEOUS
  Administered 2022-01-19: 2 [IU] via SUBCUTANEOUS
  Administered 2022-01-19 (×2): 3 [IU] via SUBCUTANEOUS
  Administered 2022-01-20: 2 [IU] via SUBCUTANEOUS

## 2022-01-11 MED ORDER — VANCOMYCIN HCL 1500 MG/300ML IV SOLN
1500.0000 mg | INTRAVENOUS | Status: DC
  Filled 2022-01-11: qty 300

## 2022-01-11 MED ORDER — CEFAZOLIN SODIUM-DEXTROSE 2-4 GM/100ML-% IV SOLN
2.0000 g | INTRAVENOUS | Status: DC
  Filled 2022-01-11: qty 100

## 2022-01-11 MED ORDER — VANCOMYCIN HCL 2000 MG/400ML IV SOLN
2000.0000 mg | Freq: Once | INTRAVENOUS | Status: AC
  Administered 2022-01-11: 2000 mg via INTRAVENOUS
  Filled 2022-01-11: qty 400

## 2022-01-11 MED ORDER — MANNITOL 20 % IV SOLN
INTRAVENOUS | Status: DC
  Filled 2022-01-11: qty 13

## 2022-01-11 MED ORDER — TRANEXAMIC ACID 1000 MG/10ML IV SOLN
1.5000 mg/kg/h | INTRAVENOUS | Status: DC
  Filled 2022-01-11: qty 25

## 2022-01-11 MED ORDER — VANCOMYCIN VARIABLE DOSE PER UNSTABLE RENAL FUNCTION (PHARMACIST DOSING): Status: DC

## 2022-01-11 MED ORDER — PHENYLEPHRINE HCL-NACL 20-0.9 MG/250ML-% IV SOLN
30.0000 ug/min | INTRAVENOUS | Status: DC
  Filled 2022-01-11: qty 250

## 2022-01-11 MED ORDER — SENNA 8.6 MG PO TABS
1.0000 | ORAL_TABLET | Freq: Every day | ORAL | Status: DC
Start: 2022-01-11 — End: 2022-01-16
  Administered 2022-01-11 – 2022-01-16 (×6): 8.6 mg via ORAL
  Filled 2022-01-11 (×6): qty 1

## 2022-01-11 MED ORDER — DEXMEDETOMIDINE HCL IN NACL 400 MCG/100ML IV SOLN
0.1000 ug/kg/h | INTRAVENOUS | Status: DC
  Filled 2022-01-11: qty 100

## 2022-01-11 MED ORDER — CEFAZOLIN SODIUM-DEXTROSE 2-4 GM/100ML-% IV SOLN
2.0000 g | INTRAVENOUS | Status: DC
Start: 2022-01-15 — End: 2022-01-15
  Filled 2022-01-11: qty 100

## 2022-01-11 MED ORDER — PNEUMOCOCCAL 20-VAL CONJ VACC 0.5 ML IM SUSY: 0.5000 mL | PREFILLED_SYRINGE | INTRAMUSCULAR | Status: DC | PRN

## 2022-01-11 MED ORDER — EPINEPHRINE HCL 5 MG/250ML IV SOLN IN NS
0.0000 ug/min | INTRAVENOUS | Status: DC
  Filled 2022-01-11: qty 250

## 2022-01-11 MED ORDER — TRANEXAMIC ACID (OHS) PUMP PRIME SOLUTION
2.0000 mg/kg | INTRAVENOUS | Status: DC
  Filled 2022-01-11: qty 2.14

## 2022-01-11 MED ORDER — POLYETHYLENE GLYCOL 3350 17 G PO PACK
17.0000 g | PACK | Freq: Every day | ORAL | Status: DC
Start: 2022-01-11 — End: 2022-01-22
  Administered 2022-01-11 – 2022-01-20 (×8): 17 g via ORAL
  Filled 2022-01-11 (×9): qty 1

## 2022-01-11 MED ORDER — FLEET ENEMA 7-19 GM/118ML RE ENEM
1.0000 | ENEMA | Freq: Once | RECTAL | Status: DC
  Filled 2022-01-11: qty 1

## 2022-01-11 MED ORDER — FAMOTIDINE 20 MG PO TABS
20.0000 mg | ORAL_TABLET | Freq: Every day | ORAL | Status: DC
  Administered 2022-01-12 – 2022-01-13 (×2): 20 mg via ORAL
  Filled 2022-01-11 (×2): qty 1

## 2022-01-11 MED ORDER — HEPARIN 30,000 UNITS/1000 ML (OHS) CELLSAVER SOLUTION
Status: DC
  Filled 2022-01-11: qty 1000

## 2022-01-11 MED ORDER — NOREPINEPHRINE 4 MG/250ML-% IV SOLN
0.0000 ug/min | INTRAVENOUS | Status: DC
  Filled 2022-01-11: qty 250

## 2022-01-11 MED ORDER — ADULT MULTIVITAMIN W/MINERALS CH
1.0000 | ORAL_TABLET | Freq: Every day | ORAL | Status: DC
  Administered 2022-01-11 – 2022-01-22 (×12): 1 via ORAL
  Filled 2022-01-11 (×12): qty 1

## 2022-01-11 MED ORDER — NITROGLYCERIN IN D5W 200-5 MCG/ML-% IV SOLN
2.0000 ug/min | INTRAVENOUS | Status: DC
  Filled 2022-01-11: qty 250

## 2022-01-11 NOTE — Progress Notes (Signed)
Initial Nutrition Assessment  DOCUMENTATION CODES:   Obesity unspecified  INTERVENTION:  - Liberalize diet from a heart healthy to a regular diet to provide widest variety of menu options to enhance nutritional adequacy  - Continue Ensure Enlive po TID, each supplement provides 350 kcal and 20 grams of protein.  - MVI with minerals daily  NUTRITION DIAGNOSIS:   Increased nutrient needs related to acute illness as evidenced by estimated needs.  GOAL:   Patient will meet greater than or equal to 90% of their needs  MONITOR:   PO intake, Supplement acceptance, Diet advancement, Labs, Weight trends, I & O's  REASON FOR ASSESSMENT:   Consult Assessment of nutrition requirement/status, Poor PO  ASSESSMENT:   Pt admitted with SOB secondary to STEMI. PMH significant for CHF, HTN, tobacco abuse and urticaria.  05/24: s/p R/LHC with coronary angiography  Pt noted to have pulmonary edema requiring intermittent BiPAP. Per Cardiology, does not currently meet criteria for CABG/mitral valve replacement. Needs myocardial recovery and improvement in pulmonary and renal status prior to surgical intervention. Plans for PCI if unable to undergo CABG.   Spoke with pt and family at bedside. Pt states that she has had difficulty with constipation however this has improved since bowel medications have been added. She reports poor intake for some time. Since her husband's passing in 2014, she does not cook much anymore and has more convenience foods throughout the day. During admission she reports continued poor intake and only eats a few bites of all foods. She reports missing butter and salt.   Meal completions:  05/25: 20%-breakfast, 25%-lunch, 50%-dinner 05/26: 80%-breakfast  Given pt's ongoing poor PO intake and increased nutritional needs, she would benefit from continuing nutrition supplements as well as a liberalized diet as not to restrict her meal options. Discussed with pt optimization  of nutritional intake for possible PCI versus CABG. Pt's daughter also noted to bring in soup and fresh strawberries for pt, to encourage intake.   Pt denied significant recent wt loss PTA and endorses minimal wt loss during admission d/t fluid status as she was previously on lasix.   Admit wt: 112.6 kg Current wt: 107 kg  Edema: non-pitting generalized edema  Medications: colace, pepcid, miralax, senna, thiamine  Labs: sodium 126, BUN 34, Cr 2.42, Mg 2.5   UOP: 2140m x24 hours + 1035mx12 hours  I/O's: -450.88m36mince admission  NUTRITION - FOCUSED PHYSICAL EXAM:  Flowsheet Row Most Recent Value  Orbital Region No depletion  Upper Arm Region No depletion  Thoracic and Lumbar Region No depletion  Buccal Region No depletion  Temple Region No depletion  Clavicle Bone Region No depletion  Clavicle and Acromion Bone Region No depletion  Scapular Bone Region No depletion  Dorsal Hand No depletion  Patellar Region No depletion  Anterior Thigh Region No depletion  Posterior Calf Region No depletion  Edema (RD Assessment) Mild  [generalized edema]  Hair Reviewed  Eyes Reviewed  Mouth Reviewed  Skin Reviewed  Nails Reviewed      Diet Order:   Diet Order             Diet regular Room service appropriate? Yes; Fluid consistency: Thin; Fluid restriction: 1500 mL Fluid  Diet effective now                  EDUCATION NEEDS:   Education needs have been addressed  Skin:  Skin Assessment: Reviewed RN Assessment  Last BM:  5/23  Height:   Ht  Readings from Last 1 Encounters:  01/08/22 '5\' 6"'$  (1.676 m)    Weight:   Wt Readings from Last 1 Encounters:  01/11/22 107 kg    Ideal Body Weight:  59.1 kg  BMI:  Body mass index is 38.07 kg/m.  Estimated Nutritional Needs:   Kcal:  1800-2000  Protein:  90-105g  Fluid:  >/=1.8L  Clayborne Dana, RDN, LDN Clinical Nutrition

## 2022-01-11 NOTE — Evaluation (Signed)
Physical Therapy Evaluation Patient Details Name: Brittany Jackson MRN: [id number] DOB: [birth year] Today's Date: 01/11/2022  History of Present Illness  Brittany Jackson is a 81 y.o. female admitted 5/24 for the evaluation of Shortness of breath.  Had anterolaterial STEMI.PNA and afib.  Pt work up for CABG next week.   PMHx of hypertension, tobacco abuse and urticaria  Clinical Impression  Pt admitted with above diagnosis. Pt was able to ambulate with +2 mod to min assist. Pt unsteady at times needing incr support.  Will most likely need AIR post surgery and daughter is supportive at home. Will follow acutely.  Pt currently with functional limitations due to the deficits listed below (see PT Problem List). Pt will benefit from skilled PT to increase their independence and safety with mobility to allow discharge to the venue listed below.          Recommendations for follow up therapy are one component of a multi-disciplinary discharge planning process, led by the attending physician.  Recommendations may be updated based on patient status, additional functional criteria and insurance authorization.  Follow Up Recommendations Acute inpatient rehab (3hours/day)    Assistance Recommended at Discharge Intermittent Supervision/Assistance  Patient can return home with the following  A little help with walking and/or transfers;A little help with bathing/dressing/bathroom;Help with stairs or ramp for entrance    Equipment Recommendations  (shower chair)  Recommendations for Other Services  Rehab consult    Functional Status Assessment Patient has had a recent decline in their functional status and demonstrates the ability to make significant improvements in function in a reasonable and predictable amount of time.     Precautions / Restrictions Precautions Precautions: Fall Restrictions Weight Bearing Restrictions: No      Mobility  Bed Mobility Overal bed mobility: Needs Assistance Bed  Mobility: Supine to Sit     Supine to sit: Min assist     General bed mobility comments: assist for LEs and pt pulled up on PT with hand to come to EOB.    Transfers Overall transfer level: Needs assistance Equipment used: 2 person hand held assist Transfers: Sit to/from Stand Sit to Stand: Min assist, Mod assist, +2 safety/equipment           General transfer comment: Pt needed min assist to power up from bed with HHA and mod assist to steady intiailly as pt slightly unsteady on feet intiially upon standing.    Ambulation/Gait Ambulation/Gait assistance: Min assist, Mod assist, +2 safety/equipment Gait Distance (Feet): 200 Feet (100 feet x 2) Assistive device: 2 person hand held assist Gait Pattern/deviations: Step-through pattern, Decreased stride length, Drifts right/left   Gait velocity interpretation: <1.31 ft/sec, indicative of household ambulator   General Gait Details: Pt refused to use RW but agreed to hold onto PT and daughters HHA. Pt unsteady initially needing incr support but as she progressed with ambulation, pt got steadier. Pt with definite need of bil Ue support for balance at present as she was unsure on her feet. Pt walked to the outdoor area on [place] and sat outside for about 15 min. Then ambulated back with same assist. VSS  Stairs            Wheelchair Mobility    Modified Rankin (Stroke Patients Only)       Balance Overall balance assessment: Needs assistance Sitting-balance support: No upper extremity supported, Feet supported Sitting balance-Leahy Scale: Fair     Standing balance support: Bilateral upper extremity supported, During functional activity Standing  balance-Leahy Scale: Poor Standing balance comment: reliies on UE support as well as external support                             Pertinent Vitals/Pain Pain Assessment Pain Assessment: No/denies pain    Home Living Family/patient expects to be discharged to::  Private residence Living Arrangements: Alone Available Help at Discharge: Family;Available PRN/intermittently Type of Home: House Home Access: Stairs to enter Entrance Stairs-Rails: Right;Left;Can reach both Entrance Stairs-Number of Steps: 4 Alternate Level Stairs-Number of Steps: 7 Home Layout: Multi-level;Laundry or work area in basement;Bed/bath [place]: Kasandra Knudsen - single point Additional Comments: Daughter did state that pt can come to her home intiially and she has no steps.    Prior Function               Mobility Comments: Longer distances difficult       Hand Dominance   Dominant Hand: Right    Extremity/Trunk Assessment   Upper Extremity Assessment Upper Extremity Assessment: Defer to OT evaluation    Lower Extremity Assessment Lower Extremity Assessment: Generalized weakness    Cervical / Trunk Assessment Cervical / Trunk Assessment: Normal  Communication   Communication: No difficulties  Cognition Arousal/Alertness: Awake/alert Behavior During Therapy: WFL for tasks assessed/performed Overall Cognitive Status: Within Functional Limits for tasks assessed                                          General Comments General comments (skin integrity, edema, etc.): 77 bpm, 99% 2L, 127/44    Exercises     Assessment/Plan    PT Assessment Patient needs continued PT services  PT Problem List Decreased activity tolerance;Decreased balance;Decreased mobility;Decreased knowledge of use of DME;Decreased safety awareness;Decreased knowledge of precautions;Cardiopulmonary status limiting activity       PT Treatment Interventions DME instruction;Gait training;Functional mobility training;Therapeutic activities;Therapeutic exercise;Balance training;Patient/family education    PT Goals (Current goals can be found in the Care Plan section)  Acute Rehab PT Goals Patient Stated Goal: to go home PT Goal Formulation: With patient Time  For Goal Achievement: 01/25/22 Potential to Achieve Goals: Good    Frequency Min 3X/week     Co-evaluation               AM-PAC PT "6 Clicks" Mobility  Outcome Measure Help needed turning from your back to your side while in a flat bed without using bedrails?: None Help needed moving from lying on your back to sitting on the side of a flat bed without using bedrails?: A Little Help needed moving to and from a bed to a chair (including a wheelchair)?: A Lot Help needed standing up from a chair using your arms (e.g., wheelchair or bedside chair)?: A Lot Help needed to walk in hospital room?: Total Help needed climbing 3-5 steps with a railing? : Total 6 Click Score: 13    End of Session Equipment Utilized During Treatment: Gait belt;Oxygen Activity Tolerance: Patient limited by fatigue Patient left: in chair;with call bell/phone within reach;with chair alarm set;with family/visitor present Nurse Communication: Mobility status PT Visit Diagnosis: Unsteadiness on feet (R26.81);Muscle weakness (generalized) (M62.81)    Time: 2297-9892 PT Time Calculation (min) (ACUTE ONLY): 50 min   Charges:   PT Evaluation $PT Eval Moderate Complexity: 1 Mod PT Treatments $Gait Training: 23-37 mins  Izyk Marty M,PT Acute Rehab Services [phone] (pager)   Alvira Philips 01/11/2022, 4:14 PM

## 2022-01-11 NOTE — Progress Notes (Signed)
ANTICOAGULATION CONSULT NOTE - Follow Up Consult  Pharmacy Consult for heparin Indication: chest pain/ACS and atrial fibrillation  Allergies  Allergen Reactions   Codeine Other (See Comments)    Unknown   Meprobamate Swelling   Sulfa Antibiotics Other (See Comments)    Unknown    Patient Measurements: Height: '5\' 6"'$  (167.6 cm) Weight: 109 kg (240 lb 4.8 oz) IBW/kg (Calculated) : 59.3 Heparin Dosing Weight: 85.7 kg  Vital Signs: Temp: 97.5 F (36.4 C) (05/26 0352) Temp Source: Axillary (05/26 0352) Pulse Rate: 70 (05/26 0400)  Labs: Recent Labs    01/09/22 0017 01/09/22 0133 01/09/22 0314 01/09/22 0322 01/09/22 0534 01/09/22 1420 01/09/22 2101 01/10/22 0306 01/10/22 0309 01/10/22 1701 01/11/22 0432 01/11/22 0433  HGB  --    < > 13.7  --   --   --   --  12.6 12.1  --   --  10.8*  HCT  --    < > 41.4  --   --   --   --  37.0 35.9*  --   --  33.0*  PLT  --   --  328  --   --   --   --   --  311  --   --  240  APTT 29  --   --   --   --   --   --   --   --   --   --   --   LABPROT 12.1  --   --   --   --   --   --   --   --   --   --   --   INR 0.9  --   --   --   --   --   --   --   --   --   --   --   HEPARINUNFRC  --   --   --   --   --    < > 0.40  --  0.38  --  0.25*  --   CREATININE  --    < > 1.33*  --   --    < > 1.65*  --  1.91* 2.21*  --   --   TROPONINIHS  --   --   --  2,674* 2,965*  --  3,557*  --   --   --   --   --    < > = values in this interval not displayed.     Estimated Creatinine Clearance: 24.1 mL/min (A) (by C-G formula based on SCr of 2.21 mg/dL (H)).  Assessment: 81 y.o. female admitted with Afib and STEMI s/p cath and awaiting CABG versus high-risk PCI. Patient is not on any anticoagulants PTA. Pharmacy has been consulted for heparin.  Heparin level subtherapeutic: 0.25 after being within goal range. Infusion going overnight without any reported issues. Hgb down slightly, no overt bleeding per RN. Will increase ~ 2 units/kg/hr  Goal of  Therapy:  Heparin level 0.3-0.7 units/ml Monitor platelets by anticoagulation protocol: Yes   Plan:  Increase heparin gtt to 1400 units/hr Heparin level in 8 hours Daily heparin level and CBC Monitor for s/sx of bleeding  Georga Bora, PharmD Clinical Pharmacist 01/11/2022 5:21 AM Please check AMION for all [place] numbers

## 2022-01-11 NOTE — Plan of Care (Signed)

## 2022-01-11 NOTE — Progress Notes (Signed)
Inpatient Rehab Admissions Coordinator:   Per PT recommendations pt was screened for CIR by Shann Medal, PT, DPT.  Note plans for surgery next week.  Will follow remotely for post op therapy progress and then rescreen.   Shann Medal, PT, DPT Admissions Coordinator [phone] 01/11/22  4:53 PM

## 2022-01-11 NOTE — Progress Notes (Signed)
Pharmacy Antibiotic Note  Brittany Jackson is a 81 y.o. female admitted on 01/08/2022 with pneumonia. Pharmacy has been consulted for cefepime and vancomycin dosing.  Chest X-ray shows persistent bilateral opacities. Sputum cx with gpc gpr Serum creatinine is currently elevated at 2.4 crcl 77m/min  Plan: Vancomycin 2gm x1 then '1500mg'$  q48h AUC est 500 cefepime 2 g every 24h F/u cultures, renal function, clinical course  De-escalate as indicated   Height: '5\' 6"'$  (167.6 cm) Weight: 107 kg (235 lb 14.3 oz) IBW/kg (Calculated) : 59.3  Temp (24hrs), Avg:97.8 F (36.6 C), Min:97.5 F (36.4 C), Max:98.5 F (36.9 C)  Recent Labs  Lab 01/08/22 2356 01/09/22 0133 01/09/22 0314 01/09/22 1618 01/09/22 2101 01/09/22 2225 01/10/22 0309 01/10/22 1701 01/11/22 0433  WBC 22.8*  --  21.5*  --   --   --  20.5*  --  12.4*  CREATININE 1.11*   < > 1.33* 1.65* 1.65*  --  1.91* 2.21* 2.42*  LATICACIDVEN  --   --   --   --   --  1.1  --   --   --    < > = values in this interval not displayed.     Estimated Creatinine Clearance: 21.8 mL/min (A) (by C-G formula based on SCr of 2.42 mg/dL (H)).    Allergies  Allergen Reactions   Codeine Other (See Comments)    Unknown   Meprobamate Swelling   Sulfa Antibiotics Other (See Comments)    Unknown    Antimicrobials this admission: Cefepime 5/25 >>   Dose adjustments this admission: N/A  Microbiology results: 5/25 Sputum: GPC/GPR 5/24 and 5/25 MRSA PCR: negative    LBonnita NasutiPharm.D. CPP, BCPS Clinical Pharmacist [phone]/26/2023 12:20 PM    Please check [email or web address] for unit-specific pharmacy phone numbers.

## 2022-01-11 NOTE — Progress Notes (Signed)
Patient ID: Brittany Jackson, female   DOB: 1941-06-07, 81 y.o.   MRN: [id number]     Advanced Heart Failure Rounding Note  PCP-Cardiologist: None   Subjective:    Breathing stable on 2L [place]. CXR with bilateral R>L lower lobe opacities, GPCs in sputum.  Afebrile, WBCs down to 12.  Currently on cefepime.   Co-ox 73% on milrinone 0.25.  Off diuretics with CVP 4-5. Na 126, stable.      Creatinine 1.65 => 1.9 => 2.42  Atrial fibrillation converted to NSR overnight, now in NSR 80s on amiodarone 60 mg/hr + heparin gtt.   Objective:   Weight Range: 107 kg Body mass index is 38.07 kg/m.   Vital Signs:   Temp:  [97.5 F (36.4 C)-98.5 F (36.9 C)] 97.5 F (36.4 C) (05/26 0352) Pulse Rate:  [70-145] 76 (05/26 0730) Resp:  [14-33] 24 (05/26 0730) SpO2:  [90 %-100 %] 99 % (05/26 0730) Arterial Line BP: (75-139)/(33-78) 107/55 (05/26 0730) Weight:  [703 kg] 107 kg (05/26 0700) Last BM Date : 01/08/22  Weight change: Filed Weights   01/10/22 0545 01/10/22 0700 01/11/22 0700  Weight: 110 kg 109 kg 107 kg    Intake/Output:   Intake/Output Summary (Last 24 hours) at 01/11/2022 0813 Last data filed at 01/11/2022 0700 Gross per 24 hour  Intake 1888.39 ml  Output 1765 ml  Net 123.39 ml      Physical Exam    General: NAD Neck: No JVD, no thyromegaly or thyroid nodule.  Lungs: Clear to auscultation bilaterally with normal respiratory effort. CV: Nondisplaced PMI.  Heart regular S1/S2, no S3/S4, no murmur.  No peripheral edema.   Abdomen: Soft, nontender, no hepatosplenomegaly, no distention.  Skin: Intact without lesions or rashes.  Neurologic: Alert and oriented x 3.  Psych: Normal affect. Extremities: No clubbing or cyanosis.  HEENT: Normal.   Telemetry   NSR 80s (personally reviewed)  Labs    CBC Recent Labs    01/08/22 2356 01/09/22 0133 01/10/22 0309 01/11/22 0433  WBC 22.8*   < > 20.5* 12.4*  NEUTROABS 15.8*  --   --   --   HGB 14.9   < > 12.1 10.8*  HCT 45.8    < > 35.9* 33.0*  MCV 94.6   < > 89.8 91.2  PLT 390   < > 311 240   < > = values in this interval not displayed.   Basic Metabolic Panel Recent Labs    01/10/22 1347 01/10/22 1701 01/11/22 0433  NA  --  126* 126*  K  --  5.2* 4.1  CL  --  94* 92*  CO2  --  19* 22  GLUCOSE  --  119* 118*  BUN  --  31* 34*  CREATININE  --  2.21* 2.42*  CALCIUM  --  8.3* 8.1*  MG 1.5*  --  2.5*   Liver Function Tests Recent Labs    01/10/22 0309 01/11/22 0433  AST 34 23  ALT 18 15  ALKPHOS 79 67  BILITOT 0.4 0.8  PROT 6.0* 5.4*  ALBUMIN 3.2* 2.9*   No results for input(s): LIPASE, AMYLASE in the last 72 hours. Cardiac Enzymes No results for input(s): CKTOTAL, CKMB, CKMBINDEX, TROPONINI in the last 72 hours.  BNP: BNP (last 3 results) Recent Labs    01/08/22 2356  BNP 227.4*    ProBNP (last 3 results) No results for input(s): PROBNP in the last 8760 hours.   D-Dimer No results for  input(s): DDIMER in the last 72 hours. Hemoglobin A1C Recent Labs    01/09/22 0322  HGBA1C 5.7*   Fasting Lipid Panel Recent Labs    01/09/22 0017  CHOL 268*  HDL 74  LDLCALC 164*  TRIG 148  CHOLHDL 3.6   Thyroid Function Tests Recent Labs    01/09/22 0322  TSH 1.906    Other results:   Imaging    VAS [place] LOWER EXTREMITY SAPHENOUS VEIN MAPPING  Result Date: 01/10/2022 LOWER EXTREMITY VEIN MAPPING Patient Name:  Brittany Jackson  Date of Exam:   01/10/2022 Medical Rec #: [id number]      Accession #:    [id number] Date of Birth: 04/04/1941      Patient Gender: F Patient Age:   67 years Exam Location:  [facility] Procedure:      VAS [place] LOWER EXTREMITY SAPHENOUS VEIN MAPPING Referring Phys: Collier Salina VANTRIGT --------------------------------------------------------------------------------  Indications:  Varicose vein ligation Risk Factors: Hypertension.  Comparison Study: No prior studies. Performing Technologist: Oliver Hum RVT  Examination Guidelines: A complete evaluation  includes B-mode imaging, spectral Doppler, color Doppler, and power Doppler as needed of all accessible portions of each vessel. Bilateral testing is considered an integral part of a complete examination. Limited examinations for reoccurring indications may be performed as noted. +---------------+-----------+----------------------+---------------+-----------+   RT Diameter  RT Findings         GSV            LT Diameter  LT Findings      (cm)                                            (cm)                  +---------------+-----------+----------------------+---------------+-----------+      0.70                     Saphenofemoral         1.00                                                   Junction                                  +---------------+-----------+----------------------+---------------+-----------+      0.53                     Proximal thigh         0.42       branching  +---------------+-----------+----------------------+---------------+-----------+      0.50       branching       Mid thigh            0.17       branching  +---------------+-----------+----------------------+---------------+-----------+      0.36                      Distal thigh          0.41       branching  +---------------+-----------+----------------------+---------------+-----------+      0.35       branching  Knee              0.44       branching  +---------------+-----------+----------------------+---------------+-----------+      0.43       branching       Prox calf            0.36                  +---------------+-----------+----------------------+---------------+-----------+      0.27       branching        Mid calf            0.42       branching  +---------------+-----------+----------------------+---------------+-----------+      0.23       branching      Distal calf           0.48       branching   +---------------+-----------+----------------------+---------------+-----------+ Diagnosing physician: Jamelle Haring Electronically signed by Jamelle Haring on 01/10/2022 at 4:53:39 PM.    Final      Medications:     Scheduled Medications:  aspirin  81 mg Oral Daily   atorvastatin  80 mg Oral Daily   Chlorhexidine Gluconate Cloth  6 each Topical Daily   famotidine  20 mg Oral BID   feeding supplement  237 mL Oral TID WC   fluticasone furoate-vilanterol  1 puff Inhalation Daily   guaiFENesin  600 mg Oral BID   montelukast  10 mg Oral QHS   nicotine  14 mg Transdermal Daily   sodium chloride flush  10-40 mL Intracatheter Q12H   sodium chloride flush  3 mL Intravenous Q12H   thiamine  100 mg Oral Daily    Infusions:  sodium chloride Stopped (01/09/22 0046)   sodium chloride     sodium chloride     amiodarone 60 mg/hr (01/11/22 0700)   ceFEPime (MAXIPIME) IV     heparin 1,400 Units/hr (01/11/22 0700)   milrinone 0.25 mcg/kg/min (01/11/22 0700)    PRN Medications: sodium chloride, Place/Maintain arterial line **AND** sodium chloride, acetaminophen, albuterol, diazepam, docusate sodium, ondansetron (ZOFRAN) IV, sodium chloride flush, sodium chloride flush, zolpidem   Assessment/Plan   1. CAD: Patient presented with anterolateral STEMI. Peak HS-TnI 2965. LHC with severe 3VD, 60% dLM, 90% ostial LAD, 75% stenosis at bifurcation of mLAD and D, 75% stenosis at bifurcation of mLCx and OM.  Patient would be best served by CABG if stable enough for it, but may be too high risk with severe ischemic cardiomyopathy, COPD, and renal dysfunction. No further chest pain.  - ASA 81 - heparin gtt - atorvastatin 80 daily - Dr. Prescott Gum has seen, needs significant stabilization prior to CABG.  If she ends up not being a CABG candidate, will need high risk PCI once creatinine has stabilized.   2. Acute systolic CHF: Ischemic cardiomyopathy.  Low output on initial RHC with CI 1.8.  Filling pressures  significantly elevated on RHC as well.  Echo with EF 20-25%, mildly decreased RV systolic function, trivial MR, dilated IVC. CVP down to 4-5 with diuresis, now off Lasix.  Co-ox 73% on milrinone 0.25.  Creatinine up to 2.4.  - Hold diuretics with low CVP and rising creatinine.  - Continue milrinone 0.25 mcg/kg/min.  - If she develops further ischemic-type chest pain or does not respond well to milrinone, may need IABP placement.  3.  Atrial fibrillation: Paroxysmal, noted initially this hospitalization.  She is in NSR now.  - Continue  heparin gtt.  - Continue amiodarone gtt.  4.  AKI: Creatinine up to 1.65 => 1.9 => 2.4. Suspect cardiorenal with low output and initial diuresis.  Now off diuretics with CVP 4-5.  - Hopefully, this will improve with milrinone and holding diuresis.  5. ID: CXR with R>L lower lobe infiltrates.  WBCs 20 => 12, no fever.  Afebrile.  Sputum culture with GPCs.  - Covering for HAP with cefepime/vancomycin.  6. COPD: Patient was a smoker up until admission.  Will eventually need PFTs when PNA treated and more mobile.   PT evaluation.   CRITICAL CARE Performed by: Loralie Champagne  Total critical care time: 35 minutes  Critical care time was exclusive of separately billable procedures and treating other patients.  Critical care was necessary to treat or prevent imminent or life-threatening deterioration.  Critical care was time spent personally by me on the following activities: development of treatment plan with patient and/or surrogate as well as nursing, discussions with consultants, evaluation of patient's response to treatment, examination of patient, obtaining history from patient or surrogate, ordering and performing treatments and interventions, ordering and review of laboratory studies, ordering and review of radiographic studies, pulse oximetry and re-evaluation of patient's condition.   Length of Stay: 2  Loralie Champagne, MD  01/11/2022, 8:13 AM  Advanced  Heart Failure Team Pager [phone] (M-F; 7a - 5p)  Please contact [facility] for night-coverage after hours (5p -7a ) and weekends on [email or web address]

## 2022-01-11 NOTE — Progress Notes (Signed)
3 Days Post-Op Procedure(s) (LRB): Coronary/Graft Acute MI Revascularization (N/A) RIGHT HEART CATH (N/A) Subjective: Creatinine up 2.2- holding lasix Sputum smear with gram pos gram neg - on vanc/maxapime WBC down to 12k Objective: Vital signs in last 24 hours: Temp:  [97.5 F (36.4 C)-98.5 F (36.9 C)] 97.6 F (36.4 C) (05/26 1130) Pulse Rate:  [70-145] 79 (05/26 1530) Cardiac Rhythm: Normal sinus rhythm (05/26 1500) Resp:  [14-33] 21 (05/26 1530) BP: (103-128)/(43-79) 125/46 (05/26 1530) SpO2:  [94 %-100 %] 98 % (05/26 1530) Arterial Line BP: (64-128)/(38-78) 83/41 (05/26 1530) Weight:  [761 kg] 107 kg (05/26 0700)  Hemodynamic parameters for last 24 hours: CVP:  [0 mmHg-72 mmHg] 1 mmHg  Intake/Output from previous day: 05/25 0701 - 05/26 0700 In: 2167.4 [P.O.:720; I.V.:1247.5; IV Piggyback:199.9] Out: 2125 [Urine:2125] Intake/Output this shift: Total I/O In: 1964.8 [P.O.:960; I.V.:454.8; Other:50; IV Piggyback:499.9] Out: 440 [Urine:440]  Scattered rhonchi Nsr + pedal pulse  Lab Results: Recent Labs    01/10/22 0309 01/11/22 0433  WBC 20.5* 12.4*  HGB 12.1 10.8*  HCT 35.9* 33.0*  PLT 311 240   BMET:  Recent Labs    01/10/22 1701 01/11/22 0433  NA 126* 126*  K 5.2* 4.1  CL 94* 92*  CO2 19* 22  GLUCOSE 119* 118*  BUN 31* 34*  CREATININE 2.21* 2.42*  CALCIUM 8.3* 8.1*    PT/INR:  Recent Labs    01/09/22 0017  LABPROT 12.1  INR 0.9   ABG    Component Value Date/Time   PHART 7.404 01/10/2022 0306   HCO3 24.4 01/10/2022 0306   TCO2 26 01/10/2022 0306   ACIDBASEDEF 5.0 (H) 01/09/2022 0201   O2SAT 72.7 01/11/2022 0432   CBG (last 3)  Recent Labs    01/10/22 1341  GLUCAP 145*    Assessment/Plan: S/P Procedure(s) (LRB): Coronary/Graft Acute MI Revascularization (N/A) RIGHT HEART CATH (N/A) Mobilize Diabetes control Cont milrinone, antibiotics, moibilization Recheck bedside echo next week Timing of surgery dependent on end organ  improvement   LOS: 2 days    Brittany Jackson 01/11/2022

## 2022-01-12 ENCOUNTER — Inpatient Hospital Stay (HOSPITAL_COMMUNITY): Payer: Medicare Other

## 2022-01-12 DIAGNOSIS — I5041 Acute combined systolic (congestive) and diastolic (congestive) heart failure: Secondary | ICD-10-CM | POA: Diagnosis not present

## 2022-01-12 DIAGNOSIS — I2102 ST elevation (STEMI) myocardial infarction involving left anterior descending coronary artery: Secondary | ICD-10-CM | POA: Diagnosis not present

## 2022-01-12 LAB — BASIC METABOLIC PANEL
Anion gap: 9 (ref 5–15)
Anion gap: 12 (ref 5–15)
BUN: 40 mg/dL — ABNORMAL HIGH (ref 8–23)
BUN: 42 mg/dL — ABNORMAL HIGH (ref 8–23)
CO2: 21 mmol/L — ABNORMAL LOW (ref 22–32)
CO2: 21 mmol/L — ABNORMAL LOW (ref 22–32)
Calcium: 8 mg/dL — ABNORMAL LOW (ref 8.9–10.3)
Calcium: 8 mg/dL — ABNORMAL LOW (ref 8.9–10.3)
Chloride: 92 mmol/L — ABNORMAL LOW (ref 98–111)
Chloride: 89 mmol/L — ABNORMAL LOW (ref 98–111)
Creatinine, Ser: 2.8 mg/dL — ABNORMAL HIGH (ref 0.44–1.00)
Creatinine, Ser: 3.03 mg/dL — ABNORMAL HIGH (ref 0.44–1.00)
GFR, Estimated: 16 mL/min — ABNORMAL LOW (ref >60)
GFR, Estimated: 15 mL/min — ABNORMAL LOW (ref >60)
Glucose, Bld: 86 mg/dL (ref 70–99)
Glucose, Bld: 123 mg/dL — ABNORMAL HIGH (ref 70–99)
Potassium: 4.1 mmol/L (ref 3.5–5.1)
Potassium: 4.3 mmol/L (ref 3.5–5.1)
Sodium: 122 mmol/L — ABNORMAL LOW (ref 135–145)
Sodium: 122 mmol/L — ABNORMAL LOW (ref 135–145)

## 2022-01-12 LAB — GLUCOSE, CAPILLARY
Glucose-Capillary: 108 mg/dL — ABNORMAL HIGH (ref 70–99)
Glucose-Capillary: 94 mg/dL (ref 70–99)
Glucose-Capillary: 122 mg/dL — ABNORMAL HIGH (ref 70–99)
Glucose-Capillary: 125 mg/dL — ABNORMAL HIGH (ref 70–99)

## 2022-01-12 LAB — COOXEMETRY PANEL
Carboxyhemoglobin: 1.8 % — ABNORMAL HIGH (ref 0.5–1.5)
Methemoglobin: <0.7 % (ref 0.0–1.5)
O2 Saturation: 72.1 %
Total hemoglobin: 10.3 g/dL — ABNORMAL LOW (ref 12.0–16.0)

## 2022-01-12 LAB — CBC
HCT: 29.5 % — ABNORMAL LOW (ref 36.0–46.0)
Hemoglobin: 10.1 g/dL — ABNORMAL LOW (ref 12.0–15.0)
MCH: 30.2 pg (ref 26.0–34.0)
MCHC: 34.2 g/dL (ref 30.0–36.0)
MCV: 88.3 fL (ref 80.0–100.0)
Platelets: 231 10*3/uL (ref 150–400)
RBC: 3.34 MIL/uL — ABNORMAL LOW (ref 3.87–5.11)
RDW: 13.2 % (ref 11.5–15.5)
WBC: 11.5 10*3/uL — ABNORMAL HIGH (ref 4.0–10.5)
nRBC: 0 % (ref 0.0–0.2)

## 2022-01-12 LAB — HEPATIC FUNCTION PANEL
ALT: 14 U/L (ref 0–44)
AST: 21 U/L (ref 15–41)
Albumin: 2.7 g/dL — ABNORMAL LOW (ref 3.5–5.0)
Alkaline Phosphatase: 63 U/L (ref 38–126)
Bilirubin, Direct: <0.1 mg/dL (ref 0.0–0.2)
Total Bilirubin: 0.4 mg/dL (ref 0.3–1.2)
Total Protein: 5.3 g/dL — ABNORMAL LOW (ref 6.5–8.1)

## 2022-01-12 LAB — HEPARIN LEVEL (UNFRACTIONATED): Heparin Unfractionated: 0.45 IU/mL (ref 0.30–0.70)

## 2022-01-12 LAB — TROPONIN I (HIGH SENSITIVITY): Troponin I (High Sensitivity): 322 ng/L (ref <18)

## 2022-01-12 MED ORDER — SALINE SPRAY 0.65 % NA SOLN: 1.0000 | NASAL | Status: DC | PRN

## 2022-01-12 MED ORDER — NITROGLYCERIN 0.1 MG/HR TD PT24
0.1000 mg | MEDICATED_PATCH | Freq: Every day | TRANSDERMAL | Status: DC
  Administered 2022-01-13 – 2022-01-16 (×3): 0.1 mg via TRANSDERMAL
  Filled 2022-01-12 (×6): qty 1

## 2022-01-12 MED ORDER — TOLVAPTAN 15 MG PO TABS
15.0000 mg | ORAL_TABLET | Freq: Once | ORAL | Status: AC
  Administered 2022-01-12: 15 mg via ORAL
  Filled 2022-01-12: qty 1

## 2022-01-12 MED ORDER — TRAMADOL HCL 50 MG PO TABS
50.0000 mg | ORAL_TABLET | Freq: Two times a day (BID) | ORAL | Status: DC | PRN
  Administered 2022-01-13 – 2022-01-14 (×2): 50 mg via ORAL
  Filled 2022-01-12 (×2): qty 1

## 2022-01-12 MED ORDER — DIAZEPAM 5 MG PO TABS
10.0000 mg | ORAL_TABLET | Freq: Three times a day (TID) | ORAL | Status: DC | PRN
  Administered 2022-01-13: 20 mg via ORAL
  Administered 2022-01-13 – 2022-01-16 (×6): 10 mg via ORAL
  Administered 2022-01-17 – 2022-01-18 (×4): 20 mg via ORAL
  Administered 2022-01-19 – 2022-01-22 (×4): 10 mg via ORAL
  Filled 2022-01-12: qty 4
  Filled 2022-01-12 (×4): qty 2
  Filled 2022-01-12 (×2): qty 4
  Filled 2022-01-12 (×8): qty 2
  Filled 2022-01-12: qty 4
  Filled 2022-01-12: qty 2

## 2022-01-12 NOTE — Progress Notes (Addendum)
Pt c/o ongoing HA and anxiety, unchanged by PRN tylenol and valium. VSS, respiratory and cardiac assessment unchanged. Pt says she usually takes '20mg'$  valium at home when she feels this ways. Pt requests something to help with the HA and anxiety so she can sleep.  Cards fellow text-paged, awaiting response.   11:13 PM    Addendum: Cards fellow ordered tramadol, text-paged again to clarify safety given pt's worsening renal fxn. Discussed with Dr. Humphrey Rolls, plan to increase Valium PRN range to treat pt's anxiety, and safe to give ultram if she continues to need more analgesia for HA. 11:45 PM

## 2022-01-12 NOTE — Progress Notes (Signed)
Entered patients room and she c/o chest "pressure radiating from center of chest to left"; notified cardiology, orders received for EKG and trops.  Md to bedside.

## 2022-01-12 NOTE — Progress Notes (Signed)
Patient wanted to go for a walk.  We used the steady for assistance.  At about 190 ft. Patient began to c/o chest heaviness.  This was relieved when returned to room and pt sat on side of bed.  Dr. Johney Frame made aware.

## 2022-01-12 NOTE — Progress Notes (Signed)
Patient ID: Brittany Jackson, female   DOB: Sep 09, 1940, 81 y.o.   MRN: [id number]     Advanced Heart Failure Rounding Note  PCP-Cardiologist: None   Subjective:    Remains on 2L [place]. CXR with bilateral R>L lower lobe opacities, GPCs in sputum.  Afebrile, WBCs down to 12.  Currently on cefepime.   Remains on milrinone 0.25. Co-ox stable at 72%. Diuretics on hold due to AKI. CXR unchanged with RLL infiltrate. CVP 8 (checked personally)  Creatinine 1.65 => 1.9 => 2.42 => 2.8 ~1.5L urine output  Holding NSR on IV amio. No bleeding with heparin.   Denies CP. Having cough. Small BM yesterday but feels constipated.   Objective:   Weight Range: 112.4 kg Body mass index is 40 kg/m.   Vital Signs:   Temp:  [97.6 F (36.4 C)-98.6 F (37 C)] 97.8 F (36.6 C) (05/27 0822) Pulse Rate:  [71-83] 76 (05/27 0739) Resp:  [16-27] 16 (05/27 0739) BP: (89-147)/(39-80) 120/40 (05/27 0739) SpO2:  [92 %-100 %] 94 % (05/27 0739) Arterial Line BP: (64-86)/(38-56) 80/49 (05/26 1600) Weight:  [112.4 kg] 112.4 kg (05/27 0655) Last BM Date : 01/11/22  Weight change: Filed Weights   01/10/22 0700 01/11/22 0700 01/12/22 0655  Weight: 109 kg 107 kg 112.4 kg    Intake/Output:   Intake/Output Summary (Last 24 hours) at 01/12/2022 0841 Last data filed at 01/12/2022 0709 Gross per 24 hour  Intake 3679.45 ml  Output 1430 ml  Net 2249.45 ml       Physical Exam    General:  Elderly No resp difficulty HEENT: normal Neck: supple. JVP 8. Carotids 2+ bilat; no bruits. No lymphadenopathy or thryomegaly appreciated. Cor: PMI nondisplaced. Regular rate & rhythm. No rubs, gallops or murmurs. Lungs: mild crackles Abdomen: soft, nontender, nondistended. No hepatosplenomegaly. No bruits or masses. Good bowel sounds. Extremities: no cyanosis, clubbing, rash, 1+ edema Neuro: alert & orientedx3, cranial nerves grossly intact. moves all 4 extremities w/o difficulty. Affect pleasant   Telemetry   NSR 70-80s  (personally reviewed)  Labs    CBC Recent Labs    01/11/22 0433 01/12/22 0500  WBC 12.4* 11.5*  HGB 10.8* 10.1*  HCT 33.0* 29.5*  MCV 91.2 88.3  PLT 240 025    Basic Metabolic Panel Recent Labs    01/10/22 1347 01/10/22 1701 01/11/22 0433 01/12/22 0500  NA  --    < > 126* 122*  K  --    < > 4.1 4.1  CL  --    < > 92* 92*  CO2  --    < > 22 21*  GLUCOSE  --    < > 118* 86  BUN  --    < > 34* 40*  CREATININE  --    < > 2.42* 2.80*  CALCIUM  --    < > 8.1* 8.0*  MG 1.5*  --  2.5*  --    < > = values in this interval not displayed.    Liver Function Tests Recent Labs    01/10/22 0309 01/11/22 0433  AST 34 23  ALT 18 15  ALKPHOS 79 67  BILITOT 0.4 0.8  PROT 6.0* 5.4*  ALBUMIN 3.2* 2.9*    No results for input(s): LIPASE, AMYLASE in the last 72 hours. Cardiac Enzymes No results for input(s): CKTOTAL, CKMB, CKMBINDEX, TROPONINI in the last 72 hours.  BNP: BNP (last 3 results) Recent Labs    01/08/22 2356  BNP 227.4*  ProBNP (last 3 results) No results for input(s): PROBNP in the last 8760 hours.   D-Dimer No results for input(s): DDIMER in the last 72 hours. Hemoglobin A1C No results for input(s): HGBA1C in the last 72 hours.  Fasting Lipid Panel No results for input(s): CHOL, HDL, LDLCALC, TRIG, CHOLHDL, LDLDIRECT in the last 72 hours.  Thyroid Function Tests No results for input(s): TSH, T4TOTAL, T3FREE, THYROIDAB in the last 72 hours.  Invalid input(s): FREET3   Other results:   Imaging    No results found.   Medications:     Scheduled Medications:  aspirin  81 mg Oral Daily   atorvastatin  80 mg Oral Daily   Chlorhexidine Gluconate Cloth  6 each Topical Daily   docusate sodium  100 mg Oral BID   [START ON 01/15/2022] epinephrine  0-10 mcg/min Intravenous To OR   famotidine  20 mg Oral Daily   feeding supplement  237 mL Oral TID WC   fluticasone furoate-vilanterol  1 puff Inhalation Daily   guaiFENesin  600 mg Oral BID    [START ON 01/15/2022] heparin-papaverine-plasmalyte irrigation   Irrigation To OR   insulin aspart  0-15 Units Subcutaneous TID WC   insulin aspart  0-5 Units Subcutaneous QHS   [START ON 01/15/2022] insulin   Intravenous To OR   [START ON 01/15/2022] [place] Blood Cardioplegia vial (lidocaine/magnesium/mannitol 0.26g-4g-6.4g)   Intracoronary To OR   montelukast  10 mg Oral QHS   multivitamin with minerals  1 tablet Oral Daily   nicotine  14 mg Transdermal Daily   [START ON 01/15/2022] phenylephrine  30-200 mcg/min Intravenous To OR   polyethylene glycol  17 g Oral Daily   [START ON 01/15/2022] potassium chloride  80 mEq Other To OR   senna  1 tablet Oral Daily   sodium chloride flush  10-40 mL Intracatheter Q12H   sodium chloride flush  3 mL Intravenous Q12H   sodium phosphate  1 enema Rectal Once   thiamine  100 mg Oral Daily   [START ON 01/15/2022] tranexamic acid  15 mg/kg Intravenous To OR   [START ON 01/15/2022] tranexamic acid  2 mg/kg Intracatheter To OR   vancomycin variable dose per unstable renal function (pharmacist dosing)   Does not apply See admin instructions    Infusions:  sodium chloride Stopped (01/09/22 0046)   sodium chloride 10 mL/hr at 01/12/22 0700   sodium chloride     amiodarone 60 mg/hr (01/12/22 0700)   [START ON 01/15/2022]  ceFAZolin (ANCEF) IV     [START ON 01/15/2022]  ceFAZolin (ANCEF) IV     ceFEPime (MAXIPIME) IV Stopped (01/11/22 1258)   [START ON 01/15/2022] dexmedetomidine     [START ON 01/15/2022] heparin 30,000 units/NS 1000 mL solution for CELLSAVER     heparin 1,400 Units/hr (01/12/22 0700)   milrinone 0.25 mcg/kg/min (01/12/22 0700)   [START ON 01/15/2022] milrinone     [START ON 01/15/2022] nitroGLYCERIN     [START ON 01/15/2022] norepinephrine     [START ON 01/15/2022] tranexamic acid (CYKLOKAPRON) infusion ([facility])     [START ON 01/15/2022] vancomycin     [START ON 01/13/2022] vancomycin      PRN Medications: sodium chloride, Place/Maintain  arterial line **AND** sodium chloride, acetaminophen, albuterol, diazepam, ondansetron (ZOFRAN) IV, pneumococcal 20-valent conjugate vaccine, sodium chloride flush, sodium chloride flush, zolpidem   Assessment/Plan   1. CAD: Patient presented with anterolateral STEMI. Peak HS-TnI 2965. LHC with severe 3VD, 60% dLM, 90% ostial LAD, 75% stenosis at  bifurcation of mLAD and D, 75% stenosis at bifurcation of mLCx and OM.  Patient would be best served by CABG if stable enough for it, but may be too high risk with severe ischemic cardiomyopathy, COPD, and renal dysfunction. No current anginal symptoms - ASA 81 - heparin gtt - atorvastatin 80 daily - Dr. Prescott Gum has seen, needs significant stabilization prior to CABG.  If she ends up not being a CABG candidate, will need high risk PCI once creatinine has stabilized.   2. Acute systolic CHF: Ischemic cardiomyopathy.  Low output on initial RHC with CI 1.8.  Filling pressures significantly elevated on RHC as well.  Echo with EF 20-25%, mildly decreased RV systolic function, trivial MR, dilated IVC. CVP up 4->8 Co-ox 73% on milrinone 0.25.  Creatinine up to 2.8 (baseline ~1.7) - Continue to hold diuretics one more day - Continue milrinone 0.25 mcg/kg/min.  - If she develops further ischemic-type chest pain or does not respond well to milrinone, may need IABP placement.  3.  Atrial fibrillation: Paroxysmal, noted initially this hospitalization.  Now in NSR - Continue heparin gtt.  - Continue amiodarone gtt.  4.  AKI: Creatinine up to 1.65 => 1.9 => 2.4 => 2.8. Suspect cardiorenal/ATN with low output and initial diuresis.  CVP remains low. Continue to hold diuretics - Hopefully, this will start to improve soon with milrinone and holding diuresis. Keep MAPs >= 65 SBP >= 703 (low diastolics) 5. ID: CXR with R>L lower lobe infiltrates.  WBCs 20 => 12 => 11.5, no fever.  Afebrile.  Sputum culture with GPCs.  - Covering for HAP with cefepime/vancomycin.  -  Continue pulmonary toilet 6. COPD: Patient was a smoker up until admission.  Will eventually need PFTs when PNA treated and more mobile. Bedside PFTs 5/23 FEV 1 1.09L (53%) FVC 1.68 L (61%) FEF 25-75 0.54 (39%) - aggressive pulmonary toilet 7. Hyponatremia  - Na 126 -> 122 - Will give one dose of tolvaptan - Limit FW 8. Deconditioning - ambulate   CRITICAL CARE Performed by: Glori Bickers  Total critical care time: 35 minutes  Critical care time was exclusive of separately billable procedures and treating other patients.  Critical care was necessary to treat or prevent imminent or life-threatening deterioration.  Critical care was time spent personally by me on the following activities: development of treatment plan with patient and/or surrogate as well as nursing, discussions with consultants, evaluation of patient's response to treatment, examination of patient, obtaining history from patient or surrogate, ordering and performing treatments and interventions, ordering and review of laboratory studies, ordering and review of radiographic studies, pulse oximetry and re-evaluation of patient's condition.   Length of Stay: 3  Glori Bickers, MD  01/12/2022, 8:41 AM  Advanced Heart Failure Team Pager [phone] (M-F; 7a - 5p)  Please contact [facility] for night-coverage after hours (5p -7a ) and weekends on [email or web address]

## 2022-01-12 NOTE — Progress Notes (Signed)
Notified by nursing that patient was having substernal chest pressure that had been ongoing for ~57mn (patient had just notified nursing this was occurring). By the time of my arrival, it had resolved. ECG was unchanged. Trop continues to downtrend. Will continue to monitor.  HGwyndolyn Kaufman MD

## 2022-01-12 NOTE — Plan of Care (Signed)

## 2022-01-12 NOTE — Progress Notes (Signed)
ANTICOAGULATION CONSULT NOTE - Follow Up Consult  Pharmacy Consult for heparin Indication: chest pain/ACS and atrial fibrillation  Allergies  Allergen Reactions   Codeine Other (See Comments)    Unknown   Meprobamate Swelling   Sulfa Antibiotics Other (See Comments)    Unknown    Patient Measurements: Height: '5\' 6"'$  (167.6 cm) Weight: 112.4 kg (247 lb 12.8 oz) IBW/kg (Calculated) : 59.3 Heparin Dosing Weight: 85.7 kg  Vital Signs: Temp: 97.8 F (36.6 C) (05/27 1148) Temp Source: Oral (05/27 1148) BP: 119/41 (05/27 0910) Pulse Rate: 74 (05/27 0910)  Labs: Recent Labs    01/09/22 2101 01/10/22 0306 01/10/22 0309 01/10/22 1701 01/11/22 0432 01/11/22 0433 01/11/22 1556 01/12/22 0500  HGB  --    < > 12.1  --   --  10.8*  --  10.1*  HCT  --    < > 35.9*  --   --  33.0*  --  29.5*  PLT  --   --  311  --   --  240  --  231  HEPARINUNFRC 0.40  --  0.38  --  0.25*  --  0.34 0.45  CREATININE 1.65*  --  1.91* 2.21*  --  2.42*  --  2.80*  TROPONINIHS 3,557*  --   --   --   --  727*  --   --    < > = values in this interval not displayed.     Estimated Creatinine Clearance: 19.3 mL/min (A) (by C-G formula based on SCr of 2.8 mg/dL (H)).  Assessment: 81 y.o. female admitted with Afib and STEMI s/p cath and awaiting CABG versus high-risk PCI. Patient is not on any anticoagulants PTA. Pharmacy has been consulted for heparin.  Heparin level in goal range this AM.  Hgb down slightly, no overt bleeding per RN.   Goal of Therapy:  Heparin level 0.3-0.7 units/ml Monitor platelets by anticoagulation protocol: Yes   Plan:  Continue IV heparin at 1400 units/hr Daily heparin level and CBC Monitor for s/sx of bleeding  Marguerite Olea, [facility] Clinical Pharmacist  01/12/2022 12:36 PM   [facility] pharmacy phone numbers are listed on [email or web address]

## 2022-01-13 ENCOUNTER — Inpatient Hospital Stay (HOSPITAL_COMMUNITY): Payer: Medicare Other

## 2022-01-13 DIAGNOSIS — I5041 Acute combined systolic (congestive) and diastolic (congestive) heart failure: Secondary | ICD-10-CM | POA: Diagnosis not present

## 2022-01-13 DIAGNOSIS — N179 Acute kidney failure, unspecified: Secondary | ICD-10-CM | POA: Diagnosis not present

## 2022-01-13 DIAGNOSIS — I2109 ST elevation (STEMI) myocardial infarction involving other coronary artery of anterior wall: Secondary | ICD-10-CM | POA: Diagnosis not present

## 2022-01-13 LAB — CBC
HCT: 29.2 % — ABNORMAL LOW (ref 36.0–46.0)
Hemoglobin: 10.1 g/dL — ABNORMAL LOW (ref 12.0–15.0)
MCH: 30.6 pg (ref 26.0–34.0)
MCHC: 34.6 g/dL (ref 30.0–36.0)
MCV: 88.5 fL (ref 80.0–100.0)
Platelets: 237 10*3/uL (ref 150–400)
RBC: 3.3 MIL/uL — ABNORMAL LOW (ref 3.87–5.11)
RDW: 13.3 % (ref 11.5–15.5)
WBC: 12.7 10*3/uL — ABNORMAL HIGH (ref 4.0–10.5)
nRBC: 0 % (ref 0.0–0.2)

## 2022-01-13 LAB — URINALYSIS, ROUTINE W REFLEX MICROSCOPIC
Bilirubin Urine: NEGATIVE
Glucose, UA: NEGATIVE mg/dL
Ketones, ur: NEGATIVE mg/dL
Leukocytes,Ua: NEGATIVE
Nitrite: NEGATIVE
Protein, ur: NEGATIVE mg/dL
Specific Gravity, Urine: 1.013 (ref 1.005–1.030)
pH: 5 (ref 5.0–8.0)

## 2022-01-13 LAB — CULTURE, RESPIRATORY W GRAM STAIN
Culture: NORMAL
Gram Stain: NONE SEEN

## 2022-01-13 LAB — BASIC METABOLIC PANEL
Anion gap: 10 (ref 5–15)
Anion gap: 13 (ref 5–15)
BUN: 47 mg/dL — ABNORMAL HIGH (ref 8–23)
BUN: 49 mg/dL — ABNORMAL HIGH (ref 8–23)
CO2: 21 mmol/L — ABNORMAL LOW (ref 22–32)
CO2: 18 mmol/L — ABNORMAL LOW (ref 22–32)
Calcium: 8 mg/dL — ABNORMAL LOW (ref 8.9–10.3)
Calcium: 8.1 mg/dL — ABNORMAL LOW (ref 8.9–10.3)
Chloride: 89 mmol/L — ABNORMAL LOW (ref 98–111)
Chloride: 86 mmol/L — ABNORMAL LOW (ref 98–111)
Creatinine, Ser: 3.4 mg/dL — ABNORMAL HIGH (ref 0.44–1.00)
Creatinine, Ser: 3.64 mg/dL — ABNORMAL HIGH (ref 0.44–1.00)
GFR, Estimated: 13 mL/min — ABNORMAL LOW (ref >60)
GFR, Estimated: 12 mL/min — ABNORMAL LOW (ref >60)
Glucose, Bld: 95 mg/dL (ref 70–99)
Glucose, Bld: 127 mg/dL — ABNORMAL HIGH (ref 70–99)
Potassium: 4.4 mmol/L (ref 3.5–5.1)
Potassium: 4.7 mmol/L (ref 3.5–5.1)
Sodium: 120 mmol/L — ABNORMAL LOW (ref 135–145)
Sodium: 117 mmol/L — CL (ref 135–145)

## 2022-01-13 LAB — GLUCOSE, CAPILLARY
Glucose-Capillary: 96 mg/dL (ref 70–99)
Glucose-Capillary: 109 mg/dL — ABNORMAL HIGH (ref 70–99)
Glucose-Capillary: 111 mg/dL — ABNORMAL HIGH (ref 70–99)
Glucose-Capillary: 96 mg/dL (ref 70–99)

## 2022-01-13 LAB — COOXEMETRY PANEL
Carboxyhemoglobin: 1.7 % — ABNORMAL HIGH (ref 0.5–1.5)
Methemoglobin: <0.7 % (ref 0.0–1.5)
O2 Saturation: 69.2 %
Total hemoglobin: 10.2 g/dL — ABNORMAL LOW (ref 12.0–16.0)

## 2022-01-13 LAB — OSMOLALITY, URINE: Osmolality, Ur: 203 mOsm/kg — ABNORMAL LOW (ref 300–900)

## 2022-01-13 LAB — HEPARIN LEVEL (UNFRACTIONATED): Heparin Unfractionated: 0.64 IU/mL (ref 0.30–0.70)

## 2022-01-13 LAB — CREATININE, URINE, RANDOM: Creatinine, Urine: 78.28 mg/dL

## 2022-01-13 LAB — SODIUM, URINE, RANDOM: Sodium, Ur: 52 mmol/L

## 2022-01-13 LAB — VANCOMYCIN, RANDOM: Vancomycin Rm: 16

## 2022-01-13 MED ORDER — FUROSEMIDE 10 MG/ML IJ SOLN
80.0000 mg | Freq: Once | INTRAMUSCULAR | Status: AC
  Administered 2022-01-13: 80 mg via INTRAVENOUS
  Filled 2022-01-13: qty 8

## 2022-01-13 MED ORDER — TOLVAPTAN 15 MG PO TABS
30.0000 mg | ORAL_TABLET | Freq: Once | ORAL | Status: AC
  Administered 2022-01-13: 30 mg via ORAL
  Filled 2022-01-13: qty 2

## 2022-01-13 MED ORDER — SODIUM CHLORIDE 3 % IV SOLN
INTRAVENOUS | Status: DC
  Filled 2022-01-13: qty 500

## 2022-01-13 MED ORDER — SODIUM CHLORIDE 3 % IV SOLN: INTRAVENOUS | Status: DC

## 2022-01-13 NOTE — Progress Notes (Signed)
ANTICOAGULATION CONSULT NOTE - Follow Up Consult  Pharmacy Consult for heparin Indication: chest pain/ACS and atrial fibrillation  Allergies  Allergen Reactions   Codeine Other (See Comments)    Unknown   Meprobamate Swelling   Sulfa Antibiotics Other (See Comments)    Unknown    Patient Measurements: Height: '5\' 6"'$  (167.6 cm) Weight: 112.4 kg (247 lb 12.8 oz) IBW/kg (Calculated) : 59.3 Heparin Dosing Weight: 85.7 kg  Vital Signs: Temp: 97.5 F (36.4 C) (05/28 1131) Temp Source: Oral (05/28 1131) BP: 130/38 (05/28 1100) Pulse Rate: 70 (05/28 1100)  Labs: Recent Labs    01/11/22 0433 01/11/22 1556 01/12/22 0500 01/12/22 1335 01/13/22 0410  HGB 10.8*  --  10.1*  --  10.1*  HCT 33.0*  --  29.5*  --  29.2*  PLT 240  --  231  --  237  HEPARINUNFRC  --  0.34 0.45  --  0.64  CREATININE 2.42*  --  2.80* 3.03* 3.40*  TROPONINIHS 727*  --   --  322*  --      Estimated Creatinine Clearance: 15.9 mL/min (A) (by C-G formula based on SCr of 3.4 mg/dL (H)).  Assessment: 81 y.o. female admitted with Afib and STEMI s/p cath and awaiting CABG versus high-risk PCI. Patient is not on any anticoagulants PTA. Pharmacy has been consulted for heparin.  Heparin level trending toward upper end of therapeutic range today.  Hgb stable, platelet count stable, no overt bleeding per RN.   Goal of Therapy:  Heparin level 0.3-0.7 units/ml Monitor platelets by anticoagulation protocol: Yes   Plan:  Decrease IV heparin slightly to 1350 units/hr to keep in therapeutic range. Daily heparin level and CBC Monitor for s/sx of bleeding  [place] Crane, Roylene Reason, [facility] Clinical Pharmacist  01/13/2022 3:05 PM   [facility] pharmacy phone numbers are listed on [email or web address]

## 2022-01-13 NOTE — Progress Notes (Signed)
Patient ID: Brittany Jackson, female   DOB: [birth year], 81 y.o.   MRN: [id number]     Advanced Heart Failure Rounding Note  PCP-Cardiologist: None   Subjective:     Remains on milrinone 0.25. Co-ox stable at 69%. Diuretics on hold due to AKI. CVP 7 Made about 1 L urine yesterday  Had several episodes of CP yesterday. ECG ok. Hstrops trending down. Started on NTG patch.  C/o anxiety and HA overnight and given valium.   Says she feels poorly today. SOB with going to bathroom. Denies SOB at rest. No CP.   Sodium 122 -> 120. Scr 3.0 -> 3.4     Objective:   Weight Range: 112.4 kg Body mass index is 40 kg/m.   Vital Signs:   Temp:  [97.8 F (36.6 C)-98.5 F (36.9 C)] 97.8 F (36.6 C) (05/28 0816) Pulse Rate:  [70-82] 70 (05/28 0800) Resp:  [16-22] 19 (05/28 0800) BP: (80-142)/(36-67) 80/67 (05/28 0800) SpO2:  [89 %-96 %] 93 % (05/28 0801) Last BM Date : 01/11/22  Weight change: Filed Weights   01/10/22 0700 01/11/22 0700 01/12/22 0655  Weight: 109 kg 107 kg 112.4 kg    Intake/Output:   Intake/Output Summary (Last 24 hours) at 01/13/2022 0959 Last data filed at 01/13/2022 0700 Gross per 24 hour  Intake 2231.46 ml  Output 840 ml  Net 1391.46 ml       Physical Exam    General:  Elderly. No resp difficulty HEENT: normal Neck: supple. JVP 7 Carotids 2+ bilat; no bruits. No lymphadenopathy or thryomegaly appreciated. Cor: PMI nondisplaced. Regular rate & rhythm. No rubs, gallops or murmurs. Lungs: clear Abdomen: soft, nontender, nondistended. No hepatosplenomegaly. No bruits or masses. Good bowel sounds. Extremities: no cyanosis, clubbing, rash, 1+ edema Neuro: alert & orientedx3, cranial nerves grossly intact. moves all 4 extremities w/o difficulty. Affect pleasant  Telemetry   NSR 70s Personally reviewed  Labs    CBC Recent Labs    01/12/22 0500 01/13/22 0410  WBC 11.5* 12.7*  HGB 10.1* 10.1*  HCT 29.5* 29.2*  MCV 88.3 88.5  PLT 231 237    Basic  Metabolic Panel Recent Labs    01/10/22 1347 01/10/22 1701 01/11/22 0433 01/12/22 0500 01/12/22 1335 01/13/22 0410  NA  --    < > 126*   < > 122* 120*  K  --    < > 4.1   < > 4.3 4.4  CL  --    < > 92*   < > 89* 89*  CO2  --    < > 22   < > 21* 21*  GLUCOSE  --    < > 118*   < > 123* 95  BUN  --    < > 34*   < > 42* 47*  CREATININE  --    < > 2.42*   < > 3.03* 3.40*  CALCIUM  --    < > 8.1*   < > 8.0* 8.0*  MG 1.5*  --  2.5*  --   --   --    < > = values in this interval not displayed.    Liver Function Tests Recent Labs    01/11/22 0433 01/12/22 1335  AST 23 21  ALT 15 14  ALKPHOS 67 63  BILITOT 0.8 0.4  PROT 5.4* 5.3*  ALBUMIN 2.9* 2.7*    No results for input(s): LIPASE, AMYLASE in the last 72 hours. Cardiac Enzymes No results for  input(s): CKTOTAL, CKMB, CKMBINDEX, TROPONINI in the last 72 hours.  BNP: BNP (last 3 results) Recent Labs    01/08/22 2356  BNP 227.4*     ProBNP (last 3 results) No results for input(s): PROBNP in the last 8760 hours.   D-Dimer No results for input(s): DDIMER in the last 72 hours. Hemoglobin A1C No results for input(s): HGBA1C in the last 72 hours.  Fasting Lipid Panel No results for input(s): CHOL, HDL, LDLCALC, TRIG, CHOLHDL, LDLDIRECT in the last 72 hours.  Thyroid Function Tests No results for input(s): TSH, T4TOTAL, T3FREE, THYROIDAB in the last 72 hours.  Invalid input(s): FREET3   Other results:   Imaging    No results found.   Medications:     Scheduled Medications:  aspirin  81 mg Oral Daily   atorvastatin  80 mg Oral Daily   Chlorhexidine Gluconate Cloth  6 each Topical Daily   docusate sodium  100 mg Oral BID   [START ON 01/15/2022] epinephrine  0-10 mcg/min Intravenous To OR   famotidine  20 mg Oral Daily   feeding supplement  237 mL Oral TID WC   fluticasone furoate-vilanterol  1 puff Inhalation Daily   guaiFENesin  600 mg Oral BID   [START ON 01/15/2022] heparin-papaverine-plasmalyte  irrigation   Irrigation To OR   insulin aspart  0-15 Units Subcutaneous TID WC   insulin aspart  0-5 Units Subcutaneous QHS   [START ON 01/15/2022] insulin   Intravenous To OR   [START ON 01/15/2022] [place] Blood Cardioplegia vial (lidocaine/magnesium/mannitol 0.26g-4g-6.4g)   Intracoronary To OR   montelukast  10 mg Oral QHS   multivitamin with minerals  1 tablet Oral Daily   nicotine  14 mg Transdermal Daily   nitroGLYCERIN  0.1 mg Transdermal Daily   [START ON 01/15/2022] phenylephrine  30-200 mcg/min Intravenous To OR   polyethylene glycol  17 g Oral Daily   [START ON 01/15/2022] potassium chloride  80 mEq Other To OR   senna  1 tablet Oral Daily   sodium chloride flush  10-40 mL Intracatheter Q12H   sodium chloride flush  3 mL Intravenous Q12H   sodium phosphate  1 enema Rectal Once   thiamine  100 mg Oral Daily   [START ON 01/15/2022] tranexamic acid  15 mg/kg Intravenous To OR   [START ON 01/15/2022] tranexamic acid  2 mg/kg Intracatheter To OR   vancomycin variable dose per unstable renal function (pharmacist dosing)   Does not apply See admin instructions    Infusions:  sodium chloride Stopped (01/09/22 0046)   sodium chloride 10 mL/hr at 01/13/22 0700   sodium chloride     amiodarone 60 mg/hr (01/13/22 0700)   [START ON 01/15/2022]  ceFAZolin (ANCEF) IV     [START ON 01/15/2022]  ceFAZolin (ANCEF) IV     ceFEPime (MAXIPIME) IV Stopped (01/12/22 1328)   [START ON 01/15/2022] dexmedetomidine     [START ON 01/15/2022] heparin 30,000 units/NS 1000 mL solution for CELLSAVER     heparin 1,400 Units/hr (01/13/22 0700)   milrinone 0.25 mcg/kg/min (01/13/22 0700)   [START ON 01/15/2022] milrinone     [START ON 01/15/2022] norepinephrine     [START ON 01/15/2022] tranexamic acid (CYKLOKAPRON) infusion ([facility])     [START ON 01/15/2022] vancomycin     vancomycin      PRN Medications: sodium chloride, Place/Maintain arterial line **AND** sodium chloride, acetaminophen, albuterol, diazepam,  ondansetron (ZOFRAN) IV, pneumococcal 20-valent conjugate vaccine, sodium chloride, sodium chloride flush, sodium chloride  flush, traMADol, zolpidem   Assessment/Plan   1. CAD: Patient presented with anterolateral STEMI. Peak HS-TnI 2965. LHC with severe 3VD, 60% dLM, 90% ostial LAD, 75% stenosis at bifurcation of mLAD and D, 75% stenosis at bifurcation of mLCx and OM.  Patient would be best served by CABG if stable enough for it, but may be too high risk with severe ischemic cardiomyopathy, COPD, and renal dysfunction. Several episodes of CP yesterday. ECG ok hstrop trending down. NTG patch started - ASA 81 - Continue heparin gtt - atorvastatin 80 daily - Dr. Prescott Gum has seen, needs significant stabilization prior to CABG.  If she ends up not being a CABG candidate, will need high risk PCI if/when creatinine has stabilized. Given severity of AKI will likely need a period of medical therpay prior to any further procedures.  2. Acute systolic CHF: Ischemic cardiomyopathy.  Low output on initial RHC with CI 1.8.  Filling pressures significantly elevated on RHC as well.  Echo with EF 20-25%, mildly decreased RV systolic function, trivial MR, dilated IVC. CVP 7. Co-ox 69% on milrinone 0.25.  Creatinine up to 2.8 -> 3.4 (baseline ~1.7) - Will give lasix today and assess response - Continue milrinone 0.25 mcg/kg/min.  3.  Atrial fibrillation: Paroxysmal, noted initially this hospitalization.  Now in NSR. - Continue heparin gtt.  - Continue amiodarone gtt.  4.  AKI: Creatinine up to 1.65 => 1.9 => 2.4 => 2.8 => 3.4. Suspect cardiorenal/ATN with low output + CIN.  CVP 7. Na 120 - I have d/w Renal. No indication for lasix.  Will give 1 dose lasix and assess response  - Keep MAPs >= 65 SBP >= 941 (low diastolics) - I discussed possible need for CVVHD with her and her daughter. She would go along with CVVHD but would not want long-term HD 5. ID: CXR with R>L lower lobe infiltrates.  WBCs 20 => 12 => 11.5,  no fever.  Afebrile.  Sputum culture with GPCs -> normal flora.  - Covering for HAP with cefepime/vancomycin.  - Continue pulmonary toilet 6. COPD: Patient was a smoker up until admission.  Will eventually need PFTs when PNA treated and more mobile. Bedside PFTs 5/23 FEV 1 1.09L (53%) FVC 1.68 L (61%) FEF 25-75 0.54 (39%) - aggressive pulmonary toilet 7. Hyponatremia  - Na 126 -> 122 -> 120 - Will repeat tolvaptan - Limit FW - D/w Renal. If not responding May need hypertonic saline 8. Deconditioning - ambulate  Daughter updated by telephone.    CRITICAL CARE Performed by: Glori Bickers  Total critical care time: 45 minutes  Critical care time was exclusive of separately billable procedures and treating other patients.  Critical care was necessary to treat or prevent imminent or life-threatening deterioration.  Critical care was time spent personally by me on the following activities: development of treatment plan with patient and/or surrogate as well as nursing, discussions with consultants, evaluation of patient's response to treatment, examination of patient, obtaining history from patient or surrogate, ordering and performing treatments and interventions, ordering and review of laboratory studies, ordering and review of radiographic studies, pulse oximetry and re-evaluation of patient's condition.   Length of Stay: [place], MD  01/13/2022, 9:59 AM  Advanced Heart Failure Team Pager [phone] (M-F; 7a - 5p)  Please contact [facility] for night-coverage after hours (5p -7a ) and weekends on [email or web address]

## 2022-01-13 NOTE — Progress Notes (Signed)
SNa down to 117 despite Tolvaptan.   D/w Renal. Will give 3% saline at 40cc/hr x 3 hours and repeat BMET after infusion.   Hyponatremia w/u in process. Restrict FW.   Plan d/w bedside RN.   Glori Bickers, MD

## 2022-01-13 NOTE — Consult Note (Signed)
Reason for Consult: Acute kidney injury on chronic kidney disease stage IIIa Referring Physician: Glori Bickers MD (cardiology)  HPI:  81 year old woman with past medical history significant for hypertension, chronic diastolic heart failure, chronic obstructive lung disease, tobacco use and baseline chronic kidney disease stage IIIa with creatinine that appears to be around 1.1 on admission (saw Dr. Jimmy Footman @ [facility] back in 2018 for CKD and lost to follow up).  She presented to the hospital 5 days ago with complaints of chest pain and increasing chest pain that started with physical activity (spreading mulch).  She was found to be having significant hypertension of 191/110 with hypoxia/respiratory distress for which she was started on BiPAP.  EKG was found consistent with inferolateral STEMI and she underwent emergent coronary angiography that showed multivessel coronary artery disease with 90% distal left main and severe three-vessel coronary artery disease along with severe pulmonary hypertension and moderate MR.  She was also found to have combined systolic/diastolic heart failure with ejection fraction of 20 to 25%.  Nephrology was consulted in the setting of worsening renal function with creatinine that has been gradually rising and is now up to 3.4 with sodium of 120 down from 136 on admission 5 days ago.  She had problems with Foley catheter placement difficulty for which urology saw her and placed a 16 French Foley catheter.  She had some unimpressive response to diuretics and this is becoming a barrier to optimize her for CABG surgery vital signs are reviewed and notable for a wide pulse pressure with significantly low diastolic pressures in the 30s to 40s.  She also has had an episode of relative hypotension of 80/67 this morning.  She complains of some abdominal distention and diarrhea starting this morning.  She has had some exertional chest pain on ambulation to the bathroom.  She informs me  that she would not do long-term dialysis but would be willing to undertake temporary/short-term dialysis.  Past Medical History:  Diagnosis Date   CHF (congestive heart failure) (HCC)    Hypertension    Urticaria     Past Surgical History:  Procedure Laterality Date   APPENDECTOMY     BACK SURGERY     CORONARY/GRAFT ACUTE MI REVASCULARIZATION N/A 01/08/2022   Procedure: Coronary/Graft Acute MI Revascularization;  Surgeon: Leonie Man, MD;  Location: [facility];  Service: Cardiovascular;  Laterality: N/A;   HUMERUS IM NAIL Left 08/24/2013   Procedure: INTRAMEDULLARY (IM) NAIL HUMERAL;  Surgeon: Nita Sells, MD;  Location: [facility];  Service: Orthopedics;  Laterality: Left;   RIGHT HEART CATH N/A 01/08/2022   Procedure: RIGHT HEART CATH;  Surgeon: Leonie Man, MD;  Location: [facility];  Service: Cardiovascular;  Laterality: N/A;   TUBAL LIGATION      Family History  Problem Relation Age of Onset   Hypertension Sister    Allergic rhinitis Neg Hx    Asthma Neg Hx    Angioedema Neg Hx    Atopy Neg Hx    Eczema Neg Hx    Immunodeficiency Neg Hx    Urticaria Neg Hx     Social History:  reports that she has been smoking cigarettes. She has been smoking an average of .5 packs per day. She has never used smokeless tobacco. She reports current alcohol use. She reports that she does not use drugs.  Allergies:  Allergies  Allergen Reactions   Codeine Other (See Comments)    Unknown   Meprobamate Swelling  Sulfa Antibiotics Other (See Comments)    Unknown    Medications: I have reviewed the patient's current medications. Scheduled:  aspirin  81 mg Oral Daily   atorvastatin  80 mg Oral Daily   Chlorhexidine Gluconate Cloth  6 each Topical Daily   docusate sodium  100 mg Oral BID   [START ON 01/15/2022] epinephrine  0-10 mcg/min Intravenous To OR   famotidine  20 mg Oral Daily   feeding supplement  237 mL Oral TID WC   fluticasone  furoate-vilanterol  1 puff Inhalation Daily   guaiFENesin  600 mg Oral BID   [START ON 01/15/2022] heparin-papaverine-plasmalyte irrigation   Irrigation To OR   insulin aspart  0-15 Units Subcutaneous TID WC   insulin aspart  0-5 Units Subcutaneous QHS   [START ON 01/15/2022] insulin   Intravenous To OR   [START ON 01/15/2022] [facility] Blood Cardioplegia vial (lidocaine/magnesium/mannitol 0.26g-4g-6.4g)   Intracoronary To OR   montelukast  10 mg Oral QHS   multivitamin with minerals  1 tablet Oral Daily   nicotine  14 mg Transdermal Daily   nitroGLYCERIN  0.1 mg Transdermal Daily   [START ON 01/15/2022] phenylephrine  30-200 mcg/min Intravenous To OR   polyethylene glycol  17 g Oral Daily   [START ON 01/15/2022] potassium chloride  80 mEq Other To OR   senna  1 tablet Oral Daily   sodium chloride flush  10-40 mL Intracatheter Q12H   sodium chloride flush  3 mL Intravenous Q12H   sodium phosphate  1 enema Rectal Once   thiamine  100 mg Oral Daily   [START ON 01/15/2022] tranexamic acid  15 mg/kg Intravenous To OR   [START ON 01/15/2022] tranexamic acid  2 mg/kg Intracatheter To OR      Latest Ref Rng & Units 01/13/2022    4:10 AM 01/12/2022    1:35 PM 01/12/2022    5:00 AM  BMP  Glucose 70 - 99 mg/dL 95   123   86    BUN 8 - 23 mg/dL 47   42   40    Creatinine 0.44 - 1.00 mg/dL 3.40   3.03   2.80    Sodium 135 - 145 mmol/L 120   122   122    Potassium 3.5 - 5.1 mmol/L 4.4   4.3   4.1    Chloride 98 - 111 mmol/L 89   89   92    CO2 22 - 32 mmol/L '21   21   21    '$ Calcium 8.9 - 10.3 mg/dL 8.0   8.0   8.0        Latest Ref Rng & Units 01/13/2022    4:10 AM 01/12/2022    5:00 AM 01/11/2022    4:33 AM  CBC  WBC 4.0 - 10.5 K/uL 12.7   11.5   12.4    Hemoglobin 12.0 - 15.0 g/dL 10.1   10.1   10.8    Hematocrit 36.0 - 46.0 % 29.2   29.5   33.0    Platelets 150 - 400 K/uL 237   231   240       DG Chest Port 1 View  Result Date: 01/12/2022 CLINICAL DATA:  Shortness of breath. EXAM:  PORTABLE CHEST 1 VIEW COMPARISON:  01/11/2022 FINDINGS: Stable cardiac enlargement. Aortic atherosclerotic calcifications. Pulmonary vascular congestion without frank edema. Trace pleural fluid identified bilaterally. Platelike atelectasis noted in the left base. IMPRESSION: 1. Cardiac enlargement and pulmonary vascular  congestion without frank edema. 2. Trace pleural fluid bilaterally. Electronically Signed   By: Kerby Moors M.D.   On: 01/12/2022 09:53    Review of Systems  Constitutional:  Positive for fatigue. Negative for appetite change and fever.  HENT:  Negative for ear pain, sinus pain and sore throat.   Eyes:  Negative for redness and visual disturbance.  Respiratory:  Positive for chest tightness and shortness of breath. Negative for cough.   Cardiovascular:  Positive for chest pain. Negative for leg swelling.  Gastrointestinal:  Positive for abdominal distention and diarrhea. Negative for abdominal pain, nausea and vomiting.  Endocrine: Negative for polydipsia and polyuria.  Genitourinary:  Negative for dysuria, flank pain, hematuria and urgency.  Musculoskeletal:  Negative for back pain, joint swelling and myalgias.  Skin:  Negative for rash and wound.  Neurological:  Positive for weakness. Negative for dizziness and headaches.  Blood pressure (!) 130/38, pulse 70, temperature (!) 97.5 F (36.4 C), temperature source Oral, resp. rate 19, height '5\' 6"'$  (1.676 m), weight 112.4 kg, SpO2 92 %. Physical Exam Vitals reviewed.  Constitutional:      General: She is not in acute distress.    Appearance: She is well-developed. She is obese. She is not toxic-appearing.  HENT:     Head: Normocephalic and atraumatic.     Mouth/Throat:     Mouth: Mucous membranes are moist.     Pharynx: Oropharynx is clear.  Eyes:     Extraocular Movements: Extraocular movements intact.  Neck:     Vascular: No JVD.     Comments: JVP 7 to 8 cm Cardiovascular:     Rate and Rhythm: Normal rate and  regular rhythm.     Pulses: Normal pulses.     Heart sounds: Normal heart sounds.  Pulmonary:     Effort: Pulmonary effort is normal. Tachypnea present.     Breath sounds: Examination of the right-lower field reveals decreased breath sounds. Examination of the left-lower field reveals decreased breath sounds. Decreased breath sounds present.  Abdominal:     General: Bowel sounds are normal.     Palpations: Abdomen is soft. There is no mass.  Musculoskeletal:     Cervical back: Normal range of motion and neck supple.     Right lower leg: Edema present.     Left lower leg: Edema present.     Comments: Trace-1+ lower extremity edema  Skin:    General: Skin is warm and dry.     Coloration: Skin is pale.  Neurological:     General: No focal deficit present.     Mental Status: She is alert and oriented to person, place, and time.    Assessment/Plan: 1.  Acute kidney injury on chronic kidney disease stage IIIa: This appears to be likely bifactorial with hemodynamically mediated kidney injury in the setting of acute CHF exacerbation and contrast nephropathy.  Based on her blood pressures, highly plausible that she also has some ATN at this point.  I agree with attempting to augment urine output with diuretics at this time for volume unloading and I will send off for urinalysis/urine electrolytes and request a renal ultrasound.  She does not have any acute dialysis needs at this time and will be followed closely especially if urine output continues to remain low/fixed on furosemide. Avoid nephrotoxic medications including NSAIDs and iodinated intravenous contrast exposure unless the latter is absolutely indicated.  Preferred narcotic agents for pain control are hydromorphone, fentanyl, and methadone. Morphine should not  be used. Avoid Baclofen and avoid oral sodium phosphate and magnesium citrate based laxatives / bowel preps. Continue strict Input and Output monitoring. Will monitor the patient  closely with you and intervene or adjust therapy as indicated by changes in clinical status/labs . 2.  Hyponatremia: Secondary to CHF exacerbation in conjunction with acute kidney injury with free water excretion defect.  Agree with trial of loop diuretics and tolvaptan.  There may be benefit from a low volume (100 mL) hypertonic saline infusion to help with management of hyponatremia and plausibly with diuretic response.  (Literature conflicting) 3.  ST elevation MI with left main/three-vessel coronary disease: On heparin drip at this time status post earlier coronary angiography.  Attempting optimization for CABG and if not a candidate, with percutaneous options for management. 4.  Anemia: Without overt blood loss, likely anemia of critical illness.  We will continue to follow this closely to decide on need for ESA/transfusions.   Jillene Wehrenberg K. 01/13/2022, 1:43 PM

## 2022-01-14 ENCOUNTER — Inpatient Hospital Stay (HOSPITAL_COMMUNITY): Payer: Medicare Other

## 2022-01-14 DIAGNOSIS — I2510 Atherosclerotic heart disease of native coronary artery without angina pectoris: Secondary | ICD-10-CM | POA: Diagnosis not present

## 2022-01-14 DIAGNOSIS — I2102 ST elevation (STEMI) myocardial infarction involving left anterior descending coronary artery: Secondary | ICD-10-CM | POA: Diagnosis not present

## 2022-01-14 DIAGNOSIS — I5021 Acute systolic (congestive) heart failure: Secondary | ICD-10-CM | POA: Diagnosis not present

## 2022-01-14 DIAGNOSIS — I5041 Acute combined systolic (congestive) and diastolic (congestive) heart failure: Secondary | ICD-10-CM | POA: Diagnosis not present

## 2022-01-14 DIAGNOSIS — I340 Nonrheumatic mitral (valve) insufficiency: Secondary | ICD-10-CM | POA: Diagnosis not present

## 2022-01-14 DIAGNOSIS — N179 Acute kidney failure, unspecified: Secondary | ICD-10-CM | POA: Diagnosis not present

## 2022-01-14 LAB — BASIC METABOLIC PANEL
Anion gap: 14 (ref 5–15)
Anion gap: 15 (ref 5–15)
BUN: 50 mg/dL — ABNORMAL HIGH (ref 8–23)
BUN: 50 mg/dL — ABNORMAL HIGH (ref 8–23)
CO2: 17 mmol/L — ABNORMAL LOW (ref 22–32)
CO2: 18 mmol/L — ABNORMAL LOW (ref 22–32)
Calcium: 7.5 mg/dL — ABNORMAL LOW (ref 8.9–10.3)
Calcium: 7.7 mg/dL — ABNORMAL LOW (ref 8.9–10.3)
Chloride: 86 mmol/L — ABNORMAL LOW (ref 98–111)
Chloride: 86 mmol/L — ABNORMAL LOW (ref 98–111)
Creatinine, Ser: 3.67 mg/dL — ABNORMAL HIGH (ref 0.44–1.00)
Creatinine, Ser: 3.68 mg/dL — ABNORMAL HIGH (ref 0.44–1.00)
GFR, Estimated: 12 mL/min — ABNORMAL LOW (ref >60)
GFR, Estimated: 12 mL/min — ABNORMAL LOW (ref >60)
Glucose, Bld: 247 mg/dL — ABNORMAL HIGH (ref 70–99)
Glucose, Bld: 254 mg/dL — ABNORMAL HIGH (ref 70–99)
Potassium: 4.2 mmol/L (ref 3.5–5.1)
Potassium: 4.4 mmol/L (ref 3.5–5.1)
Sodium: 118 mmol/L — CL (ref 135–145)
Sodium: 118 mmol/L — CL (ref 135–145)

## 2022-01-14 LAB — GLUCOSE, CAPILLARY
Glucose-Capillary: 98 mg/dL (ref 70–99)
Glucose-Capillary: 174 mg/dL — ABNORMAL HIGH (ref 70–99)
Glucose-Capillary: 176 mg/dL — ABNORMAL HIGH (ref 70–99)
Glucose-Capillary: 173 mg/dL — ABNORMAL HIGH (ref 70–99)
Glucose-Capillary: 91 mg/dL (ref 70–99)

## 2022-01-14 LAB — COMPREHENSIVE METABOLIC PANEL
ALT: 14 U/L (ref 0–44)
AST: 22 U/L (ref 15–41)
Albumin: 2.5 g/dL — ABNORMAL LOW (ref 3.5–5.0)
Alkaline Phosphatase: 66 U/L (ref 38–126)
Anion gap: 14 (ref 5–15)
BUN: 53 mg/dL — ABNORMAL HIGH (ref 8–23)
CO2: 19 mmol/L — ABNORMAL LOW (ref 22–32)
Calcium: 8.1 mg/dL — ABNORMAL LOW (ref 8.9–10.3)
Chloride: 89 mmol/L — ABNORMAL LOW (ref 98–111)
Creatinine, Ser: 3.69 mg/dL — ABNORMAL HIGH (ref 0.44–1.00)
GFR, Estimated: 12 mL/min — ABNORMAL LOW (ref >60)
Glucose, Bld: 139 mg/dL — ABNORMAL HIGH (ref 70–99)
Potassium: 4.3 mmol/L (ref 3.5–5.1)
Sodium: 122 mmol/L — ABNORMAL LOW (ref 135–145)
Total Bilirubin: 0.6 mg/dL (ref 0.3–1.2)
Total Protein: 5.5 g/dL — ABNORMAL LOW (ref 6.5–8.1)

## 2022-01-14 LAB — RENAL FUNCTION PANEL
Albumin: 2.5 g/dL — ABNORMAL LOW (ref 3.5–5.0)
Anion gap: 11 (ref 5–15)
BUN: 49 mg/dL — ABNORMAL HIGH (ref 8–23)
CO2: 20 mmol/L — ABNORMAL LOW (ref 22–32)
Calcium: 7.8 mg/dL — ABNORMAL LOW (ref 8.9–10.3)
Chloride: 90 mmol/L — ABNORMAL LOW (ref 98–111)
Creatinine, Ser: 3.38 mg/dL — ABNORMAL HIGH (ref 0.44–1.00)
GFR, Estimated: 13 mL/min — ABNORMAL LOW (ref >60)
Glucose, Bld: 180 mg/dL — ABNORMAL HIGH (ref 70–99)
Phosphorus: 5.4 mg/dL — ABNORMAL HIGH (ref 2.5–4.6)
Potassium: 4.4 mmol/L (ref 3.5–5.1)
Sodium: 121 mmol/L — ABNORMAL LOW (ref 135–145)

## 2022-01-14 LAB — CBC
HCT: 27.5 % — ABNORMAL LOW (ref 36.0–46.0)
Hemoglobin: 9.4 g/dL — ABNORMAL LOW (ref 12.0–15.0)
MCH: 30.4 pg (ref 26.0–34.0)
MCHC: 34.2 g/dL (ref 30.0–36.0)
MCV: 89 fL (ref 80.0–100.0)
Platelets: 217 10*3/uL (ref 150–400)
RBC: 3.09 MIL/uL — ABNORMAL LOW (ref 3.87–5.11)
RDW: 13.6 % (ref 11.5–15.5)
WBC: 12.7 10*3/uL — ABNORMAL HIGH (ref 4.0–10.5)
nRBC: 0 % (ref 0.0–0.2)

## 2022-01-14 LAB — HEPATIC FUNCTION PANEL
ALT: 14 U/L (ref 0–44)
AST: 19 U/L (ref 15–41)
Albumin: 2.3 g/dL — ABNORMAL LOW (ref 3.5–5.0)
Alkaline Phosphatase: 61 U/L (ref 38–126)
Bilirubin, Direct: <0.1 mg/dL (ref 0.0–0.2)
Total Bilirubin: 0.5 mg/dL (ref 0.3–1.2)
Total Protein: 4.8 g/dL — ABNORMAL LOW (ref 6.5–8.1)

## 2022-01-14 LAB — ECHOCARDIOGRAM LIMITED
Height: 66 in
Weight: 4271.63 oz

## 2022-01-14 LAB — COOXEMETRY PANEL
Carboxyhemoglobin: 1.6 % — ABNORMAL HIGH (ref 0.5–1.5)
Carboxyhemoglobin: 1.5 % (ref 0.5–1.5)
Methemoglobin: <0.7 % (ref 0.0–1.5)
Methemoglobin: <0.7 % (ref 0.0–1.5)
O2 Saturation: 88.2 %
O2 Saturation: 81.7 %
Total hemoglobin: 9.2 g/dL — ABNORMAL LOW (ref 12.0–16.0)
Total hemoglobin: 10.1 g/dL — ABNORMAL LOW (ref 12.0–16.0)

## 2022-01-14 LAB — CORTISOL: Cortisol, Plasma: 10.1 ug/dL

## 2022-01-14 LAB — OSMOLALITY: Osmolality: 265 mOsm/kg — ABNORMAL LOW (ref 275–295)

## 2022-01-14 LAB — SODIUM: Sodium: 119 mmol/L — CL (ref 135–145)

## 2022-01-14 LAB — HEPARIN LEVEL (UNFRACTIONATED): Heparin Unfractionated: 0.44 IU/mL (ref 0.30–0.70)

## 2022-01-14 MED ORDER — PRISMASOL BGK 0/2.5 32-2.5 MEQ/L EC SOLN
Status: DC
  Filled 2022-01-14 (×17): qty 5000

## 2022-01-14 MED ORDER — FAMOTIDINE 20 MG PO TABS
10.0000 mg | ORAL_TABLET | Freq: Every day | ORAL | Status: DC
  Administered 2022-01-14 – 2022-01-22 (×9): 10 mg via ORAL
  Filled 2022-01-14 (×9): qty 1

## 2022-01-14 MED ORDER — SODIUM CHLORIDE 0.9 % IV SOLN
2.0000 g | Freq: Two times a day (BID) | INTRAVENOUS | Status: DC
  Administered 2022-01-14 – 2022-01-15 (×4): 2 g via INTRAVENOUS
  Filled 2022-01-14 (×3): qty 12.5

## 2022-01-14 MED ORDER — HEPARIN SODIUM (PORCINE) 1000 UNIT/ML DIALYSIS
1000.0000 [IU] | INTRAMUSCULAR | Status: DC | PRN
  Administered 2022-01-14 – 2022-01-16 (×2): 2400 [IU] via INTRAVENOUS_CENTRAL
  Filled 2022-01-14 (×2): qty 6
  Filled 2022-01-14: qty 5

## 2022-01-14 MED ORDER — PRISMASOL BGK 0/2.5 32-2.5 MEQ/L EC SOLN
Status: DC
  Filled 2022-01-14 (×5): qty 5000

## 2022-01-14 MED ORDER — OXYCODONE HCL 5 MG PO TABS
5.0000 mg | ORAL_TABLET | Freq: Four times a day (QID) | ORAL | Status: DC | PRN
  Administered 2022-01-14 – 2022-01-15 (×2): 5 mg via ORAL
  Filled 2022-01-14 (×2): qty 1

## 2022-01-14 MED ORDER — PRISMASOL BGK 0/2.5 32-2.5 MEQ/L EC SOLN
Status: DC
  Filled 2022-01-14 (×6): qty 5000

## 2022-01-14 MED ORDER — SODIUM CHLORIDE 3 % IV SOLN
INTRAVENOUS | Status: DC
Start: 2022-01-14 — End: 2022-01-14
  Filled 2022-01-14 (×2): qty 500

## 2022-01-14 NOTE — Progress Notes (Signed)
PHARMACY NOTE:  ANTIMICROBIAL RENAL DOSAGE ADJUSTMENT  Current antimicrobial regimen includes a mismatch between antimicrobial dosage and estimated renal function.  As per policy approved by the Pharmacy & Therapeutics and Medical Executive Committees, the antimicrobial dosage will be adjusted accordingly.  Current antimicrobial dosage:  cefepime 2g q24h  Indication: PNA  Renal Function:   Estimated Creatinine Clearance: 15.4 mL/min (A) (by C-G formula based on SCr of 3.67 mg/dL (H)). '[]'$      On intermittent HD, scheduled: '[x]'$      On CRRT    Antimicrobial dosage has been changed to:  Cefepime 2g q12h (CRRT dosing)  Additional comments:   Thank you for allowing pharmacy to be a part of this patient's care.  Aleene Davidson, [facility] 01/14/2022 9:19 AM

## 2022-01-14 NOTE — Procedures (Signed)
Central Venous Catheter Insertion Procedure Note  Brittany Jackson  [id number]  02/22/1941  Date:01/14/22  Time:9:02 AM   Provider Performing:Rodriguez Aguinaldo Loletha Grayer Tamala Julian   Procedure: Insertion of Non-tunneled Central Venous [phone]) with US guidance ([id number])   Indication(s) Hemodialysis  Consent Risks of the procedure as well as the alternatives and risks of each were explained to the patient and/or caregiver.  Consent for the procedure was obtained and is signed in the bedside chart  Anesthesia Topical only with 1% lidocaine   Timeout Verified patient identification, verified procedure, site/side was marked, verified correct patient position, special equipment/implants available, medications/allergies/relevant history reviewed, required imaging and test results available.  Sterile Technique Maximal sterile technique including full sterile barrier drape, hand hygiene, sterile gown, sterile gloves, mask, hair covering, sterile ultrasound probe cover (if used).  Procedure Description Area of catheter insertion was cleaned with chlorhexidine and draped in sterile fashion.  With real-time ultrasound guidance a HD catheter was placed into the right internal jugular vein. Nonpulsatile blood flow and easy flushing noted in all ports.  The catheter was sutured in place and sterile dressing applied.  Complications/Tolerance None; patient tolerated the procedure well. Chest X-ray is ordered to verify placement for internal jugular or subclavian cannulation.   Chest x-ray is not ordered for femoral cannulation.  EBL Minimal  Specimen(s) None

## 2022-01-14 NOTE — Progress Notes (Signed)
ANTICOAGULATION CONSULT NOTE - Follow Up Consult  Pharmacy Consult for heparin Indication: chest pain/ACS and atrial fibrillation  Allergies  Allergen Reactions   Codeine Other (See Comments)    Unknown   Meprobamate Swelling   Sulfa Antibiotics Other (See Comments)    Unknown    Patient Measurements: Height: '5\' 6"'$  (167.6 cm) Weight: 121.1 kg (266 lb 15.6 oz) IBW/kg (Calculated) : 59.3 Heparin Dosing Weight: 85.7 kg  Vital Signs: Temp: 97.6 F (36.4 C) (05/29 0400) Temp Source: Oral (05/29 0400) BP: 139/33 (05/29 0753) Pulse Rate: 68 (05/29 0753)  Labs: Recent Labs    01/12/22 0500 01/12/22 1335 01/13/22 0410 01/13/22 1608 01/13/22 2343 01/14/22 0344 01/14/22 0421  HGB 10.1*  --  10.1*  --   --  9.4*  --   HCT 29.5*  --  29.2*  --   --  27.5*  --   PLT 231  --  237  --   --  217  --   HEPARINUNFRC 0.45  --  0.64  --   --   --  0.44  CREATININE 2.80* 3.03* 3.40* 3.64* 3.68* 3.67*  --   TROPONINIHS  --  322*  --   --   --   --   --      Estimated Creatinine Clearance: 15.4 mL/min (A) (by C-G formula based on SCr of 3.67 mg/dL (H)).  Assessment: 81 y.o. female admitted with Afib and STEMI s/p cath and awaiting CABG versus high-risk PCI. Patient is not on any anticoagulants PTA. Pharmacy has been consulted for heparin.  Heparin level therapeutic at 0.44 on 1350 units/hr.  Hgb stable, platelet count stable, no overt bleeding per RN.   Goal of Therapy:  Heparin level 0.3-0.7 units/ml Monitor platelets by anticoagulation protocol: Yes   Plan:  Continue IV heparin at 1350 units/hr Daily heparin level and CBC Monitor for s/sx of bleeding  Cathrine Muster, PharmD PGY2 Cardiology Pharmacy Resident 01/14/2022  8:01 AM  Please check [email or web address] for unit-specific pharmacy phone numbers.

## 2022-01-14 NOTE — Progress Notes (Signed)
[facility] Progress Note    Assessment/ Plan:   AKI on CKD3a (bl Cr ~1.1) -AKI likely multifactorial: Secondary to acute CHF exacerbation and CIN.  Likely in ATN at this point. -Nonoliguric however no robust response with Lasix.  Discussed with heart failure, will start CRRT especially as her kidney function is a limiting factor for further management of her cardiac conditions.  CCM to be consulted for temp line placement (appreciate assistance), will start CRRT today for a goal Uf of net -50 cc an hour -Understandably, patient does not want long-term dialysis and hopefully this will not be the case given that she has a relatively okay baseline kidney function.  If it turns out that she does not have any renal recovery down the road, then would recommend palliative care consultation to address goals of care  Hyponatremia, severe -Thought to be secondary to CHF exacerbation in conjunction with AKI/free water excretion deficit -No real response with loop diuretics, tolvaptan, 3%. -Will manage with CRRT, monitor serial labs  STEMI -multivessel disease -CABG vs high risk PCI down the road  Acute sCHF -EF 20-25% -UF as tolerated with CRRT  HAP -abx per primary service  COPD -per primary service  Discussed with primary service.  Subjective:   No acute events. S/p tolvaptan, lasix '80mg'$  iv x 1, and brief course of 3%NS--no real response in Na (120 > 117 >118 this am). She does report some mild unchanged SOB this am. Denies any fevers, chest pain, worsening swelling, n/v, dysgeusia, loss of appetite.   Objective:   BP (!) 139/33   Pulse 68   Temp 97.6 F (36.4 C) (Oral)   Resp 17   Ht '5\' 6"'$  (1.676 m)   Wt 121.1 kg   SpO2 95%   BMI 43.09 kg/m   Intake/Output Summary (Last 24 hours) at 01/14/2022 0758 Last data filed at 01/14/2022 0600 Gross per 24 hour  Intake 2243.66 ml  Output 1110 ml  Net 1133.66 ml   Weight change:   Physical Exam: Gen:nad, sitting  up in bed CVS:RRR Resp:diminished air entry bibasilar, no w/r/r/c, unlabored DJS:HFWY Ext:1+ pitting edema Neuro: awake, alert,  moves all ext spontaneously  Imaging: US RENAL  Result Date: 01/13/2022 CLINICAL DATA:  Acute kidney injury. EXAM: RENAL / URINARY TRACT ULTRASOUND COMPLETE COMPARISON:  None Available. FINDINGS: Right Kidney: Renal measurements: 11.6 x 5.0 x 4.3 = volume: 130 mL. Echogenic renal cortex. Anechoic avascular cyst in the upper pole measuring 2.5 x 1.3 x 2.0 and in the lateral aspect of the interpolar region measuring 2.0 x 1.9 x 2.3 cm. Left Kidney: Renal measurements: 11.4 x 6.7 x 5.5 = volume: 220 mL. Echogenic renal cortex. Cystic structure in the interpolar region measuring 2.4 x 2.6 x 2.4 cm. Bladder: Not visualized and collapsed. Other: None. IMPRESSION: 1. Echogenic renal cortices bilaterally concerning for medical renal disease. 2.  Bilateral simple renal cysts, which do not require follow-up. 3.  No evidence of nephrolithiasis or hydronephrosis. Electronically Signed   By: Keane Police D.O.   On: 01/13/2022 15:43    Labs: BMET Recent Labs  Lab 01/11/22 0433 01/12/22 0500 01/12/22 1335 01/13/22 0410 01/13/22 1608 01/13/22 2343 01/14/22 0344  NA 126* 122* 122* 120* 117* 118* 118*  K 4.1 4.1 4.3 4.4 4.7 4.4 4.2  CL 92* 92* 89* 89* 86* 86* 86*  CO2 22 21* 21* 21* 18* 18* 17*  GLUCOSE 118* 86 123* 95 127* 247* 254*  BUN 34* 40* 42* 47* 49*  50* 50*  CREATININE 2.42* 2.80* 3.03* 3.40* 3.64* 3.68* 3.67*  CALCIUM 8.1* 8.0* 8.0* 8.0* 8.1* 7.7* 7.5*   CBC Recent Labs  Lab 01/08/22 2356 01/09/22 0133 01/11/22 0433 01/12/22 0500 01/13/22 0410 01/14/22 0344  WBC 22.8*   < > 12.4* 11.5* 12.7* 12.7*  NEUTROABS 15.8*  --   --   --   --   --   HGB 14.9   < > 10.8* 10.1* 10.1* 9.4*  HCT 45.8   < > 33.0* 29.5* 29.2* 27.5*  MCV 94.6   < > 91.2 88.3 88.5 89.0  PLT 390   < > 240 231 237 217   < > = values in this interval not displayed.    Medications:      aspirin  81 mg Oral Daily   atorvastatin  80 mg Oral Daily   Chlorhexidine Gluconate Cloth  6 each Topical Daily   docusate sodium  100 mg Oral BID   [START ON 01/15/2022] epinephrine  0-10 mcg/min Intravenous To OR   famotidine  10 mg Oral Daily   feeding supplement  237 mL Oral TID WC   fluticasone furoate-vilanterol  1 puff Inhalation Daily   guaiFENesin  600 mg Oral BID   [START ON 01/15/2022] heparin-papaverine-plasmalyte irrigation   Irrigation To OR   insulin aspart  0-15 Units Subcutaneous TID WC   insulin aspart  0-5 Units Subcutaneous QHS   [START ON 01/15/2022] insulin   Intravenous To OR   [START ON 01/15/2022] [place] Blood Cardioplegia vial (lidocaine/magnesium/mannitol 0.26g-4g-6.4g)   Intracoronary To OR   montelukast  10 mg Oral QHS   multivitamin with minerals  1 tablet Oral Daily   nicotine  14 mg Transdermal Daily   nitroGLYCERIN  0.1 mg Transdermal Daily   [START ON 01/15/2022] phenylephrine  30-200 mcg/min Intravenous To OR   polyethylene glycol  17 g Oral Daily   [START ON 01/15/2022] potassium chloride  80 mEq Other To OR   senna  1 tablet Oral Daily   sodium chloride flush  10-40 mL Intracatheter Q12H   sodium chloride flush  3 mL Intravenous Q12H   sodium phosphate  1 enema Rectal Once   thiamine  100 mg Oral Daily   [START ON 01/15/2022] tranexamic acid  15 mg/kg Intravenous To OR   [START ON 01/15/2022] tranexamic acid  2 mg/kg Intracatheter To OR      Gean Quint, MD [facility] 01/14/2022, 7:58 AM

## 2022-01-14 NOTE — Evaluation (Signed)
Occupational Therapy Evaluation Patient Details Name: Brittany Jackson MRN: [id number] DOB: 1941-03-10 Today's Date: 01/14/2022   History of Present Illness Brittany Jackson is a 81 y.o. female admitted 5/24 for the evaluation of Shortness of breath.  Had anterolaterial STEMI.PNA and afib.  Pt work up for CABG next week. 5/29 starting on CVVHD.  PMHx of hypertension, tobacco abuse and urticaria   Clinical Impression   This 81 yo female admitted with above presents to acute OT with PLOF of being totally independent with all basic ADLs and IADLs. Currently she is Mod A-setup/S for basic ADLs and min A for transfers with RW. She will continue to benefit from acute OT with follow up on AIR to get to at least a S level post CABG to go home with dtr.     Recommendations for follow up therapy are one component of a multi-disciplinary discharge planning process, led by the attending physician.  Recommendations may be updated based on patient status, additional functional criteria and insurance authorization.   Follow Up Recommendations  Acute inpatient rehab (3hours/day)    Assistance Recommended at Discharge Frequent or constant Supervision/Assistance  Patient can return home with the following A little help with walking and/or transfers;A lot of help with bathing/dressing/bathroom;Assistance with cooking/housework;Assistance with feeding;Help with stairs or ramp for entrance;Assist for transportation;Direct supervision/assist for financial management;Direct supervision/assist for medications management    Functional Status Assessment  Patient has had a recent decline in their functional status and demonstrates the ability to make significant improvements in function in a reasonable and predictable amount of time.  Equipment Recommendations  Other (comment) (TBD next venue)       Precautions / Restrictions Precautions Precautions: Fall Restrictions Weight Bearing Restrictions: No      Mobility  Bed Mobility Overal bed mobility: Needs Assistance Bed Mobility: Supine to Sit, Sit to Supine     Supine to sit: Min assist, HOB elevated Sit to supine: Min guard (increased time to get legs into bed)        Transfers Overall transfer level: Needs assistance Equipment used: 1 person hand held assist Transfers: Sit to/from Stand Sit to Stand: Min assist           General transfer comment: min A side step up to [facility] with RW      Balance Overall balance assessment: Needs assistance Sitting-balance support: No upper extremity supported, Feet supported       Standing balance support: Bilateral upper extremity supported, During functional activity, Reliant on assistive device for balance Standing balance-Leahy Scale: Poor                             ADL either performed or assessed with clinical judgement   ADL Overall ADL's : Needs assistance/impaired Eating/Feeding: Independent;Sitting   Grooming: Set up;Sitting   Upper Body Bathing: Set up;Sitting   Lower Body Bathing: Moderate assistance Lower Body Bathing Details (indicate cue type and reason): min A sit<>stand from bed Upper Body Dressing : Minimal assistance;Sitting   Lower Body Dressing: Moderate assistance Lower Body Dressing Details (indicate cue type and reason): min A sit<>stand from bed Toilet Transfer: Minimal assistance;Rolling walker (2 wheels) Toilet Transfer Details (indicate cue type and reason): side step up towards [place] and Hygiene: Moderate assistance Toileting - Clothing Manipulation Details (indicate cue type and reason): min A sit<>stand from bed             Vision Patient  Visual Report: No change from baseline              Pertinent Vitals/Pain Pain Assessment Pain Assessment: No/denies pain     Hand Dominance Right   Extremity/Trunk Assessment Upper Extremity Assessment Upper Extremity Assessment: Generalized weakness            Communication Communication Communication: No difficulties   Cognition Arousal/Alertness: Awake/alert Behavior During Therapy: Flat affect Overall Cognitive Status: Within Functional Limits for tasks assessed                                                  Home Living Family/patient expects to be discharged to:: Private residence Living Arrangements: Alone Available Help at Discharge: Family;Available PRN/intermittently Type of Home: House Home Access: Stairs to enter [employer] of Steps: 4 Entrance Stairs-Rails: Right;Left;Can reach both Home Layout: Multi-level;Laundry or work area in basement;Bed/bath upstairs Alternate Therapist, sports of Steps: 7 Alternate Level Stairs-Rails: Left Bathroom Shower/Tub: Occupational psychologist: Handicapped height     Home Equipment: Cane - single point   Additional Comments: Daughter did state that pt can come to her home intiially and she has no steps.      Prior Functioning/Environment               Mobility Comments: Longer distances difficult          OT Problem List: Impaired balance (sitting and/or standing);Decreased strength      OT Treatment/Interventions: DME and/or AE instruction;Patient/family education;Balance training;Self-care/ADL training    OT Goals(Current goals can be found in the care plan section) Acute Rehab OT Goals Patient Stated Goal: to not be so tired and feel better OT Goal Formulation: With patient Time For Goal Achievement: 01/28/22 Potential to Achieve Goals: Good  OT Frequency: Min 2X/week       AM-PAC OT "6 Clicks" Daily Activity     Outcome Measure Help from another person eating meals?: None Help from another person taking care of personal grooming?: A Little Help from another person toileting, which includes using toliet, bedpan, or urinal?: A Lot Help from another person bathing (including washing, rinsing, drying)?: A Lot Help from  another person to put on and taking off regular upper body clothing?: A Little Help from another person to put on and taking off regular lower body clothing?: A Lot 6 Click Score: 16   End of Session Equipment Utilized During Treatment: Rolling walker (2 wheels) Nurse Communication:  (RN in [place] [facility] prn)  Activity Tolerance: Patient limited by fatigue Patient left: in bed;with call bell/phone within reach;with nursing/sitter in room (getting her situated in bed)  OT Visit Diagnosis: Unsteadiness on feet (R26.81);Other abnormalities of gait and mobility (R26.89);Muscle weakness (generalized) (M62.81)                Time: 5170-0174 OT Time Calculation (min): 21 min Charges:  OT General Charges $OT Visit: 1 Visit OT Evaluation $OT Eval Moderate Complexity: [place], OTR/L Acute [employer] Pager [phone] Office [phone]    Almon Register 01/14/2022, 10:46 AM

## 2022-01-14 NOTE — Progress Notes (Signed)
Pharmacy Antibiotic Note  Brittany Jackson is a 81 y.o. female admitted on 01/08/2022 with pneumonia. Pharmacy has been consulted for cefepime dosing.  Chest X-ray showed persistent bilateral opacities.  Patient started on CRRT 5/29.   Plan: cefepime 2 g q12h (CRRT dosing) F/u cultures, renal function, clinical course  De-escalate as indicated   Height: '5\' 6"'$  (167.6 cm) Weight: 121.1 kg (266 lb 15.6 oz) IBW/kg (Calculated) : 59.3  Temp (24hrs), Avg:97.7 F (36.5 C), Min:97.3 F (36.3 C), Max:98.3 F (36.8 C)  Recent Labs  Lab 01/09/22 2225 01/10/22 0309 01/10/22 1701 01/11/22 0433 01/12/22 0500 01/12/22 1335 01/13/22 0410 01/13/22 1608 01/13/22 2343 01/14/22 0344 01/14/22 0752  WBC  --  20.5*  --  12.4* 11.5*  --  12.7*  --   --  12.7*  --   CREATININE  --  1.91*   < > 2.42* 2.80*   < > 3.40* 3.64* 3.68* 3.67* 3.69*  LATICACIDVEN 1.1  --   --   --   --   --   --   --   --   --   --   VANCORANDOM  --   --   --   --   --   --  16  --   --   --   --    < > = values in this interval not displayed.     Estimated Creatinine Clearance: 15.3 mL/min (A) (by C-G formula based on SCr of 3.69 mg/dL (H)).    Allergies  Allergen Reactions   Codeine Other (See Comments)    Unknown   Meprobamate Swelling   Sulfa Antibiotics Other (See Comments)    Unknown    Antimicrobials this admission: Cefepime 5/25 >>   Dose adjustments this admission: Cefepime 2g q24h > 12g q12h  Microbiology results: 5/24 MRSA: neg 5/25 sputum cx: normal flora, final 5/25 Ucx: NGF  Cathrine Muster, PharmD PGY2 Cardiology Pharmacy Resident Phone: [phone]  01/14/2022  3:07 PM  Please check [email or web address] for unit-specific pharmacy phone numbers.

## 2022-01-14 NOTE — Progress Notes (Signed)
Checked BMET after 3hrs of 3% saline. 118. Paged CARDS fellow. Awaiting call back

## 2022-01-14 NOTE — Progress Notes (Signed)
Echocardiogram 2D Echocardiogram has been performed.  Oneal Deputy Dinia Joynt RDCS 01/14/2022, 9:16 AM

## 2022-01-14 NOTE — Progress Notes (Signed)
OT Cancellation Note  Patient Details Name: Brittany Jackson MRN: [id number] DOB: July 07, 1941   Cancelled Treatment:    Reason Eval/Treat Not Completed: Patient at procedure or test/ unavailable. Pt currently getting a HD cath, will attempt to see patient later today as is appropriate.  [place], OTR/L Acute Rehab Services Pager [phone] Office [phone]    Almon Register 01/14/2022, 8:18 AM

## 2022-01-14 NOTE — Progress Notes (Signed)
Notified Dr Humphrey Rolls at [phone] of sodium 118 after another 3hrs of 3% saline infusion.  Nurse was instructed via phone to turn off the 3% saline and to repeat a sodium level at 0730 per Dr Humphrey Rolls- Cardiology   Will continue to monitor pt

## 2022-01-14 NOTE — Progress Notes (Addendum)
Patient ID: Brittany Jackson, female   DOB: 1940-12-10, 81 y.o.   MRN: [id number]     Advanced Heart Failure Rounding Note  PCP-Cardiologist: None   Subjective:    Remains on milrinone 0.25. Co-ox 88% (?accuracy). Diuretics on hold due to AKI, CVP 6.  Made about 1.1 L urine yesterday, creatinine stable at 3.67.  She has had persistent hyponatremia, started on 3% saline overnight but this was stopped with creatinine rising 117 => 118.   Still with occasional episodes of CP relieved by tramadol and Tylenol, no CP currently. Troponin has been generally trending down.   She is in NSR on amiodarone 60.   Feels poorly in general.    Objective:   Weight Range: 121.1 kg Body mass index is 43.09 kg/m.   Vital Signs:   Temp:  [97.5 F (36.4 C)-98.3 F (36.8 C)] 97.6 F (36.4 C) (05/29 0400) Pulse Rate:  [64-78] 71 (05/29 0600) Resp:  [14-22] 18 (05/29 0600) BP: (80-159)/(36-109) 147/36 (05/29 0600) SpO2:  [91 %-98 %] 96 % (05/29 0600) Weight:  [121.1 kg] 121.1 kg (05/29 0400) Last BM Date : 01/13/22  Weight change: Filed Weights   01/11/22 0700 01/12/22 0655 01/14/22 0400  Weight: 107 kg 112.4 kg 121.1 kg    Intake/Output:   Intake/Output Summary (Last 24 hours) at 01/14/2022 0749 Last data filed at 01/14/2022 0600 Gross per 24 hour  Intake 2243.66 ml  Output 1110 ml  Net 1133.66 ml      Physical Exam    General: NAD Neck: No JVD, no thyromegaly or thyroid nodule.  Lungs: Clear to auscultation bilaterally with normal respiratory effort. CV: Nondisplaced PMI.  Heart regular S1/S2, no S3/S4, no murmur.  No peripheral edema.   Abdomen: Soft, nontender, no hepatosplenomegaly, no distention.  Skin: Intact without lesions or rashes.  Neurologic: Alert and oriented x 3.  Psych: Normal affect. Extremities: No clubbing or cyanosis.  HEENT: Normal.    Telemetry   NSR 70s Personally reviewed  Labs    CBC Recent Labs    01/13/22 0410 01/14/22 0344  WBC 12.7* 12.7*   HGB 10.1* 9.4*  HCT 29.2* 27.5*  MCV 88.5 89.0  PLT 237 409   Basic Metabolic Panel Recent Labs    01/13/22 2343 01/14/22 0344  NA 118* 118*  K 4.4 4.2  CL 86* 86*  CO2 18* 17*  GLUCOSE 247* 254*  BUN 50* 50*  CREATININE 3.68* 3.67*  CALCIUM 7.7* 7.5*   Liver Function Tests Recent Labs    01/12/22 1335 01/14/22 0344  AST 21 19  ALT 14 14  ALKPHOS 63 61  BILITOT 0.4 0.5  PROT 5.3* 4.8*  ALBUMIN 2.7* 2.3*   No results for input(s): LIPASE, AMYLASE in the last 72 hours. Cardiac Enzymes No results for input(s): CKTOTAL, CKMB, CKMBINDEX, TROPONINI in the last 72 hours.  BNP: BNP (last 3 results) Recent Labs    01/08/22 2356  BNP 227.4*    ProBNP (last 3 results) No results for input(s): PROBNP in the last 8760 hours.   D-Dimer No results for input(s): DDIMER in the last 72 hours. Hemoglobin A1C No results for input(s): HGBA1C in the last 72 hours.  Fasting Lipid Panel No results for input(s): CHOL, HDL, LDLCALC, TRIG, CHOLHDL, LDLDIRECT in the last 72 hours.  Thyroid Function Tests No results for input(s): TSH, T4TOTAL, T3FREE, THYROIDAB in the last 72 hours.  Invalid input(s): FREET3   Other results:   Imaging    US RENAL  Result Date: 01/13/2022 CLINICAL DATA:  Acute kidney injury. EXAM: RENAL / URINARY TRACT ULTRASOUND COMPLETE COMPARISON:  None Available. FINDINGS: Right Kidney: Renal measurements: 11.6 x 5.0 x 4.3 = volume: 130 mL. Echogenic renal cortex. Anechoic avascular cyst in the upper pole measuring 2.5 x 1.3 x 2.0 and in the lateral aspect of the interpolar region measuring 2.0 x 1.9 x 2.3 cm. Left Kidney: Renal measurements: 11.4 x 6.7 x 5.5 = volume: 220 mL. Echogenic renal cortex. Cystic structure in the interpolar region measuring 2.4 x 2.6 x 2.4 cm. Bladder: Not visualized and collapsed. Other: None. IMPRESSION: 1. Echogenic renal cortices bilaterally concerning for medical renal disease. 2.  Bilateral simple renal cysts, which do  not require follow-up. 3.  No evidence of nephrolithiasis or hydronephrosis. Electronically Signed   By: Keane Police D.O.   On: 01/13/2022 15:43     Medications:     Scheduled Medications:  aspirin  81 mg Oral Daily   atorvastatin  80 mg Oral Daily   Chlorhexidine Gluconate Cloth  6 each Topical Daily   docusate sodium  100 mg Oral BID   [START ON 01/15/2022] epinephrine  0-10 mcg/min Intravenous To OR   famotidine  20 mg Oral Daily   feeding supplement  237 mL Oral TID WC   fluticasone furoate-vilanterol  1 puff Inhalation Daily   guaiFENesin  600 mg Oral BID   [START ON 01/15/2022] heparin-papaverine-plasmalyte irrigation   Irrigation To OR   insulin aspart  0-15 Units Subcutaneous TID WC   insulin aspart  0-5 Units Subcutaneous QHS   [START ON 01/15/2022] insulin   Intravenous To OR   [START ON 01/15/2022] [facility] Blood Cardioplegia vial (lidocaine/magnesium/mannitol 0.26g-4g-6.4g)   Intracoronary To OR   montelukast  10 mg Oral QHS   multivitamin with minerals  1 tablet Oral Daily   nicotine  14 mg Transdermal Daily   nitroGLYCERIN  0.1 mg Transdermal Daily   [START ON 01/15/2022] phenylephrine  30-200 mcg/min Intravenous To OR   polyethylene glycol  17 g Oral Daily   [START ON 01/15/2022] potassium chloride  80 mEq Other To OR   senna  1 tablet Oral Daily   sodium chloride flush  10-40 mL Intracatheter Q12H   sodium chloride flush  3 mL Intravenous Q12H   sodium phosphate  1 enema Rectal Once   thiamine  100 mg Oral Daily   [START ON 01/15/2022] tranexamic acid  15 mg/kg Intravenous To OR   [START ON 01/15/2022] tranexamic acid  2 mg/kg Intracatheter To OR    Infusions:  sodium chloride Stopped (01/09/22 0046)   sodium chloride Stopped (01/13/22 1442)   sodium chloride     amiodarone 30 mg/hr (01/14/22 0739)   [START ON 01/15/2022]  ceFAZolin (ANCEF) IV     [START ON 01/15/2022]  ceFAZolin (ANCEF) IV     ceFEPime (MAXIPIME) IV Stopped (01/13/22 1349)   [START ON 01/15/2022]  dexmedetomidine     [START ON 01/15/2022] heparin 30,000 units/NS 1000 mL solution for CELLSAVER     heparin 1,350 Units/hr (01/14/22 0600)   milrinone 0.25 mcg/kg/min (01/14/22 0600)   [START ON 01/15/2022] milrinone     [START ON 01/15/2022] norepinephrine     sodium chloride (hypertonic)     [START ON 01/15/2022] tranexamic acid (CYKLOKAPRON) infusion ([facility])     [START ON 01/15/2022] vancomycin      PRN Medications: sodium chloride, Place/Maintain arterial line **AND** sodium chloride, acetaminophen, albuterol, diazepam, ondansetron (ZOFRAN) IV, pneumococcal 20-valent conjugate vaccine,  sodium chloride, sodium chloride flush, sodium chloride flush, traMADol, zolpidem   Assessment/Plan   1. CAD: Patient presented with ACS. Peak HS-TnI 3557. LHC with 3VD, 50-60% dLM, ?90% ostial LAD, 75% stenosis at bifurcation of mLAD and D, 75% stenosis at bifurcation of mLCx and OM.  The ostial LAD is not completely laid out so I am not sure on review that it is truly critical.  CABG would be a consideration if LAD disease is severe, may require FFR to determine this.  She, however, may be too high risk with severe ischemic cardiomyopathy, COPD, and renal dysfunction. PCI distal left main into LAD also an option if FFR shows severe disease.  For now, medical management given current clinical issues.  Still has occasional CP but ECG ok and hstrop has been trending down.  - ASA 81 - NTG patch - Continue heparin gtt - atorvastatin 80 daily - When she has stabilized medically, would favor repeat cath with FFR LM into LAD to assess significance of distal LM/ostial LAD disease prior to CABG vs PCI. This may be a while in the future.  2. Acute systolic CHF: Patient has CAD, but not sure it explains the extent of her cardiomyopathy.  ?Stress (Takotsubo-type) CMP on top of pre-existing CAD.  Low output on initial RHC with CI 1.8.  Filling pressures significantly elevated on RHC as well.  Echo with EF 20-25%, mildly  decreased RV systolic function, trivial MR, dilated IVC. Co-ox 88% on milrinone 0.25.  Creatinine up to 2.8 -> 3.4 -> 3.67 (baseline ~1.7). CVP 6 today.  - Continue milrinone 0.25 mcg/kg/min for now, repeat co-ox to get accurate reading.  - Hold diuretics today. - See below regarding CVVH.   - Repeat echo, ?Takotsubo.  3.  Atrial fibrillation: Paroxysmal, noted initially this hospitalization.  Now in NSR. - Continue heparin gtt.  - Continue amiodarone gtt, decrease to 30.  4.  AKI: Creatinine up to 1.65 => 1.9 => 2.4 => 2.8 => 3.4 => 3.67. Suspect cardiorenal/ATN with low output + CIN.  CVP 6. Na remains low at 118 despite 3% saline.  - Discussed with renal => with persistent hyponatremia not responding well to 3% saline and volume not significantly elevated to suggest benefit from tolvaptan, we have elected to start CVVH.  She would not want long-term HD.   - Keep MAPs >= 65 SBP >= 846 (low diastolics) 5. ID: CXR with R>L lower lobe infiltrates.  WBCs 20 => 12 => 11.5, no fever.  Afebrile.  Sputum culture with GPCs -> normal flora.  - Covering for HAP with cefepime/vancomycin.  - Continue pulmonary toilet 6. COPD: Patient was a smoker up until admission.  Bedside PFTs 5/23 FEV 1 1.09L (53%) FVC 1.68 L (61%) FEF 25-75 0.54 (39%) - aggressive pulmonary toilet 7. Hyponatremia:  Na 126 -> 122 -> 120 -> 117 -> 118.  She has not responded to tolvaptan initially or 3% saline overnight.  She is not confused.  - Limit fluid intake.  - Discussed with renal, plan CVVH today as above to gradually correct Na.  8. Deconditioning - ambulate  CRITICAL CARE Performed by: Loralie Champagne  Total critical care time: 45 minutes  Critical care time was exclusive of separately billable procedures and treating other patients.  Critical care was necessary to treat or prevent imminent or life-threatening deterioration.  Critical care was time spent personally by me on the following activities: development of  treatment plan with patient and/or surrogate as well as nursing,  discussions with consultants, evaluation of patient's response to treatment, examination of patient, obtaining history from patient or surrogate, ordering and performing treatments and interventions, ordering and review of laboratory studies, ordering and review of radiographic studies, pulse oximetry and re-evaluation of patient's condition.   Length of Stay: [place], MD  01/14/2022, 7:49 AM  Advanced Heart Failure Team Pager [phone] (M-F; 7a - 5p)  Please contact [facility] for night-coverage after hours (5p -7a ) and weekends on [email or web address]

## 2022-01-14 NOTE — Progress Notes (Signed)
6 Days Post-Op Procedure(s) (LRB): Coronary/Graft Acute MI Revascularization (N/A) RIGHT HEART CATH (N/A) Subjective: Central line HD cath placed for CVVH to treat AKI Having intermittent chest pain 2D echo this am with much improved LV fx and MR  Objective: Vital signs in last 24 hours: Temp:  [97.5 F (36.4 C)-98.3 F (36.8 C)] 97.7 F (36.5 C) (05/29 0800) Pulse Rate:  [64-78] 67 (05/29 0800) Cardiac Rhythm: Normal sinus rhythm (05/29 0800) Resp:  [14-22] 18 (05/29 0800) BP: (113-159)/(33-68) 131/38 (05/29 0800) SpO2:  [92 %-98 %] 96 % (05/29 0800) Weight:  [121.1 kg] 121.1 kg (05/29 0400)  Hemodynamic parameters for last 24 hours: CVP:  [5 mmHg-17 mmHg] 6 mmHg  Intake/Output from previous day: 05/28 0701 - 05/29 0700 In: 2243.7 [P.O.:250; I.V.:1593.7; IV Piggyback:400] Out: 1110 [Urine:1110] Intake/Output this shift: Total I/O In: 105.4 [I.V.:105.4] Out: 75 [Urine:75]  EXAM Patient frustrated, anxious Scattered rhonchi No murmur 2+ edema  Lab Results: Recent Labs    01/13/22 0410 01/14/22 0344  WBC 12.7* 12.7*  HGB 10.1* 9.4*  HCT 29.2* 27.5*  PLT 237 217   BMET:  Recent Labs    01/13/22 2343 01/14/22 0344  NA 118* 118*  K 4.4 4.2  CL 86* 86*  CO2 18* 17*  GLUCOSE 247* 254*  BUN 50* 50*  CREATININE 3.68* 3.67*  CALCIUM 7.7* 7.5*    PT/INR: No results for input(s): LABPROT, INR in the last 72 hours. ABG    Component Value Date/Time   PHART 7.404 01/10/2022 0306   HCO3 24.4 01/10/2022 0306   TCO2 26 01/10/2022 0306   ACIDBASEDEF 5.0 (H) 01/09/2022 0201   O2SAT 81.7 01/14/2022 0754   CBG (last 3)  Recent Labs    01/13/22 2139 01/14/22 0638 01/14/22 0756  GLUCAP 96 98 174*    Assessment/Plan: S/P Procedure(s) (LRB): Coronary/Graft Acute MI Revascularization (N/A) RIGHT HEART CATH (N/A) Patient discussed with Einar Crow MD for coordination of care.  With AKI req HD patient does not meet criteria for CABG . Cont with med therapy ,  poss later recath with FFR of L coronary system   LOS: 5 days    Dahlia Byes 01/14/2022

## 2022-01-15 DIAGNOSIS — N179 Acute kidney failure, unspecified: Secondary | ICD-10-CM | POA: Diagnosis not present

## 2022-01-15 LAB — CBC
HCT: 29.2 % — ABNORMAL LOW (ref 36.0–46.0)
Hemoglobin: 9.9 g/dL — ABNORMAL LOW (ref 12.0–15.0)
MCH: 30.1 pg (ref 26.0–34.0)
MCHC: 33.9 g/dL (ref 30.0–36.0)
MCV: 88.8 fL (ref 80.0–100.0)
Platelets: 226 10*3/uL (ref 150–400)
RBC: 3.29 MIL/uL — ABNORMAL LOW (ref 3.87–5.11)
RDW: 13.6 % (ref 11.5–15.5)
WBC: 12 10*3/uL — ABNORMAL HIGH (ref 4.0–10.5)
nRBC: 0 % (ref 0.0–0.2)

## 2022-01-15 LAB — COOXEMETRY PANEL
Carboxyhemoglobin: 1.6 % — ABNORMAL HIGH (ref 0.5–1.5)
Methemoglobin: 0.8 % (ref 0.0–1.5)
O2 Saturation: 81.3 %
Total hemoglobin: <6.9 g/dL — CL (ref 12.0–16.0)

## 2022-01-15 LAB — RENAL FUNCTION PANEL
Albumin: 2.4 g/dL — ABNORMAL LOW (ref 3.5–5.0)
Anion gap: 8 (ref 5–15)
BUN: 26 mg/dL — ABNORMAL HIGH (ref 8–23)
CO2: 25 mmol/L (ref 22–32)
Calcium: 7.9 mg/dL — ABNORMAL LOW (ref 8.9–10.3)
Chloride: 96 mmol/L — ABNORMAL LOW (ref 98–111)
Creatinine, Ser: 1.87 mg/dL — ABNORMAL HIGH (ref 0.44–1.00)
GFR, Estimated: 26 mL/min — ABNORMAL LOW (ref >60)
Glucose, Bld: 165 mg/dL — ABNORMAL HIGH (ref 70–99)
Phosphorus: 3.3 mg/dL (ref 2.5–4.6)
Potassium: 3.6 mmol/L (ref 3.5–5.1)
Sodium: 129 mmol/L — ABNORMAL LOW (ref 135–145)

## 2022-01-15 LAB — GLUCOSE, CAPILLARY
Glucose-Capillary: 95 mg/dL (ref 70–99)
Glucose-Capillary: 104 mg/dL — ABNORMAL HIGH (ref 70–99)
Glucose-Capillary: 110 mg/dL — ABNORMAL HIGH (ref 70–99)
Glucose-Capillary: 97 mg/dL (ref 70–99)

## 2022-01-15 LAB — COMPREHENSIVE METABOLIC PANEL
ALT: 16 U/L (ref 0–44)
AST: 22 U/L (ref 15–41)
Albumin: 2.4 g/dL — ABNORMAL LOW (ref 3.5–5.0)
Alkaline Phosphatase: 65 U/L (ref 38–126)
Anion gap: 9 (ref 5–15)
BUN: 32 mg/dL — ABNORMAL HIGH (ref 8–23)
CO2: 22 mmol/L (ref 22–32)
Calcium: 7.8 mg/dL — ABNORMAL LOW (ref 8.9–10.3)
Chloride: 92 mmol/L — ABNORMAL LOW (ref 98–111)
Creatinine, Ser: 2.18 mg/dL — ABNORMAL HIGH (ref 0.44–1.00)
GFR, Estimated: 22 mL/min — ABNORMAL LOW (ref >60)
Glucose, Bld: 169 mg/dL — ABNORMAL HIGH (ref 70–99)
Potassium: 3.5 mmol/L (ref 3.5–5.1)
Sodium: 123 mmol/L — ABNORMAL LOW (ref 135–145)
Total Bilirubin: 0.5 mg/dL (ref 0.3–1.2)
Total Protein: 5.3 g/dL — ABNORMAL LOW (ref 6.5–8.1)

## 2022-01-15 LAB — MAGNESIUM: Magnesium: 2.5 mg/dL — ABNORMAL HIGH (ref 1.7–2.4)

## 2022-01-15 LAB — UREA NITROGEN, URINE: Urea Nitrogen, Ur: 199 mg/dL

## 2022-01-15 LAB — EXPECTORATED SPUTUM ASSESSMENT W GRAM STAIN, RFLX TO RESP C

## 2022-01-15 LAB — PHOSPHORUS: Phosphorus: 3.4 mg/dL (ref 2.5–4.6)

## 2022-01-15 LAB — HEPARIN LEVEL (UNFRACTIONATED): Heparin Unfractionated: 0.54 IU/mL (ref 0.30–0.70)

## 2022-01-15 MED ORDER — MIDODRINE HCL 5 MG PO TABS
5.0000 mg | ORAL_TABLET | Freq: Three times a day (TID) | ORAL | Status: DC
  Administered 2022-01-15 – 2022-01-21 (×19): 5 mg via ORAL
  Filled 2022-01-15 (×18): qty 1

## 2022-01-15 NOTE — Progress Notes (Addendum)
Patient ID: Brittany Jackson, female   DOB: 12/31/1940, 81 y.o.   MRN: [id number]     Advanced Heart Failure Rounding Note  PCP-Cardiologist: None   Subjective:    Started CRRT 05/29 pulling for negative 50/hr. I/Os net negative 587.  Continues on milrinone 0.25. CO-OX 81%.  CVP 9 but waveform fairly flat.   Na starting to trend up, 117>118>121>123.  Maintaining SR on amiodarone gtt at 30/hr.  No CP or dyspnea. Currently uncomfortable. Wondering when she can come off CVVHD.   Repeat echo 05/29: EF improved to 65-70%, RV okay   Objective:   Weight Range: 112.4 kg Body mass index is 40 kg/m.   Vital Signs:   Temp:  [97.3 F (36.3 C)-97.7 F (36.5 C)] 97.6 F (36.4 C) (05/30 0400) Pulse Rate:  [66-80] 80 (05/30 0600) Resp:  [12-20] 19 (05/30 0600) BP: (102-148)/(33-77) 145/52 (05/30 0600) SpO2:  [91 %-100 %] 96 % (05/30 0600) Weight:  [112.4 kg] 112.4 kg (05/30 0500) Last BM Date : 01/13/22  Weight change: Filed Weights   01/12/22 0655 01/14/22 0400 01/15/22 0500  Weight: 112.4 kg 121.1 kg 112.4 kg    Intake/Output:   Intake/Output Summary (Last 24 hours) at 01/15/2022 0658 Last data filed at 01/15/2022 0600 Gross per 24 hour  Intake 1620.26 ml  Output 2208 ml  Net -587.74 ml      Physical Exam  CVP 9-10 General:  No distress. Reclined in chair. HEENT: normal Neck: supple. JVP ~ 10 cm. Carotids 2+ bilat; no bruits. RIJ HD cath. Cor: PMI nondisplaced. Regular rate & rhythm. No rubs, gallops or murmurs. Lungs: clear Abdomen: soft, nontender, nondistended.  Extremities: no cyanosis, clubbing, rash, edema Neuro: alert & orientedx3, cranial nerves grossly intact. moves all 4 extremities w/o difficulty. Affect pleasant     Telemetry   NSR 70s (personally reviewed)  Labs    CBC Recent Labs    01/14/22 0344 01/15/22 0430  WBC 12.7* 12.0*  HGB 9.4* 9.9*  HCT 27.5* 29.2*  MCV 89.0 88.8  PLT 217 629   Basic Metabolic Panel Recent Labs     01/14/22 1459 01/15/22 0430  NA 121* 123*  K 4.4 3.5  CL 90* 92*  CO2 20* 22  GLUCOSE 180* 169*  BUN 49* 32*  CREATININE 3.38* 2.18*  CALCIUM 7.8* 7.8*  MG  --  2.5*  PHOS 5.4* 3.4   Liver Function Tests Recent Labs    01/14/22 0752 01/14/22 1459 01/15/22 0430  AST 22  --  22  ALT 14  --  16  ALKPHOS 66  --  65  BILITOT 0.6  --  0.5  PROT 5.5*  --  5.3*  ALBUMIN 2.5* 2.5* 2.4*   No results for input(s): LIPASE, AMYLASE in the last 72 hours. Cardiac Enzymes No results for input(s): CKTOTAL, CKMB, CKMBINDEX, TROPONINI in the last 72 hours.  BNP: BNP (last 3 results) Recent Labs    01/08/22 2356  BNP 227.4*    ProBNP (last 3 results) No results for input(s): PROBNP in the last 8760 hours.   D-Dimer No results for input(s): DDIMER in the last 72 hours. Hemoglobin A1C No results for input(s): HGBA1C in the last 72 hours.  Fasting Lipid Panel No results for input(s): CHOL, HDL, LDLCALC, TRIG, CHOLHDL, LDLDIRECT in the last 72 hours.  Thyroid Function Tests No results for input(s): TSH, T4TOTAL, T3FREE, THYROIDAB in the last 72 hours.  Invalid input(s): FREET3   Other results:   Imaging  DG Chest Port 1 View  Result Date: 01/14/2022 CLINICAL DATA:  Central line placement. EXAM: PORTABLE CHEST 1 VIEW COMPARISON:  01/12/2022 FINDINGS: Interval right jugular catheter with its tip in the superior vena cava. No pneumothorax. Stable enlarged cardiac silhouette and tortuous and calcified thoracic aorta. Stable prominence of the pulmonary vasculature and interstitial markings. Mild increase in patchy density at both lung bases. Proximal left humerus fixation hardware and old fracture. IMPRESSION: 1. Right jugular catheter tip in the superior vena cava without pneumothorax. 2. Increased bibasilar patchy atelectasis or pneumonia, left greater than right. 3. Stable cardiomegaly, pulmonary vascular congestion and chronic interstitial lung disease. Electronically Signed    By: Claudie Revering M.D.   On: 01/14/2022 09:22   ECHOCARDIOGRAM LIMITED  Result Date: 01/14/2022    ECHOCARDIOGRAM LIMITED REPORT   Patient Name:   Brittany Jackson Date of Exam: 01/14/2022 Medical Rec #:  [id number]     Height:       66.0 in Accession #:    [id number]    Weight:       267.0 lb Date of Birth:  [birth year]     BSA:          2.261 m Patient Age:    31 years      BP:           131/38 mmHg Patient Gender: F             HR:           70 bpm. Exam Location:  Inpatient Procedure: Limited Echo, Color Doppler and Cardiac Doppler Indications:    U20.25 Acute systolic (congestive) heart failure  History:        Patient has prior history of Echocardiogram examinations, most                 recent 01/09/2022. CAD; Risk Factors:Hypertension.  Sonographer:    Raquel Sarna Senior RDCS Referring Phys: [place] Comments: Limited to reevaluate LV function IMPRESSIONS  1. Left ventricular ejection fraction, by estimation, is 65 to 70%. The left ventricle has normal function. The left ventricle has no regional wall motion abnormalities.  2. Right ventricular systolic function is normal. The right ventricular size is normal.  3. The mitral valve is normal in structure. Trivial mitral valve regurgitation. No evidence of mitral stenosis. Moderate mitral annular calcification.  4. The aortic valve is tricuspid.  5. The inferior vena cava is normal in size with greater than 50% respiratory variability, suggesting right atrial pressure of 3 mmHg.  6. Limited study to assess LV function; LV function normal and improved compared to previous. FINDINGS  Left Ventricle: Left ventricular ejection fraction, by estimation, is 65 to 70%. The left ventricle has normal function. The left ventricle has no regional wall motion abnormalities. The left ventricular internal cavity size was normal in size. There is  no left ventricular hypertrophy. Right Ventricle: The right ventricular size is normal. Right ventricular  systolic function is normal. Left Atrium: Left atrial size was normal in size. Right Atrium: Right atrial size was normal in size. Pericardium: There is no evidence of pericardial effusion. Mitral Valve: The mitral valve is normal in structure. Moderate mitral annular calcification. Trivial mitral valve regurgitation. MV peak gradient, 11.7 mmHg. The mean mitral valve gradient is 5.0 mmHg. Tricuspid Valve: The tricuspid valve is normal in structure. Tricuspid valve regurgitation is trivial. Aortic Valve: The aortic valve is tricuspid. Aortic valve regurgitation is not visualized. Aortic valve sclerosis/calcification  is present, without any evidence of aortic stenosis. Pulmonic Valve: The pulmonic valve was not well visualized. Aorta: The aortic root is normal in size and structure. Venous: The inferior vena cava is normal in size with greater than 50% respiratory variability, suggesting right atrial pressure of 3 mmHg. IAS/Shunts: The interatrial septum was not assessed. Additional Comments: Limited study to assess LV function; LV function normal and improved compared to previous. RIGHT VENTRICLE RV S prime:     16.40 cm/s TAPSE (M-mode): 2.1 cm AORTIC VALVE LVOT Vmax:   138.00 cm/s LVOT Vmean:  92.300 cm/s LVOT VTI:    0.256 m MITRAL VALVE MV Peak grad: 11.7 mmHg  SHUNTS MV Mean grad: 5.0 mmHg   Systemic VTI: 0.26 m MV Vmax:      1.71 m/s MV Vmean:     111.0 cm/s Kirk Ruths MD Electronically signed by Kirk Ruths MD Signature Date/Time: 01/14/2022/10:10:24 AM    Final      Medications:     Scheduled Medications:  aspirin  81 mg Oral Daily   atorvastatin  80 mg Oral Daily   Chlorhexidine Gluconate Cloth  6 each Topical Daily   docusate sodium  100 mg Oral BID   epinephrine  0-10 mcg/min Intravenous To OR   famotidine  10 mg Oral Daily   feeding supplement  237 mL Oral TID WC   fluticasone furoate-vilanterol  1 puff Inhalation Daily   guaiFENesin  600 mg Oral BID   heparin-papaverine-plasmalyte  irrigation   Irrigation To OR   insulin aspart  0-15 Units Subcutaneous TID WC   insulin aspart  0-5 Units Subcutaneous QHS   insulin   Intravenous To OR   [place] Blood Cardioplegia vial (lidocaine/magnesium/mannitol 0.26g-4g-6.4g)   Intracoronary To OR   montelukast  10 mg Oral QHS   multivitamin with minerals  1 tablet Oral Daily   nicotine  14 mg Transdermal Daily   nitroGLYCERIN  0.1 mg Transdermal Daily   phenylephrine  30-200 mcg/min Intravenous To OR   polyethylene glycol  17 g Oral Daily   potassium chloride  80 mEq Other To OR   senna  1 tablet Oral Daily   sodium chloride flush  10-40 mL Intracatheter Q12H   sodium chloride flush  3 mL Intravenous Q12H   sodium phosphate  1 enema Rectal Once   thiamine  100 mg Oral Daily   tranexamic acid  15 mg/kg Intravenous To OR   tranexamic acid  2 mg/kg Intracatheter To OR    Infusions:  sodium chloride Stopped (01/09/22 0046)   sodium chloride     amiodarone 30 mg/hr (01/15/22 0600)    ceFAZolin (ANCEF) IV      ceFAZolin (ANCEF) IV     ceFEPime (MAXIPIME) IV Stopped (01/14/22 2219)   dexmedetomidine     heparin 30,000 units/NS 1000 mL solution for CELLSAVER     heparin 1,350 Units/hr (01/15/22 0600)   milrinone 0.25 mcg/kg/min (01/15/22 0600)   milrinone     norepinephrine     prismasol BGK 2/2.5 dialysis solution 1,000 mL/hr at 01/15/22 0430   prismasol BGK 2/2.5 replacement solution 400 mL/hr at 01/14/22 1313   prismasol BGK 2/2.5 replacement solution 500 mL/hr at 01/14/22 2324   tranexamic acid (CYKLOKAPRON) infusion ([facility])     vancomycin      PRN Medications: Place/Maintain arterial line **AND** sodium chloride, acetaminophen, albuterol, diazepam, heparin, ondansetron (ZOFRAN) IV, oxyCODONE, pneumococcal 20-valent conjugate vaccine, sodium chloride, traMADol, zolpidem   Assessment/Plan   1. CAD:  Patient presented with ACS. Peak HS-TnI 3557. LHC with 3VD, 50-60% dLM, ?90% ostial LAD, 75% stenosis at bifurcation of  mLAD and D, 75% stenosis at bifurcation of mLCx and OM.  The ostial LAD is not completely laid out so not sure on review that it is truly critical.  CABG would be a consideration if LAD disease is severe, may require FFR to determine this.  She, however, may be too high risk with severe ischemic cardiomyopathy, COPD, and renal dysfunction. PCI distal left main into LAD also an option if FFR shows severe disease.  For now, medical management given current clinical issues.  Still has occasional CP but ECG ok and hstrop has been trending down.  - ASA 81 - NTG patch - Continue heparin gtt - atorvastatin 80 daily - When she has stabilized medically, would favor repeat cath with FFR LM into LAD to assess significance of distal LM/ostial LAD disease prior to CABG vs PCI. This may be a while in the future.  2. Acute systolic CHF: Patient has CAD, but not sure it explains the extent of her cardiomyopathy.  ?Stress (Takotsubo-type) CMP on top of pre-existing CAD.  Low output on initial RHC with CI 1.8.  Filling pressures significantly elevated on RHC as well.  Echo initially with EF 20-25%, mildly decreased RV systolic function, trivial MR, dilated IVC. Repeat echo 05/29 with improvement in EF to 65-70% => suspect Takotsubo CMP. - Co-ox 81% on milrinone 0.25.  Creatinine up to 2.8 -> 3.4 -> 3.67 (baseline ~1.7). CVP 9-10.  - Continue milrinone 0.25 mcg/kg/min for now. - Volume currently managed with CVVH. Still making some urine. 3.  Atrial fibrillation: Paroxysmal, noted initially this hospitalization.  Now in NSR. - Continue heparin gtt.  - Continue amiodarone gtt at 30/hr 4.  AKI: Creatinine up to 1.65 => 1.9 => 2.4 => 2.8 => 3.4 => 3.67. Suspect cardiorenal/ATN with low output + CIN.  CVP 9-10.  - With persistent hyponatremia not responding well to 3% saline and volume not significantly elevated to suggest benefit from tolvaptan, started CVVH on 05/29.  She would not want long-term HD.   - Currently  tolerating CVVH, pulling for negative 50/hr - Keep MAPs >= 65, SBP >= 665 (low diastolics) 5. ID: CXR with R>L lower lobe infiltrates.  WBCs 20 => 12 => 11.5 => 12, no fever.  Afebrile.  Sputum culture with GPCs -> normal flora.  - Covering for HAP with cefepime. - Continue pulmonary toilet 6. COPD: Patient was a smoker up until admission.  Bedside PFTs 5/23 FEV 1 1.09L (53%) FVC 1.68 L (61%) FEF 25-75 0.54 (39%) - aggressive pulmonary toilet 7. Hyponatremia:  Na 126 -> 122 -> 120 -> 117 -> 118.  She has not responded to tolvaptan initially or 3% saline overnight.  She is not confused. Now improving with CVVH, up to 123 this am. - Limit fluid intake.  8. Deconditioning - ambulate  Length of Stay: 6  FINCH, LINDSAY N, PA-C  01/15/2022, 6:58 AM  Advanced Heart Failure Team Pager [phone] (M-F; 7a - 5p)  Please contact [facility] for night-coverage after hours (5p -7a ) and weekends on [email or web address]   Patient seen with PA, agree with the above note.   CVVH started yesterday with intractable hyponatremia, Na now slowly rising (up to 123 today).  CVP 11.  Co-ox 81%.   She remains on milrinone 0.25 but echo yesterday showed EF up to 65% with normal RV.   She continues on  empiric PNA coverage with cefepime.   General: NAD Neck: JVP 10-12 cm, no thyromegaly or thyroid nodule.  Lungs: Clear to auscultation bilaterally with normal respiratory effort. CV: Nondisplaced PMI.  Heart regular S1/S2, no S3/S4, no murmur.  No peripheral edema.   Abdomen: Soft, nontender, no hepatosplenomegaly, no distention.  Skin: Intact without lesions or rashes.  Neurologic: Alert and oriented x 3.  Psych: Normal affect. Extremities: No clubbing or cyanosis.  HEENT: Normal.   Na slowly improving with CVVH, 123 today.  Would continue CVVH today, hopefully can stop tomorrow.  With CVP 11, will pull gently via ultrafiltration (aim for about 75 cc/hr net negative). Suspect AKI was due to ATN, hopefully will  begin to recover.   With recovery of LV systolic function, suspect patient had Takotsubo cardiomyopathy.  She does have underlying CAD.  - Stop milrinone today.  - Eventually, would favor evaluation of LM/LAD by FFR.  Will need recovery of renal function first.   Remain in NSR.  Can transition to po amiodarone after milrinone off.   CRITICAL CARE Performed by: Loralie Champagne  Total critical care time: 35 minutes  Critical care time was exclusive of separately billable procedures and treating other patients.  Critical care was necessary to treat or prevent imminent or life-threatening deterioration.  Critical care was time spent personally by me on the following activities: development of treatment plan with patient and/or surrogate as well as nursing, discussions with consultants, evaluation of patient's response to treatment, examination of patient, obtaining history from patient or surrogate, ordering and performing treatments and interventions, ordering and review of laboratory studies, ordering and review of radiographic studies, pulse oximetry and re-evaluation of patient's condition.  Loralie Champagne 01/15/2022 7:49 AM

## 2022-01-15 NOTE — TOC Initial Note (Signed)
Transition of Care [facility]) - Initial/Assessment Note    Patient Details  Name: Brittany Jackson MRN: [id number] Date of Birth: [birth year]  [facility] CM/SW Contact:    Erenest Rasher, RN Phone Number: [phone] 01/15/2022, 4:46 PM  Clinical Narrative:                 HF TOC CM spoke to pt and dtr, Brittany Jackson at bedside. Dtr reports she lives next to pt but pt lives in her home alone. States pt can stay with her if she is unable to return home. Explained waiting PT/OT recommendations. Pt may need oxygen for home.   Expected Discharge Plan: Skilled Nursing Facility Barriers to Discharge: Continued Medical Work up   Patient Goals and CMS Choice Patient states their goals for this hospitalization and ongoing recovery are:: wants her to get better CMS Medicare.gov Compare Post Acute Care list provided to:: Patient Represenative (must comment) (dtr-Brittany Jackson) Choice offered to / list presented to : Adult Children  Expected Discharge Plan and Services Expected Discharge Plan: [place]   Discharge Planning Services: CM Consult   Living arrangements for the past 2 months: Single Family Home                                      Prior Living Arrangements/Services Living arrangements for the past 2 months: Single Family Home Lives with:: Self Patient language and need for interpreter reviewed:: Yes Do you feel safe going back to the place where you live?: Yes      Need for Family Participation in Patient Care: Yes (Comment) Care giver support system in place?: Yes (comment) Current home services: DME (bed rail) Criminal Activity/Legal Involvement Pertinent to Current Situation/Hospitalization: No - Comment as needed  Activities of Daily Living Home Assistive Devices/Equipment: None ADL Screening (condition at time of admission) Patient's cognitive ability adequate to safely complete daily activities?: Yes Is the patient deaf or have  difficulty hearing?: Yes Does the patient have difficulty seeing, even when wearing glasses/contacts?: No Does the patient have difficulty concentrating, remembering, or making decisions?: No Patient able to express need for assistance with ADLs?: Yes Does the patient have difficulty dressing or bathing?: No Independently performs ADLs?: Yes (appropriate for developmental age) Does the patient have difficulty walking or climbing stairs?: No Weakness of Legs: None Weakness of Arms/Hands: None  Permission Sought/Granted Permission sought to share information with : Family Supports, PCP, Case Manager Permission granted to share information with : Yes, Verbal Permission Granted  Share Information with NAME: Brittany Jackson  Permission granted to share info w AGENCY: [place] granted to share info w Relationship: daughter  Permission granted to share info w Contact Information: [phone]  Emotional Assessment Appearance:: Appears stated age Attitude/Demeanor/Rapport: Gracious Affect (typically observed): Accepting Orientation: : Oriented to Self, Oriented to Place, Oriented to  Time, Oriented to Situation   Psych Involvement: No (comment)  Admission diagnosis:  ST elevation myocardial infarction (STEMI), unspecified artery (HCC) [I21.3] STEMI (ST elevation myocardial infarction) ([place]) [I21.3] Acute ST elevation myocardial infarction (STEMI) of anterolateral wall (HCC) [I21.09] Patient Active Problem List   Diagnosis Date Noted   STEMI (ST elevation myocardial infarction) ([place]) 01/09/2022   Acute ST elevation myocardial infarction (STEMI) of anterolateral wall ([place]) 01/09/2022   Acute combined systolic and diastolic heart failure ([place]) 04/09/2012   HTN (hypertension) 04/09/2012   Tobacco abuse  04/09/2012   Headache(784.0) 04/09/2012   PCP:  Redmond School, MD Pharmacy:   [place], [place] [id number] Phone: [phone] Fax: [phone]     Social Determinants of Health (SDOH) Interventions    Readmission Risk Interventions     View : No data to display.

## 2022-01-15 NOTE — Progress Notes (Signed)
ANTICOAGULATION CONSULT NOTE - Follow Up Consult  Pharmacy Consult for heparin Indication: chest pain/ACS and atrial fibrillation  Allergies  Allergen Reactions   Codeine Other (See Comments)    Unknown   Meprobamate Swelling   Sulfa Antibiotics Other (See Comments)    Unknown    Patient Measurements: Height: '5\' 6"'$  (167.6 cm) Weight: 112.4 kg (247 lb 12.8 oz) IBW/kg (Calculated) : 59.3 Heparin Dosing Weight: 85.7 kg  Vital Signs: Temp: 97.6 F (36.4 C) (05/30 0400) Temp Source: Oral (05/30 0400) BP: 145/52 (05/30 0600) Pulse Rate: 80 (05/30 0600)  Labs: Recent Labs    01/12/22 1335 01/12/22 1335 01/13/22 0410 01/13/22 1608 01/14/22 0344 01/14/22 0421 01/14/22 0752 01/14/22 1459 01/15/22 0430  HGB  --    < > 10.1*  --  9.4*  --   --   --  9.9*  HCT  --   --  29.2*  --  27.5*  --   --   --  29.2*  PLT  --   --  237  --  217  --   --   --  226  HEPARINUNFRC  --   --  0.64  --   --  0.44  --   --  0.54  CREATININE 3.03*  --  3.40*   < > 3.67*  --  3.69* 3.38* 2.18*  TROPONINIHS 322*  --   --   --   --   --   --   --   --    < > = values in this interval not displayed.     Estimated Creatinine Clearance: 24.8 mL/min (A) (by C-G formula based on SCr of 2.18 mg/dL (H)).  Assessment: 81 y.o. female admitted with Afib and STEMI s/p cath and awaiting CABG versus high-risk PCI. Patient is not on any anticoagulants PTA. Pharmacy has been consulted for heparin.  Heparin level remains therapeutic this morning at 0.54, CBC stable.  Goal of Therapy:  Heparin level 0.3-0.7 units/ml Monitor platelets by anticoagulation protocol: Yes   Plan:  Continue IV heparin at 1350 units/hr Daily heparin level and CBC  Arrie Senate, PharmD, [place], [facility] Clinical Pharmacist [phone] Please check AMION for all [place] numbers 01/15/2022

## 2022-01-15 NOTE — Progress Notes (Signed)
UOP has slowed this afternoon.   Continues on CVVH.  Currently pulling for negative 50/hr.  CVP 14-16.   Discussed with Dr. Aundra Dubin. Pull for negative 75/hr. Add midodrine 5 mg TID to support BP.

## 2022-01-15 NOTE — Progress Notes (Signed)
Nutrition Follow-up  DOCUMENTATION CODES:   Obesity unspecified  INTERVENTION:   Continue MVI with Minerals  Continue liberalized diet    Continue Ensure Enlive po TID-encourage intake, each supplement provides 350 kcal and 20 grams of protein.  If po intake does not improve, recommend Cortrak insertion  NUTRITION DIAGNOSIS:   Increased nutrient needs related to acute illness as evidenced by estimated needs.  Being addressed via liberalized diet, supplements  GOAL:   Patient will meet greater than or equal to 90% of their needs  Progressing  MONITOR:   PO intake, Supplement acceptance, Diet advancement, Labs, Weight trends, I & O's  REASON FOR ASSESSMENT:   Consult Assessment of nutrition requirement/status, Poor PO  ASSESSMENT:   Pt admitted with SOB secondary to STEMI. PMH significant for CHF, HTN, tobacco abuse and urticaria.  5/24: s/p R/LHC with coronary angiography 5/29: CRRT initiated, Repeat ECHO with EF improved to 65-70%  Noted possible d/c of CRRT tomorrow  Pt affect appears to be flat on visit today. Pt reports "I am either going to make it or I am not."  Pt is eating minimally. Pt had not touched lunch tray on visit today; minimal po intake at breakfast. Recorded po intake 0-80% of meals; 26% of meals on averaged. Noted pt did not eat anything yesterday. Pt does indicated altered taste which is affecting her po intake.   Pt reports she drank an Ensure today but according to RN, pt refused the Ensure shake  Admit wt: 112.6 kg Current wt: 112.4 Lowest wt: 107 kg Net+3.5 L per I/O flow sheet  +constipation, resolved today with +BM  Labs: sodium 123 (L-improving),  Meds: thiamine, senna, miralax, ss novolog  Diet Order:   Diet Order             Diet regular Room service appropriate? Yes; Fluid consistency: Thin; Fluid restriction: 1500 mL Fluid  Diet effective now                   EDUCATION NEEDS:   Education needs have been  addressed  Skin:  Skin Assessment: Reviewed RN Assessment  Last BM:  5/23  Height:   Ht Readings from Last 1 Encounters:  01/12/22 '5\' 6"'$  (1.676 m)    Weight:   Wt Readings from Last 1 Encounters:  01/15/22 112.4 kg    Ideal Body Weight:  59.1 kg  BMI:  Body mass index is 40 kg/m.  Estimated Nutritional Needs:   Kcal:  1800-2000  Protein:  90-105g  Fluid:  >/=1.8L   Kerman Passey MS, RDN, LDN, CNSC Registered Dietitian III Clinical Nutrition RD Pager and On-Call Pager Number Located in [place]

## 2022-01-15 NOTE — Progress Notes (Signed)
Physical Therapy Treatment Patient Details Name: Brittany Jackson MRN: [id number] DOB: 14-Jan-1941 Today's Date: 01/15/2022   History of Present Illness Pt is an 81 y.o. female admitted 01/08/22 with STEMI, PNA, afib. S/p emergent RHC with multivessel disease. Per CTS, does not currently meet criteria for CABG/MVR; may consider high risk PCI vs repeat cath. Pt with AKI; CRRT initiated 5/29. PMH includes current smoker, COPD, CHF, HTN.   PT Comments    Pt progressing with mobility. Today's session focused on seated and standing therex for improving strength and activity tolerance; pt's standing activity limited by confines of CRRT/lines. Pt with significant fatigue and c/o pain "everywhere," but still agreeable to participate. Pt remains limited by generalized weakness, decreased activity tolerance, and impaired balance strategies/postural reactions. Continue to recommend intensive CIR-level therapies to maximize functional mobility and independence prior to return home.  SpO2 92% on RA HR 80s BP 106/47    Recommendations for follow up therapy are one component of a multi-disciplinary discharge planning process, led by the attending physician.  Recommendations may be updated based on patient status, additional functional criteria and insurance authorization.  Follow Up Recommendations  Acute inpatient rehab (3hours/day)     Assistance Recommended at Discharge Intermittent Supervision/Assistance  Patient can return home with the following A little help with walking and/or transfers;A little help with bathing/dressing/bathroom;Help with stairs or ramp for entrance;Assistance with cooking/housework   Equipment Recommendations   (TBD - shower chair)    Recommendations for Other Services       Precautions / Restrictions Precautions Precautions: Fall;Other (comment) Precaution Comments: multiple lines, CRRT Restrictions Weight Bearing Restrictions: No     Mobility  Bed Mobility Overal bed  mobility: Needs Assistance Bed Mobility: Supine to Sit, Sit to Supine     Supine to sit: Mod assist, HOB elevated Sit to supine: Supervision   General bed mobility comments: ModA for HHA to elevate trunk from elevated HOB, able to scoot self to EOB without assist; return to supine with supervision for lines    Transfers Overall transfer level: Needs assistance Equipment used: 1 person hand held assist, Rolling walker (2 wheels) Transfers: Sit to/from Stand Sit to Stand: Min assist, Min guard           General transfer comment: Multiple sit<>stands from EOB with initial HHA to stabilize and assist trunk elevation; additional trial with RW, min guard for balance    Ambulation/Gait Ambulation/Gait assistance: Min assist Gait Distance (Feet): 20 Feet     Gait velocity: Decreased     General Gait Details: pt ambulating 4' forwards backwards x5 trials (distance limited by CRRT/lines), intermittent minA to stabilize especially with backwards steps; stability improved with RW; pt requesting seated rest secondary to fatigue   Stairs             Wheelchair Mobility    Modified Rankin (Stroke Patients Only)       Balance Overall balance assessment: Needs assistance Sitting-balance support: No upper extremity supported, Feet supported Sitting balance-Leahy Scale: Fair     Standing balance support: Bilateral upper extremity supported, During functional activity, Reliant on assistive device for balance, Single extremity supported Standing balance-Leahy Scale: Poor Standing balance comment: preference for HHA, therefore provided RW for added stability                            Cognition Arousal/Alertness: Awake/alert Behavior During Therapy: Flat affect Overall Cognitive Status: Within Functional Limits for tasks assessed  General Comments: WFL for simple tasks, endorses fatigue and HOH        Exercises  General Exercises - Lower Extremity Long Arc Quad: AROM, Both, Seated Other Exercises Other Exercises: bouts of marching in place, side steps at EOB, ambulating forwards/backwards - limited by confines of CRRT/lines    General Comments General comments (skin integrity, edema, etc.): BP 106/47, HR 81, SpO2 92% on RA      Pertinent Vitals/Pain Pain Assessment Pain Assessment: Faces Faces Pain Scale: Hurts little more Pain Location: "everywhere" (specifically head, back, R-side chest) Pain Descriptors / Indicators: Discomfort Pain Intervention(s): Monitored during session, Limited activity within patient's tolerance    Home Living                          Prior Function            PT Goals (current goals can now be found in the care plan section) Progress towards PT goals: Progressing toward goals    Frequency    Min 3X/week      PT Plan Current plan remains appropriate    Co-evaluation              AM-PAC PT "6 Clicks" Mobility   Outcome Measure  Help needed turning from your back to your side while in a flat bed without using bedrails?: A Little Help needed moving from lying on your back to sitting on the side of a flat bed without using bedrails?: A Little Help needed moving to and from a bed to a chair (including a wheelchair)?: A Lot Help needed standing up from a chair using your arms (e.g., wheelchair or bedside chair)?: A Little Help needed to walk in hospital room?: Total Help needed climbing 3-5 steps with a railing? : Total 6 Click Score: 13    End of Session   Activity Tolerance: Patient tolerated treatment well;Patient limited by fatigue Patient left: in bed;with call bell/phone within reach Nurse Communication: Mobility status PT Visit Diagnosis: Unsteadiness on feet (R26.81);Muscle weakness (generalized) (M62.81)     Time: 0623-7628 PT Time Calculation (min) (ACUTE ONLY): 17 min  Charges:  $Therapeutic Exercise: 8-22 mins                      Mabeline Caras, PT, DPT Acute Rehabilitation Services  Pager [phone] Office [place] 01/15/2022, 11:06 AM

## 2022-01-15 NOTE — Progress Notes (Signed)
[facility] Progress Note    Assessment/ Plan:   AKI on CKD3a (bl Cr ~1.1) -AKI likely multifactorial: Secondary to acute CHF exacerbation and CIN.  Likely in ATN at this point. -Was nonoliguric however no robust response with Lasix.  Started CRRT on 5/29 especially as her kidney function is a limiting factor for further management of her cardiac conditions. S/P RIJ temp line placement on 5/29. Will continue with CRRT for another day, I am hoping to get her off CRRT by tomorrow, will continue UF w/ a goal of net neg 50cc/hr -Understandably, patient does not want long-term dialysis and hopefully this will not be the case given that she has a relatively okay baseline kidney function.  If it turns out that she does not have any renal recovery down the road, then would recommend palliative care consultation to address goals of care  Hyponatremia, severe, now improving -Thought to be secondary to CHF exacerbation in conjunction with AKI/free water excretion deficit -No real response with loop diuretics, tolvaptan, 3%. -managing with CRRT, monitor serial labs  STEMI -multivessel disease -high risk PCI down the road  Acute sCHF -EF 20-25%-likely stress cardiomyopathy, EF now improved to 65-70%. On milrinone -UF as tolerated with CRRT  HAP -abx per primary service  COPD -per primary service   Subjective:   No acute events. Started CRRT ~1pm yesterday. Tolerating CRRT thus far but she does report feeling uncomfortable with just sitting there. She does report that her n/v has improved. Uop ~1L   Objective:   BP (!) 145/52   Pulse 80   Temp 97.6 F (36.4 C) (Oral)   Resp 19   Ht '5\' 6"'$  (1.676 m)   Wt 112.4 kg   SpO2 96%   BMI 40.00 kg/m   Intake/Output Summary (Last 24 hours) at 01/15/2022 0730 Last data filed at 01/15/2022 0700 Gross per 24 hour  Intake 1758.82 ml  Output 2346 ml  Net -587.18 ml   Weight change: -8.7 kg  Physical Exam: Gen:nad, sitting up  in chair CVS:RRR Resp:diminished air entry bibasilar, no w/r/r/c, unlabored [id number] Ext: no edema Neuro: awake, alert,  moves all ext spontaneously Dialysis access: RIJ temp HD cath: c/d/i  Imaging: US RENAL  Result Date: 01/13/2022 CLINICAL DATA:  Acute kidney injury. EXAM: RENAL / URINARY TRACT ULTRASOUND COMPLETE COMPARISON:  None Available. FINDINGS: Right Kidney: Renal measurements: 11.6 x 5.0 x 4.3 = volume: 130 mL. Echogenic renal cortex. Anechoic avascular cyst in the upper pole measuring 2.5 x 1.3 x 2.0 and in the lateral aspect of the interpolar region measuring 2.0 x 1.9 x 2.3 cm. Left Kidney: Renal measurements: 11.4 x 6.7 x 5.5 = volume: 220 mL. Echogenic renal cortex. Cystic structure in the interpolar region measuring 2.4 x 2.6 x 2.4 cm. Bladder: Not visualized and collapsed. Other: None. IMPRESSION: 1. Echogenic renal cortices bilaterally concerning for medical renal disease. 2.  Bilateral simple renal cysts, which do not require follow-up. 3.  No evidence of nephrolithiasis or hydronephrosis. Electronically Signed   By: Keane Police D.O.   On: 01/13/2022 15:43   DG Chest Port 1 View  Result Date: 01/14/2022 CLINICAL DATA:  Central line placement. EXAM: PORTABLE CHEST 1 VIEW COMPARISON:  01/12/2022 FINDINGS: Interval right jugular catheter with its tip in the superior vena cava. No pneumothorax. Stable enlarged cardiac silhouette and tortuous and calcified thoracic aorta. Stable prominence of the pulmonary vasculature and interstitial markings. Mild increase in patchy density at both lung bases. Proximal left humerus  fixation hardware and old fracture. IMPRESSION: 1. Right jugular catheter tip in the superior vena cava without pneumothorax. 2. Increased bibasilar patchy atelectasis or pneumonia, left greater than right. 3. Stable cardiomegaly, pulmonary vascular congestion and chronic interstitial lung disease. Electronically Signed   By: Claudie Revering M.D.   On: 01/14/2022 09:22    ECHOCARDIOGRAM LIMITED  Result Date: 01/14/2022    ECHOCARDIOGRAM LIMITED REPORT   Patient Name:   AHRIANA GUNKEL Date of Exam: 01/14/2022 Medical Rec #:  [id number]     Height:       66.0 in Accession #:    [id number]    Weight:       267.0 lb Date of Birth:  24-Nov-1940     BSA:          2.261 m Patient Age:    81 years      BP:           131/38 mmHg Patient Gender: F             HR:           70 bpm. Exam Location:  Inpatient Procedure: Limited Echo, Color Doppler and Cardiac Doppler Indications:    W29.56 Acute systolic (congestive) heart failure  History:        Patient has prior history of Echocardiogram examinations, most                 recent 01/09/2022. CAD; Risk Factors:Hypertension.  Sonographer:    Raquel Sarna Senior RDCS Referring Phys: [place] Comments: Limited to reevaluate LV function IMPRESSIONS  1. Left ventricular ejection fraction, by estimation, is 65 to 70%. The left ventricle has normal function. The left ventricle has no regional wall motion abnormalities.  2. Right ventricular systolic function is normal. The right ventricular size is normal.  3. The mitral valve is normal in structure. Trivial mitral valve regurgitation. No evidence of mitral stenosis. Moderate mitral annular calcification.  4. The aortic valve is tricuspid.  5. The inferior vena cava is normal in size with greater than 50% respiratory variability, suggesting right atrial pressure of 3 mmHg.  6. Limited study to assess LV function; LV function normal and improved compared to previous. FINDINGS  Left Ventricle: Left ventricular ejection fraction, by estimation, is 65 to 70%. The left ventricle has normal function. The left ventricle has no regional wall motion abnormalities. The left ventricular internal cavity size was normal in size. There is  no left ventricular hypertrophy. Right Ventricle: The right ventricular size is normal. Right ventricular systolic function is normal. Left Atrium: Left atrial  size was normal in size. Right Atrium: Right atrial size was normal in size. Pericardium: There is no evidence of pericardial effusion. Mitral Valve: The mitral valve is normal in structure. Moderate mitral annular calcification. Trivial mitral valve regurgitation. MV peak gradient, 11.7 mmHg. The mean mitral valve gradient is 5.0 mmHg. Tricuspid Valve: The tricuspid valve is normal in structure. Tricuspid valve regurgitation is trivial. Aortic Valve: The aortic valve is tricuspid. Aortic valve regurgitation is not visualized. Aortic valve sclerosis/calcification is present, without any evidence of aortic stenosis. Pulmonic Valve: The pulmonic valve was not well visualized. Aorta: The aortic root is normal in size and structure. Venous: The inferior vena cava is normal in size with greater than 50% respiratory variability, suggesting right atrial pressure of 3 mmHg. IAS/Shunts: The interatrial septum was not assessed. Additional Comments: Limited study to assess LV function; LV function normal and improved  compared to previous. RIGHT VENTRICLE RV S prime:     16.40 cm/s TAPSE (M-mode): 2.1 cm AORTIC VALVE LVOT Vmax:   138.00 cm/s LVOT Vmean:  92.300 cm/s LVOT VTI:    0.256 m MITRAL VALVE MV Peak grad: 11.7 mmHg  SHUNTS MV Mean grad: 5.0 mmHg   Systemic VTI: 0.26 m MV Vmax:      1.71 m/s MV Vmean:     111.0 cm/s Kirk Ruths MD Electronically signed by Kirk Ruths MD Signature Date/Time: 01/14/2022/10:10:24 AM    Final     Labs: BMET Recent Labs  Lab 01/13/22 0410 01/13/22 1608 01/13/22 2343 01/14/22 0344 01/14/22 0752 01/14/22 1128 01/14/22 1459 01/15/22 0430  NA 120* 117* 118* 118* 122* 119* 121* 123*  K 4.4 4.7 4.4 4.2 4.3  --  4.4 3.5  CL 89* 86* 86* 86* 89*  --  90* 92*  CO2 21* 18* 18* 17* 19*  --  20* 22  GLUCOSE 95 127* 247* 254* 139*  --  180* 169*  BUN 47* 49* 50* 50* 53*  --  49* 32*  CREATININE 3.40* 3.64* 3.68* 3.67* 3.69*  --  3.38* 2.18*  CALCIUM 8.0* 8.1* 7.7* 7.5* 8.1*  --   7.8* 7.8*  PHOS  --   --   --   --   --   --  5.4* 3.4   CBC Recent Labs  Lab 01/08/22 2356 01/09/22 0133 01/12/22 0500 01/13/22 0410 01/14/22 0344 01/15/22 0430  WBC 22.8*   < > 11.5* 12.7* 12.7* 12.0*  NEUTROABS 15.8*  --   --   --   --   --   HGB 14.9   < > 10.1* 10.1* 9.4* 9.9*  HCT 45.8   < > 29.5* 29.2* 27.5* 29.2*  MCV 94.6   < > 88.3 88.5 89.0 88.8  PLT 390   < > 231 237 217 226   < > = values in this interval not displayed.    Medications:     aspirin  81 mg Oral Daily   atorvastatin  80 mg Oral Daily   Chlorhexidine Gluconate Cloth  6 each Topical Daily   docusate sodium  100 mg Oral BID   famotidine  10 mg Oral Daily   feeding supplement  237 mL Oral TID WC   fluticasone furoate-vilanterol  1 puff Inhalation Daily   guaiFENesin  600 mg Oral BID   insulin aspart  0-15 Units Subcutaneous TID WC   insulin aspart  0-5 Units Subcutaneous QHS   montelukast  10 mg Oral QHS   multivitamin with minerals  1 tablet Oral Daily   nicotine  14 mg Transdermal Daily   nitroGLYCERIN  0.1 mg Transdermal Daily   polyethylene glycol  17 g Oral Daily   senna  1 tablet Oral Daily   sodium chloride flush  10-40 mL Intracatheter Q12H   sodium chloride flush  3 mL Intravenous Q12H   sodium phosphate  1 enema Rectal Once   thiamine  100 mg Oral Daily      Gean Quint, MD [facility] 01/15/2022, 7:30 AM

## 2022-01-16 ENCOUNTER — Inpatient Hospital Stay (HOSPITAL_COMMUNITY)
Admission: EM | Disposition: A | Discharge status: Home Health | Payer: Self-pay | Source: Home / Self Care | Attending: Cardiology

## 2022-01-16 DIAGNOSIS — I5041 Acute combined systolic (congestive) and diastolic (congestive) heart failure: Secondary | ICD-10-CM | POA: Diagnosis not present

## 2022-01-16 LAB — COMPREHENSIVE METABOLIC PANEL
ALT: 14 U/L (ref 0–44)
AST: 20 U/L (ref 15–41)
Albumin: 2.4 g/dL — ABNORMAL LOW (ref 3.5–5.0)
Alkaline Phosphatase: 70 U/L (ref 38–126)
Anion gap: 8 (ref 5–15)
BUN: 22 mg/dL (ref 8–23)
CO2: 23 mmol/L (ref 22–32)
Calcium: 7.6 mg/dL — ABNORMAL LOW (ref 8.9–10.3)
Chloride: 100 mmol/L (ref 98–111)
Creatinine, Ser: 1.44 mg/dL — ABNORMAL HIGH (ref 0.44–1.00)
GFR, Estimated: 36 mL/min — ABNORMAL LOW (ref >60)
Glucose, Bld: 115 mg/dL — ABNORMAL HIGH (ref 70–99)
Potassium: 3.6 mmol/L (ref 3.5–5.1)
Sodium: 131 mmol/L — ABNORMAL LOW (ref 135–145)
Total Bilirubin: 0.3 mg/dL (ref 0.3–1.2)
Total Protein: 5 g/dL — ABNORMAL LOW (ref 6.5–8.1)

## 2022-01-16 LAB — RENAL FUNCTION PANEL
Albumin: 2.4 g/dL — ABNORMAL LOW (ref 3.5–5.0)
Anion gap: 6 (ref 5–15)
BUN: 24 mg/dL — ABNORMAL HIGH (ref 8–23)
CO2: 27 mmol/L (ref 22–32)
Calcium: 8.1 mg/dL — ABNORMAL LOW (ref 8.9–10.3)
Chloride: 100 mmol/L (ref 98–111)
Creatinine, Ser: 1.92 mg/dL — ABNORMAL HIGH (ref 0.44–1.00)
GFR, Estimated: 26 mL/min — ABNORMAL LOW (ref >60)
Glucose, Bld: 117 mg/dL — ABNORMAL HIGH (ref 70–99)
Phosphorus: 2.8 mg/dL (ref 2.5–4.6)
Potassium: 3.9 mmol/L (ref 3.5–5.1)
Sodium: 133 mmol/L — ABNORMAL LOW (ref 135–145)

## 2022-01-16 LAB — COOXEMETRY PANEL
Carboxyhemoglobin: 0.9 % (ref 0.5–1.5)
Methemoglobin: 1.8 % — ABNORMAL HIGH (ref 0.0–1.5)
O2 Saturation: 78.4 %
Total hemoglobin: 10 g/dL — ABNORMAL LOW (ref 12.0–16.0)

## 2022-01-16 LAB — HEPARIN LEVEL (UNFRACTIONATED): Heparin Unfractionated: 0.48 IU/mL (ref 0.30–0.70)

## 2022-01-16 LAB — GLUCOSE, CAPILLARY
Glucose-Capillary: 111 mg/dL — ABNORMAL HIGH (ref 70–99)
Glucose-Capillary: 103 mg/dL — ABNORMAL HIGH (ref 70–99)
Glucose-Capillary: 117 mg/dL — ABNORMAL HIGH (ref 70–99)
Glucose-Capillary: 102 mg/dL — ABNORMAL HIGH (ref 70–99)

## 2022-01-16 LAB — CBC
HCT: 27.9 % — ABNORMAL LOW (ref 36.0–46.0)
Hemoglobin: 9.5 g/dL — ABNORMAL LOW (ref 12.0–15.0)
MCH: 30.8 pg (ref 26.0–34.0)
MCHC: 34.1 g/dL (ref 30.0–36.0)
MCV: 90.6 fL (ref 80.0–100.0)
Platelets: 216 10*3/uL (ref 150–400)
RBC: 3.08 MIL/uL — ABNORMAL LOW (ref 3.87–5.11)
RDW: 14.3 % (ref 11.5–15.5)
WBC: 11.5 10*3/uL — ABNORMAL HIGH (ref 4.0–10.5)
nRBC: 0 % (ref 0.0–0.2)

## 2022-01-16 LAB — MAGNESIUM: Magnesium: 2.5 mg/dL — ABNORMAL HIGH (ref 1.7–2.4)

## 2022-01-16 SURGERY — CORONARY ARTERY BYPASS GRAFTING (CABG)
Anesthesia: General | Site: Chest

## 2022-01-16 MED ORDER — SENNA 8.6 MG PO TABS: 2.0000 | ORAL_TABLET | Freq: Once | ORAL | Status: DC

## 2022-01-16 MED ORDER — SENNA 8.6 MG PO TABS
2.0000 | ORAL_TABLET | Freq: Every day | ORAL | Status: AC
  Administered 2022-01-16: 17.2 mg via ORAL
  Filled 2022-01-16: qty 2

## 2022-01-16 MED ORDER — SODIUM CHLORIDE 0.9 % IV SOLN
2.0000 g | Freq: Two times a day (BID) | INTRAVENOUS | Status: AC
  Administered 2022-01-16 (×2): 2 g via INTRAVENOUS
  Filled 2022-01-16 (×2): qty 12.5

## 2022-01-16 MED ORDER — AMIODARONE HCL 200 MG PO TABS
200.0000 mg | ORAL_TABLET | Freq: Two times a day (BID) | ORAL | Status: DC
  Administered 2022-01-16 – 2022-01-17 (×4): 200 mg via ORAL
  Filled 2022-01-16 (×4): qty 1

## 2022-01-16 NOTE — Progress Notes (Signed)
[facility] Progress Note    Assessment/ Plan:   AKI on CKD3a (bl Cr ~1.1) -AKI likely multifactorial: Secondary to acute CHF exacerbation and CIN.  Likely in ATN at this point. -Was nonoliguric however no robust response with Lasix.  Started CRRT on 5/29 especially as her kidney function is a limiting factor for further management of her cardiac conditions. S/P RIJ temp line placement on 5/29. Tolerated CRRT thus far, volume status is better. Discussed w/ HF, will stop CRRT today (discussed w/ RN). Maintain temp HD catheter for now. Moving forward, will monitor for renal recovery -Understandably, patient does not want long-term dialysis and hopefully this will not be the case given that she has a relatively okay baseline kidney function.  If it turns out that she does not have any renal recovery down the road, then would recommend palliative care consultation to address goals of care  Hyponatremia, severe, now improved -Thought to be secondary to CHF exacerbation in conjunction with AKI/free water excretion deficit -No real response with loop diuretics, tolvaptan, 3%NS. -Na up to 131 today, monitor for now, fluid restrict 1.5L/day  STEMI -multivessel disease -high risk PCI down the road  Acute sCHF -EF 20-25%-likely stress cardiomyopathy, EF now improved to 65-70%. On milrinone -UF as tolerated with CRRT  HAP -abx per primary service  COPD -per primary service  Discussed w/ primary service.   Subjective:   No acute events. Feels weak. Started on midodrine yesterday. Net neg around 1.6L. hemodynamics stable Weight up by 2kg (standing weight) which is odd especially as she does not look more volume overloaded. She is frustrated by this. Uop ~0.5L   Objective:   BP (!) 112/38   Pulse 65   Temp 98.3 F (36.8 C) (Oral)   Resp 16   Ht '5\' 6"'$  (1.676 m)   Wt 114.9 kg Comment: weighed x 3 with 2 different scales  SpO2 97%   BMI 40.89 kg/m   Intake/Output  Summary (Last 24 hours) at 01/16/2022 0813 Last data filed at 01/16/2022 0800 Gross per 24 hour  Intake 1712.76 ml  Output 3382 ml  Net -1669.24 ml   Weight change: 2.5 kg  Physical Exam: Gen:nad, sitting up in chair CVS:RRR Resp:diminished air entry bibasilar, no w/r/r/c, unlabored ZOX:WRUE Ext: no sig edema Neuro: awake, alert,  moves all ext spontaneously Dialysis access: RIJ temp HD cath: c/d/i  Imaging: DG Chest Port 1 View  Result Date: 01/14/2022 CLINICAL DATA:  Central line placement. EXAM: PORTABLE CHEST 1 VIEW COMPARISON:  01/12/2022 FINDINGS: Interval right jugular catheter with its tip in the superior vena cava. No pneumothorax. Stable enlarged cardiac silhouette and tortuous and calcified thoracic aorta. Stable prominence of the pulmonary vasculature and interstitial markings. Mild increase in patchy density at both lung bases. Proximal left humerus fixation hardware and old fracture. IMPRESSION: 1. Right jugular catheter tip in the superior vena cava without pneumothorax. 2. Increased bibasilar patchy atelectasis or pneumonia, left greater than right. 3. Stable cardiomegaly, pulmonary vascular congestion and chronic interstitial lung disease. Electronically Signed   By: Claudie Revering M.D.   On: 01/14/2022 09:22   ECHOCARDIOGRAM LIMITED  Result Date: 01/14/2022    ECHOCARDIOGRAM LIMITED REPORT   Patient Name:   MARTIKA EGLER Date of Exam: 01/14/2022 Medical Rec #:  [id number]     Height:       66.0 in Accession #:    [id number]    Weight:       267.0 lb Date of Birth:  08-22-1940     BSA:          2.261 m Patient Age:    81 years      BP:           131/38 mmHg Patient Gender: F             HR:           70 bpm. Exam Location:  Inpatient Procedure: Limited Echo, Color Doppler and Cardiac Doppler Indications:    Y70.62 Acute systolic (congestive) heart failure  History:        Patient has prior history of Echocardiogram examinations, most                 recent 01/09/2022. CAD; Risk  Factors:Hypertension.  Sonographer:    Raquel Sarna Senior RDCS Referring Phys: [place] Comments: Limited to reevaluate LV function IMPRESSIONS  1. Left ventricular ejection fraction, by estimation, is 65 to 70%. The left ventricle has normal function. The left ventricle has no regional wall motion abnormalities.  2. Right ventricular systolic function is normal. The right ventricular size is normal.  3. The mitral valve is normal in structure. Trivial mitral valve regurgitation. No evidence of mitral stenosis. Moderate mitral annular calcification.  4. The aortic valve is tricuspid.  5. The inferior vena cava is normal in size with greater than 50% respiratory variability, suggesting right atrial pressure of 3 mmHg.  6. Limited study to assess LV function; LV function normal and improved compared to previous. FINDINGS  Left Ventricle: Left ventricular ejection fraction, by estimation, is 65 to 70%. The left ventricle has normal function. The left ventricle has no regional wall motion abnormalities. The left ventricular internal cavity size was normal in size. There is  no left ventricular hypertrophy. Right Ventricle: The right ventricular size is normal. Right ventricular systolic function is normal. Left Atrium: Left atrial size was normal in size. Right Atrium: Right atrial size was normal in size. Pericardium: There is no evidence of pericardial effusion. Mitral Valve: The mitral valve is normal in structure. Moderate mitral annular calcification. Trivial mitral valve regurgitation. MV peak gradient, 11.7 mmHg. The mean mitral valve gradient is 5.0 mmHg. Tricuspid Valve: The tricuspid valve is normal in structure. Tricuspid valve regurgitation is trivial. Aortic Valve: The aortic valve is tricuspid. Aortic valve regurgitation is not visualized. Aortic valve sclerosis/calcification is present, without any evidence of aortic stenosis. Pulmonic Valve: The pulmonic valve was not well visualized.  Aorta: The aortic root is normal in size and structure. Venous: The inferior vena cava is normal in size with greater than 50% respiratory variability, suggesting right atrial pressure of 3 mmHg. IAS/Shunts: The interatrial septum was not assessed. Additional Comments: Limited study to assess LV function; LV function normal and improved compared to previous. RIGHT VENTRICLE RV S prime:     16.40 cm/s TAPSE (M-mode): 2.1 cm AORTIC VALVE LVOT Vmax:   138.00 cm/s LVOT Vmean:  92.300 cm/s LVOT VTI:    0.256 m MITRAL VALVE MV Peak grad: 11.7 mmHg  SHUNTS MV Mean grad: 5.0 mmHg   Systemic VTI: 0.26 m MV Vmax:      1.71 m/s MV Vmean:     111.0 cm/s Kirk Ruths MD Electronically signed by Kirk Ruths MD Signature Date/Time: 01/14/2022/10:10:24 AM    Final     Labs: BMET Recent Labs  Lab 01/13/22 2343 01/14/22 3762 01/14/22 8315 01/14/22 1128 01/14/22 1459 01/15/22 0430 01/15/22 1455 01/16/22 0358  NA 118*  118* 122* 119* 121* 123* 129* 131*  K 4.4 4.2 4.3  --  4.4 3.5 3.6 3.6  CL 86* 86* 89*  --  90* 92* 96* 100  CO2 18* 17* 19*  --  20* '22 25 23  '$ GLUCOSE 247* 254* 139*  --  180* 169* 165* 115*  BUN 50* 50* 53*  --  49* 32* 26* 22  CREATININE 3.68* 3.67* 3.69*  --  3.38* 2.18* 1.87* 1.44*  CALCIUM 7.7* 7.5* 8.1*  --  7.8* 7.8* 7.9* 7.6*  PHOS  --   --   --   --  5.4* 3.4 3.3  --    CBC Recent Labs  Lab 01/13/22 0410 01/14/22 0344 01/15/22 0430 01/16/22 0358  WBC 12.7* 12.7* 12.0* 11.5*  HGB 10.1* 9.4* 9.9* 9.5*  HCT 29.2* 27.5* 29.2* 27.9*  MCV 88.5 89.0 88.8 90.6  PLT 237 217 226 216    Medications:     amiodarone  200 mg Oral BID   aspirin  81 mg Oral Daily   atorvastatin  80 mg Oral Daily   Chlorhexidine Gluconate Cloth  6 each Topical Daily   docusate sodium  100 mg Oral BID   famotidine  10 mg Oral Daily   feeding supplement  237 mL Oral TID WC   fluticasone furoate-vilanterol  1 puff Inhalation Daily   guaiFENesin  600 mg Oral BID   insulin aspart  0-15 Units  Subcutaneous TID WC   insulin aspart  0-5 Units Subcutaneous QHS   midodrine  5 mg Oral TID WC   montelukast  10 mg Oral QHS   multivitamin with minerals  1 tablet Oral Daily   nicotine  14 mg Transdermal Daily   nitroGLYCERIN  0.1 mg Transdermal Daily   polyethylene glycol  17 g Oral Daily   senna  1 tablet Oral Daily   sodium chloride flush  10-40 mL Intracatheter Q12H   sodium chloride flush  3 mL Intravenous Q12H   sodium phosphate  1 enema Rectal Once   thiamine  100 mg Oral Daily      Gean Quint, MD [facility] 01/16/2022, 8:13 AM

## 2022-01-16 NOTE — Plan of Care (Signed)
Stable during shift.  PO Valium at end of dayshift, slept most of the night. Calm, cooperative and hopeful. OOB to recliner this am.   CRRT continues, no complications, negative approx 1.6L/24 hr. CVP 10-13. Foley for accurate I&O, wt up this am (verified with 2 scales), Borderline urine output approx 570 mL/24 hr. Compliant with fluid restrictions. Na 131, Renal numbers improving BUN = 22, Cr = 1.44.   Midodrine po started yesterday, BP stable (low diastolic/MAP but SBP > 947). Coox stable at 78.4 off milrinone gtt.   SR w 1 AVB, 60-70s, remains on amio gtt.   Heparin gtt, Hep level therapeutic.   Problem: Education: Goal: Understanding of cardiac disease, CV risk reduction, and recovery process will improve Outcome: Progressing   Problem: Activity: Goal: Ability to tolerate increased activity will improve Outcome: Progressing   Problem: Cardiac: Goal: Ability to achieve and maintain adequate cardiopulmonary perfusion will improve Outcome: Progressing Goal: Vascular access site(s) Level 0-1 will be maintained Outcome: Progressing   Problem: Education: Goal: Knowledge of General Education information will improve Description: Including pain rating scale, medication(s)/side effects and non-pharmacologic comfort measures Outcome: Progressing   Problem: Clinical Measurements: Goal: Will remain free from infection Outcome: Progressing Goal: Respiratory complications will improve Outcome: Progressing Goal: Cardiovascular complication will be avoided Outcome: Progressing   Problem: Coping: Goal: Level of anxiety will decrease Outcome: Progressing   Problem: Pain Managment: Goal: General experience of comfort will improve Outcome: Progressing   Problem: Safety: Goal: Ability to remain free from injury will improve Outcome: Progressing   Problem: Skin Integrity: Goal: Risk for impaired skin integrity will decrease Outcome: Progressing

## 2022-01-16 NOTE — Progress Notes (Addendum)
Patient ID: Brittany MOZINGO, female   DOB: Feb 12, 1941, 81 y.o.   MRN: [id number]     Advanced Heart Failure Rounding Note  PCP-Cardiologist: None   Subjective:    Started CRRT 05/29. 2.8L volume removed yesterday + 560 cc UOP, -1.6L for the day  ? Weight up 6 lb.  CVP 7-8  Na trending up, 117>118>121>123>129>131  MAPs 50s-60s, but SBP consistently 350K-938H (diastolic pressure low)  Maintaining SR on IV amio.  Feels okay. Had a little but of CP overnight but no recurrence this am. Denies dyspnea at rest.    Repeat echo 05/29: EF improved to 65-70%, RV okay   Objective:   Weight Range: 114.9 kg Body mass index is 40.89 kg/m.   Vital Signs:   Temp:  [97.9 F (36.6 C)-98.8 F (37.1 C)] 98.3 F (36.8 C) (05/31 0650) Pulse Rate:  [65-80] 65 (05/31 0700) Resp:  [13-22] 16 (05/31 0700) BP: (86-143)/(32-68) 112/38 (05/31 0700) SpO2:  [90 %-98 %] 97 % (05/31 0700) FiO2 (%):  [28 %] 28 % (05/30 0836) Weight:  [114.9 kg] 114.9 kg (05/31 0615) Last BM Date : 01/14/22  Weight change: Filed Weights   01/14/22 0400 01/15/22 0500 01/16/22 0615  Weight: 121.1 kg 112.4 kg 114.9 kg    Intake/Output:   Intake/Output Summary (Last 24 hours) at 01/16/2022 0731 Last data filed at 01/16/2022 0700 Gross per 24 hour  Intake 1721.12 ml  Output 3368 ml  Net -1646.88 ml      Physical Exam  CVP 7-8 General: Elderly, fatigued appearing HEENT: normal Neck: supple. no JVD. Carotids 2+ bilat; no bruits. R IJ HD cathter Cor: PMI nondisplaced. Regular rate & rhythm. No rubs, gallops or murmurs. Lungs: clear Abdomen: soft, nontender, nondistended. No hepatosplenomegaly.  Extremities: no cyanosis, clubbing, rash, 1+ edema Neuro: alert & orientedx3, cranial nerves grossly intact. moves all 4 extremities w/o difficulty. Affect pleasant      Telemetry   SR 60s-70s  Labs    CBC Recent Labs    01/15/22 0430 01/16/22 0358  WBC 12.0* 11.5*  HGB 9.9* 9.5*  HCT 29.2* 27.9*  MCV  88.8 90.6  PLT 226 829   Basic Metabolic Panel Recent Labs    01/15/22 0430 01/15/22 1455 01/16/22 0358  NA 123* 129* 131*  K 3.5 3.6 3.6  CL 92* 96* 100  CO2 '22 25 23  '$ GLUCOSE 169* 165* 115*  BUN 32* 26* 22  CREATININE 2.18* 1.87* 1.44*  CALCIUM 7.8* 7.9* 7.6*  MG 2.5*  --  2.5*  PHOS 3.4 3.3  --    Liver Function Tests Recent Labs    01/15/22 0430 01/15/22 1455 01/16/22 0358  AST 22  --  20  ALT 16  --  14  ALKPHOS 65  --  70  BILITOT 0.5  --  0.3  PROT 5.3*  --  5.0*  ALBUMIN 2.4* 2.4* 2.4*   No results for input(s): LIPASE, AMYLASE in the last 72 hours. Cardiac Enzymes No results for input(s): CKTOTAL, CKMB, CKMBINDEX, TROPONINI in the last 72 hours.  BNP: BNP (last 3 results) Recent Labs    01/08/22 2356  BNP 227.4*    ProBNP (last 3 results) No results for input(s): PROBNP in the last 8760 hours.   D-Dimer No results for input(s): DDIMER in the last 72 hours. Hemoglobin A1C No results for input(s): HGBA1C in the last 72 hours.  Fasting Lipid Panel No results for input(s): CHOL, HDL, LDLCALC, TRIG, CHOLHDL, LDLDIRECT in the last  72 hours.  Thyroid Function Tests No results for input(s): TSH, T4TOTAL, T3FREE, THYROIDAB in the last 72 hours.  Invalid input(s): FREET3   Other results:   Imaging    No results found.   Medications:     Scheduled Medications:  aspirin  81 mg Oral Daily   atorvastatin  80 mg Oral Daily   Chlorhexidine Gluconate Cloth  6 each Topical Daily   docusate sodium  100 mg Oral BID   famotidine  10 mg Oral Daily   feeding supplement  237 mL Oral TID WC   fluticasone furoate-vilanterol  1 puff Inhalation Daily   guaiFENesin  600 mg Oral BID   insulin aspart  0-15 Units Subcutaneous TID WC   insulin aspart  0-5 Units Subcutaneous QHS   midodrine  5 mg Oral TID WC   montelukast  10 mg Oral QHS   multivitamin with minerals  1 tablet Oral Daily   nicotine  14 mg Transdermal Daily   nitroGLYCERIN  0.1 mg  Transdermal Daily   polyethylene glycol  17 g Oral Daily   senna  1 tablet Oral Daily   sodium chloride flush  10-40 mL Intracatheter Q12H   sodium chloride flush  3 mL Intravenous Q12H   sodium phosphate  1 enema Rectal Once   thiamine  100 mg Oral Daily    Infusions:  sodium chloride Stopped (01/09/22 0046)   sodium chloride     amiodarone 30 mg/hr (01/16/22 0700)   ceFEPime (MAXIPIME) IV Stopped (01/15/22 2158)   heparin 1,350 Units/hr (01/16/22 0700)   prismasol BGK 2/2.5 dialysis solution 1,000 mL/hr at 01/16/22 0608   prismasol BGK 2/2.5 replacement solution 400 mL/hr at 01/16/22 0333   prismasol BGK 2/2.5 replacement solution 500 mL/hr at 01/16/22 0611    PRN Medications: Place/Maintain arterial line **AND** sodium chloride, acetaminophen, albuterol, diazepam, heparin, ondansetron (ZOFRAN) IV, oxyCODONE, pneumococcal 20-valent conjugate vaccine, sodium chloride, traMADol, zolpidem   Assessment/Plan   1. CAD: Patient presented with ACS. Peak HS-TnI 3557. LHC with 3VD, 50-60% dLM, ?90% ostial LAD, 75% stenosis at bifurcation of mLAD and D, 75% stenosis at bifurcation of mLCx and OM.  The ostial LAD is not completely laid out so not sure on review that it is truly critical.  CABG would be a consideration if LAD disease is severe, may require FFR to determine this.  She, however, may be too high risk with severe ischemic cardiomyopathy, COPD, and renal dysfunction. PCI distal left main into LAD also an option if FFR shows severe disease.  For now, medical management given current clinical issues.   - ASA 81 - NTG patch - Continue heparin gtt - atorvastatin 80 daily - When she has stabilized medically, would favor repeat cath with FFR LM into LAD to assess significance of distal LM/ostial LAD disease prior to CABG vs PCI. This may be a while in the future.  2. Acute systolic CHF: Patient has CAD, but not sure it explains the extent of her cardiomyopathy.  ?Stress (Takotsubo-type)  CMP on top of pre-existing CAD.  Low output on initial RHC with CI 1.8.  Filling pressures significantly elevated on RHC as well.  Echo initially with EF 20-25%, mildly decreased RV systolic function, trivial MR, dilated IVC. Repeat echo 05/29 with improvement in EF to 65-70% => suspect Takotsubo CMP. - Creatinine up to 2.8 -> 3.4 -> 3.67 (baseline ~1.7). - Co-ox stable, 78% off milrinone.  - Volume currently managed with CVVH. CVP 7-8. Dr. Aundra Dubin discussed  with Nephrology. Plan to stop CVVH today and see how she does. - Continue midodrine 5 TID. Has wide pulse pressure, SBP okay but diastolic pressure 35T-01X - GDMT eventually - TED hose 3.  Atrial fibrillation: Paroxysmal, noted initially this hospitalization.  Now in NSR. - Continue heparin gtt.  - Off inotrope. Switch IV amio to po 200 mg BID 4.  AKI: Creatinine up to 1.65 => 1.9 => 2.4 => 2.8 => 3.4 => 3.67. Suspect cardiorenal/ATN with low output + CIN.   - With persistent hyponatremia not responding well to 3% saline and volume not significantly elevated to suggest benefit from tolvaptan, started CVVH on 05/29.  She would not want long-term HD.   - Tolerated CVVH well. Na improved. CVP 7-8. After discussion with Nephrology planing to stop CVVH today as above - Keep MAPs >= 65, SBP >= 793 (low diastolics) 5. ID: CXR with R>L lower lobe infiltrates.  WBCs 20 => 12 => 11.5 => 12 +> 11.5K, no fever.  Afebrile.  Sputum culture with GPCs -> normal flora.  - Covering for HAP with cefepime. Can stop after today. - Continue pulmonary toilet 6. COPD: Patient was a smoker up until admission.  Bedside PFTs 5/23 FEV 1 1.09L (53%) FVC 1.68 L (61%) FEF 25-75 0.54 (39%) - aggressive pulmonary toilet 7. Hyponatremia:  Na 126 -> 122 -> 120 -> 117 -> 118.  She has not responded to tolvaptan initially or 3% saline. She is not confused. Now improving with CVVH, up to 131 this am. - Limit fluid intake.  8. Deconditioning - ambulate - Needs aggressive  PT/OT  Length of Stay: 7  FINCH, LINDSAY N, PA-C  01/16/2022, 7:31 AM  Advanced Heart Failure Team Pager [phone] (M-F; 7a - 5p)  Please contact [facility] for night-coverage after hours (5p -7a ) and weekends on [email or web address]   Patient seen with PA, agree with the above note.   Na up to 131 with CVVH.  CVP down to 7-8.  She remains in NSR.  SBP 130s but wide pulse pressure.   General: NAD Neck: No JVD, no thyromegaly or thyroid nodule.  Lungs: Clear to auscultation bilaterally with normal respiratory effort. CV: Nondisplaced PMI.  Heart regular S1/S2, no S3/S4, no murmur.  No peripheral edema.   Abdomen: Soft, nontender, no hepatosplenomegaly, no distention.  Skin: Intact without lesions or rashes.  Neurologic: Alert and oriented x 3.  Psych: Normal affect. Extremities: No clubbing or cyanosis.  HEENT: Normal.   Discussed with nephrology, will stop CVVH today and follow for renal recovery.  Will maintain fluid restriction for hyponatremia (< 1.5 L/day).   Uncertain cause for wide pulse pressure, she does not have significant aortic insufficiency.  Will continue current midodrine 5 mg tid for now.   Transition to po amiodarone today.   Stop cefepime after today (empiric treatment for PNA).   Mobilize.   CRITICAL CARE Performed by: Loralie Champagne  Total critical care time: 35 minutes  Critical care time was exclusive of separately billable procedures and treating other patients.  Critical care was necessary to treat or prevent imminent or life-threatening deterioration.  Critical care was time spent personally by me on the following activities: development of treatment plan with patient and/or surrogate as well as nursing, discussions with consultants, evaluation of patient's response to treatment, examination of patient, obtaining history from patient or surrogate, ordering and performing treatments and interventions, ordering and review of laboratory studies, ordering and  review of radiographic studies, pulse oximetry  and re-evaluation of patient's condition.   Loralie Champagne 01/16/2022 9:51 AM

## 2022-01-16 NOTE — Progress Notes (Signed)
Inpatient Rehab Admissions Coordinator:  ? ?Per therapy recommendations,  patient was screened for CIR candidacy by Lindley Stachnik, MS, CCC-SLP. At this time, Pt. Appears to be a a potential candidate for CIR. I will place   order for rehab consult per protocol for full assessment. Please contact me any with questions. ? ?Carry Ortez, MS, CCC-SLP ?Rehab Admissions Coordinator  ?[phone] (celll) ?[phone] (office) ? ?

## 2022-01-16 NOTE — Progress Notes (Signed)
Physical Therapy Treatment Patient Details Name: Brittany Jackson MRN: [id number] DOB: 1941/02/01 Today's Date: 01/16/2022   History of Present Illness Pt is an 81 y.o. female admitted 01/08/22 with STEMI, PNA, afib. S/p emergent RHC with multivessel disease. Per CTS, does not currently meet criteria for CABG/MVR; may consider high risk PCI vs repeat cath. Pt with AKI; CRRT initiated 5/29-5/31. PMH includes current smoker, COPD, CHF, HTN.   PT Comments    Pt progressing with mobility. Today's session focused on gait training with rollator, ambulation for improving strength and activity tolerance. Pt tolerated short bout of hallway ambulation with min-modA for balance; pt demonstrates poor safety awareness and decreased attention (difficult to determine true cognitive impairment vs HOH vs desire). Pt remains limited by generalized weakness, decreased activity tolerance, and impaired balance strategies/postural reactions. Continue to recommend AIR-level therapies to maximize functional mobility and independence prior to return home.  SpO2 down to 88% on RA with ambulation; >/92% on RA with seated rest    Recommendations for follow up therapy are one component of a multi-disciplinary discharge planning process, led by the attending physician.  Recommendations may be updated based on patient status, additional functional criteria and insurance authorization.  Follow Up Recommendations  Acute inpatient rehab (3hours/day)     Assistance Recommended at Discharge Intermittent Supervision/Assistance  Patient can return home with the following A little help with walking and/or transfers;A little help with bathing/dressing/bathroom;Help with stairs or ramp for entrance;Assistance with cooking/housework   Equipment Recommendations  Other (comment) (TBD - rollator, shower chair)    Recommendations for Other Services       Precautions / Restrictions Precautions Precautions: Fall;Other  (comment) Precaution Comments: Watch SpO2 (does not wear baseline) Restrictions Weight Bearing Restrictions: No     Mobility  Bed Mobility               General bed mobility comments: Received sitting in recliner    Transfers Overall transfer level: Needs assistance Equipment used: None, Rollator (4 wheels) Transfers: Sit to/from Stand Sit to Stand: Min guard           General transfer comment: repeated sit<>stands from recliner without DME, min guard for balance; additional trial with rollator, educ on lock/unlocking brakes    Ambulation/Gait Ambulation/Gait assistance: Min guard, Min assist, Mod assist Gait Distance (Feet): 110 Feet Assistive device: Rollator (4 wheels) Gait Pattern/deviations: Step-through pattern, Decreased stride length, Drifts right/left, Staggering left, Trunk flexed       General Gait Details: hurried, unsteady gait with rollator and intermittent min-modA to manage rollator and prevent LOB; pt with apparent poor safety awareness and not following directions despite multiple cues to slow down; pt running into trashcan when back in room requiring max verbal cues for safety/sequencing   Stairs             Wheelchair Mobility    Modified Rankin (Stroke Patients Only)       Balance Overall balance assessment: Needs assistance Sitting-balance support: No upper extremity supported, Feet supported Sitting balance-Leahy Scale: Fair     Standing balance support: Bilateral upper extremity supported, During functional activity, Reliant on assistive device for balance, Single extremity supported Standing balance-Leahy Scale: Fair Standing balance comment: can static stand without UE support; static and dynamic stability improved with UE support                            Cognition Arousal/Alertness: Awake/alert Behavior During Therapy: Flat affect Overall  Cognitive Status: No family/caregiver present to determine baseline  cognitive functioning Area of Impairment: Attention, Safety/judgement, Awareness, Following commands                   Current Attention Level: Selective   Following Commands: Follows multi-step commands inconsistently Safety/Judgement: Decreased awareness of safety, Decreased awareness of deficits Awareness: Emergent   General Comments: Difficult to determine true cognitive impairment (suspect not) vs. HOH vs. desire to incorporate feedback/education        Exercises Other Exercises Other Exercises: 5x repeated sit<>stand from recliner (reliant on UE support)    General Comments General comments (skin integrity, edema, etc.): pt's son Gaspar Bidding) present at beginning of session, then stepping out; SpO2 down to 88% on RA with mobility, returning to >/92% on RA with seated rest      Pertinent Vitals/Pain Pain Assessment Pain Assessment: Faces Faces Pain Scale: Hurts a little bit Pain Location: generalized Pain Descriptors / Indicators: Discomfort, Tiring Pain Intervention(s): Monitored during session, Limited activity within patient's tolerance    Home Living                          Prior Function            PT Goals (current goals can now be found in the care plan section) Progress towards PT goals: Progressing toward goals    Frequency    Min 3X/week      PT Plan Current plan remains appropriate    Co-evaluation              AM-PAC PT "6 Clicks" Mobility   Outcome Measure  Help needed turning from your back to your side while in a flat bed without using bedrails?: A Little Help needed moving from lying on your back to sitting on the side of a flat bed without using bedrails?: A Little Help needed moving to and from a bed to a chair (including a wheelchair)?: A Little Help needed standing up from a chair using your arms (e.g., wheelchair or bedside chair)?: A Little Help needed to walk in hospital room?: A Lot Help needed climbing 3-5  steps with a railing? : Total 6 Click Score: 15    End of Session Equipment Utilized During Treatment: Gait belt Activity Tolerance: Patient tolerated treatment well;Patient limited by fatigue Patient left: in chair;with call bell/phone within reach;with nursing/sitter in room Nurse Communication: Mobility status PT Visit Diagnosis: Unsteadiness on feet (R26.81);Muscle weakness (generalized) (M62.81)     Time: 4010-2725 (subtract 5 min time for RN removing CRRT) PT Time Calculation (min) (ACUTE ONLY): 30 min  Charges:  $Gait Training: 8-22 mins $Therapeutic Exercise: 8-22 mins                     Mabeline Caras, PT, DPT Acute Rehabilitation Services  Pager [phone] Office [place] 01/16/2022, 12:20 PM

## 2022-01-16 NOTE — Progress Notes (Signed)
Occupational Therapy Treatment Patient Details Name: Brittany Jackson MRN: [id number] DOB: 22-Dec-1940 Today's Date: 01/16/2022   History of present illness Pt is an 81 y.o. female admitted 01/08/22 with STEMI, PNA, afib. S/p emergent RHC with multivessel disease. Per CTS, does not currently meet criteria for CABG/MVR; may consider high risk PCI vs repeat cath. Pt with AKI; CRRT initiated 5/29-5/31. PMH includes current smoker, COPD, CHF, HTN.   OT comments  Patient with good progress toward patient focused goals.  Continues to complain of poor activity tolerance and generalized weakness, but is improving with general mobility.  Patient continues to need up to Mod A with lower body ADL, hip kit will assist with that, but mobility is Min Guard at [employer] level.  AIR continues to be recommended for post acute rehab, and OT will continue their efforts in the acute setting.     Recommendations for follow up therapy are one component of a multi-disciplinary discharge planning process, led by the attending physician.  Recommendations may be updated based on patient status, additional functional criteria and insurance authorization.    Follow Up Recommendations  Acute inpatient rehab (3hours/day)    Assistance Recommended at Discharge Frequent or constant Supervision/Assistance  Patient can return home with the following  A little help with walking and/or transfers;A lot of help with bathing/dressing/bathroom;Assistance with cooking/housework;Assistance with feeding;Help with stairs or ramp for entrance;Assist for transportation;Direct supervision/assist for financial management;Direct supervision/assist for medications management   Equipment Recommendations       Recommendations for Other Services      Precautions / Restrictions Precautions Precautions: Fall;Other (comment) Precaution Comments: Watch SpO2 (does not wear baseline) Restrictions Weight Bearing Restrictions: No       Mobility Bed  Mobility Overal bed mobility: Needs Assistance Bed Mobility: Sit to Supine       Sit to supine: Supervision        Transfers Overall transfer level: Needs assistance Equipment used: Rolling walker (2 wheels) Transfers: Sit to/from Stand, Bed to chair/wheelchair/BSC Sit to Stand: Supervision     Step pivot transfers: Min guard           Balance Overall balance assessment: Needs assistance Sitting-balance support: No upper extremity supported, Feet supported Sitting balance-Leahy Scale: Good     Standing balance support: Reliant on assistive device for balance Standing balance-Leahy Scale: Fair                             ADL either performed or assessed with clinical judgement   ADL Overall ADL's : Needs assistance/impaired Eating/Feeding: Independent;Sitting   Grooming: Set up;Sitting;Oral care;Wash/dry face;Wash/dry hands           Upper Body Dressing : Minimal assistance;Sitting   Lower Body Dressing: Moderate assistance;Sit to/from stand   Toilet Transfer: Min guard;Rolling walker (2 wheels);Comfort height toilet                  Extremity/Trunk Assessment Upper Extremity Assessment Upper Extremity Assessment: Generalized weakness   Lower Extremity Assessment Lower Extremity Assessment: Defer to PT evaluation   Cervical / Trunk Assessment Cervical / Trunk Assessment: Normal    Vision Baseline Vision/History: 1 Wears glasses Patient Visual Report: No change from baseline     Perception Perception Perception: Not tested   Praxis Praxis Praxis: Not tested    Cognition Arousal/Alertness: Awake/alert Behavior During Therapy: Flat affect   Area of Impairment: Safety/judgement  Safety/Judgement: Decreased awareness of safety, Decreased awareness of deficits     General Comments: not as impulsive with OT this session        Exercises      Shoulder Instructions       General  Comments O2 replaced as patient sat's dropped to 87% on RA    Pertinent Vitals/ Pain       Pain Assessment Pain Assessment: No/denies pain                                                          Frequency  Min 2X/week        Progress Toward Goals  OT Goals(current goals can now be found in the care plan section)  Progress towards OT goals: Progressing toward goals  Acute Rehab OT Goals OT Goal Formulation: With patient Time For Goal Achievement: 01/28/22 Potential to Achieve Goals: Good  Plan Discharge plan remains appropriate    Co-evaluation                 AM-PAC OT "6 Clicks" Daily Activity     Outcome Measure   Help from another person eating meals?: None Help from another person taking care of personal grooming?: None Help from another person toileting, which includes using toliet, bedpan, or urinal?: A Lot Help from another person bathing (including washing, rinsing, drying)?: A Lot Help from another person to put on and taking off regular upper body clothing?: A Little Help from another person to put on and taking off regular lower body clothing?: A Lot 6 Click Score: 17    End of Session Equipment Utilized During Treatment: Rolling walker (2 wheels)  OT Visit Diagnosis: Unsteadiness on feet (R26.81);Other abnormalities of gait and mobility (R26.89);Muscle weakness (generalized) (M62.81)   Activity Tolerance Patient limited by fatigue   Patient Left in bed;with call bell/phone within reach   Nurse Communication Mobility status        Time: 0211-1735 OT Time Calculation (min): 17 min  Charges: OT General Charges $OT Visit: 1 Visit OT Treatments $Self Care/Home Management : 8-22 mins  01/16/2022  RP, OTR/L  Acute Rehabilitation Services  Office:  [phone]   Metta Clines 01/16/2022, 12:21 PM

## 2022-01-16 NOTE — Progress Notes (Signed)
ANTICOAGULATION CONSULT NOTE - Follow Up Consult  Pharmacy Consult for heparin Indication: chest pain/ACS and atrial fibrillation  Allergies  Allergen Reactions   Codeine Other (See Comments)    Unknown   Meprobamate Swelling   Sulfa Antibiotics Other (See Comments)    Unknown    Patient Measurements: Height: '5\' 6"'$  (167.6 cm) Weight: 114.9 kg (253 lb 4.9 oz) (weighed x 3 with 2 different scales) IBW/kg (Calculated) : 59.3 Heparin Dosing Weight: 85.7 kg  Vital Signs: Temp: 98.3 F (36.8 C) (05/31 0650) Temp Source: Oral (05/31 0650) BP: 112/38 (05/31 0700) Pulse Rate: 65 (05/31 0700)  Labs: Recent Labs    01/14/22 0344 01/14/22 0421 01/14/22 0752 01/15/22 0430 01/15/22 1455 01/16/22 0358  HGB 9.4*  --   --  9.9*  --  9.5*  HCT 27.5*  --   --  29.2*  --  27.9*  PLT 217  --   --  226  --  216  HEPARINUNFRC  --  0.44  --  0.54  --  0.48  CREATININE 3.67*  --    < > 2.18* 1.87* 1.44*   < > = values in this interval not displayed.     Estimated Creatinine Clearance: 38.1 mL/min (A) (by C-G formula based on SCr of 1.44 mg/dL (H)).  Assessment: 81 y.o. female admitted with Afib and STEMI s/p cath and awaiting CABG versus high-risk PCI. Patient is not on any anticoagulants PTA. Pharmacy has been consulted for heparin.  Heparin level remains therapeutic this morning at 0.48, CBC stable.  Goal of Therapy:  Heparin level 0.3-0.7 units/ml Monitor platelets by anticoagulation protocol: Yes   Plan:  Continue IV heparin at 1350 units/hr Daily heparin level and CBC  Arrie Senate, PharmD, [place], [facility] Clinical Pharmacist [phone] Please check AMION for all [facility] Pharmacy numbers 01/16/2022

## 2022-01-17 DIAGNOSIS — I5041 Acute combined systolic (congestive) and diastolic (congestive) heart failure: Secondary | ICD-10-CM | POA: Diagnosis not present

## 2022-01-17 LAB — COOXEMETRY PANEL
Carboxyhemoglobin: 1.5 % (ref 0.5–1.5)
Carboxyhemoglobin: 1.6 % — ABNORMAL HIGH (ref 0.5–1.5)
Methemoglobin: 0.8 % (ref 0.0–1.5)
Methemoglobin: <0.7 % (ref 0.0–1.5)
O2 Saturation: 68 %
O2 Saturation: 70.7 %
Total hemoglobin: 9.9 g/dL — ABNORMAL LOW (ref 12.0–16.0)
Total hemoglobin: 10.5 g/dL — ABNORMAL LOW (ref 12.0–16.0)

## 2022-01-17 LAB — CBC
HCT: 32.5 % — ABNORMAL LOW (ref 36.0–46.0)
Hemoglobin: 10.7 g/dL — ABNORMAL LOW (ref 12.0–15.0)
MCH: 30.1 pg (ref 26.0–34.0)
MCHC: 32.9 g/dL (ref 30.0–36.0)
MCV: 91.5 fL (ref 80.0–100.0)
Platelets: 252 10*3/uL (ref 150–400)
RBC: 3.55 MIL/uL — ABNORMAL LOW (ref 3.87–5.11)
RDW: 14.5 % (ref 11.5–15.5)
WBC: 14.4 10*3/uL — ABNORMAL HIGH (ref 4.0–10.5)
nRBC: 0 % (ref 0.0–0.2)

## 2022-01-17 LAB — HEPARIN LEVEL (UNFRACTIONATED): Heparin Unfractionated: 0.23 IU/mL — ABNORMAL LOW (ref 0.30–0.70)

## 2022-01-17 LAB — GLUCOSE, CAPILLARY
Glucose-Capillary: 90 mg/dL (ref 70–99)
Glucose-Capillary: 93 mg/dL (ref 70–99)
Glucose-Capillary: 112 mg/dL — ABNORMAL HIGH (ref 70–99)
Glucose-Capillary: 88 mg/dL (ref 70–99)

## 2022-01-17 LAB — COMPREHENSIVE METABOLIC PANEL
ALT: 15 U/L (ref 0–44)
AST: 20 U/L (ref 15–41)
Albumin: 2.4 g/dL — ABNORMAL LOW (ref 3.5–5.0)
Alkaline Phosphatase: 65 U/L (ref 38–126)
Anion gap: 7 (ref 5–15)
BUN: 27 mg/dL — ABNORMAL HIGH (ref 8–23)
CO2: 24 mmol/L (ref 22–32)
Calcium: 8.1 mg/dL — ABNORMAL LOW (ref 8.9–10.3)
Chloride: 99 mmol/L (ref 98–111)
Creatinine, Ser: 2.29 mg/dL — ABNORMAL HIGH (ref 0.44–1.00)
GFR, Estimated: 21 mL/min — ABNORMAL LOW (ref >60)
Glucose, Bld: 97 mg/dL (ref 70–99)
Potassium: 3.8 mmol/L (ref 3.5–5.1)
Sodium: 130 mmol/L — ABNORMAL LOW (ref 135–145)
Total Bilirubin: 0.3 mg/dL (ref 0.3–1.2)
Total Protein: 5.3 g/dL — ABNORMAL LOW (ref 6.5–8.1)

## 2022-01-17 LAB — MAGNESIUM: Magnesium: 2.3 mg/dL (ref 1.7–2.4)

## 2022-01-17 MED ORDER — POTASSIUM CHLORIDE CRYS ER 20 MEQ PO TBCR
20.0000 meq | EXTENDED_RELEASE_TABLET | Freq: Once | ORAL | Status: AC
  Administered 2022-01-17: 20 meq via ORAL
  Filled 2022-01-17: qty 1

## 2022-01-17 MED ORDER — FUROSEMIDE 10 MG/ML IJ SOLN
60.0000 mg | Freq: Once | INTRAMUSCULAR | Status: AC
  Administered 2022-01-17: 60 mg via INTRAVENOUS
  Filled 2022-01-17: qty 6

## 2022-01-17 MED ORDER — HYDROCORTISONE 1 % EX CREA
TOPICAL_CREAM | CUTANEOUS | Status: DC | PRN
  Administered 2022-01-20: 1 via TOPICAL
  Filled 2022-01-17 (×2): qty 28
  Filled 2022-01-17: qty 56
  Filled 2022-01-17 (×2): qty 28

## 2022-01-17 MED ORDER — PROSOURCE PLUS PO LIQD
30.0000 mL | Freq: Two times a day (BID) | ORAL | Status: DC
  Administered 2022-01-18 – 2022-01-22 (×10): 30 mL via ORAL
  Filled 2022-01-17 (×9): qty 30

## 2022-01-17 MED ORDER — DIPHENHYDRAMINE HCL 25 MG PO CAPS
25.0000 mg | ORAL_CAPSULE | Freq: Once | ORAL | Status: AC
  Administered 2022-01-17: 25 mg via ORAL
  Filled 2022-01-17: qty 1

## 2022-01-17 NOTE — Progress Notes (Signed)
Nutrition Follow-up  DOCUMENTATION CODES:   Obesity unspecified  INTERVENTION:   Family to bring in [place] protein shake from home for patient to consume; pt does not like any other protein shakes, fair life is not on hospital formulary, Each fair life contains 150 kcals, 30 g of protein  Trial 30 ml ProSource Plus BID, each supplement provides 100 kcals and 15 grams protein.   Continue Regular diet with fluid restriction  Continue MVI with Minerals daily  NUTRITION DIAGNOSIS:   Increased nutrient needs related to acute illness as evidenced by estimated needs.   GOAL:   Patient will meet greater than or equal to 90% of their needs  Not met  MONITOR:   PO intake, Supplement acceptance, Diet advancement, Labs, Weight trends, I & O's  REASON FOR ASSESSMENT:   Consult Assessment of nutrition requirement/status, Poor PO  ASSESSMENT:   Pt admitted with SOB secondary to STEMI. PMH significant for CHF, HTN, tobacco abuse and urticaria.  5/24: s/p R/LHC with coronary angiography 5/29: CRRT initiated, Repeat ECHO with EF improved to 65-70% 5/31: CRRT stopped  CRRT stopped yesterday. Pt indicates she would not want long term HD  Recorded po intake 25% at breakfast yesterday, 50% at lunch and 100% at dinner. Pt ate 75% of dinner on 5/30. Appetite appears to be improving.   However on visit today, pt had not touched her breakfast tray. Family had brought in some watermelon, pt took some bites.   Pt is not drinking Ensure shakes; Pt will drink [place] brought in by family  Labs: sodium 130 (L), Creatinine 2.29, BUN 27 Meds: ss novolog, MVI with minerals, miralax, colace    Diet Order:   Diet Order             Diet regular Room service appropriate? Yes; Fluid consistency: Thin; Fluid restriction: 1500 mL Fluid  Diet effective now                   EDUCATION NEEDS:   Education needs have been addressed  Skin:  Skin Assessment: Reviewed RN  Assessment  Last BM:  5/29  Height:   Ht Readings from Last 1 Encounters:  01/12/22 5' 6"  (1.676 m)    Weight:   Wt Readings from Last 1 Encounters:  01/17/22 120 kg    Ideal Body Weight:  59.1 kg  BMI:  Body mass index is 42.7 kg/m.  Estimated Nutritional Needs:   Kcal:  1800-2000  Protein:  90-105g  Fluid:  >/=1.8L   Kerman Passey MS, RDN, LDN, CNSC Registered Dietitian III Clinical Nutrition RD Pager and On-Call Pager Number Located in [place]

## 2022-01-17 NOTE — Progress Notes (Signed)
[facility] Progress Note    Assessment/ Plan:   AKI on CKD3a (bl Cr ~1.1) -AKI likely multifactorial: Secondary to acute CHF exacerbation and CIN.  Likely in ATN at this point. -Was nonoliguric however no robust response with Lasix.  Started CRRT on 5/29 especially as her kidney function is a limiting factor for further management of her cardiac conditions. S/P RIJ temp line placement on 5/29.  -s/p CRRT from 5/29-5/31/23. Urine output is okay, lasix '60mg'$  IV x 1 dose today. Maintain temp HD line for now. I am hoping her kidney function will at least stabilize or she'll have further renal recovery -Understandably, patient does not want long-term dialysis and hopefully this will not be the case given that she has a relatively okay baseline kidney function.  If kidney function worsens to the point where she is going to require further dialysis, then would recommend palliative care consultation to address goals of care  Hyponatremia, severe, now improved -Thought to be secondary to CHF exacerbation in conjunction with AKI/free water excretion deficit -No real response with loop diuretics, tolvaptan, 3%NS initially -Na up to 130 today, monitor for now, fluid restrict 1.5L/day. Lasix x 1 dose today  STEMI -multivessel disease -high risk PCI down the road  Acute sCHF -EF 20-25%-likely stress cardiomyopathy, EF now improved to 65-70%. On midodrine -agree w/ lasix today per HF-will see if this helps augment UOP  HAP -abx per primary service  COPD -per primary service  Discussed w/ primary service.   Subjective:   No acute events. Off crrt. Feels tired after walking this AM. She denies any fevers, chills, chest pain, worsening SOB, n/v, dysgeusia, loss of appetite, new shakes/tremors. She does reiterate today that she does not want long term HD.  Uop 720cc Cvp ~9   Objective:   BP (!) 128/40   Pulse 67   Temp 98.4 F (36.9 C) (Oral)   Resp 20   Ht '5\' 6"'$  (1.676 m)    Wt 120 kg Comment: reweighed multiple times?  SpO2 91%   BMI 42.70 kg/m   Intake/Output Summary (Last 24 hours) at 01/17/2022 0758 Last data filed at 01/17/2022 0700 Gross per 24 hour  Intake 2190.82 ml  Output 1245 ml  Net 945.82 ml   Weight change: 5.1 kg  Physical Exam: Gen:nad, in bed CVS:RRR Resp: cta bl, no w/r/r/c, unlabored EHO:ZYYQ Ext: no sig edema Neuro: awake, alert,  moves all ext spontaneously Dialysis access: RIJ temp HD cath: c/d/i  Imaging: No results found.  Labs: BMET Recent Labs  Lab 01/14/22 0752 01/14/22 1128 01/14/22 1459 01/15/22 0430 01/15/22 1455 01/16/22 0358 01/16/22 1636 01/17/22 0446  NA 122* 119* 121* 123* 129* 131* 133* 130*  K 4.3  --  4.4 3.5 3.6 3.6 3.9 3.8  CL 89*  --  90* 92* 96* 100 100 99  CO2 19*  --  20* '22 25 23 27 24  '$ GLUCOSE 139*  --  180* 169* 165* 115* 117* 97  BUN 53*  --  49* 32* 26* 22 24* 27*  CREATININE 3.69*  --  3.38* 2.18* 1.87* 1.44* 1.92* 2.29*  CALCIUM 8.1*  --  7.8* 7.8* 7.9* 7.6* 8.1* 8.1*  PHOS  --   --  5.4* 3.4 3.3  --  2.8  --    CBC Recent Labs  Lab 01/14/22 0344 01/15/22 0430 01/16/22 0358 01/17/22 0446  WBC 12.7* 12.0* 11.5* 14.4*  HGB 9.4* 9.9* 9.5* 10.7*  HCT 27.5* 29.2* 27.9* 32.5*  MCV 89.0 88.8 90.6 91.5  PLT 217 226 216 252    Medications:     amiodarone  200 mg Oral BID   aspirin  81 mg Oral Daily   atorvastatin  80 mg Oral Daily   Chlorhexidine Gluconate Cloth  6 each Topical Daily   docusate sodium  100 mg Oral BID   famotidine  10 mg Oral Daily   feeding supplement  237 mL Oral TID WC   fluticasone furoate-vilanterol  1 puff Inhalation Daily   furosemide  60 mg Intravenous Once   guaiFENesin  600 mg Oral BID   insulin aspart  0-15 Units Subcutaneous TID WC   insulin aspart  0-5 Units Subcutaneous QHS   midodrine  5 mg Oral TID WC   montelukast  10 mg Oral QHS   multivitamin with minerals  1 tablet Oral Daily   nicotine  14 mg Transdermal Daily   polyethylene glycol   17 g Oral Daily   potassium chloride  20 mEq Oral Once   sodium chloride flush  10-40 mL Intracatheter Q12H   sodium chloride flush  3 mL Intravenous Q12H   sodium phosphate  1 enema Rectal Once   thiamine  100 mg Oral Daily      Gean Quint, MD [facility] 01/17/2022, 7:58 AM

## 2022-01-17 NOTE — Progress Notes (Signed)
CARDIAC REHAB PHASE I   Offered to walk with pt. Pt requesting not to leave the room until she had her make-up on. Pt reminiscent of her life. Adamant she does not want to go anywhere but home. Provided listening ear along with support and encouragement. Will continue to follow and ambulate as able.  [phone] Rufina Falco, RN BSN 01/17/2022 2:57 PM

## 2022-01-17 NOTE — PMR Pre-admission (Shared)
PMR Admission Coordinator Pre-Admission Assessment  Patient: Brittany Jackson is an 81 y.o., female MRN: [id number] DOB: 1941/06/03 Height: 5' 6"  (167.6 cm) Weight: 120 kg (reweighed multiple times?)  Insurance Information HMO:     PPO:      PCP:      IPA:      80/20:      OTHER:  PRIMARY: Medicare A and B      Policy#: [id number]      Subscriber: patient CM Name:        Phone#:       Fax#:   Pre-Cert#:        Employer: Retired Benefits:  Phone #:       Name: Checked in passport one source [employer] Date: 12/18/03     Deduct: $1600      Out of Pocket Max: None      Life Max: N/A CIR: 100%      SNF: 100 days Outpatient: 80%     Co-Pay: 20% Home Health: 100%      Co-Pay: none DME: 80%     Co-Pay: 20% Providers: patient's choice  SECONDARY: ***      Policy#: ***     Phone#: ***  Financial Counselor:       Phone#:   The "Data Collection Information Summary" for patients in Inpatient Rehabilitation Facilities with attached "Privacy Act [place] Records" was provided and verbally reviewed with: Patient  Emergency Contact Information Contact Information     Name Relation Home Work Mobile   [place] Daughter [phone]  [phone]   Genella Rife Niece [phone]     Karl Ito Niece   [phone]   Fabio Neighbors   [phone]       Current Medical History  Patient Admitting Diagnosis: Debility, Stemi, AKI, CHF  History of Present Illness: An 81 y.o. female who presents to the Emergency Department at [facility] on 01/08/22 complaining of sob, chest pain.  She presents to the ED by EMS for evaluation of shortness of breath and chest pain.  Symptoms started 3 hours prior to arrival when she was laying mulch. She reports severe difficulty breathing.  She has associated chest pain.  No significant cough.  No fevers, nausea, vomiting, leg swelling or pain.  She has a history of COPD, CHF, hypertension. EMS reports sats of 86% on room air.  She was placed on a  nonrebreather.  She was given a DuoNeb prior to ED arrival with no significant improvement in her symptoms.  Patient was transferred from [facility] to [facility] 01/09/22 with BiPap.  Of note, patient started having shortness of breath and chest pain while laying mulch in her yard late yesterday afternoon. Her dyspnea has progressively worsening and she called EMS and brought her to the [facility] ED. When patient arrived at OSH, she was tachycardia, tachypnea and hypertensive (191/144mHg). Patient was started nitro gtt and placed on Bipap. [facility] due to tachycardia. She was given 2.581mIV metoprolol and her HR improved, and EKG demonstrated inferolateral STEMI. Patient was transferred for emergent coronary angiogram/angioplasty.  Per CTS, does not currently meet criteria for CABG/MVR; may consider high risk PCI vs repeat cath. Pt with AKI; CRRT initiated 5/29-5/31. PMH includes current smoker, COPD, CHF, HTN. PT/OT evaluations were completed with recommendations for acute inpatient rehab admission.  [facility] coordinator and physician.  Past Medical History  Past Medical History:  Diagnosis  Date   CHF (congestive heart failure) (HCC)    Hypertension    Urticaria     Has the patient had major surgery during 100 days prior to admission? No  Family History   family history includes Hypertension in her sister.  Current Medications  Current Facility-Administered Medications:    (feeding supplement) PROSource Plus liquid 30 mL, 30 mL, Oral, BID BM, Larey Dresser, MD   0.9 %  sodium chloride infusion, , Intravenous, Continuous, Leonie Man, MD, Stopped at 01/09/22 0046   Place/Maintain arterial line, , , Until Discontinued **AND** 0.9 %  sodium chloride infusion, , Intra-arterial, PRN, Nipp, Carriel T, MD   acetaminophen (TYLENOL) tablet 650 mg, 650 mg, Oral, Q4H PRN,  Leonie Man, MD, 650 mg at 01/16/22 2333   albuterol (PROVENTIL) (2.5 MG/3ML) 0.083% nebulizer solution 2.5 mg, 2.5 mg, Nebulization, Q2H PRN, Irish Lack, Jayadeep S, MD   amiodarone (PACERONE) tablet 200 mg, 200 mg, Oral, BID, Joette Catching, PA-C, 200 mg at 01/17/22 2947   aspirin chewable tablet 81 mg, 81 mg, Oral, Daily, Leonie Man, MD, 81 mg at 01/17/22 0817   atorvastatin (LIPITOR) tablet 80 mg, 80 mg, Oral, Daily, Larey Dresser, MD, 80 mg at 01/17/22 6546   Chlorhexidine Gluconate Cloth 2 % PADS 6 each, 6 each, Topical, Daily, Leonie Man, MD, 6 each at 01/17/22 0800   diazepam (VALIUM) tablet 10-20 mg, 10-20 mg, Oral, Q8H PRN, Jaci Lazier, MD, 20 mg at 01/17/22 0830   docusate sodium (COLACE) capsule 100 mg, 100 mg, Oral, BID, Larey Dresser, MD, 100 mg at 01/17/22 0817   famotidine (PEPCID) tablet 10 mg, 10 mg, Oral, Daily, Larey Dresser, MD, 10 mg at 01/17/22 0816   fluticasone furoate-vilanterol (BREO ELLIPTA) 200-25 MCG/ACT 1 puff, 1 puff, Inhalation, Daily, Dahlia Byes, MD, 1 puff at 01/17/22 0811   guaiFENesin (MUCINEX) 12 hr tablet 600 mg, 600 mg, Oral, BID, Dahlia Byes, MD, 600 mg at 01/17/22 0818   heparin ADULT infusion 100 units/mL (25000 units/238m), 1,400 Units/hr, Intravenous, Continuous, Carney, JGay Filler RPH, Last Rate: 14 mL/hr at 01/17/22 1320, 1,400 Units/hr at 01/17/22 1320   hydrocortisone cream 1 %, , Topical, PRN, MLarey Dresser MD   insulin aspart (novoLOG) injection 0-15 Units, 0-15 Units, Subcutaneous, TID WC, VDahlia Byes MD, 3 Units at 01/14/22 1702   insulin aspart (novoLOG) injection 0-5 Units, 0-5 Units, Subcutaneous, QHS, VDahlia Byes MD   midodrine (PROAMATINE) tablet 5 mg, 5 mg, Oral, TID WC, FJoette Catching PA-C, 5 mg at 01/17/22 1320   montelukast (SINGULAIR) tablet 10 mg, 10 mg, Oral, QHS, HLeonie Man MD, 10 mg at 01/16/22 2145   multivitamin with minerals tablet 1 tablet, 1 tablet, Oral,  Daily, MLarey Dresser MD, 1 tablet at 01/17/22 0817   nicotine (NICODERM CQ - dosed in mg/24 hours) patch 14 mg, 14 mg, Transdermal, Daily, VDahlia Byes MD, 14 mg at 01/17/22 0818   ondansetron (ZOFRAN) injection 4 mg, 4 mg, Intravenous, Q6H PRN, HLeonie Man MD, 4 mg at 01/14/22 1309   oxyCODONE (Oxy IR/ROXICODONE) immediate release tablet 5 mg, 5 mg, Oral, Q6H PRN, SCandee Furbish MD, 5 mg at 01/15/22 0154   pneumococcal 20-valent conjugate vaccine (PREVNAR 20) injection 0.5 mL, 0.5 mL, Intramuscular, Prior to discharge, MLarey Dresser MD   polyethylene glycol (MIRALAX / GLYCOLAX) packet 17 g, 17 g, Oral, Daily, MLarey Dresser MD, 17 g at 01/17/22 [id number]  sodium chloride (OCEAN) 0.65 % nasal spray 1 spray, 1 spray, Each Nare, PRN, Bensimhon, Shaune Pascal, MD   sodium chloride flush (NS) 0.9 % injection 10-40 mL, 10-40 mL, Intracatheter, Q12H, Leonie Man, MD, 10 mL at 01/17/22 0900   sodium chloride flush (NS) 0.9 % injection 3 mL, 3 mL, Intravenous, Q12H, Leonie Man, MD, 3 mL at 01/17/22 0900   sodium phosphate (FLEET) 7-19 GM/118ML enema 1 enema, 1 enema, Rectal, Once, Larey Dresser, MD   thiamine tablet 100 mg, 100 mg, Oral, Daily, Dahlia Byes, MD, 100 mg at 01/17/22 0816   traMADol (ULTRAM) tablet 50 mg, 50 mg, Oral, Q12H PRN, Jaci Lazier, MD, 50 mg at 01/14/22 0555   zolpidem (AMBIEN) tablet 5 mg, 5 mg, Oral, QHS PRN, Leonie Man, MD, 5 mg at 01/14/22 2145  Patients Current Diet:  Diet Order             Diet regular Room service appropriate? Yes; Fluid consistency: Thin; Fluid restriction: 1500 mL Fluid  Diet effective now                   Precautions / Restrictions Precautions Precautions: Fall, Other (comment) Precaution Comments: Watch SpO2 (does not wear baseline) Restrictions Weight Bearing Restrictions: No   Has the patient had 2 or more falls or a fall with injury in the past year? No  Prior Activity Level Community  (5-7x/wk): Went out daily, was driving.  Prior Functional Level Self Care: Did the patient need help bathing, dressing, using the toilet or eating? Independent  Indoor Mobility: Did the patient need assistance with walking from room to room (with or without device)? Independent  Stairs: Did the patient need assistance with internal or external stairs (with or without device)? Independent  Functional Cognition: Did the patient need help planning regular tasks such as shopping or remembering to take medications? Independent  Patient Information Are you of Hispanic, Latino/a,or Spanish origin?: A. No, not of Hispanic, Latino/a, or Spanish origin What is your race?: A. White Do you need or want an interpreter to communicate with a doctor or health care staff?: 0. No  Patient's Response To:  Health Literacy and Transportation Is the patient able to respond to health literacy and transportation needs?: Yes Health Literacy - How often do you need to have someone help you when you read instructions, pamphlets, or other written material from your doctor or pharmacy?: Never In the past 12 months, has lack of transportation kept you from medical appointments or from getting medications?: No In the past 12 months, has lack of transportation kept you from meetings, work, or from getting things needed for daily living?: No  Development worker, international aid / [place] Devices/Equipment: None Home Equipment: Cane - single point  Prior Device Use: Indicate devices/aids used by the patient prior to current illness, exacerbation or injury?  Cane  Current Functional Level Cognition  Overall Cognitive Status: No family/caregiver present to determine baseline cognitive functioning Current Attention Level: Selective Orientation Level: Oriented X4 Following Commands: Follows multi-step commands inconsistently Safety/Judgement: Decreased awareness of safety, Decreased awareness of deficits General  Comments: not as impulsive with OT this session    Extremity Assessment (includes Sensation/Coordination)  Upper Extremity Assessment: Generalized weakness  Lower Extremity Assessment: Defer to PT evaluation    ADLs  Overall ADL's : Needs assistance/impaired Eating/Feeding: Independent, Sitting Grooming: Set up, Sitting, Oral care, Wash/dry face, Wash/dry hands Upper Body Bathing: Set up, Sitting Lower Body Bathing:  Moderate assistance Lower Body Bathing Details (indicate cue type and reason): min A sit<>stand from bed Upper Body Dressing : Minimal assistance, Sitting Lower Body Dressing: Moderate assistance, Sit to/from stand Lower Body Dressing Details (indicate cue type and reason): min A sit<>stand from bed Toilet Transfer: Min guard, Rolling walker (2 wheels), Comfort height toilet Toilet Transfer Details (indicate cue type and reason): side step up towards [place] and Hygiene: Moderate assistance Toileting - Clothing Manipulation Details (indicate cue type and reason): min A sit<>stand from bed    Mobility  Overal bed mobility: Needs Assistance Bed Mobility: Sit to Supine Supine to sit: Mod assist, HOB elevated Sit to supine: Supervision General bed mobility comments: Received sitting in recliner    Transfers  Overall transfer level: Needs assistance Equipment used: Rolling walker (2 wheels) Transfers: Sit to/from Stand, Bed to chair/wheelchair/BSC Sit to Stand: Supervision Bed to/from chair/wheelchair/BSC transfer type:: Step pivot Step pivot transfers: Min guard General transfer comment: repeated sit<>stands from recliner without DME, min guard for balance; additional trial with rollator, educ on lock/unlocking brakes    Ambulation / Gait / Stairs / Wheelchair Mobility  Ambulation/Gait Ambulation/Gait assistance: Min guard, Min assist, Mod assist Gait Distance (Feet): 110 Feet Assistive device: Rollator (4 wheels) Gait  Pattern/deviations: Step-through pattern, Decreased stride length, Drifts right/left, Staggering left, Trunk flexed General Gait Details: hurried, unsteady gait with rollator and intermittent min-modA to manage rollator and prevent LOB; pt with apparent poor safety awareness and not following directions despite multiple cues to slow down; pt running into trashcan when back in room requiring max verbal cues for safety/sequencing Gait velocity: Decreased Gait velocity interpretation: <1.31 ft/sec, indicative of household ambulator    Posture / Balance Balance Overall balance assessment: Needs assistance Sitting-balance support: No upper extremity supported, Feet supported Sitting balance-Leahy Scale: Good Standing balance support: Reliant on assistive device for balance Standing balance-Leahy Scale: Fair Standing balance comment: can static stand without UE support; static and dynamic stability improved with UE support    Special needs/care consideration Continuous Drip IV  Heparin drip, Skin ***, and Special service needs ***   Previous Home Environment (from acute therapy documentation) Living Arrangements: Alone Available Help at Discharge: Family, Available PRN/intermittently Type of Home: House Home Layout: Multi-level, Laundry or work area in basement, Bed/bath upstairs Alternate Level Stairs-Rails: Left Alternate Level Stairs-Number of Steps: 7 Home Access: Stairs to enter Entrance Stairs-Rails: Right, Left, Can reach both Entrance Stairs-Number of Steps: 4 Bathroom Shower/Tub: Multimedia programmer: Handicapped height Home Care Services: No Additional Comments: Daughter did state that pt can come to her home intiially and she has no steps.  Discharge Living Setting Plans for Discharge Living Setting: Patient's home, House, Alone (Lives alone, but could go to dtrs house at discharge.) Type of Home at Discharge: House Discharge Home Layout: Multi-level Alternate Level  Stairs-Number of Steps: 7 Discharge Home Access: Stairs to enter Entrance Stairs-Number of Steps: 4-5 steps up to kitchen area Discharge Bathroom Shower/Tub: Walk-in shower, Door Discharge Bathroom Toilet: Handicapped height Discharge Bathroom Accessibility: Yes How Accessible: Accessible via walker Does the patient have any problems obtaining your medications?: Yes (Describe) (fixed income, has difficulty affording medications)  Social/Family/Support Systems Patient Roles: Parent, Other (Comment) (Has daughter, son, niece.) Contact Information: Alexandr Oehler - daughter - [phone] Anticipated Caregiver: Daughter Ability/Limitations of Caregiver: Dtr works from home.  Can go home with dtr who has 1 level with 3 step entry. Caregiver Availability: Intermittent Discharge Plan Discussed with Primary Caregiver: Yes  Is Caregiver In Agreement with Plan?: Yes Does Caregiver/Family have Issues with Lodging/Transportation while Pt is in Rehab?: No  Goals Patient/Family Goal for Rehab: PT/OTmod I to supervision goals Expected length of stay: 10-12 days Pt/Family Agrees to Admission and willing to participate: Yes Program Orientation Provided & Reviewed with Pt/Caregiver Including Roles  & Responsibilities: Yes  Decrease burden of Care through IP rehab admission: N/A  Possible need for SNF placement upon discharge: Not planned  Patient Condition: I have reviewed medical records from [facility], spoken with CM, and patient and daughter. I met with patient at the bedside and discussed via phone for inpatient rehabilitation assessment.  Patient will benefit from ongoing PT and OT, can actively participate in 3 hours of therapy a day 5 days of the week, and can make measurable gains during the admission.  Patient will also benefit from the coordinated team approach during an Inpatient Acute Rehabilitation admission.  The patient will receive intensive therapy as well as Rehabilitation physician,  nursing, social worker, and care management interventions.  Due to bladder management, bowel management, safety, skin/wound care, disease management, medication administration, pain management, and patient education the patient requires 24 hour a day rehabilitation nursing.  The patient is currently *** with mobility and basic ADLs.  Discharge setting and therapy post discharge at home with home health is anticipated.  Patient has agreed to participate in the Acute Inpatient Rehabilitation Program and will admit {Time; today/tomorrow:10263}.  Preadmission Screen Completed By:  Retta Diones, 01/17/2022 4:06 PM ______________________________________________________________________   Discussed status with Dr. Marland Kitchen on *** at *** and received approval for admission today.  Admission Coordinator:  Retta Diones, RN, time Marland KitchenSudie Grumbling ***   Assessment/Plan: Diagnosis: Does the need for close, 24 hr/day Medical supervision in concert with the patient's rehab needs make it unreasonable for this patient to be served in a less intensive setting? {yes_no_potentially:3041433} Co-Morbidities requiring supervision/potential complications: *** Due to {due UV:2224114}, does the patient require 24 hr/day rehab nursing? {yes_no_potentially:3041433} Does the patient require coordinated care of a physician, rehab nurse, PT, OT, and SLP to address physical and functional deficits in the context of the above medical diagnosis(es)? {yes_no_potentially:3041433} Addressing deficits in the following areas: {deficits:3041436} Can the patient actively participate in an intensive therapy program of at least 3 hrs of therapy 5 days a week? {yes_no_potentially:3041433} The potential for patient to make measurable gains while on inpatient rehab is {potential:3041437} Anticipated functional outcomes upon discharge from inpatient rehab: {functional outcomes:304600100} PT, {functional outcomes:304600100} OT, {functional  outcomes:304600100} SLP Estimated rehab length of stay to reach the above functional goals is: *** Anticipated discharge destination: {anticipated dc setting:21604} 10. Overall Rehab/Functional Prognosis: {potential:3041437}   MD Signature: ***

## 2022-01-17 NOTE — Progress Notes (Signed)
ANTICOAGULATION CONSULT NOTE - Follow Up Consult  Pharmacy Consult for heparin Indication: chest pain/ACS and atrial fibrillation  Allergies  Allergen Reactions   Codeine Other (See Comments)    Unknown   Meprobamate Swelling   Sulfa Antibiotics Other (See Comments)    Unknown    Patient Measurements: Height: '5\' 6"'$  (167.6 cm) Weight: 120 kg (264 lb 8.8 oz) (reweighed multiple times?) IBW/kg (Calculated) : 59.3 Heparin Dosing Weight: 85.7 kg  Vital Signs: Temp: 98.5 F (36.9 C) (06/01 0805) Temp Source: Oral (06/01 0400) BP: 134/38 (06/01 1000) Pulse Rate: 67 (06/01 1000)  Labs: Recent Labs    01/15/22 0430 01/15/22 1455 01/16/22 0358 01/16/22 1636 01/17/22 0446  HGB 9.9*  --  9.5*  --  10.7*  HCT 29.2*  --  27.9*  --  32.5*  PLT 226  --  216  --  252  HEPARINUNFRC 0.54  --  0.48  --  0.23*  CREATININE 2.18*   < > 1.44* 1.92* 2.29*   < > = values in this interval not displayed.     Estimated Creatinine Clearance: 24.6 mL/min (A) (by C-G formula based on SCr of 2.29 mg/dL (H)).  Assessment: 81 y.o. female admitted with Afib and STEMI s/p cath and awaiting CABG versus high-risk PCI. Patient is not on any anticoagulants PTA. Pharmacy has been consulted for heparin.  Heparin level slightly below goal 0.23; no known issues with IV infusion.  No overt bleeding or complications noted.  Hgb WNL.  Goal of Therapy:  Heparin level 0.3-0.7 units/ml Monitor platelets by anticoagulation protocol: Yes   Plan:  Increase IV heparin a little to 1400 units/hr to keep in therapeutic range. Daily heparin level and CBC F/u plans for oral anticoagulation eventually once renal plans more clear.  [place] Crane, Roylene Reason, BCCP Clinical Pharmacist  01/17/2022 12:09 PM   [facility] pharmacy phone numbers are listed on [email or web address]

## 2022-01-17 NOTE — Progress Notes (Signed)
IP rehab admissions - I met with patient at the bedside and I spoke with her daughter, Colletta [place], by telephone.  Dtr would like inpatient rehab prior to home with dtr or home alone if patient progresses well.  Not ready yet for CIR.  Will follow for progress and medical readiness.  Call for questions.  [phone]

## 2022-01-17 NOTE — Progress Notes (Signed)
Seen on afternoon rounds.  Reports itchy rash on trunk and extremities. ? Allergic dermatitis.  Reviewed meds with pharmD. Considered delayed reaction to cephalosporin or dye in po amiodarone.  Already getting Pepcid and hydrocortisone cream.  Will give dose of benadryl.   D/t complexity of medical issues including AKI and CAD/CHF, patient and family are interested in palliative care consult to discuss goals of care. Order placed.

## 2022-01-17 NOTE — Progress Notes (Signed)
Patient ID: Brittany Jackson, female   DOB: 1940-11-11, 81 y.o.   MRN: [id number]     Advanced Heart Failure Rounding Note  PCP-Cardiologist: None   Subjective:    CVVH stopped yesterday, creatinine 2.29 today.  She made 720 cc urine.  Weight seems inaccurate.  Na stable 130. CVP 9-10 on my read this morning, co-ox 71%.   BP stable, on midodrine still with wide pulse pressure.   She walked around the unit today.   Repeat echo 05/29: EF improved to 65-70%, RV okay   Objective:   Weight Range: 120 kg Body mass index is 42.7 kg/m.   Vital Signs:   Temp:  [98.2 F (36.8 C)-98.6 F (37 C)] 98.4 F (36.9 C) (06/01 0400) Pulse Rate:  [60-81] 66 (06/01 0600) Resp:  [13-20] 20 (06/01 0600) BP: (96-145)/(32-119) 130/33 (06/01 0600) SpO2:  [88 %-98 %] 91 % (06/01 0600) FiO2 (%):  [28 %] 28 % (05/31 0834) Weight:  [120 kg] 120 kg (06/01 0424) Last BM Date : 01/14/22  Weight change: Filed Weights   01/15/22 0500 01/16/22 0615 01/17/22 0424  Weight: 112.4 kg 114.9 kg 120 kg    Intake/Output:   Intake/Output Summary (Last 24 hours) at 01/17/2022 0717 Last data filed at 01/17/2022 0700 Gross per 24 hour  Intake 2190.82 ml  Output 1245 ml  Net 945.82 ml      Physical Exam  CVP 9-10 General: NAD Neck: JVP 8-9 cm, no thyromegaly or thyroid nodule.  Lungs: Clear to auscultation bilaterally with normal respiratory effort. CV: Nondisplaced PMI.  Heart regular S1/S2, no S3/S4, no murmur.  No peripheral edema.   Abdomen: Soft, nontender, no hepatosplenomegaly, no distention.  Skin: Intact without lesions or rashes.  Neurologic: Alert and oriented x 3.  Psych: Normal affect. Extremities: No clubbing or cyanosis.  HEENT: Normal.   Telemetry   SR 60s-70s  Labs    CBC Recent Labs    01/16/22 0358 01/17/22 0446  WBC 11.5* 14.4*  HGB 9.5* 10.7*  HCT 27.9* 32.5*  MCV 90.6 91.5  PLT 216 939   Basic Metabolic Panel Recent Labs    01/15/22 1455 01/16/22 0358  01/16/22 1636 01/17/22 0446  NA 129* 131* 133* 130*  K 3.6 3.6 3.9 3.8  CL 96* 100 100 99  CO2 '25 23 27 24  '$ GLUCOSE 165* 115* 117* 97  BUN 26* 22 24* 27*  CREATININE 1.87* 1.44* 1.92* 2.29*  CALCIUM 7.9* 7.6* 8.1* 8.1*  MG  --  2.5*  --  2.3  PHOS 3.3  --  2.8  --    Liver Function Tests Recent Labs    01/16/22 0358 01/16/22 1636 01/17/22 0446  AST 20  --  20  ALT 14  --  15  ALKPHOS 70  --  65  BILITOT 0.3  --  0.3  PROT 5.0*  --  5.3*  ALBUMIN 2.4* 2.4* 2.4*   No results for input(s): LIPASE, AMYLASE in the last 72 hours. Cardiac Enzymes No results for input(s): CKTOTAL, CKMB, CKMBINDEX, TROPONINI in the last 72 hours.  BNP: BNP (last 3 results) Recent Labs    01/08/22 2356  BNP 227.4*    ProBNP (last 3 results) No results for input(s): PROBNP in the last 8760 hours.   D-Dimer No results for input(s): DDIMER in the last 72 hours. Hemoglobin A1C No results for input(s): HGBA1C in the last 72 hours.  Fasting Lipid Panel No results for input(s): CHOL, HDL, LDLCALC, TRIG, CHOLHDL,  LDLDIRECT in the last 72 hours.  Thyroid Function Tests No results for input(s): TSH, T4TOTAL, T3FREE, THYROIDAB in the last 72 hours.  Invalid input(s): FREET3   Other results:   Imaging    No results found.   Medications:     Scheduled Medications:  amiodarone  200 mg Oral BID   aspirin  81 mg Oral Daily   atorvastatin  80 mg Oral Daily   Chlorhexidine Gluconate Cloth  6 each Topical Daily   docusate sodium  100 mg Oral BID   famotidine  10 mg Oral Daily   feeding supplement  237 mL Oral TID WC   fluticasone furoate-vilanterol  1 puff Inhalation Daily   guaiFENesin  600 mg Oral BID   insulin aspart  0-15 Units Subcutaneous TID WC   insulin aspart  0-5 Units Subcutaneous QHS   midodrine  5 mg Oral TID WC   montelukast  10 mg Oral QHS   multivitamin with minerals  1 tablet Oral Daily   nicotine  14 mg Transdermal Daily   nitroGLYCERIN  0.1 mg Transdermal  Daily   polyethylene glycol  17 g Oral Daily   sodium chloride flush  10-40 mL Intracatheter Q12H   sodium chloride flush  3 mL Intravenous Q12H   sodium phosphate  1 enema Rectal Once   thiamine  100 mg Oral Daily    Infusions:  sodium chloride Stopped (01/09/22 0046)   sodium chloride     heparin 1,350 Units/hr (01/17/22 0700)    PRN Medications: Place/Maintain arterial line **AND** sodium chloride, acetaminophen, albuterol, diazepam, hydrocortisone cream, ondansetron (ZOFRAN) IV, oxyCODONE, pneumococcal 20-valent conjugate vaccine, sodium chloride, traMADol, zolpidem   Assessment/Plan   1. CAD: Patient presented with ACS. Peak HS-TnI 3557. LHC with 3VD, 50-60% dLM, ?90% ostial LAD, 75% stenosis at bifurcation of mLAD and D, 75% stenosis at bifurcation of mLCx and OM.  The ostial LAD is not completely laid out so not sure on review that it is truly critical.  CABG would be a consideration if LAD disease is severe, may require FFR to determine this.  She, however, may be too high risk with severe ischemic cardiomyopathy, COPD, and renal dysfunction. PCI distal left main into LAD also an option if FFR shows severe disease.  For now, medical management given current clinical issues and recovery of LV systolic function.  No chest pain.  - ASA 81 - Stop NTG patch for now.  - Continue heparin gtt - atorvastatin 80 daily - When she has stabilized medically, would favor repeat cath with FFR LM into LAD to assess significance of distal LM/ostial LAD disease prior to CABG vs PCI. This may be a while in the future.  2. Acute systolic CHF: Patient has CAD, but not sure it explains the extent of her cardiomyopathy.  ?Stress (Takotsubo-type) CMP on top of pre-existing CAD.  Low output on initial RHC with CI 1.8.  Filling pressures significantly elevated on RHC as well.  Echo initially with EF 20-25%, mildly decreased RV systolic function, trivial MR, dilated IVC. Repeat echo 05/29 with improvement in  EF to 65-70% => suspect Takotsubo CMP. Co-ox 71%, CVP 9-10 this morning.  Now off CVVH, creatinine 2.29.  - Will give 1 dose Lasix 60 mg IV this morning.  - Continue midodrine 5 TID. Has wide pulse pressure, SBP okay but diastolic pressure [id number] 3.  Atrial fibrillation: Paroxysmal, noted initially this hospitalization.  Now in NSR. - Continue heparin gtt.  - Continue amiodarone 200  mg BID 4.  AKI: Creatinine up to 3.67. Suspect cardiorenal/ATN with low output + CIN.  With persistent hyponatremia not responding well to 3% saline and volume not significantly elevated to suggest benefit from tolvaptan, she was started CVVH on 05/29.  This was stopped on 5/31.  She would not want long-term HD.  She had 720 cc UOP yesterday off CVVH, creatinine 2.29.  - 1 dose Lasix today as above.  - Follow renal function off CVVH.  5. ID: CXR with R>L lower lobe infiltrates.  Afebrile.  Sputum culture with GPCs -> normal flora.  - Covered for HAP, stop cefepime today.  6. COPD: Patient was a smoker up until admission.  Bedside PFTs 5/23 FEV 1 1.09L (53%) FVC 1.68 L (61%) FEF 25-75 0.54 (39%) - aggressive pulmonary toilet 7. Hyponatremia:  Intractable to the point of requiring CVVH.  Now 130. - Maintain fluid restriction.   8. Deconditioning - ambulate - Needs aggressive PT/OT  Length of Stay: [place], MD  01/17/2022, 7:17 AM  Advanced Heart Failure Team Pager [phone] (M-F; 7a - 5p)  Please contact [facility] for night-coverage after hours (5p -7a ) and weekends on [email or web address]

## 2022-01-17 NOTE — Progress Notes (Signed)
After patient woke from her nap, I was prepared to take her out on the patio to get some fresh air; however, she no longer wanted to go outside or walk around the unit because she was upset that her daughter left while she was sleeping.

## 2022-01-17 NOTE — Plan of Care (Signed)

## 2022-01-18 ENCOUNTER — Encounter (HOSPITAL_COMMUNITY): Payer: Self-pay | Admitting: Cardiology

## 2022-01-18 ENCOUNTER — Inpatient Hospital Stay (HOSPITAL_COMMUNITY): Payer: Medicare Other

## 2022-01-18 DIAGNOSIS — N179 Acute kidney failure, unspecified: Secondary | ICD-10-CM | POA: Diagnosis not present

## 2022-01-18 DIAGNOSIS — I2102 ST elevation (STEMI) myocardial infarction involving left anterior descending coronary artery: Secondary | ICD-10-CM | POA: Diagnosis not present

## 2022-01-18 DIAGNOSIS — Z7189 Other specified counseling: Secondary | ICD-10-CM

## 2022-01-18 DIAGNOSIS — Z515 Encounter for palliative care: Secondary | ICD-10-CM

## 2022-01-18 DIAGNOSIS — I2109 ST elevation (STEMI) myocardial infarction involving other coronary artery of anterior wall: Secondary | ICD-10-CM | POA: Diagnosis not present

## 2022-01-18 DIAGNOSIS — I5041 Acute combined systolic (congestive) and diastolic (congestive) heart failure: Secondary | ICD-10-CM | POA: Diagnosis not present

## 2022-01-18 LAB — CBC
HCT: 32.7 % — ABNORMAL LOW (ref 36.0–46.0)
HCT: 33.6 % — ABNORMAL LOW (ref 36.0–46.0)
Hemoglobin: 10.8 g/dL — ABNORMAL LOW (ref 12.0–15.0)
Hemoglobin: 11.6 g/dL — ABNORMAL LOW (ref 12.0–15.0)
MCH: 30 pg (ref 26.0–34.0)
MCH: 30.7 pg (ref 26.0–34.0)
MCHC: 33 g/dL (ref 30.0–36.0)
MCHC: 34.5 g/dL (ref 30.0–36.0)
MCV: 90.8 fL (ref 80.0–100.0)
MCV: 88.9 fL (ref 80.0–100.0)
Platelets: 295 10*3/uL (ref 150–400)
Platelets: 281 10*3/uL (ref 150–400)
RBC: 3.6 MIL/uL — ABNORMAL LOW (ref 3.87–5.11)
RBC: 3.78 MIL/uL — ABNORMAL LOW (ref 3.87–5.11)
RDW: 14.4 % (ref 11.5–15.5)
RDW: 14.3 % (ref 11.5–15.5)
WBC: 27.2 10*3/uL — ABNORMAL HIGH (ref 4.0–10.5)
WBC: 28.1 10*3/uL — ABNORMAL HIGH (ref 4.0–10.5)
nRBC: 0.1 % (ref 0.0–0.2)
nRBC: 0 % (ref 0.0–0.2)

## 2022-01-18 LAB — COMPREHENSIVE METABOLIC PANEL
ALT: 14 U/L (ref 0–44)
AST: 17 U/L (ref 15–41)
Albumin: 2.2 g/dL — ABNORMAL LOW (ref 3.5–5.0)
Alkaline Phosphatase: 62 U/L (ref 38–126)
Anion gap: 6 (ref 5–15)
BUN: 34 mg/dL — ABNORMAL HIGH (ref 8–23)
CO2: 26 mmol/L (ref 22–32)
Calcium: 8.2 mg/dL — ABNORMAL LOW (ref 8.9–10.3)
Chloride: 98 mmol/L (ref 98–111)
Creatinine, Ser: 2.56 mg/dL — ABNORMAL HIGH (ref 0.44–1.00)
GFR, Estimated: 18 mL/min — ABNORMAL LOW (ref >60)
Glucose, Bld: 78 mg/dL (ref 70–99)
Potassium: 4.4 mmol/L (ref 3.5–5.1)
Sodium: 130 mmol/L — ABNORMAL LOW (ref 135–145)
Total Bilirubin: 0.4 mg/dL (ref 0.3–1.2)
Total Protein: 5 g/dL — ABNORMAL LOW (ref 6.5–8.1)

## 2022-01-18 LAB — RENAL FUNCTION PANEL
Albumin: 2.1 g/dL — ABNORMAL LOW (ref 3.5–5.0)
Anion gap: 9 (ref 5–15)
BUN: 42 mg/dL — ABNORMAL HIGH (ref 8–23)
CO2: 23 mmol/L (ref 22–32)
Calcium: 8 mg/dL — ABNORMAL LOW (ref 8.9–10.3)
Chloride: 95 mmol/L — ABNORMAL LOW (ref 98–111)
Creatinine, Ser: 2.63 mg/dL — ABNORMAL HIGH (ref 0.44–1.00)
GFR, Estimated: 18 mL/min — ABNORMAL LOW (ref >60)
Glucose, Bld: 101 mg/dL — ABNORMAL HIGH (ref 70–99)
Phosphorus: 2.9 mg/dL (ref 2.5–4.6)
Potassium: 4.5 mmol/L (ref 3.5–5.1)
Sodium: 127 mmol/L — ABNORMAL LOW (ref 135–145)

## 2022-01-18 LAB — DIFFERENTIAL
Abs Immature Granulocytes: 0.43 10*3/uL — ABNORMAL HIGH (ref 0.00–0.07)
Basophils Absolute: 0.1 10*3/uL (ref 0.0–0.1)
Basophils Relative: 0 %
Eosinophils Absolute: 1.5 10*3/uL — ABNORMAL HIGH (ref 0.0–0.5)
Eosinophils Relative: 5 %
Immature Granulocytes: 2 %
Lymphocytes Relative: 9 %
Lymphs Abs: 2.4 10*3/uL (ref 0.7–4.0)
Monocytes Absolute: 1.4 10*3/uL — ABNORMAL HIGH (ref 0.1–1.0)
Monocytes Relative: 5 %
Neutro Abs: 22.2 10*3/uL — ABNORMAL HIGH (ref 1.7–7.7)
Neutrophils Relative %: 79 %

## 2022-01-18 LAB — PHOSPHORUS: Phosphorus: 3 mg/dL (ref 2.5–4.6)

## 2022-01-18 LAB — COOXEMETRY PANEL
Carboxyhemoglobin: 2.4 % — ABNORMAL HIGH (ref 0.5–1.5)
Carboxyhemoglobin: 2.2 % — ABNORMAL HIGH (ref 0.5–1.5)
Methemoglobin: 1 % (ref 0.0–1.5)
Methemoglobin: 0.7 % (ref 0.0–1.5)
O2 Saturation: 86.5 %
O2 Saturation: 77.7 %
Total hemoglobin: 10 g/dL — ABNORMAL LOW (ref 12.0–16.0)
Total hemoglobin: 10.9 g/dL — ABNORMAL LOW (ref 12.0–16.0)

## 2022-01-18 LAB — GLUCOSE, CAPILLARY
Glucose-Capillary: 82 mg/dL (ref 70–99)
Glucose-Capillary: 94 mg/dL (ref 70–99)
Glucose-Capillary: 111 mg/dL — ABNORMAL HIGH (ref 70–99)
Glucose-Capillary: 130 mg/dL — ABNORMAL HIGH (ref 70–99)

## 2022-01-18 LAB — SEDIMENTATION RATE: Sed Rate: 32 mm/hr — ABNORMAL HIGH (ref 0–22)

## 2022-01-18 LAB — PROCALCITONIN: Procalcitonin: 0.27 ng/mL

## 2022-01-18 LAB — HEPARIN LEVEL (UNFRACTIONATED): Heparin Unfractionated: 0.37 IU/mL (ref 0.30–0.70)

## 2022-01-18 LAB — MAGNESIUM: Magnesium: 2.3 mg/dL (ref 1.7–2.4)

## 2022-01-18 MED ORDER — HYDROXYZINE HCL 25 MG PO TABS
25.0000 mg | ORAL_TABLET | Freq: Three times a day (TID) | ORAL | Status: DC | PRN
Start: 2022-01-18 — End: 2022-01-22
  Administered 2022-01-18 – 2022-01-19 (×3): 25 mg via ORAL
  Filled 2022-01-18 (×3): qty 1

## 2022-01-18 MED ORDER — AMIODARONE HCL IN DEXTROSE 360-4.14 MG/200ML-% IV SOLN
30.0000 mg/h | INTRAVENOUS | Status: DC
  Administered 2022-01-18: 30 mg/h via INTRAVENOUS
  Administered 2022-01-18: 60 mg/h via INTRAVENOUS
  Administered 2022-01-19 – 2022-01-20 (×2): 30 mg/h via INTRAVENOUS
  Filled 2022-01-18 (×5): qty 200

## 2022-01-18 MED ORDER — PREDNISONE 20 MG PO TABS
60.0000 mg | ORAL_TABLET | Freq: Every day | ORAL | Status: AC
  Administered 2022-01-18 – 2022-01-20 (×3): 60 mg via ORAL
  Filled 2022-01-18 (×3): qty 3

## 2022-01-18 MED ORDER — LORATADINE 10 MG PO TABS
10.0000 mg | ORAL_TABLET | Freq: Every day | ORAL | Status: DC
  Administered 2022-01-18 – 2022-01-22 (×5): 10 mg via ORAL
  Filled 2022-01-18 (×5): qty 1

## 2022-01-18 MED ORDER — DIPHENHYDRAMINE HCL 25 MG PO CAPS
25.0000 mg | ORAL_CAPSULE | Freq: Once | ORAL | Status: AC
Start: 2022-01-18 — End: 2022-01-18
  Administered 2022-01-18: 25 mg via ORAL
  Filled 2022-01-18: qty 1

## 2022-01-18 NOTE — Progress Notes (Signed)
[facility] Progress Note    Assessment/ Plan:   AKI on CKD3a (bl Cr ~1.1) -AKI likely multifactorial: Secondary to acute CHF exacerbation and CIN.  Likely in ATN at this point. -Was nonoliguric however no robust response with Lasix.  Started CRRT on 5/29 especially as her kidney function is a limiting factor for further management of her cardiac conditions. S/P RIJ temp line placement on 5/29.  -s/p CRRT from 5/29-5/31/23. Urine output is okay, lasix '60mg'$  IV x 1 dose 6/1. Maintain temp HD line for now. Cr 2.56 from 2.29 yesterday. Not exhibiting uremic symptoms, volume status acceptable. No indications for renal replacement therapy as of today -Understandably, patient does not want long-term dialysis and hopefully this will not be the case given that she has a relatively okay baseline kidney function.  If kidney function worsens to the point where she is going to require further dialysis, then would recommend palliative care consultation to address goals of care  Hyponatremia, severe, now improved/stable -Thought to be secondary to CHF exacerbation in conjunction with AKI/free water excretion deficit -No real response with loop diuretics, tolvaptan, 3%NS initially -Na stable at 130 today, monitor for now, fluid restrict 1.5L/day. Lasix x 1 dose 6/1  STEMI -multivessel disease -high risk PCI down the road  Acute sCHF -EF 20-25%-likely stress cardiomyopathy, EF now improved to 65-70%. On midodrine  HAP -s/p cefepime  COPD -per primary service  Diffuse Rash, Leukocytosis -per primary service. Drug related (cephalosporin vs dye from PO amio)?  Discussed w/ primary service.   Subjective:   No acute events overnight. Has been dealing with diffuse itchiness and rash, benadryl did not help as per patient. S/p lasix '60mg'$  IV x 1 dose, uop ~1L Denies n/v, loss of appetite, brain fog, dysgeusia.   Objective:   BP (!) 119/38   Pulse 67   Temp 97.9 F (36.6 C) (Oral)    Resp 17   Ht '5\' 6"'$  (1.676 m)   Wt 118.1 kg   SpO2 95%   BMI 42.02 kg/m   Intake/Output Summary (Last 24 hours) at 01/18/2022 0849 Last data filed at 01/18/2022 0500 Gross per 24 hour  Intake 353.99 ml  Output 1000 ml  Net -646.01 ml   Weight change: -1.9 kg  Physical Exam: Gen:nad, sitting up in bed CVS:RRR Resp: cta bl, no w/r/r/c, unlabored LKG:MWNU Ext: no sig edema Skin: diffuse rash Neuro: awake, alert,  moves all ext spontaneously Dialysis access: RIJ temp HD cath: c/d/i  Imaging: No results found.  Labs: BMET Recent Labs  Lab 01/14/22 1459 01/15/22 0430 01/15/22 1455 01/16/22 0358 01/16/22 1636 01/17/22 0446 01/18/22 0411  NA 121* 123* 129* 131* 133* 130* 130*  K 4.4 3.5 3.6 3.6 3.9 3.8 4.4  CL 90* 92* 96* 100 100 99 98  CO2 20* '22 25 23 27 24 26  '$ GLUCOSE 180* 169* 165* 115* 117* 97 78  BUN 49* 32* 26* 22 24* 27* 34*  CREATININE 3.38* 2.18* 1.87* 1.44* 1.92* 2.29* 2.56*  CALCIUM 7.8* 7.8* 7.9* 7.6* 8.1* 8.1* 8.2*  PHOS 5.4* 3.4 3.3  --  2.8  --  3.0   CBC Recent Labs  Lab 01/15/22 0430 01/16/22 0358 01/17/22 0446 01/18/22 0411  WBC 12.0* 11.5* 14.4* 27.2*  HGB 9.9* 9.5* 10.7* 10.8*  HCT 29.2* 27.9* 32.5* 32.7*  MCV 88.8 90.6 91.5 90.8  PLT 226 216 252 295    Medications:     (feeding supplement) PROSource Plus  30 mL Oral BID BM  aspirin  81 mg Oral Daily   atorvastatin  80 mg Oral Daily   Chlorhexidine Gluconate Cloth  6 each Topical Daily   docusate sodium  100 mg Oral BID   famotidine  10 mg Oral Daily   fluticasone furoate-vilanterol  1 puff Inhalation Daily   guaiFENesin  600 mg Oral BID   insulin aspart  0-15 Units Subcutaneous TID WC   insulin aspart  0-5 Units Subcutaneous QHS   midodrine  5 mg Oral TID WC   montelukast  10 mg Oral QHS   multivitamin with minerals  1 tablet Oral Daily   nicotine  14 mg Transdermal Daily   polyethylene glycol  17 g Oral Daily   sodium chloride flush  10-40 mL Intracatheter Q12H   sodium  chloride flush  3 mL Intravenous Q12H   sodium phosphate  1 enema Rectal Once   thiamine  100 mg Oral Daily    Gean Quint, MD [facility] 01/18/2022, 8:49 AM

## 2022-01-18 NOTE — TOC CM/SW Note (Signed)
HF TOC CM spoke to pt's dtr and she wanted to speak to Nephrologist. Left message for nephrologist to follow up with dtr, Colletta [place]. [place], Heart Failure TOC CM [phone]

## 2022-01-18 NOTE — Consult Note (Signed)
Consultation Note Date: 01/18/2022   Patient Name: Brittany Jackson  DOB: 06-20-1941  MRN: [id number]  Age / Sex: 81 y.o., female  PCP: Redmond School, MD Referring Physician: Larey Dresser, MD  Reason for Consultation: Establishing goals of care  HPI/Patient Profile: 81 y.o. female  with past medical history of CHF, HTN, urticaria, history of back surgery, appendectomy, tubal ligation, left humerus nailing 2015, admitted on 01/08/2022 with STEMI - right heart cath, acute MI revascularization on 5/24.  Acute kidney injury requiring CRRT 5/29 through 5/31.  Clinical Assessment and Goals of Care: I have reviewed medical records including EPIC notes, labs and imaging, received report from RN, assessed the patient.  Brittany Jackson is lying quietly in bed.  She appears acutely ill, obese, somewhat weak and frail.  She is alert with no focal deficits.  She can make her basic needs known.  Her daughter/healthcare agent, Brittany Jackson and her son, Brittany Jackson, are present at bedside, along with bedside nursing staff.  We then met at the bedside to discuss diagnosis prognosis, GOC, EOL wishes, disposition and options.  I introduced Palliative Medicine as specialized medical care for people living with serious illness. It focuses on providing relief from the symptoms and stress of a serious illness. The goal is to improve quality of life for both the patient and the family.  Brittany Jackson, at times, seems concerned about palliative consult, associating it with hospice care.  She was reassured that palliative medicine is not hospice care.  We discussed a brief life review of the patient.  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  Her husband is deceased.  She had 3 children, 1 died 19 years ago.  She lives independently and is independent with IADLs.  She is close with her family and active around her split-level  home.  Brittany Jackson is very hard of hearing.  We then focused on their current illness.  Overall, daughter/HCPOA Brittany Jackson, is able to accurately relay both [name]'s chronic illness burden and her acute concerns.  She seems knowledgeable about the treatment plan and is very open in discussing goals of care with her mother.  We talk about her acute rash, pharmacy recommendations and time for recovery.  Brittany Jackson states that she has detailed discussions with Brittany Jackson about hemodialysis.  She shares that Brittany Jackson would not want to be on long-term hemodialysis even if it means she dies without it.  Brittany Jackson does question whether Brittany Jackson could have another trial of short-term hemodialysis if needed.  The natural disease trajectory and expectations at EOL were discussed.  Advanced directives, concepts specific to code status, were considered and discussed.  CODE STATUS discussions were held with daughter and son in the hallway without Brittany Jackson's presents.  At this point, Brittany Jackson remains full scope/full code.  We talked about the concept of "treat the treatable, but allowing natural passing".  I encourage family to consider choices.  We talked about keeping Brittany Jackson at the center of  decision-making.  Family does set limits of no tracheostomy.  Family shares that if Brittany Jackson were to worsen, and things were to look different, they would choose DNR.  Palliative Care services outpatient were explained and offered.  We talked about the benefits of outpatient palliative services to continue goals of care discussion in a nonthreatening manner/location such as when [name] is able to return home.  Discussed the importance of continued conversation with family and the medical providers regarding overall plan of care and treatment options, ensuring decisions are within the context of the patient's values and GOCs.  Questions and concerns were addressed.  The family was encouraged to call  with questions or concerns.  PMT will continue to support holistically.  Conference with attending, bedside nursing staff, transition of care team related to patient condition, needs, goals of care, disposition.   HCPOA  HCPOA -daughter, Brittany Jackson.  HCPOA paperwork noted in ACP tab of epic    SUMMARY OF RECOMMENDATIONS   At this point continue to treat the treatable Full scope/full code Time for outcomes No long-term dialysis but open to another trial of dialysis if needed Agreeable to intensive cardiac rehab, as qualified   Code Status/Advance Care Planning: Full code -we talk about the concept of "treat the treatable, but allowing natural passing".  We talked about statistics related to successful CPR.  Symptom Management:  Per attending, no additional needs at this time.  Palliative Prophylaxis:  Frequent Pain Assessment and Oral Care  Additional Recommendations (Limitations, Scope, Preferences): Full Scope Treatment and No Tracheostomy  Psycho-social/Spiritual:  Desire for further Chaplaincy support:yes Additional Recommendations: Caregiving  Support/Resources  Prognosis:  Unable to determine, based on outcomes.  Guarded at this point.  Discharge Planning:  To be determined, based on outcomes and qualifications.  At this point Brittany Jackson reluctantly agrees to short-term rehab.  Ultimate goal is to return to her own home.       Primary Diagnoses: Present on Admission:  STEMI (ST elevation myocardial infarction) ([place])  Acute ST elevation myocardial infarction (STEMI) of anterolateral wall ([place])   I have reviewed the medical record, interviewed the patient and family, and examined the patient. The following aspects are pertinent.  Past Medical History:  Diagnosis Date   CHF (congestive heart failure) (HCC)    Hypertension    Urticaria    Social History   Socioeconomic History   Marital status: Widowed    Spouse name: Not on file   Number of  children: Not on file   Years of education: Not on file   Highest education level: Not on file  Occupational History   Not on file  Tobacco Use   Smoking status: Every Day    Packs/day: 0.50    Types: Cigarettes   Smokeless tobacco: Never  Vaping Use   Vaping Use: Never used  Substance and Sexual Activity   Alcohol use: Yes    Comment: occasional   Drug use: No   Sexual activity: Never    Birth control/protection: Surgical  Other Topics Concern   Not on file  Social History Narrative   Not on file   Social Determinants of Health   Financial Resource Strain: Not on file  Food Insecurity: Not on file  Transportation Needs: Not on file  Physical Activity: Not on file  Stress: Not on file  Social Connections: Not on file   Family History  Problem Relation Age of Onset   Hypertension Sister    Allergic rhinitis  Neg Hx    Asthma Neg Hx    Angioedema Neg Hx    Atopy Neg Hx    Eczema Neg Hx    Immunodeficiency Neg Hx    Urticaria Neg Hx    Scheduled Meds:  (feeding supplement) PROSource Plus  30 mL Oral BID BM   aspirin  81 mg Oral Daily   atorvastatin  80 mg Oral Daily   Chlorhexidine Gluconate Cloth  6 each Topical Daily   famotidine  10 mg Oral Daily   fluticasone furoate-vilanterol  1 puff Inhalation Daily   guaiFENesin  600 mg Oral BID   insulin aspart  0-15 Units Subcutaneous TID WC   insulin aspart  0-5 Units Subcutaneous QHS   midodrine  5 mg Oral TID WC   montelukast  10 mg Oral QHS   multivitamin with minerals  1 tablet Oral Daily   nicotine  14 mg Transdermal Daily   polyethylene glycol  17 g Oral Daily   sodium chloride flush  10-40 mL Intracatheter Q12H   sodium chloride flush  3 mL Intravenous Q12H   sodium phosphate  1 enema Rectal Once   thiamine  100 mg Oral Daily   Continuous Infusions:  sodium chloride Stopped (01/09/22 0046)   sodium chloride     amiodarone 60 mg/hr (01/18/22 0838)   heparin 1,400 Units/hr (01/18/22 0812)   PRN  Meds:.Place/Maintain arterial line **AND** sodium chloride, acetaminophen, albuterol, diazepam, hydrocortisone cream, hydrOXYzine, ondansetron (ZOFRAN) IV, oxyCODONE, pneumococcal 20-valent conjugate vaccine, sodium chloride, traMADol, zolpidem Medications Prior to Admission:  Prior to Admission medications   Medication Sig Start Date End Date Taking? Authorizing Provider  albuterol (PROVENTIL) 4 MG tablet Take 2 mg by mouth 3 (three) times daily. 02/15/20  Yes [provider]  allopurinol (ZYLOPRIM) 100 MG tablet Take 100 mg by mouth daily. 12/19/21  Yes [provider]  Ascorbic Acid (VITAMIN C) 1000 MG tablet Take 1,000 mg by mouth daily.   Yes [provider]  aspirin EC 81 MG tablet Take 81 mg by mouth daily.   Yes [provider]  b complex vitamins capsule Take 1 capsule by mouth daily.   Yes [provider]  cetirizine (ZYRTEC) 10 MG tablet Take 1-2 tablets 2 times per day maximum. Patient taking differently: Take 10 mg by mouth daily as needed for allergies. 04/04/20  Yes Kozlow, Donnamarie Poag, MD  diazepam (VALIUM) 10 MG tablet Take 10 mg by mouth 3 (three) times daily as needed for anxiety. 06/09/20  Yes [provider]  famotidine (PEPCID) 40 MG tablet Take 1 tablet (40 mg total) by mouth 2 (two) times daily. Patient taking differently: Take 40 mg by mouth 2 (two) times daily as needed for heartburn or indigestion. 07/18/20  Yes Kozlow, Donnamarie Poag, MD  methocarbamol (ROBAXIN) 500 MG tablet Take 500 mg by mouth every 8 (eight) hours as needed for muscle spasms.   Yes [provider]  Multiple Vitamins-Minerals (MULTI FOR HER 50+) TABS Take 1 tablet by mouth daily.   Yes [provider]  ramipril (ALTACE) 10 MG capsule Take 10 mg by mouth daily. 11/05/21  Yes [provider]  Red Yeast Rice 600 MG CAPS Take 600 mg by mouth daily.   Yes [provider]  zinc gluconate 50 MG tablet Take 50 mg by mouth daily.   Yes  [provider]  famotidine (PEPCID) 20 MG tablet Take 1 tablet (20 mg total) by mouth 2 (two) times daily. Patient  not taking: Reported on 01/09/2022 07/20/20   Jiles Prows, MD  montelukast (SINGULAIR) 10 MG tablet Take 1 tablet (10 mg total) by mouth at bedtime. Patient not taking: Reported on 01/09/2022 07/20/20   Jiles Prows, MD   Allergies  Allergen Reactions   Codeine Other (See Comments)    Unknown   Meprobamate Swelling   Sulfa Antibiotics Other (See Comments)    Unknown   Review of Systems  Unable to perform ROS: Age   Physical Exam Vitals and nursing note reviewed.  Constitutional:      General: She is not in acute distress.    Appearance: She is obese. She is ill-appearing.  HENT:     Head: Normocephalic and atraumatic.  Cardiovascular:     Rate and Rhythm: Normal rate.  Pulmonary:     Effort: Pulmonary effort is normal. No tachypnea.  Musculoskeletal:     Right lower leg: No edema.     Left lower leg: No edema.  Skin:    General: Skin is warm and dry.     Comments: New rash noted all over  Neurological:     General: No focal deficit present.     Mental Status: She is alert.  Psychiatric:        Mood and Affect: Mood normal.        Behavior: Behavior normal.     Comments: Cooperative, somewhat fearful    Vital Signs: BP 121/89   Pulse 63   Temp 98.3 F (36.8 C) (Oral)   Resp (!) 21   Ht _0  (1.676 m)   Wt 118.1 kg   SpO2 94%   BMI 42.02 kg/m  Pain Scale: 0-10 POSS *See Group Information*: 1-Acceptable,Awake and alert Pain Score: 0-No pain   SpO2: SpO2: 94 % O2 Device:SpO2: 94 % O2 Flow Rate: .O2 Flow Rate (L/min): 2 L/min  IO: Intake/output summary:  Intake/Output Summary (Last 24 hours) at 01/18/2022 1423 Last data filed at 01/18/2022 0500 Gross per 24 hour  Intake 180 ml  Output 675 ml  Net -495 ml    LBM: Last BM Date : 01/14/22 Baseline Weight: Weight: 112.6 kg Most recent weight: Weight: 118.1 kg     Palliative  Assessment/Data:   Flowsheet Rows    Flowsheet Row Most Recent Value  Intake Tab   Referral Department Hospitalist  Unit at Time of Referral ICU  Palliative Care Primary Diagnosis Cardiac  Date Notified 01/17/22  Palliative Care Type New Palliative care  Reason for referral Clarify Goals of Care  Date of Admission 01/08/22  Date first seen by Palliative Care 01/18/22  # of days Palliative referral response time 1 Day(s)  # of days IP prior to Palliative referral 9  Clinical Assessment   Palliative Performance Scale Score 40%  Pain Max last 24 hours Not able to report  Pain Min Last 24 hours Not able to report  Dyspnea Max Last 24 Hours Not able to report  Dyspnea Min Last 24 hours Not able to report  Psychosocial & Spiritual Assessment   Palliative Care Outcomes        Time In:   1330 Time Out: 1445 Time Total: 75 minutes  Greater than 50%  of this time was spent counseling and coordinating care related to the above assessment and plan.  Signed by: Drue Novel, NP   Please contact Palliative Medicine Team phone at [phone] for questions and concerns.  For individual provider: See Shea Evans

## 2022-01-18 NOTE — Progress Notes (Addendum)
Patient ID: Brittany Jackson, female   DOB: 1941/07/06, 81 y.o.   MRN: [id number]     Advanced Heart Failure Rounding Note  PCP-Cardiologist: None   Subjective:    CVVH stopped 05/31, Scr 2.29>>2.56. Received 60 mg lasix IV X 1 yesterday. 1L UOP charted for day.  ? CO-OX 87%.   CVP 5-6.  Weight down.   WBC 11.5>14.4 > 27 today. AF. Last dose cefepime 05/31.  Developed itchiness and rash 05/31. No relief so far with po benadryl, pepcid, and topical hydrocortisone.  SBP stable, remains on midodrine with wide pulse pressure.  Repeat echo 05/29: EF improved to 65-70%, RV okay   Objective:   Weight Range: 118.1 kg Body mass index is 42.02 kg/m.   Vital Signs:   Temp:  [97.7 F (36.5 C)-98.5 F (36.9 C)] 98 F (36.7 C) (06/02 0240) Pulse Rate:  [56-71] 56 (06/02 0530) Resp:  [12-29] 18 (06/02 0530) BP: (116-139)/(35-48) 123/38 (06/02 0530) SpO2:  [91 %-100 %] 100 % (06/02 0530) FiO2 (%):  [28 %] 28 % (06/01 0812) Weight:  [118.1 kg] 118.1 kg (06/02 0437) Last BM Date : 01/14/22  Weight change: Filed Weights   01/16/22 0615 01/17/22 0424 01/18/22 0437  Weight: 114.9 kg 120 kg 118.1 kg    Intake/Output:   Intake/Output Summary (Last 24 hours) at 01/18/2022 0658 Last data filed at 01/18/2022 0500 Gross per 24 hour  Intake 380.85 ml  Output 1175 ml  Net -794.15 ml      Physical Exam  CVP 5-6 General:  No distress. Sitting up in bed eating breakfast. HEENT: normal Neck: supple. R IJ HD cath. Carotids 2+ bilat; no bruits.  Cor: PMI nondisplaced. Regular rate & rhythm. No rubs, gallops or murmurs. Lungs: clear Abdomen: obese, soft, nontender, nondistended, + rash Extremities: no cyanosis, clubbing, rash, diffuse rash upper and lower extremities Neuro: alert & orientedx3, cranial nerves grossly intact. moves all 4 extremities w/o difficulty. Affect pleasant   Telemetry   SR 60s-70s  Labs    CBC Recent Labs    01/17/22 0446 01/18/22 0411  WBC 14.4* 27.2*   HGB 10.7* 10.8*  HCT 32.5* 32.7*  MCV 91.5 90.8  PLT 252 546   Basic Metabolic Panel Recent Labs    01/16/22 1636 01/17/22 0446 01/18/22 0411  NA 133* 130* 130*  K 3.9 3.8 4.4  CL 100 99 98  CO2 27 24 26   GLUCOSE 117* 97 78  BUN 24* 27* 34*  CREATININE 1.92* 2.29* 2.56*  CALCIUM 8.1* 8.1* 8.2*  MG  --  2.3 2.3  PHOS 2.8  --  3.0   Liver Function Tests Recent Labs    01/17/22 0446 01/18/22 0411  AST 20 17  ALT 15 14  ALKPHOS 65 62  BILITOT 0.3 0.4  PROT 5.3* 5.0*  ALBUMIN 2.4* 2.2*   No results for input(s): LIPASE, AMYLASE in the last 72 hours. Cardiac Enzymes No results for input(s): CKTOTAL, CKMB, CKMBINDEX, TROPONINI in the last 72 hours.  BNP: BNP (last 3 results) Recent Labs    01/08/22 2356  BNP 227.4*    ProBNP (last 3 results) No results for input(s): PROBNP in the last 8760 hours.   D-Dimer No results for input(s): DDIMER in the last 72 hours. Hemoglobin A1C No results for input(s): HGBA1C in the last 72 hours.  Fasting Lipid Panel No results for input(s): CHOL, HDL, LDLCALC, TRIG, CHOLHDL, LDLDIRECT in the last 72 hours.  Thyroid Function Tests No results for input(s):  TSH, T4TOTAL, T3FREE, THYROIDAB in the last 72 hours.  Invalid input(s): FREET3   Other results:   Imaging    No results found.   Medications:     Scheduled Medications:  (feeding supplement) PROSource Plus  30 mL Oral BID BM   amiodarone  200 mg Oral BID   aspirin  81 mg Oral Daily   atorvastatin  80 mg Oral Daily   Chlorhexidine Gluconate Cloth  6 each Topical Daily   docusate sodium  100 mg Oral BID   famotidine  10 mg Oral Daily   fluticasone furoate-vilanterol  1 puff Inhalation Daily   guaiFENesin  600 mg Oral BID   insulin aspart  0-15 Units Subcutaneous TID WC   insulin aspart  0-5 Units Subcutaneous QHS   midodrine  5 mg Oral TID WC   montelukast  10 mg Oral QHS   multivitamin with minerals  1 tablet Oral Daily   nicotine  14 mg Transdermal  Daily   polyethylene glycol  17 g Oral Daily   sodium chloride flush  10-40 mL Intracatheter Q12H   sodium chloride flush  3 mL Intravenous Q12H   sodium phosphate  1 enema Rectal Once   thiamine  100 mg Oral Daily    Infusions:  sodium chloride Stopped (01/09/22 0046)   sodium chloride     heparin 1,400 Units/hr (01/17/22 1320)    PRN Medications: Place/Maintain arterial line **AND** sodium chloride, acetaminophen, albuterol, diazepam, hydrocortisone cream, ondansetron (ZOFRAN) IV, oxyCODONE, pneumococcal 20-valent conjugate vaccine, sodium chloride, traMADol, zolpidem   Assessment/Plan   1. CAD: Patient presented with ACS. Peak HS-TnI 3557. LHC with 3VD, 50-60% dLM, ?90% ostial LAD, 75% stenosis at bifurcation of mLAD and D, 75% stenosis at bifurcation of mLCx and OM.  The ostial LAD is not completely laid out so not sure on review that it is truly critical.  CABG would be a consideration if LAD disease is severe, may require FFR to determine this.  She, however, may be too high risk with severe ischemic cardiomyopathy, COPD, and renal dysfunction. PCI distal left main into LAD also an option if FFR shows severe disease.  For now, medical management given current clinical issues and recovery of LV systolic function.  No chest pain.  - ASA 81 - Continue heparin gtt - atorvastatin 80 daily - When she has stabilized medically, would favor repeat cath with FFR LM into LAD to assess significance of distal LM/ostial LAD disease prior to CABG vs PCI. This may be a while in the future.  2. Acute systolic CHF: Patient has CAD, but not sure it explains the extent of her cardiomyopathy.  ?Stress (Takotsubo-type) CMP on top of pre-existing CAD.  Low output on initial RHC with CI 1.8.  Filling pressures significantly elevated on RHC as well.  Echo initially with EF 20-25%, mildly decreased RV systolic function, trivial MR, dilated IVC. Repeat echo 05/29 with improvement in EF to 65-70% => suspect  Takotsubo CMP. Co-ox 87% > recheck, CVP 5-6 this morning.  Now off CVVH, creatinine 2.29 > 2.56. 1L UOP yesterday after 60 mg lasix IV.  - Continue midodrine 5 TID. Has wide pulse pressure, SBP okay but diastolic pressure 59F-63W 3.  Atrial fibrillation: Paroxysmal, noted initially this hospitalization.  Now in NSR. - Continue heparin gtt.  - Continue amiodarone 200 mg BID 4.  AKI: Creatinine up to 3.67. Suspect cardiorenal/ATN with low output + CIN.  With persistent hyponatremia not responding well to 3% saline and  volume not significantly elevated to suggest benefit from tolvaptan, she was started CVVH on 05/29.  This was stopped on 5/31.  She would not want long-term HD.  She had 1L UOP yesterday with IV lasix (off CVVH), creatinine 2.29 > 2.56.  - Follow renal function off CVVH. Continue to monitor for renal recovery - Nephrology on board. Appreciate assistance. 5. ID: CXR with R>L lower lobe infiltrates.  Afebrile.  Sputum culture with GPCs -> normal flora.  - Covered for HAP, course of cefepime completed 05/31 - WBC now trending back up 14>27K. AF. Check BC, UA/urine culture, sputum, ESR, procalcitonin - May require line holiday. 6. COPD: Patient was a smoker up until admission.  Bedside PFTs 5/23 FEV 1 1.09L (53%) FVC 1.68 L (61%) FEF 25-75 0.54 (39%) - aggressive pulmonary toilet 7. Hyponatremia:  Intractable to the point of requiring CVVH.  Now 130. - Maintain fluid restriction.   8. Rash: Initially discovered by patinet 05/31. No relief with benadryl, pepcid and topical hydrocortisone.  - Add atarax. No steroids for now with leukocytosis. -Reviewed meds with PharmD. ? Reaction to cephalosporin or filler/dye in amiodarone. Switch amiodarone back to IV. 9. Deconditioning - ambulate - Needs aggressive PT/OT  Length of Stay: 9  Brittany Jackson, Brittany N, PA-C  01/18/2022, 6:58 AM  Advanced Heart Failure Team Pager [phone] (M-F; 7a - 5p)  Please contact [facility] for night-coverage  after hours (5p -7a ) and weekends on [email or web address]  Patient seen with PA, agree with the above note.   She is making urine, creatinine is mildly higher at 2.56.  CVP 5-6.   She remains in NSR on amiodarone.   She has had a diffuse, erythematous rash since 5/31.  WBCs up to 27 today with no fever.  More itchy today.   General: NAD Neck: No JVD, no thyromegaly or thyroid nodule.  Lungs: Clear to auscultation bilaterally with normal respiratory effort. CV: Nondisplaced PMI.  Heart regular S1/S2, no S3/S4, no murmur.  No peripheral edema.   Abdomen: Soft, nontender, no hepatosplenomegaly, no distention.  Skin: Diffuse, erythematous rash Neurologic: Alert and oriented x 3.  Psych: Normal affect. Extremities: No clubbing or cyanosis.  HEENT: Normal.   Diffuse, pruritic erythematous rash of uncertain etiology. ?Drug eruption => she stopped cefepime yesterday.  Only other possible culprit would be the po amiodarone (transitioned a couple days ago), possible allergy to the pill dye.  - Off abx.  - Transition back to IV amiodarone for now to see if dye in pill was problem.   WBCs rising though afebrile.  ?Related to process causing the dye. ?Infection.  Would workup for UTI, PNA.  - Would get CT chest w/o contrast, has had abnormal chest CT.  - Urine culture/UA.  - Blood cultures.  - Check ESR and procalcitonin.  - Would not take HD catheter out yet.  - If she spikes fever, will need vancomycin/meropenem empirically.   Echo shows recovery of cardiac function, suspect Takotsubo.   Renal function relatively stable, continue to follow. Hopefully will plateau soon.   Brittany Jackson 01/18/2022 7:55 AM

## 2022-01-18 NOTE — Progress Notes (Signed)
CARDIAC REHAB PHASE I   Went to offer to walk with pt. Pt assisted out of bathroom, needing time to rest prior to ambulation. Pt given MI book and Takotsubo information. Will f/u and encourage ambulation as able.  [id number] Rufina Falco, RN BSN 01/18/2022 10:07 AM

## 2022-01-18 NOTE — Progress Notes (Signed)
ANTICOAGULATION CONSULT NOTE - Follow Up Consult  Pharmacy Consult for heparin Indication: chest pain/ACS and atrial fibrillation  Allergies  Allergen Reactions   Codeine Other (See Comments)    Unknown   Meprobamate Swelling   Sulfa Antibiotics Other (See Comments)    Unknown    Patient Measurements: Height: '5\' 6"'$  (167.6 cm) Weight: 118.1 kg (260 lb 5.8 oz) IBW/kg (Calculated) : 59.3 Heparin Dosing Weight: 85.7 kg  Vital Signs: Temp: 97.9 F (36.6 C) (06/02 0700) Temp Source: Oral (06/02 0700) BP: 95/72 (06/02 0700) Pulse Rate: 68 (06/02 0714)  Labs: Recent Labs    01/16/22 0358 01/16/22 1636 01/17/22 0446 01/18/22 0411  HGB 9.5*  --  10.7* 10.8*  HCT 27.9*  --  32.5* 32.7*  PLT 216  --  252 295  HEPARINUNFRC 0.48  --  0.23* 0.37  CREATININE 1.44* 1.92* 2.29* 2.56*     Estimated Creatinine Clearance: 21.8 mL/min (A) (by C-G formula based on SCr of 2.56 mg/dL (H)).  Assessment: 81 y.o. female admitted with Afib and STEMI s/p cath and awaiting CABG versus high-risk PCI. Patient is not on any anticoagulants PTA. Pharmacy has been consulted for heparin.  Heparin level 0.37 at goal on heparin drip 1400 uts/hr. No known issues with IV infusion.  No overt bleeding or complications noted.  Hgb WNL.  Goal of Therapy:  Heparin level 0.3-0.7 units/ml Monitor platelets by anticoagulation protocol: Yes   Plan:  Continue IV heparin 1400 units/hr  Daily heparin level and CBC F/u plans for oral anticoagulation eventually once renal plans more clear.   Bonnita Nasuti Pharm.D. CPP, BCPS Clinical Pharmacist [phone] 01/18/2022 7:52 AM   [facility] pharmacy phone numbers are listed on [email or web address]

## 2022-01-18 NOTE — Progress Notes (Signed)
CARDIAC REHAB PHASE I   Returned to offer to walk with pt. Pt currently having a family meeting. Will continue to follow and ambulate as able.  Rufina Falco, RN BSN 01/18/2022 2:40 PM

## 2022-01-18 NOTE — Consult Note (Signed)
NAME:  Brittany Jackson, MRN:  [id number], DOB:  [birth year], LOS: 9 ADMISSION DATE:  01/08/2022, CONSULTATION DATE:  01/18/22 REFERRING MD:  Dr. Aundra Dubin, CHIEF COMPLAINT:  Rash, Abnormal CT   History of Present Illness:  81 y/o F who presented to [facility] ER with shortness of breath.   The patient was working in her yard on 6/2 when she developed SOB. She had progressively worsening shortness of breath.  On presentation she was profoundly hypertensive (191/110) and tachycardic.  She was treated with BiPAP and NTG infusion.  EKG was concerning for inferolateral STEMI.  She was taken urgently for [facility] which showed multivessel disease, severe pulmonary hypertension, severe combined systolic & diastolic heart failure with LVEF 20-25%, PCWP 36, moderate MR, PAP mean 55-35-46.  She was recommended medical therapy and thought to be a high risk PCI in the future.  Hospital course complicated by AKI thought secondary to acute CHF exacerbation and contrast induced nephropathy, AFwRVR, hyponatremia. She required CRRT from 5/29-5/31.  The patient has expressed she would not want long term HD.  In addition to AKI, she was treated for HAP with cefepime.  Atrial fibrillation was treated with heparin and amiodarone.  The patient had recovery of LVEF to 65-70%.  She clinically improved and was taken off CRRT.  On 5/31 the patient was noted to have a diffuse raised, itchy, erythematous but not confluent rash. She had an elevated leukocytosis on 6/2 CXR remained abnormal prompting a CT of the chest.  CT imaging showed cardiomegaly, small bilateral pleural effusions with atelectasis, patchy ground glass opacities in the upper lobes, prominent main pulmonary artery.    In the past she has been by Dr. Neldon Mc of Allergy for urticaria for inflammatory dermatosis and urticaria and history of eosinophilia.   Patient currently denies fevers, chills, SOB, n/v/d.  Reports she is unable to recall what sulfa and codeine did to her in the past  in terms of allergies.   Pertinent  Medical History  HTN  CHF - combined / Takotsubo  Urticaria  Appendectomy  Tobacco Abuse  Significant Hospital Events: Including procedures, antibiotic start and stop dates in addition to other pertinent events   5/23 Admit  5/23 L/RHC > multivessel disease, severe pulmonary hypertension, severe combined systolic & diastolic heart failure with LVEF 20-25%, PCWP 36, moderate MR, PAP mean 55-35-46 6/2 PCCM consulted for rash, abnormal CT chest   Interim History / Subjective:  As above   Objective   Blood pressure 121/89, pulse 63, temperature 98.3 F (36.8 C), temperature source Oral, resp. rate (!) 21, height '5\' 6"'$  (1.676 m), weight 118.1 kg, SpO2 94 %. CVP:  [3 mmHg-6 mmHg] 3 mmHg  FiO2 (%):  [28 %] 28 %   Intake/Output Summary (Last 24 hours) at 01/18/2022 1436 Last data filed at 01/18/2022 0500 Gross per 24 hour  Intake 180 ml  Output 675 ml  Net -495 ml   Filed Weights   01/16/22 0615 01/17/22 0424 01/18/22 0437  Weight: 114.9 kg 120 kg 118.1 kg    Examination: General: elderly adult female lying in bed in NAD, family at bedside  HENT: MM pink/moist, [place] O2, anicteric, HOH Lungs: non-labored, able to speak in full paragraphs without dyspnea, lungs bilaterally clear  Cardiovascular: s1s2 RRR, SR in 60's on monitor  Abdomen: obese/soft, bsx4 active, tolerating PO's Extremities: warm/dry, diffuse raised, erythematous, non-confluent rash with subjective itching Neuro: AAOx4, speech clear, MAE / non-focal  Resolved Hospital Problem list     Assessment &  Plan:   Mild Bilateral Ground Glass Opacities  Small Pleural Effusions Diffuse Rash  Tobacco Abuse  Leukocytosis   Discussion: patient admitted with STEMI in setting of multivessel disease, CHF with Takotsubo physiology, PFT's suggestive of obstructive disease.  Smoking up to admission. Treated this admission for PNA.  Hx of urticarial rash in past but current issues apparently  different from past. She  had a jump in WBC 6/2.  Radiographic review of CT shows small effusions, bilateral upper lobe areas of small ground glass opacities.  Differential includes resolving PNA on CT (no prior imaging), viral pneumonitis, drug induced pneumonitis / eosinophilic process (unclear contribution of amiodarone, prior abx).  Less likely edema, RBILD.   -prednisone 60 mg QD x3 days for rash / pulmonary parenchymal findings  -assess eosinophils now (discussed labs needed to be drawn before prednisone) -follow up CBC in am  -intermittent CXR  -topical steroid for rash  -add zyrtec  -follow up with Allergy as outpatient  -follow up in Pulmonary Clinic for surveillance CT to ensure not multifocal adeno   Best Practice (right click and "Reselect all SmartList Selections" daily)  Per Primary   Labs   CBC: Recent Labs  Lab 01/14/22 0344 01/15/22 0430 01/16/22 0358 01/17/22 0446 01/18/22 0411  WBC 12.7* 12.0* 11.5* 14.4* 27.2*  HGB 9.4* 9.9* 9.5* 10.7* 10.8*  HCT 27.5* 29.2* 27.9* 32.5* 32.7*  MCV 89.0 88.8 90.6 91.5 90.8  PLT 217 226 216 252 371    Basic Metabolic Panel: Recent Labs  Lab 01/14/22 1459 01/15/22 0430 01/15/22 1455 01/16/22 0358 01/16/22 1636 01/17/22 0446 01/18/22 0411  NA 121* 123* 129* 131* 133* 130* 130*  K 4.4 3.5 3.6 3.6 3.9 3.8 4.4  CL 90* 92* 96* 100 100 99 98  CO2 20* '22 25 23 27 24 26  '$ GLUCOSE 180* 169* 165* 115* 117* 97 78  BUN 49* 32* 26* 22 24* 27* 34*  CREATININE 3.38* 2.18* 1.87* 1.44* 1.92* 2.29* 2.56*  CALCIUM 7.8* 7.8* 7.9* 7.6* 8.1* 8.1* 8.2*  MG  --  2.5*  --  2.5*  --  2.3 2.3  PHOS 5.4* 3.4 3.3  --  2.8  --  3.0   GFR: Estimated Creatinine Clearance: 21.8 mL/min (A) (by C-G formula based on SCr of 2.56 mg/dL (H)). Recent Labs  Lab 01/15/22 0430 01/16/22 0358 01/17/22 0446 01/18/22 0411 01/18/22 0807  PROCALCITON  --   --   --   --  0.27  WBC 12.0* 11.5* 14.4* 27.2*  --     Liver Function Tests: Recent Labs  Lab  01/14/22 0752 01/14/22 1459 01/15/22 0430 01/15/22 1455 01/16/22 0358 01/16/22 1636 01/17/22 0446 01/18/22 0411  AST 22  --  22  --  20  --  20 17  ALT 14  --  16  --  14  --  15 14  ALKPHOS 66  --  65  --  70  --  65 62  BILITOT 0.6  --  0.5  --  0.3  --  0.3 0.4  PROT 5.5*  --  5.3*  --  5.0*  --  5.3* 5.0*  ALBUMIN 2.5*   < > 2.4* 2.4* 2.4* 2.4* 2.4* 2.2*   < > = values in this interval not displayed.   No results for input(s): LIPASE, AMYLASE in the last 168 hours. No results for input(s): AMMONIA in the last 168 hours.  ABG    Component Value Date/Time   PHART 7.404 01/10/2022  0306   PCO2ART 38.7 01/10/2022 0306   PO2ART 87 01/10/2022 0306   HCO3 24.4 01/10/2022 0306   TCO2 26 01/10/2022 0306   ACIDBASEDEF 5.0 (H) 01/09/2022 0201   O2SAT 86.5 01/18/2022 0411     Coagulation Profile: No results for input(s): INR, PROTIME in the last 168 hours.  Cardiac Enzymes: No results for input(s): CKTOTAL, CKMB, CKMBINDEX, TROPONINI in the last 168 hours.  HbA1C: Hgb A1c MFr Bld  Date/Time Value Ref Range Status  01/09/2022 03:22 AM 5.7 (H) 4.8 - 5.6 % Final    Comment:    (NOTE) Pre diabetes:          5.7%-6.4%  Diabetes:              >6.4%  Glycemic control for   <7.0% adults with diabetes     CBG: Recent Labs  Lab 01/17/22 1236 01/17/22 1636 01/17/22 2235 01/18/22 0655 01/18/22 1156  GLUCAP 93 112* 88 82 94    Review of Systems: Positives in [place]   Gen: Denies fever, chills, weight change, fatigue, night sweats HEENT: Denies blurred vision, double vision, hearing loss, tinnitus, sinus congestion, rhinorrhea, sore throat, neck stiffness, dysphagia PULM: Denies shortness of breath, cough, sputum production, hemoptysis, wheezing CV: Denies chest pain, edema, orthopnea, paroxysmal nocturnal dyspnea, palpitations GI: Denies abdominal pain, nausea, vomiting, diarrhea, hematochezia, melena, constipation, change in bowel habits GU: Denies dysuria, hematuria,  polyuria, oliguria, urethral discharge Endocrine: Denies hot or cold intolerance, polyuria, polyphagia or appetite change Derm: Denies rash, itching, dry skin, scaling or peeling skin change Heme: Denies easy bruising, bleeding, bleeding gums Neuro: Denies headache, numbness, weakness, slurred speech, loss of memory or consciousness  Past Medical History:  She,  has a past medical history of CHF (congestive heart failure) ([place]), Hypertension, and Urticaria.   Surgical History:   Past Surgical History:  Procedure Laterality Date   APPENDECTOMY     BACK SURGERY     CORONARY/GRAFT ACUTE MI REVASCULARIZATION N/A 01/08/2022   Procedure: Coronary/Graft Acute MI Revascularization;  Surgeon: Leonie Man, MD;  Location: [facility] [facility];  Service: Cardiovascular;  Laterality: N/A;   HUMERUS IM NAIL Left 08/24/2013   Procedure: INTRAMEDULLARY (IM) NAIL HUMERAL;  Surgeon: Nita Sells, MD;  Location: [facility];  Service: Orthopedics;  Laterality: Left;   RIGHT HEART CATH N/A 01/08/2022   Procedure: RIGHT HEART CATH;  Surgeon: Leonie Man, MD;  Location: [facility];  Service: Cardiovascular;  Laterality: N/A;   TUBAL LIGATION       Social History:   reports that she has been smoking cigarettes. She has been smoking an average of .5 packs per day. She has never used smokeless tobacco. She reports current alcohol use. She reports that she does not use drugs.   Family History:  Her family history includes Hypertension in her sister. There is no history of Allergic rhinitis, Asthma, Angioedema, Atopy, Eczema, Immunodeficiency, or Urticaria.   Allergies Allergies  Allergen Reactions   Codeine Other (See Comments)    Unknown   Meprobamate Swelling   Sulfa Antibiotics Other (See Comments)    Unknown     Home Medications  Prior to Admission medications   Medication Sig Start Date End Date Taking? Authorizing Provider  albuterol (PROVENTIL) 4 MG tablet Take 2 mg by mouth  3 (three) times daily. 02/15/20  Yes [provider]  allopurinol (ZYLOPRIM) 100 MG tablet Take 100 mg by mouth daily. 12/19/21  Yes [provider]  Ascorbic  Acid (VITAMIN C) 1000 MG tablet Take 1,000 mg by mouth daily.   Yes [provider]  aspirin EC 81 MG tablet Take 81 mg by mouth daily.   Yes [provider]  b complex vitamins capsule Take 1 capsule by mouth daily.   Yes [provider]  cetirizine (ZYRTEC) 10 MG tablet Take 1-2 tablets 2 times per day maximum. Patient taking differently: Take 10 mg by mouth daily as needed for allergies. 04/04/20  Yes Kozlow, Donnamarie Poag, MD  diazepam (VALIUM) 10 MG tablet Take 10 mg by mouth 3 (three) times daily as needed for anxiety. 06/09/20  Yes [provider]  famotidine (PEPCID) 40 MG tablet Take 1 tablet (40 mg total) by mouth 2 (two) times daily. Patient taking differently: Take 40 mg by mouth 2 (two) times daily as needed for heartburn or indigestion. 07/18/20  Yes Kozlow, Donnamarie Poag, MD  methocarbamol (ROBAXIN) 500 MG tablet Take 500 mg by mouth every 8 (eight) hours as needed for muscle spasms.   Yes [provider]  Multiple Vitamins-Minerals (MULTI FOR HER 50+) TABS Take 1 tablet by mouth daily.   Yes [provider]  ramipril (ALTACE) 10 MG capsule Take 10 mg by mouth daily. 11/05/21  Yes [provider]  Red Yeast Rice 600 MG CAPS Take 600 mg by mouth daily.   Yes [provider]  zinc gluconate 50 MG tablet Take 50 mg by mouth daily.   Yes [provider]  famotidine (PEPCID) 20 MG tablet Take 1 tablet (20 mg total) by mouth 2 (two) times daily. Patient not taking: Reported on 01/09/2022 07/20/20   Jiles Prows, MD  montelukast (SINGULAIR) 10 MG tablet Take 1 tablet (10 mg total) by mouth at bedtime. Patient not taking: Reported on 01/09/2022 07/20/20   Jiles Prows, MD     Critical care time: n/a    Noe Gens, MSN, APRN, NP-C, AGACNP-BC [place]  [facility] 01/18/2022, 2:36 PM   Please see [email or web address] for pager details.   From 7A-7P if no response, please call [phone] After hours, please call ELink [phone]

## 2022-01-18 NOTE — Progress Notes (Signed)
Physical Therapy Treatment Patient Details Name: Brittany Jackson MRN: [id number] DOB: 1941/07/13 Today's Date: 01/18/2022   History of Present Illness Pt is an 81 y.o. female admitted 01/08/22 with STEMI, PNA, afib. S/p emergent RHC with multivessel disease. Per CTS, does not currently meet criteria for CABG/MVR; may consider high risk PCI vs repeat cath. Pt with AKI; CRRT initiated 5/29-5/31. PMH includes current smoker, COPD, CHF, HTN.    PT Comments    Pt is making great progress with mobility, ambulating up to ~410 ft today with min guard-minA using a rollator. She continues to try to ambulate quickly with poor safety awareness, but is able to identify when she needs to pause to rest. As long as pt continues to display consistent improvement, I suspect pt will be able to progress to being able to d/c home with HHPT. Will plan to continue to assess plan next session pending pt's presentation.    Recommendations for follow up therapy are one component of a multi-disciplinary discharge planning process, led by the attending physician.  Recommendations may be updated based on patient status, additional functional criteria and insurance authorization.  Follow Up Recommendations  Acute inpatient rehab (3hours/day) (vs HHPT pending pt's consistency with functional presentation)     Assistance Recommended at Discharge Intermittent Supervision/Assistance  Patient can return home with the following A little help with walking and/or transfers;A little help with bathing/dressing/bathroom;Help with stairs or ramp for entrance;Assistance with cooking/housework;Assist for transportation   Equipment Recommendations  Rollator (4 wheels);Other (comment) (shower chair)    Recommendations for Other Services       Precautions / Restrictions Precautions Precautions: Fall;Other (comment) Precaution Comments: Watch SpO2 (does not wear baseline) Restrictions Weight Bearing Restrictions: No     Mobility   Bed Mobility Overal bed mobility: Needs Assistance Bed Mobility: Supine to Sit     Supine to sit: Min assist, HOB elevated     General bed mobility comments: Increased time and cues to transition legs and use bed rail to sit L EOB, HOB elevated.    Transfers Overall transfer level: Needs assistance Equipment used: Rollator (4 wheels) Transfers: Sit to/from Stand Sit to Stand: Min guard           General transfer comment: Sit to stand from EOB to rollator, min guard for safety    Ambulation/Gait Ambulation/Gait assistance: Min guard, Min assist Gait Distance (Feet): 410 Feet Assistive device: Rollator (4 wheels) Gait Pattern/deviations: Step-through pattern, Decreased stride length, Trunk flexed Gait velocity: slightly reduced Gait velocity interpretation: >2.62 ft/sec, indicative of community ambulatory   General Gait Details: Pt with fast gait, stating "you're in my way" playfully. Needs cues to stand upright. No LOB, but light minA initially for safety, progressing to min guard. Did not bump into any obstacles today. x3 standing rest breaks   Stairs             Wheelchair Mobility    Modified Rankin (Stroke Patients Only)       Balance Overall balance assessment: Needs assistance Sitting-balance support: No upper extremity supported, Feet supported Sitting balance-Leahy Scale: Good     Standing balance support: Bilateral upper extremity supported, During functional activity, No upper extremity supported Standing balance-Leahy Scale: Fair Standing balance comment: can static stand without UE support; static and dynamic stability improved with UE support                            Cognition Arousal/Alertness: Awake/alert Behavior  During Therapy: WFL for tasks assessed/performed Overall Cognitive Status: No family/caregiver present to determine baseline cognitive functioning Area of Impairment: Attention, Safety/judgement                    Current Attention Level: Selective     Safety/Judgement: Decreased awareness of safety     General Comments: Pt appears A&Ox4. Aware of her endurance deficits, but does not appear to be very aware of how her attempts to ambulate quickly may be unsafe for her currently. Follows cues appropriately though        Exercises      General Comments        Pertinent Vitals/Pain Pain Assessment Pain Assessment: Faces Faces Pain Scale: Hurts a little bit Pain Location: generalized Pain Descriptors / Indicators: Discomfort, Tiring Pain Intervention(s): Limited activity within patient's tolerance, Monitored during session, Repositioned    Home Living                          Prior Function            PT Goals (current goals can now be found in the care plan section) Acute Rehab PT Goals Patient Stated Goal: to go home PT Goal Formulation: With patient Time For Goal Achievement: 01/25/22 Potential to Achieve Goals: Good Progress towards PT goals: Progressing toward goals    Frequency    Min 3X/week      PT Plan Discharge plan needs to be updated    Co-evaluation              AM-PAC PT "6 Clicks" Mobility   Outcome Measure  Help needed turning from your back to your side while in a flat bed without using bedrails?: A Little Help needed moving from lying on your back to sitting on the side of a flat bed without using bedrails?: A Little Help needed moving to and from a bed to a chair (including a wheelchair)?: A Little Help needed standing up from a chair using your arms (e.g., wheelchair or bedside chair)?: A Little Help needed to walk in hospital room?: A Little Help needed climbing 3-5 steps with a railing? : A Lot 6 Click Score: 17    End of Session   Activity Tolerance: Patient tolerated treatment well Patient left: in chair;with call bell/phone within reach Nurse Communication: Mobility status PT Visit Diagnosis: Unsteadiness on feet  (R26.81);Muscle weakness (generalized) (M62.81);Other abnormalities of gait and mobility (R26.89)     Time: 1740-8144 PT Time Calculation (min) (ACUTE ONLY): 25 min  Charges:  $Gait Training: 8-22 mins $Therapeutic Activity: 8-22 mins                     Moishe Spice, PT, DPT Acute Rehabilitation Services  Pager: [phone] Office: [place] 01/18/2022, 5:19 PM

## 2022-01-19 ENCOUNTER — Telehealth: Payer: Self-pay | Admitting: Pulmonary Disease

## 2022-01-19 DIAGNOSIS — I2109 ST elevation (STEMI) myocardial infarction involving other coronary artery of anterior wall: Secondary | ICD-10-CM | POA: Diagnosis not present

## 2022-01-19 DIAGNOSIS — I5041 Acute combined systolic (congestive) and diastolic (congestive) heart failure: Secondary | ICD-10-CM | POA: Diagnosis not present

## 2022-01-19 DIAGNOSIS — I2102 ST elevation (STEMI) myocardial infarction involving left anterior descending coronary artery: Secondary | ICD-10-CM | POA: Diagnosis not present

## 2022-01-19 LAB — RESPIRATORY PANEL BY PCR

## 2022-01-19 LAB — BASIC METABOLIC PANEL
Anion gap: 9 (ref 5–15)
BUN: 50 mg/dL — ABNORMAL HIGH (ref 8–23)
CO2: 23 mmol/L (ref 22–32)
Calcium: 8.7 mg/dL — ABNORMAL LOW (ref 8.9–10.3)
Chloride: 94 mmol/L — ABNORMAL LOW (ref 98–111)
Creatinine, Ser: 2.54 mg/dL — ABNORMAL HIGH (ref 0.44–1.00)
GFR, Estimated: 18 mL/min — ABNORMAL LOW (ref >60)
Glucose, Bld: 135 mg/dL — ABNORMAL HIGH (ref 70–99)
Potassium: 4.5 mmol/L (ref 3.5–5.1)
Sodium: 126 mmol/L — ABNORMAL LOW (ref 135–145)

## 2022-01-19 LAB — CBC WITH DIFFERENTIAL/PLATELET
Abs Immature Granulocytes: 0.9 10*3/uL — ABNORMAL HIGH (ref 0.00–0.07)
Basophils Absolute: 0.1 10*3/uL (ref 0.0–0.1)
Basophils Relative: 0 %
Eosinophils Absolute: 0.6 10*3/uL — ABNORMAL HIGH (ref 0.0–0.5)
Eosinophils Relative: 2 %
HCT: 31.6 % — ABNORMAL LOW (ref 36.0–46.0)
Hemoglobin: 10.7 g/dL — ABNORMAL LOW (ref 12.0–15.0)
Immature Granulocytes: 3 %
Lymphocytes Relative: 10 %
Lymphs Abs: 2.8 10*3/uL (ref 0.7–4.0)
MCH: 30.4 pg (ref 26.0–34.0)
MCHC: 33.9 g/dL (ref 30.0–36.0)
MCV: 89.8 fL (ref 80.0–100.0)
Monocytes Absolute: 0.8 10*3/uL (ref 0.1–1.0)
Monocytes Relative: 3 %
Neutro Abs: 24.2 10*3/uL — ABNORMAL HIGH (ref 1.7–7.7)
Neutrophils Relative %: 82 %
Platelets: 286 10*3/uL (ref 150–400)
RBC: 3.52 MIL/uL — ABNORMAL LOW (ref 3.87–5.11)
RDW: 14.4 % (ref 11.5–15.5)
WBC: 29.3 10*3/uL — ABNORMAL HIGH (ref 4.0–10.5)
nRBC: 0 % (ref 0.0–0.2)

## 2022-01-19 LAB — GLUCOSE, CAPILLARY
Glucose-Capillary: 166 mg/dL — ABNORMAL HIGH (ref 70–99)
Glucose-Capillary: 135 mg/dL — ABNORMAL HIGH (ref 70–99)
Glucose-Capillary: 128 mg/dL — ABNORMAL HIGH (ref 70–99)
Glucose-Capillary: 147 mg/dL — ABNORMAL HIGH (ref 70–99)

## 2022-01-19 LAB — COMPREHENSIVE METABOLIC PANEL
ALT: 16 U/L (ref 0–44)
AST: 17 U/L (ref 15–41)
Albumin: 2.3 g/dL — ABNORMAL LOW (ref 3.5–5.0)
Alkaline Phosphatase: 71 U/L (ref 38–126)
Anion gap: 9 (ref 5–15)
BUN: 44 mg/dL — ABNORMAL HIGH (ref 8–23)
CO2: 23 mmol/L (ref 22–32)
Calcium: 8.2 mg/dL — ABNORMAL LOW (ref 8.9–10.3)
Chloride: 94 mmol/L — ABNORMAL LOW (ref 98–111)
Creatinine, Ser: 2.48 mg/dL — ABNORMAL HIGH (ref 0.44–1.00)
GFR, Estimated: 19 mL/min — ABNORMAL LOW (ref >60)
Glucose, Bld: 225 mg/dL — ABNORMAL HIGH (ref 70–99)
Potassium: 4.7 mmol/L (ref 3.5–5.1)
Sodium: 126 mmol/L — ABNORMAL LOW (ref 135–145)
Total Bilirubin: 0.3 mg/dL (ref 0.3–1.2)
Total Protein: 5.2 g/dL — ABNORMAL LOW (ref 6.5–8.1)

## 2022-01-19 LAB — COOXEMETRY PANEL
Carboxyhemoglobin: 1.3 % (ref 0.5–1.5)
Methemoglobin: <0.7 % (ref 0.0–1.5)
O2 Saturation: 75.1 %
Total hemoglobin: 10.5 g/dL — ABNORMAL LOW (ref 12.0–16.0)

## 2022-01-19 LAB — HEPARIN LEVEL (UNFRACTIONATED): Heparin Unfractionated: 0.43 IU/mL (ref 0.30–0.70)

## 2022-01-19 LAB — URINE CULTURE

## 2022-01-19 LAB — MAGNESIUM: Magnesium: 2.2 mg/dL (ref 1.7–2.4)

## 2022-01-19 LAB — PHOSPHORUS: Phosphorus: 3.1 mg/dL (ref 2.5–4.6)

## 2022-01-19 MED ORDER — FUROSEMIDE 10 MG/ML IJ SOLN
80.0000 mg | Freq: Two times a day (BID) | INTRAMUSCULAR | Status: AC
  Administered 2022-01-19 (×2): 80 mg via INTRAVENOUS
  Filled 2022-01-19 (×2): qty 8

## 2022-01-19 NOTE — Progress Notes (Signed)
[facility] Progress Note    Assessment/ Plan:   AKI on CKD3a (bl Cr ~1.1) -AKI likely multifactorial: Secondary to acute CHF exacerbation and CIN.  Likely ATN at this point. -Was nonoliguric however no robust response with Lasix initially.  Started CRRT on 5/29 especially as her kidney function is a limiting factor for further management of her cardiac conditions. S/P RIJ temp line placement on 5/29.  -s/p CRRT from 5/29-5/31/23.  -Cr relatively stable today. Maintain HD catheter for now. To receive lasix today -palliative following for GOC, appreciate assistance -Understandably, patient does not want long-term dialysis and hopefully this will not be the case given that she has a relatively okay baseline kidney function.  If kidney function worsens to the point where she is going to require further dialysis, then would recommend palliative care consultation to address goals of care  Hyponatremia -Thought to be secondary to CHF exacerbation in conjunction with AKI/free water excretion deficit -No real response with loop diuretics, tolvaptan, 3%NS initially -Na down to 126 today -fluid restrict 1.5L/day. Lasix '80mg'$  x 2 doses 6/3. If no improvement in Na then would consider tolvaptan x 1 dose without fluid restriction tomorrow -urine + osm studies ordered  STEMI -multivessel disease -high risk PCI down the road  Acute sCHF -EF 20-25%-likely stress cardiomyopathy, EF now improved to 65-70%. On midodrine. CVP back up-to receive lasix today as above  HAP -s/p cefepime  COPD -per primary service  Diffuse Rash, Leukocytosis -per primary service. Drug related (cephalosporin vs dye from PO amio)? -on prednisone and IV amio now  Discussed w/ primary service.   Subjective:   No acute events overnight. CVP up to ~15 today. Discussed with daughter over the phone yesterday, tried calling just now but no answer Na down to 126 Denies any nausea/vomiting/loss of  appetite/dysgeusia   Objective:   BP (!) 144/52 (BP Location: Right Arm)   Pulse 63   Temp 99.2 F (37.3 C)   Resp 14   Ht '5\' 6"'$  (1.676 m)   Wt 121.1 kg   SpO2 95%   BMI 43.09 kg/m   Intake/Output Summary (Last 24 hours) at 01/19/2022 0859 Last data filed at 01/19/2022 5027 Gross per 24 hour  Intake 1540.97 ml  Output 630 ml  Net 910.97 ml   Weight change: 3 kg  Physical Exam: Gen:nad, sitting up in bed eating breakfast CVS:RRR Resp: cta bl, no w/r/r/c, unlabored XAJ:OINO Ext: no sig edema Skin: diffuse rash Neuro: awake, alert,  moves all ext spontaneously Dialysis access: RIJ temp HD cath: c/d/i  Imaging: CT CHEST WO CONTRAST  Result Date: 01/18/2022 CLINICAL DATA:  Pneumonia EXAM: CT CHEST WITHOUT CONTRAST TECHNIQUE: Multidetector CT imaging of the chest was performed following the standard protocol without IV contrast. RADIATION DOSE REDUCTION: This exam was performed according to the departmental dose-optimization program which includes automated exposure control, adjustment of the mA and/or kV according to patient size and/or use of iterative reconstruction technique. COMPARISON:  Chest radiograph 01/14/2022 FINDINGS: Cardiovascular: Right IJ line tip: SVC. Left PICC line tip: Right atrium. Coronary, aortic arch, and branch vessel atherosclerotic vascular disease. Mildly prominent main pulmonary artery raising the possibility of pulmonary arterial hypertension. Mild cardiomegaly. Mediastinum/Nodes: Unremarkable Lungs/Pleura: Small bilateral pleural effusions with passive atelectasis. Patchy ground-glass density regions with interstitial accentuation in the upper lobes, favoring edema or inflammatory etiology based on morphology and patchy appearance. Airway thickening is present, suggesting bronchitis or reactive airways disease. Faint patchy subpleural ground-glass opacity in the left upper  lobe. Upper Abdomen: Abdominal aortic atherosclerosis. Musculoskeletal: Degenerative  sternoclavicular arthropathy bilaterally. Left glenohumeral arthropathy with possible avascular necrosis in the left humeral head; hardware in left proximal humerus on the scout image. Thoracic spondylosis noted. IMPRESSION: 1. Cardiomegaly with small bilateral pleural effusions and passive atelectasis. There is some patchy regions of indistinct ground-glass and interstitial opacity primarily in the upper lobes, query resolving edema versus alveolitis. 2. Prominent main pulmonary artery raising the possibility of pulmonary arterial hypertension. 3. Airway thickening is present, suggesting bronchitis or reactive airways disease. 4. Potential avascular necrosis in the left humeral head, partially included on today's exam. 5.  Aortic Atherosclerosis (ICD10-I70.0).  Coronary atherosclerosis. Electronically Signed   By: Van Clines M.D.   On: 01/18/2022 12:57    Labs: BMET Recent Labs  Lab 01/14/22 1459 01/15/22 0430 01/15/22 1455 01/16/22 0358 01/16/22 1636 01/17/22 0446 01/18/22 0411 01/18/22 1753 01/19/22 0501  NA 121* 123* 129* 131* 133* 130* 130* 127* 126*  K 4.4 3.5 3.6 3.6 3.9 3.8 4.4 4.5 4.7  CL 90* 92* 96* 100 100 99 98 95* 94*  CO2 20* '22 25 23 27 24 26 23 23  '$ GLUCOSE 180* 169* 165* 115* 117* 97 78 101* 225*  BUN 49* 32* 26* 22 24* 27* 34* 42* 44*  CREATININE 3.38* 2.18* 1.87* 1.44* 1.92* 2.29* 2.56* 2.63* 2.48*  CALCIUM 7.8* 7.8* 7.9* 7.6* 8.1* 8.1* 8.2* 8.0* 8.2*  PHOS 5.4* 3.4 3.3  --  2.8  --  3.0 2.9 3.1   CBC Recent Labs  Lab 01/17/22 0446 01/18/22 0411 01/18/22 0807 01/19/22 0752  WBC 14.4* 27.2* 28.1* 29.3*  NEUTROABS  --   --  22.2* PENDING  HGB 10.7* 10.8* 11.6* 10.7*  HCT 32.5* 32.7* 33.6* 31.6*  MCV 91.5 90.8 88.9 89.8  PLT 252 295 281 286    Medications:     (feeding supplement) PROSource Plus  30 mL Oral BID BM   aspirin  81 mg Oral Daily   atorvastatin  80 mg Oral Daily   Chlorhexidine Gluconate Cloth  6 each Topical Daily   famotidine  10 mg  Oral Daily   fluticasone furoate-vilanterol  1 puff Inhalation Daily   furosemide  80 mg Intravenous BID   guaiFENesin  600 mg Oral BID   insulin aspart  0-15 Units Subcutaneous TID WC   insulin aspart  0-5 Units Subcutaneous QHS   loratadine  10 mg Oral Daily   midodrine  5 mg Oral TID WC   montelukast  10 mg Oral QHS   multivitamin with minerals  1 tablet Oral Daily   nicotine  14 mg Transdermal Daily   polyethylene glycol  17 g Oral Daily   predniSONE  60 mg Oral Q breakfast   sodium chloride flush  10-40 mL Intracatheter Q12H   sodium chloride flush  3 mL Intravenous Q12H   sodium phosphate  1 enema Rectal Once   thiamine  100 mg Oral Daily    Gean Quint, MD [facility] 01/19/2022, 8:59 AM

## 2022-01-19 NOTE — Progress Notes (Signed)
CARDIAC REHAB PHASE I   PRE:  Rate/Rhythm: 51 SB    BP: sitting 146/50    SaO2: 95 RA  MODE:  Ambulation: 370 ft   POST:  Rate/Rhythm: 78 SR    BP: sitting 116/91     SaO2: 92 RA  Awoke pt in bed. Pt insistent on prep for ambulation which included brushing teeth in bed, toilet, face cream. She ambulated to BR with RW quickly, difficulty with steering due to quick pace. Sts she does not like RW and prefers rollator. Washed hands in BR independently at sink. Pt requests 30 min to 1 hour prep time next time she ambulates, specifically in the am.  Used rollator in hall, light gait belt for security but pt without true assist. Pt with quick pace with fatigue and SOB, requiring x3 rest stops. SOB after walk sitting in recliner, some wheeze. SaO2 92 RA. Practiced IS/flutter.  Fernando Salinas, ACSM 01/19/2022 11:12 AM

## 2022-01-19 NOTE — Telephone Encounter (Signed)
Please arrange hospital follow up in 3 months: abnormal CT chest, cigarette smoker  [facility] [employer]

## 2022-01-19 NOTE — Progress Notes (Signed)
NAME:  Brittany Jackson, MRN:  [id number], DOB:  [birth year], LOS: 29 ADMISSION DATE:  01/08/2022, CONSULTATION DATE:  6/2 REFERRING MD:  Aundra Dubin, CHIEF COMPLAINT:  Rash   History of Present Illness:  81 y/o female presented to the ER with dyspnea, noted ot have inferolateral STEMI so went ot the cath lab where she was found to have:    Mid LM to Dist LM lesion is 60% stenosed.   Dist LM to Ost LAD lesion is 90% stenosed.   Mid LAD lesion is 60% stenosed with 75% stenosed side branch in 1st Diag.   Prox Cx lesion is 70% stenosed with 75% stenosed side branch in LPAV.   Mid RCA-2 lesion is 80% stenosed.   Hemodynamic findings consistent with SEVERE PULMONARY HYPERTENSION.   There is no aortic valve stenosis.   There is moderate (3+) mitral regurgitation. RHC findings: PAP-mean 55/35-46 mmHg, RVP-RVEDP 54/29-17 million mercury, RAP 18 mmHg; PCWP 37 mmHg with LVP-EDP 126/48-36 mmHg; AoP-MAP 113/66-80 mmHg Treated with CRRT from 5/29-5/31, had HCAP treated with cefepime, atrial fibrillation treated with amiodarone.  Initial LVEF 25% but recovered to 65-70% after several days, came off CRRT.  PCCM consulted in the setting of an abnormal CT chest showing patchy GGO and small bilateral pleural effusions, atelectasis LLL.   Pertinent  Medical History  CHF Hypertension Urticaria  Significant Hospital Events: Including procedures, antibiotic start and stop dates in addition to other pertinent events   5/23 Admit  5/23 L/RHC > multivessel disease, severe pulmonary hypertension, severe combined systolic & diastolic heart failure with LVEF 20-25%, PCWP 36, moderate MR, PAP mean 55-35-46 6/2 PCCM consulted for rash, abnormal CT chest showing emphysema, small patchy GGO, trace pleural effusion  Interim History / Subjective:  Feels OK Walked around today Still has leg swelling  Objective   Blood pressure (!) 144/52, pulse 65, temperature 98.2 F (36.8 C), resp. rate 20, height '5\' 6"'$  (1.676 m), weight  121.1 kg, SpO2 94 %. CVP:  [1 mmHg-28 mmHg] 28 mmHg      Intake/Output Summary (Last 24 hours) at 01/19/2022 0727 Last data filed at 01/19/2022 0973 Gross per 24 hour  Intake 1837.73 ml  Output 630 ml  Net 1207.73 ml   Filed Weights   01/17/22 0424 01/18/22 0437 01/19/22 0500  Weight: 120 kg 118.1 kg 121.1 kg    Examination:  General:  Resting comfortably in bed HENT: NCAT OP clear PULM: CTA B, normal effort CV: RRR, no mgr GI: BS+, soft, nontender MSK: normal bulk and tone Derm: erythematous rash noted, ankle swelling noted Neuro: awake, alert, no distress, MAEW    Resolved Hospital Problem list     Assessment & Plan:  Abnormal CT chest> emphysema, effusions due to volume overload, patchy GGO most likely resolving pneumonia but given smoking history need attention on follow up Small bilateral pleural effusions Diffuse rash > drug reaction? History of urticaria Tobacco abuse  Discussion: Seems to be generally improving.  Unclear to me what caused the rash.  I suspect the GGO are findings of resolving pneumonia  Plan: Continue prednisone to complete three day course Would not extend prednisone unless pulmonary symptoms worsen Needs outpatient CT chest in 3 months Needs pulmonary follow up, PFT (have arranged) Counsel to quit smoking  PCCM available prn, call if questions.    Best Practice (right click and "Reselect all SmartList Selections" daily)   Per primary  Roselie Awkward, MD [facility] Pager: [phone] Cell: [phone] After 7:00 pm call Elink  (  [phone]    

## 2022-01-19 NOTE — Progress Notes (Signed)
ANTICOAGULATION CONSULT NOTE - Follow Up Consult  Pharmacy Consult for heparin Indication: chest pain/ACS and atrial fibrillation  Allergies  Allergen Reactions   Codeine Other (See Comments)    Unknown   Meprobamate Swelling   Sulfa Antibiotics Other (See Comments)    Unknown    Patient Measurements: Height: '5\' 6"'$  (167.6 cm) Weight: 121.1 kg (266 lb 15.6 oz) IBW/kg (Calculated) : 59.3 Heparin Dosing Weight: 85.7 kg  Vital Signs: Temp: 98.2 F (36.8 C) (06/03 0245) Temp Source: Oral (06/02 2000) BP: 144/52 (06/03 0530) Pulse Rate: 65 (06/03 0530)  Labs: Recent Labs    01/17/22 0446 01/18/22 0411 01/18/22 0807 01/18/22 1753 01/19/22 0501  HGB 10.7* 10.8* 11.6*  --   --   HCT 32.5* 32.7* 33.6*  --   --   PLT 252 295 281  --   --   HEPARINUNFRC 0.23* 0.37  --   --  0.43  CREATININE 2.29* 2.56*  --  2.63* 2.48*     Estimated Creatinine Clearance: 22.8 mL/min (A) (by C-G formula based on SCr of 2.48 mg/dL (H)).  Assessment: 81 y.o. female admitted with Afib and STEMI s/p cath and awaiting CABG versus high-risk PCI. Patient is not on any anticoagulants PTA. Pharmacy has been consulted for heparin.  Heparin level 0.43 at goal on heparin drip 1400 uts/hr.  Hgb has been WNL.  Goal of Therapy:  Heparin level 0.3-0.7 units/ml Monitor platelets by anticoagulation protocol: Yes   Plan:  Continue IV heparin 1400 units/hr  Daily heparin level and CBC F/u plans for oral anticoagulation eventually once renal plans more clear.   Hildred Laser, PharmD Clinical Pharmacist **Pharmacist phone directory can now be found on [email or web address] ([email or web address]).  Listed under [place].

## 2022-01-19 NOTE — Plan of Care (Signed)

## 2022-01-19 NOTE — Progress Notes (Signed)
Updated daughter Colletta [place]) about the plan from a nephrology perspective over the phone just now.  Brittany Quint, MD [facility]

## 2022-01-19 NOTE — Progress Notes (Addendum)
Palliative: Brittany Jackson is sitting up in the [place] chair in her room.  She greets me, making and mostly keeping eye contact.  She appears chronically ill, obese.  She is alert and oriented x3, able to make her basic needs known.  She has a family friend at bedside visiting who leaves not longer after I arrived.  We talk about her day.  [name] states that she participated in therapy this morning, and per nursing staff she did very well.  She shares that she spoke with the kidney doctor and is able to make her wishes clear.  She shares that she is agreeable to short-term rehab, but would never agree to live in a nursing home, she would rather die in her own home.  She tells a story of how she cared for her mother in her home during her mother's last days of life.  Conference with bedside nursing staff related to patient condition, needs, goals of care, disposition.   Plan:   Continue full scope/full code.  Time for outcomes.  Agreeable to CIR for rehab with ultimate goal of returning to her own home.  25 minutes Quinn Axe, NP Palliative medicine team Team phone [phone] Greater than 50% of this time was spent counseling and coordinating care related to the above assessment and plan.

## 2022-01-19 NOTE — Progress Notes (Signed)
Patient ID: Brittany Jackson, female   DOB: [birth year], 81 y.o.   MRN: [id number]     Advanced Heart Failure Rounding Note  PCP-Cardiologist: None   Subjective:    CVVH stopped 05/31, Scr 2.29>>2.56>>2.48. CVP 15-16 today, co-ox 75%.  Na dropping again to 126.   WBCs up to 28 yesterday with development of diffuse erythematous rash. Last dose cefepime 05/31.  CT chest  6/2 showed multifocal GGO and pleural effusions.  Seen by CCM, thought most likely drug rxn, less likely inflammatory dermatosis with inflammatory PNA. She was started on steroid course.  Rash appears less prominent to me.  We also stopped po amiodarone (?rxn to dye in pill).   BP remains stable, still on low dose midodrine.   Repeat echo 05/29: EF improved to 65-70%, RV okay   Objective:   Weight Range: 121.1 kg Body mass index is 43.09 kg/m.   Vital Signs:   Temp:  [97.9 F (36.6 C)-99.2 F (37.3 C)] 98.2 F (36.8 C) (06/03 0245) Pulse Rate:  [57-98] 65 (06/03 0530) Resp:  [11-35] 20 (06/03 0530) BP: (74-151)/(36-89) 144/52 (06/03 0530) SpO2:  [70 %-100 %] 94 % (06/03 0530) Weight:  [121.1 kg] 121.1 kg (06/03 0500) Last BM Date : 01/18/22  Weight change: Filed Weights   01/17/22 0424 01/18/22 0437 01/19/22 0500  Weight: 120 kg 118.1 kg 121.1 kg    Intake/Output:   Intake/Output Summary (Last 24 hours) at 01/19/2022 0748 Last data filed at 01/19/2022 0702 Gross per 24 hour  Intake 1837.73 ml  Output 630 ml  Net 1207.73 ml      Physical Exam  CVP 15-16 General: NAD Neck: JVP 14+, no thyromegaly or thyroid nodule.  Lungs: Clear to auscultation bilaterally with normal respiratory effort. CV: Nondisplaced PMI.  Heart regular S1/S2, no S3/S4, no murmur.  No peripheral edema.   Abdomen: Soft, nontender, no hepatosplenomegaly, no distention.  Skin: Erythematous macular rash Neurologic: Alert and oriented x 3.  Psych: Normal affect. Extremities: No clubbing or cyanosis.  HEENT: Normal.   Telemetry    SR 60s-70s  Labs    CBC Recent Labs    01/18/22 0411 01/18/22 0807  WBC 27.2* 28.1*  NEUTROABS  --  22.2*  HGB 10.8* 11.6*  HCT 32.7* 33.6*  MCV 90.8 88.9  PLT 295 017   Basic Metabolic Panel Recent Labs    01/18/22 0411 01/18/22 1753 01/19/22 0501  NA 130* 127* 126*  K 4.4 4.5 4.7  CL 98 95* 94*  CO2 '26 23 23  '$ GLUCOSE 78 101* 225*  BUN 34* 42* 44*  CREATININE 2.56* 2.63* 2.48*  CALCIUM 8.2* 8.0* 8.2*  MG 2.3  --  2.2  PHOS 3.0 2.9 3.1   Liver Function Tests Recent Labs    01/18/22 0411 01/18/22 1753 01/19/22 0501  AST 17  --  17  ALT 14  --  16  ALKPHOS 62  --  71  BILITOT 0.4  --  0.3  PROT 5.0*  --  5.2*  ALBUMIN 2.2* 2.1* 2.3*   No results for input(s): LIPASE, AMYLASE in the last 72 hours. Cardiac Enzymes No results for input(s): CKTOTAL, CKMB, CKMBINDEX, TROPONINI in the last 72 hours.  BNP: BNP (last 3 results) Recent Labs    01/08/22 2356  BNP 227.4*    ProBNP (last 3 results) No results for input(s): PROBNP in the last 8760 hours.   D-Dimer No results for input(s): DDIMER in the last 72 hours. Hemoglobin A1C No results  for input(s): HGBA1C in the last 72 hours.  Fasting Lipid Panel No results for input(s): CHOL, HDL, LDLCALC, TRIG, CHOLHDL, LDLDIRECT in the last 72 hours.  Thyroid Function Tests No results for input(s): TSH, T4TOTAL, T3FREE, THYROIDAB in the last 72 hours.  Invalid input(s): FREET3   Other results:   Imaging    CT CHEST WO CONTRAST  Result Date: 01/18/2022 CLINICAL DATA:  Pneumonia EXAM: CT CHEST WITHOUT CONTRAST TECHNIQUE: Multidetector CT imaging of the chest was performed following the standard protocol without IV contrast. RADIATION DOSE REDUCTION: This exam was performed according to the departmental dose-optimization program which includes automated exposure control, adjustment of the mA and/or kV according to patient size and/or use of iterative reconstruction technique. COMPARISON:  Chest  radiograph 01/14/2022 FINDINGS: Cardiovascular: Right IJ line tip: SVC. Left PICC line tip: Right atrium. Coronary, aortic arch, and branch vessel atherosclerotic vascular disease. Mildly prominent main pulmonary artery raising the possibility of pulmonary arterial hypertension. Mild cardiomegaly. Mediastinum/Nodes: Unremarkable Lungs/Pleura: Small bilateral pleural effusions with passive atelectasis. Patchy ground-glass density regions with interstitial accentuation in the upper lobes, favoring edema or inflammatory etiology based on morphology and patchy appearance. Airway thickening is present, suggesting bronchitis or reactive airways disease. Faint patchy subpleural ground-glass opacity in the left upper lobe. Upper Abdomen: Abdominal aortic atherosclerosis. Musculoskeletal: Degenerative sternoclavicular arthropathy bilaterally. Left glenohumeral arthropathy with possible avascular necrosis in the left humeral head; hardware in left proximal humerus on the scout image. Thoracic spondylosis noted. IMPRESSION: 1. Cardiomegaly with small bilateral pleural effusions and passive atelectasis. There is some patchy regions of indistinct ground-glass and interstitial opacity primarily in the upper lobes, query resolving edema versus alveolitis. 2. Prominent main pulmonary artery raising the possibility of pulmonary arterial hypertension. 3. Airway thickening is present, suggesting bronchitis or reactive airways disease. 4. Potential avascular necrosis in the left humeral head, partially included on today's exam. 5.  Aortic Atherosclerosis (ICD10-I70.0).  Coronary atherosclerosis. Electronically Signed   By: Van Clines M.D.   On: 01/18/2022 12:57     Medications:     Scheduled Medications:  (feeding supplement) PROSource Plus  30 mL Oral BID BM   aspirin  81 mg Oral Daily   atorvastatin  80 mg Oral Daily   Chlorhexidine Gluconate Cloth  6 each Topical Daily   famotidine  10 mg Oral Daily   fluticasone  furoate-vilanterol  1 puff Inhalation Daily   furosemide  80 mg Intravenous BID   guaiFENesin  600 mg Oral BID   insulin aspart  0-15 Units Subcutaneous TID WC   insulin aspart  0-5 Units Subcutaneous QHS   loratadine  10 mg Oral Daily   midodrine  5 mg Oral TID WC   montelukast  10 mg Oral QHS   multivitamin with minerals  1 tablet Oral Daily   nicotine  14 mg Transdermal Daily   polyethylene glycol  17 g Oral Daily   predniSONE  60 mg Oral Q breakfast   sodium chloride flush  10-40 mL Intracatheter Q12H   sodium chloride flush  3 mL Intravenous Q12H   sodium phosphate  1 enema Rectal Once   thiamine  100 mg Oral Daily    Infusions:  sodium chloride Stopped (01/09/22 0046)   sodium chloride     amiodarone 30 mg/hr (01/19/22 0702)   heparin 1,400 Units/hr (01/19/22 0702)    PRN Medications: Place/Maintain arterial line **AND** sodium chloride, acetaminophen, albuterol, diazepam, hydrocortisone cream, hydrOXYzine, ondansetron (ZOFRAN) IV, oxyCODONE, pneumococcal 20-valent conjugate vaccine, sodium chloride,  traMADol, zolpidem   Assessment/Plan   1. CAD: Patient presented with ACS. Peak HS-TnI 3557. LHC with 3VD, 50-60% dLM, ?90% ostial LAD, 75% stenosis at bifurcation of mLAD and D, 75% stenosis at bifurcation of mLCx and OM.  The ostial LAD is not completely laid out and on review, I am not sure that it is truly critical.  CABG would be a consideration if LAD disease is severe, may require FFR to determine this. PCI distal left main into LAD also an option if FFR shows severe disease.  For now, medical management given current clinical issues and recovery of LV systolic function.  No chest pain.  - ASA 81 - Continue heparin gtt until transition to Eliquis.  - atorvastatin 80 daily - When she has stabilized medically, would favor repeat cath with FFR LM into LAD to assess significance of distal LM/ostial LAD disease. This may be a while in the future.  2. Acute systolic CHF:  Patient has CAD, but not sure it explains the extent of her initial cardiomyopathy.  ?Stress (Takotsubo-type) CMP with pre-existing CAD.  Low output on initial RHC with CI 1.8.  Filling pressures significantly elevated on RHC as well.  Echo initially with EF 20-25%, mildly decreased RV systolic function, trivial MR, dilated IVC. Repeat echo 05/29 with improvement in EF to 65-70% => suspect Takotsubo CMP.  Today, co-ox 75%.  CVP up to 15-16 on my review this morning.  - Will give Lasix 80 mg IV bid x 2 doses today and follow response.  - Continue midodrine 5 TID. Has wide pulse pressure, SBP okay but diastolic pressure 65L-93T 3.  Atrial fibrillation: Paroxysmal, noted initially this hospitalization.  Now in NSR. - Continue heparin gtt for now, eventually to Eliquis.  - Continue amiodarone IV as concern for drug rxn to dye in pill.  Will see if we can get a different type of po amiodarone for her, review with pharmacy.  4.  AKI: Creatinine up to 3.67. Suspect cardiorenal/ATN with low output + CIN.  With persistent hyponatremia not responding well to 3% saline and volume not significantly elevated to suggest benefit from tolvaptan, she was started CVVH on 05/29.  This was stopped on 5/31.  She would not want long-term HD.  Creatinine stable at 2.48 but Na falling again, 126 today.  CVP rising, up to about 15.  - Discussed with nephrology, will give IV Lasix today.  5. ID: Covered for HAP, course of cefepime and vancomycin completed 05/31.  Afebrile but WBCs elevated.  Procalcitonin not impressive.  CT chest with GGO, ?resolving PNA.   - Reviewed with CCM, will not cover again with abx.  - Cultures NGTD.  6. Rash: Diffuse erythematous rash.  She has history of inflammatory dermatosis but she thinks this looks different.  ?drug eruption (recently stopped cefepime, ?dye from po amiodarone tab).  Looks better today, less pruritic.  Now on steroids.  - Prednisone course ordered.  - CBC with diff to assess for  eosinophilia.  7. COPD: Patient was a smoker up until admission.  Bedside PFTs 5/23 FEV 1 1.09L (53%) FVC 1.68 L (61%) FEF 25-75 0.54 (39%) - aggressive pulmonary toilet 8. Hyponatremia:  Intractable to the point of requiring CVVH.  Now back down again to 126.  - Discussed with nephrology, will treat with IV Lasix today, add tolvaptan if remains low.  - Maintain fluid restriction, 1.5 L.   - BMET in pm.  9. Deconditioning - ambulate - Needs aggressive PT/OT  Length of Stay: [place], MD  01/19/2022, 7:48 AM  Advanced Heart Failure Team Pager [phone] (M-F; 7a - 5p)  Please contact [facility] for night-coverage after hours (5p -7a ) and weekends on [email or web address]

## 2022-01-20 DIAGNOSIS — I5041 Acute combined systolic (congestive) and diastolic (congestive) heart failure: Secondary | ICD-10-CM | POA: Diagnosis not present

## 2022-01-20 LAB — CBC
HCT: 29.6 % — ABNORMAL LOW (ref 36.0–46.0)
Hemoglobin: 10 g/dL — ABNORMAL LOW (ref 12.0–15.0)
MCH: 30.2 pg (ref 26.0–34.0)
MCHC: 33.8 g/dL (ref 30.0–36.0)
MCV: 89.4 fL (ref 80.0–100.0)
Platelets: 297 10*3/uL (ref 150–400)
RBC: 3.31 MIL/uL — ABNORMAL LOW (ref 3.87–5.11)
RDW: 14.4 % (ref 11.5–15.5)
WBC: 28.3 10*3/uL — ABNORMAL HIGH (ref 4.0–10.5)
nRBC: 0 % (ref 0.0–0.2)

## 2022-01-20 LAB — RENAL FUNCTION PANEL
Albumin: 2.9 g/dL — ABNORMAL LOW (ref 3.5–5.0)
Anion gap: 12 (ref 5–15)
BUN: 63 mg/dL — ABNORMAL HIGH (ref 8–23)
CO2: 24 mmol/L (ref 22–32)
Calcium: 8.9 mg/dL (ref 8.9–10.3)
Chloride: 93 mmol/L — ABNORMAL LOW (ref 98–111)
Creatinine, Ser: 2.74 mg/dL — ABNORMAL HIGH (ref 0.44–1.00)
GFR, Estimated: 17 mL/min — ABNORMAL LOW (ref >60)
Glucose, Bld: 126 mg/dL — ABNORMAL HIGH (ref 70–99)
Phosphorus: 3.6 mg/dL (ref 2.5–4.6)
Potassium: 4.4 mmol/L (ref 3.5–5.1)
Sodium: 129 mmol/L — ABNORMAL LOW (ref 135–145)

## 2022-01-20 LAB — COMPREHENSIVE METABOLIC PANEL
ALT: 16 U/L (ref 0–44)
AST: 16 U/L (ref 15–41)
Albumin: 2.6 g/dL — ABNORMAL LOW (ref 3.5–5.0)
Alkaline Phosphatase: 74 U/L (ref 38–126)
Anion gap: 9 (ref 5–15)
BUN: 54 mg/dL — ABNORMAL HIGH (ref 8–23)
CO2: 25 mmol/L (ref 22–32)
Calcium: 8.7 mg/dL — ABNORMAL LOW (ref 8.9–10.3)
Chloride: 94 mmol/L — ABNORMAL LOW (ref 98–111)
Creatinine, Ser: 2.51 mg/dL — ABNORMAL HIGH (ref 0.44–1.00)
GFR, Estimated: 19 mL/min — ABNORMAL LOW (ref >60)
Glucose, Bld: 102 mg/dL — ABNORMAL HIGH (ref 70–99)
Potassium: 4 mmol/L (ref 3.5–5.1)
Sodium: 128 mmol/L — ABNORMAL LOW (ref 135–145)
Total Bilirubin: 0.7 mg/dL (ref 0.3–1.2)
Total Protein: 5.6 g/dL — ABNORMAL LOW (ref 6.5–8.1)

## 2022-01-20 LAB — COOXEMETRY PANEL
Carboxyhemoglobin: 1.7 % — ABNORMAL HIGH (ref 0.5–1.5)
Methemoglobin: 0.7 % (ref 0.0–1.5)
O2 Saturation: 78.5 %
Total hemoglobin: 10.5 g/dL — ABNORMAL LOW (ref 12.0–16.0)

## 2022-01-20 LAB — GLUCOSE, CAPILLARY
Glucose-Capillary: 103 mg/dL — ABNORMAL HIGH (ref 70–99)
Glucose-Capillary: 108 mg/dL — ABNORMAL HIGH (ref 70–99)
Glucose-Capillary: 134 mg/dL — ABNORMAL HIGH (ref 70–99)
Glucose-Capillary: 111 mg/dL — ABNORMAL HIGH (ref 70–99)

## 2022-01-20 LAB — PHOSPHORUS: Phosphorus: 3.6 mg/dL (ref 2.5–4.6)

## 2022-01-20 LAB — HEPARIN LEVEL (UNFRACTIONATED)
Heparin Unfractionated: >1.1 IU/mL — ABNORMAL HIGH (ref 0.30–0.70)
Heparin Unfractionated: 0.39 IU/mL (ref 0.30–0.70)

## 2022-01-20 LAB — MAGNESIUM: Magnesium: 2.2 mg/dL (ref 1.7–2.4)

## 2022-01-20 MED ORDER — APIXABAN 2.5 MG PO TABS
2.5000 mg | ORAL_TABLET | Freq: Two times a day (BID) | ORAL | Status: DC
  Administered 2022-01-20 – 2022-01-22 (×5): 2.5 mg via ORAL
  Filled 2022-01-20 (×5): qty 1

## 2022-01-20 MED ORDER — FUROSEMIDE 10 MG/ML IJ SOLN
80.0000 mg | Freq: Once | INTRAMUSCULAR | Status: AC
  Administered 2022-01-20: 80 mg via INTRAVENOUS
  Filled 2022-01-20: qty 8

## 2022-01-20 NOTE — Plan of Care (Signed)

## 2022-01-20 NOTE — Progress Notes (Signed)
Physical Therapy Treatment Patient Details Name: Brittany Jackson MRN: [id number] DOB: [birth year] Today's Date: 01/20/2022   History of Present Illness Pt is an 81 y.o. female admitted 01/08/22 with STEMI, PNA, afib. S/p emergent RHC with multivessel disease. Per CTS, does not currently meet criteria for CABG/MVR; may consider high risk PCI vs repeat cath. Pt with AKI; CRRT initiated 5/29-5/31. PMH includes current smoker, COPD, CHF, HTN.    PT Comments    Pt able to perform all morning self care activities for the day with supervision but not physical assist. Pt was then able to ambulate 300' with rollator and supervision. Pt is sometimes self distracted but was still able to realize that she needed a standing rest break at 150' and 250' and initiated this. Pt gets winded when conversing while ambulating. Recommend rollator for home so pt has ability to sit when needed and can get out in the community. Changing d/c rec to HHPT and pt plans to go to her daughter's home at first where she will have 24/7 supervision. Then back to her home. PT will continue to follow.    Recommendations for follow up therapy are one component of a multi-disciplinary discharge planning process, led by the attending physician.  Recommendations may be updated based on patient status, additional functional criteria and insurance authorization.  Follow Up Recommendations  Home health PT     Assistance Recommended at Discharge Intermittent Supervision/Assistance  Patient can return home with the following A little help with walking and/or transfers;A little help with bathing/dressing/bathroom;Help with stairs or ramp for entrance;Assistance with cooking/housework;Assist for transportation   Equipment Recommendations  Rollator (4 wheels);Other (comment) (shower chair)    Recommendations for Other Services       Precautions / Restrictions Precautions Precautions: Fall;Other (comment) Precaution Comments: Watch SpO2  (does not wear baseline) Restrictions Weight Bearing Restrictions: No     Mobility  Bed Mobility Overal bed mobility: Modified Independent Bed Mobility: Supine to Sit     Supine to sit: HOB elevated, Modified independent (Device/Increase time)     General bed mobility comments: pt able to come to EOB without assist    Transfers Overall transfer level: Needs assistance Equipment used: Rollator (4 wheels) Transfers: Sit to/from Stand Sit to Stand: Supervision           General transfer comment: supervision from bed and toilet    Ambulation/Gait Ambulation/Gait assistance: Supervision Gait Distance (Feet): 300 Feet Assistive device: Rollator (4 wheels) Gait Pattern/deviations: Step-through pattern, Decreased stride length, Trunk flexed Gait velocity: WFL Gait velocity interpretation: >2.62 ft/sec, indicative of community ambulatory   General Gait Details: pt took 2 standing rest breaks, initiated by her. Was able to change direction and pace without LOB. Could converse initially but then needed to stop talking to catch breath. SpO2 in 90's on RA.   Stairs             Wheelchair Mobility    Modified Rankin (Stroke Patients Only)       Balance Overall balance assessment: Needs assistance Sitting-balance support: No upper extremity supported, Feet supported Sitting balance-Leahy Scale: Good     Standing balance support: During functional activity, No upper extremity supported Standing balance-Leahy Scale: Good Standing balance comment: limitations evident with challengs but pt able to stand at sink without support and perform self care activities without LOB  Cognition Arousal/Alertness: Awake/alert Behavior During Therapy: WFL for tasks assessed/performed Overall Cognitive Status: No family/caregiver present to determine baseline cognitive functioning Area of Impairment: Attention, Safety/judgement                    Current Attention Level: Selective   Following Commands: Follows multi-step commands inconsistently Safety/Judgement: Decreased awareness of safety Awareness: Emergent   General Comments: sometimes self distracts. Discussed self monitoring and pt can verbalize understanding of this, does not always do it funcitonally though. Will go home to daughter's initially which should help        Exercises      General Comments General comments (skin integrity, edema, etc.): niece, Juliann Pulse, present. HR in 80's with ambulation, SPO2 96%. Discussed home instead of AIR and pt agreeable. Also discussed self monitoring.      Pertinent Vitals/Pain Pain Assessment Pain Assessment: No/denies pain    Home Living                          Prior Function            PT Goals (current goals can now be found in the care plan section) Acute Rehab PT Goals Patient Stated Goal: to go home PT Goal Formulation: With patient Time For Goal Achievement: 01/25/22 Potential to Achieve Goals: Good Progress towards PT goals: Progressing toward goals    Frequency    Min 3X/week      PT Plan Discharge plan needs to be updated    Co-evaluation              AM-PAC PT "6 Clicks" Mobility   Outcome Measure  Help needed turning from your back to your side while in a flat bed without using bedrails?: None Help needed moving from lying on your back to sitting on the side of a flat bed without using bedrails?: None Help needed moving to and from a bed to a chair (including a wheelchair)?: A Little Help needed standing up from a chair using your arms (e.g., wheelchair or bedside chair)?: A Little Help needed to walk in hospital room?: A Little Help needed climbing 3-5 steps with a railing? : A Lot 6 Click Score: 19    End of Session   Activity Tolerance: Patient tolerated treatment well Patient left: in chair;with call bell/phone within reach;with family/visitor present Nurse  Communication: Mobility status PT Visit Diagnosis: Unsteadiness on feet (R26.81);Muscle weakness (generalized) (M62.81);Other abnormalities of gait and mobility (R26.89)     Time: 5374-8270 PT Time Calculation (min) (ACUTE ONLY): 50 min  Charges:  $Gait Training: 23-37 mins $Therapeutic Activity: 8-22 mins                     Leighton Roach, [place]  Pager [phone] Office [place] 01/20/2022, 11:33 AM

## 2022-01-20 NOTE — Progress Notes (Signed)
Patient ID: Brittany Jackson, female   DOB: April 14, 1941, 81 y.o.   MRN: [id number]     Advanced Heart Failure Rounding Note  PCP-Cardiologist: None   Subjective:    CVVH stopped 05/31, Scr 2.29>>2.56>>2.48>>2.5. CVP 10-11 today, co-ox 78%.  Na 126 => 128.  Good UOP, I/Os net negative -2807 with IV Lasix.   WBCs up to 28 with development of diffuse erythematous rash. Last dose cefepime 05/31.  CT chest  6/2 showed multifocal GGO and pleural effusions.  Seen by CCM, thought most likely drug rxn, less likely inflammatory dermatosis with inflammatory PNA. She was started on steroid course.  We also stopped po amiodarone (?rxn to dye in pill).   BP remains stable, still on low dose midodrine.   Repeat echo 05/29: EF improved to 65-70%, RV okay   Objective:   Weight Range: 121 kg Body mass index is 43.06 kg/m.   Vital Signs:   Temp:  [97.8 F (36.6 C)-98.6 F (37 C)] 98.6 F (37 C) (06/03 2300) Pulse Rate:  [51-68] 57 (06/04 0717) Resp:  [12-20] 19 (06/04 0717) BP: (116-190)/(41-91) 142/46 (06/04 0600) SpO2:  [90 %-98 %] 98 % (06/04 0717) FiO2 (%):  [21 %-28 %] 28 % (06/04 0717) Weight:  [121 kg] 121 kg (06/04 0500) Last BM Date : 01/18/22  Weight change: Filed Weights   01/18/22 0437 01/19/22 0500 01/20/22 0500  Weight: 118.1 kg 121.1 kg 121 kg    Intake/Output:   Intake/Output Summary (Last 24 hours) at 01/20/2022 0736 Last data filed at 01/20/2022 0600 Gross per 24 hour  Intake 1801.21 ml  Output 4700 ml  Net -2898.79 ml      Physical Exam  CVP 10-11 General: NAD Neck: JVP 10 cm, no thyromegaly or thyroid nodule.  Lungs: Clear to auscultation bilaterally with normal respiratory effort. CV: Nondisplaced PMI.  Heart regular S1/S2, no S3/S4, no murmur.  No peripheral edema.   Abdomen: Soft, nontender, no hepatosplenomegaly, no distention.  Skin: Intact without lesions or rashes.  Neurologic: Alert and oriented x 3.  Psych: Normal affect. Extremities: No clubbing or  cyanosis.  HEENT: Normal.   Telemetry   SR 50s  Labs    CBC Recent Labs    01/18/22 0807 01/19/22 0752 01/20/22 0517  WBC 28.1* 29.3* 28.3*  NEUTROABS 22.2* 24.2*  --   HGB 11.6* 10.7* 10.0*  HCT 33.6* 31.6* 29.6*  MCV 88.9 89.8 89.4  PLT 281 286 654   Basic Metabolic Panel Recent Labs    01/19/22 0501 01/19/22 1638 01/20/22 0517  NA 126* 126* 128*  K 4.7 4.5 4.0  CL 94* 94* 94*  CO2 '23 23 25  '$ GLUCOSE 225* 135* 102*  BUN 44* 50* 54*  CREATININE 2.48* 2.54* 2.51*  CALCIUM 8.2* 8.7* 8.7*  MG 2.2  --  2.2  PHOS 3.1  --  3.6   Liver Function Tests Recent Labs    01/19/22 0501 01/20/22 0517  AST 17 16  ALT 16 16  ALKPHOS 71 74  BILITOT 0.3 0.7  PROT 5.2* 5.6*  ALBUMIN 2.3* 2.6*   No results for input(s): LIPASE, AMYLASE in the last 72 hours. Cardiac Enzymes No results for input(s): CKTOTAL, CKMB, CKMBINDEX, TROPONINI in the last 72 hours.  BNP: BNP (last 3 results) Recent Labs    01/08/22 2356  BNP 227.4*    ProBNP (last 3 results) No results for input(s): PROBNP in the last 8760 hours.   D-Dimer No results for input(s): DDIMER in the  last 72 hours. Hemoglobin A1C No results for input(s): HGBA1C in the last 72 hours.  Fasting Lipid Panel No results for input(s): CHOL, HDL, LDLCALC, TRIG, CHOLHDL, LDLDIRECT in the last 72 hours.  Thyroid Function Tests No results for input(s): TSH, T4TOTAL, T3FREE, THYROIDAB in the last 72 hours.  Invalid input(s): FREET3   Other results:   Imaging    No results found.   Medications:     Scheduled Medications:  (feeding supplement) PROSource Plus  30 mL Oral BID BM   aspirin  81 mg Oral Daily   atorvastatin  80 mg Oral Daily   Chlorhexidine Gluconate Cloth  6 each Topical Daily   famotidine  10 mg Oral Daily   fluticasone furoate-vilanterol  1 puff Inhalation Daily   furosemide  80 mg Intravenous Once   guaiFENesin  600 mg Oral BID   insulin aspart  0-15 Units Subcutaneous TID WC    insulin aspart  0-5 Units Subcutaneous QHS   loratadine  10 mg Oral Daily   midodrine  5 mg Oral TID WC   montelukast  10 mg Oral QHS   multivitamin with minerals  1 tablet Oral Daily   nicotine  14 mg Transdermal Daily   polyethylene glycol  17 g Oral Daily   predniSONE  60 mg Oral Q breakfast   sodium chloride flush  10-40 mL Intracatheter Q12H   sodium chloride flush  3 mL Intravenous Q12H   sodium phosphate  1 enema Rectal Once   thiamine  100 mg Oral Daily    Infusions:  sodium chloride Stopped (01/09/22 0046)   sodium chloride      PRN Medications: Place/Maintain arterial line **AND** sodium chloride, acetaminophen, albuterol, diazepam, hydrocortisone cream, hydrOXYzine, ondansetron (ZOFRAN) IV, oxyCODONE, pneumococcal 20-valent conjugate vaccine, sodium chloride, traMADol, zolpidem   Assessment/Plan   1. CAD: Patient presented with ACS. Peak HS-TnI 3557. LHC with 3VD, 50-60% dLM, ?90% ostial LAD, 75% stenosis at bifurcation of mLAD and D, 75% stenosis at bifurcation of mLCx and OM.  The ostial LAD is not completely laid out and on review, I am not sure that it is truly critical.  CABG would be a consideration if LAD disease is severe, may require FFR to determine this. PCI distal left main into LAD also an option if FFR shows severe disease.  For now, medical management given current clinical issues and recovery of LV systolic function.  No chest pain.  - ASA 81  - atorvastatin 80 daily - When she has stabilized medically and creatinine has come down, would favor repeat cath with FFR LM into LAD to assess significance of distal LM/ostial LAD disease. This may be a while in the future.  2. Acute systolic CHF: Patient has CAD, but not sure it explains the extent of her initial cardiomyopathy.  ?Stress (Takotsubo-type) CMP with pre-existing CAD.  Low output on initial RHC with CI 1.8.  Filling pressures significantly elevated on RHC as well.  Echo initially with EF 20-25%, mildly  decreased RV systolic function, trivial MR, dilated IVC. Repeat echo 05/29 with improvement in EF to 65-70% => suspect Takotsubo CMP.  Today, co-ox 78%.  CVP 10-11 on my review this morning. Good diuresis yesterday with IV Lasix.  - Will give Lasix 80 mg IV x 1 today.   - Continue midodrine 5 TID. Has wide pulse pressure, SBP okay but diastolic pressure 66A 3.  Atrial fibrillation: Paroxysmal, noted initially this hospitalization.  Now in NSR. - Stop heparin gtt  and transition to Eliquis.   - Has been on amiodarone IV as concern for drug rxn to dye in pill.  Will stop IV amiodarone this morning, see how she does off it.  4.  AKI: Creatinine up to 3.67. Suspect cardiorenal/ATN with low output + CIN.  With persistent hyponatremia not responding well to 3% saline and volume not significantly elevated to suggest benefit from tolvaptan, she was started CVVH on 05/29.  This was stopped on 5/31.  She would not want long-term HD.  Creatinine stable at 2.5, CVP 10-11.   - Lasix 80 mg IV x 1 this morning.  - Can remove HD catheter today.  5. ID: Covered for HAP, course of cefepime and vancomycin completed 05/31.  Afebrile but WBCs elevated.  Procalcitonin not impressive.  CT chest with GGO, ?resolving PNA.   - Reviewed with CCM, will not cover again with abx.  - Cultures NGTD.  6. Rash: Diffuse erythematous rash.  She has history of inflammatory dermatosis but she thinks this looks different.  ?drug eruption (recently stopped cefepime, ?dye from po amiodarone tab).  Looks somewhat better today, less pruritic.  Now on steroids.  - Prednisone course ordered => last dose today.  7. COPD: Patient was a smoker up until admission.  Bedside PFTs 5/23 FEV 1 1.09L (53%) FVC 1.68 L (61%) FEF 25-75 0.54 (39%) - aggressive pulmonary toilet 8. Hyponatremia:  Intractable to the point of requiring CVVH.  Better today at 128.  - Discussed with nephrology, will treat with IV Lasix today again.  - Maintain fluid restriction,  1.5 L.   9. Deconditioning - ambulate - Needs aggressive PT/OT - Aim for CIR placement.   Length of Stay: [place], MD  01/20/2022, 7:36 AM  Advanced Heart Failure Team Pager [phone] (M-F; 7a - 5p)  Please contact [facility] for night-coverage after hours (5p -7a ) and weekends on [email or web address]

## 2022-01-20 NOTE — Progress Notes (Signed)
[facility] Progress Note    Assessment/ Plan:   AKI on CKD3a (bl Cr ~1.1) -AKI likely multifactorial: Secondary to acute CHF exacerbation and CIN.  Likely ATN at this point. -Was nonoliguric however no robust response with Lasix initially.  Started CRRT on 5/29 especially as her kidney function is a limiting factor for further management of her cardiac conditions. S/P RIJ temp line placement on 5/29. S/P CRRT from 5/29-5/31/23.  -Cr stable today with robust urine output in response to Lasix. Hopefully will not require renal replacement therapy. Can remove RIJ temp HD line. Lasix '80mg'$  x 1 dose per HF -palliative following for GOC, appreciate assistance -Understandably, patient does not want long-term dialysis and hopefully this will not be the case given that she has a relatively okay baseline kidney function.   Hyponatremia -Thought to be secondary to CHF exacerbation in conjunction with AKI/free water excretion deficit -No real response with loop diuretics, tolvaptan, 3%NS initially -fluid restrict 1.5L/day. Lasix '80mg'$  x 2 doses 6/3, Lasix '80mg'$  x 1 dose planned for today. If no improvement in Na then would consider tolvaptan x 1 dose without fluid restriction tomorrow. Fortunately, Na improved with diuresis, up to 128 today) -urine + osm studies ordered  STEMI -multivessel disease -high risk PCI down the road  Acute sCHF -EF 20-25%-likely stress cardiomyopathy, EF now improved to 65-70%. On midodrine. CVP better today.  HAP -s/p cefepime  COPD -per primary service  Diffuse Rash, Leukocytosis -per primary service. Drug related (cephalosporin vs dye from PO amio)? -on prednisone and IV amio now  Discussed w/ primary service.   Subjective:   No acute events overnight. Foley removed overnight. Diffuse itchiness unchanged as per patient. She does report being able to urinate post foley removal. Uop ~4.7L (net neg ~2.8L since yesterday) after receiving Lasix. Na  improved to 128 this AM.   Objective:   BP (!) 142/46 (BP Location: Right Arm)   Pulse (!) 57   Temp 98.6 F (37 C) (Oral)   Resp 19   Ht '5\' 6"'$  (1.676 m)   Wt 121 kg   SpO2 98%   BMI 43.06 kg/m   Intake/Output Summary (Last 24 hours) at 01/20/2022 0746 Last data filed at 01/20/2022 0600 Gross per 24 hour  Intake 1801.21 ml  Output 4700 ml  Net -2898.79 ml   Weight change: -0.1 kg  Physical Exam: Gen:nad, sitting up in bed CVS:RRR Resp: cta bl, no w/r/r/c, unlabored UKG:URKY Ext: no sig edema Skin: diffuse rash Neuro: awake, alert,  moves all ext spontaneously Dialysis access: RIJ temp HD cath: c/d/i  Imaging: CT CHEST WO CONTRAST  Result Date: 01/18/2022 CLINICAL DATA:  Pneumonia EXAM: CT CHEST WITHOUT CONTRAST TECHNIQUE: Multidetector CT imaging of the chest was performed following the standard protocol without IV contrast. RADIATION DOSE REDUCTION: This exam was performed according to the departmental dose-optimization program which includes automated exposure control, adjustment of the mA and/or kV according to patient size and/or use of iterative reconstruction technique. COMPARISON:  Chest radiograph 01/14/2022 FINDINGS: Cardiovascular: Right IJ line tip: SVC. Left PICC line tip: Right atrium. Coronary, aortic arch, and branch vessel atherosclerotic vascular disease. Mildly prominent main pulmonary artery raising the possibility of pulmonary arterial hypertension. Mild cardiomegaly. Mediastinum/Nodes: Unremarkable Lungs/Pleura: Small bilateral pleural effusions with passive atelectasis. Patchy ground-glass density regions with interstitial accentuation in the upper lobes, favoring edema or inflammatory etiology based on morphology and patchy appearance. Airway thickening is present, suggesting bronchitis or reactive airways disease. Faint patchy subpleural ground-glass  opacity in the left upper lobe. Upper Abdomen: Abdominal aortic atherosclerosis. Musculoskeletal: Degenerative  sternoclavicular arthropathy bilaterally. Left glenohumeral arthropathy with possible avascular necrosis in the left humeral head; hardware in left proximal humerus on the scout image. Thoracic spondylosis noted. IMPRESSION: 1. Cardiomegaly with small bilateral pleural effusions and passive atelectasis. There is some patchy regions of indistinct ground-glass and interstitial opacity primarily in the upper lobes, query resolving edema versus alveolitis. 2. Prominent main pulmonary artery raising the possibility of pulmonary arterial hypertension. 3. Airway thickening is present, suggesting bronchitis or reactive airways disease. 4. Potential avascular necrosis in the left humeral head, partially included on today's exam. 5.  Aortic Atherosclerosis (ICD10-I70.0).  Coronary atherosclerosis. Electronically Signed   By: Van Clines M.D.   On: 01/18/2022 12:57    Labs: BMET Recent Labs  Lab 01/15/22 0430 01/15/22 1455 01/16/22 0358 01/16/22 1636 01/17/22 0446 01/18/22 0411 01/18/22 1753 01/19/22 0501 01/19/22 1638 01/20/22 0517  NA 123* 129*   < > 133* 130* 130* 127* 126* 126* 128*  K 3.5 3.6   < > 3.9 3.8 4.4 4.5 4.7 4.5 4.0  CL 92* 96*   < > 100 99 98 95* 94* 94* 94*  CO2 22 25   < > '27 24 26 23 23 23 25  '$ GLUCOSE 169* 165*   < > 117* 97 78 101* 225* 135* 102*  BUN 32* 26*   < > 24* 27* 34* 42* 44* 50* 54*  CREATININE 2.18* 1.87*   < > 1.92* 2.29* 2.56* 2.63* 2.48* 2.54* 2.51*  CALCIUM 7.8* 7.9*   < > 8.1* 8.1* 8.2* 8.0* 8.2* 8.7* 8.7*  PHOS 3.4 3.3  --  2.8  --  3.0 2.9 3.1  --  3.6   < > = values in this interval not displayed.   CBC Recent Labs  Lab 01/18/22 0411 01/18/22 0807 01/19/22 0752 01/20/22 0517  WBC 27.2* 28.1* 29.3* 28.3*  NEUTROABS  --  22.2* 24.2*  --   HGB 10.8* 11.6* 10.7* 10.0*  HCT 32.7* 33.6* 31.6* 29.6*  MCV 90.8 88.9 89.8 89.4  PLT 295 281 286 297    Medications:     (feeding supplement) PROSource Plus  30 mL Oral BID BM   aspirin  81 mg Oral  Daily   atorvastatin  80 mg Oral Daily   Chlorhexidine Gluconate Cloth  6 each Topical Daily   famotidine  10 mg Oral Daily   fluticasone furoate-vilanterol  1 puff Inhalation Daily   furosemide  80 mg Intravenous Once   guaiFENesin  600 mg Oral BID   insulin aspart  0-15 Units Subcutaneous TID WC   insulin aspart  0-5 Units Subcutaneous QHS   loratadine  10 mg Oral Daily   midodrine  5 mg Oral TID WC   montelukast  10 mg Oral QHS   multivitamin with minerals  1 tablet Oral Daily   nicotine  14 mg Transdermal Daily   polyethylene glycol  17 g Oral Daily   predniSONE  60 mg Oral Q breakfast   sodium chloride flush  10-40 mL Intracatheter Q12H   sodium chloride flush  3 mL Intravenous Q12H   sodium phosphate  1 enema Rectal Once   thiamine  100 mg Oral Daily    Gean Quint, MD [facility] 01/20/2022, 7:46 AM

## 2022-01-20 NOTE — Progress Notes (Signed)
ANTICOAGULATION CONSULT NOTE - Follow Up Consult  Pharmacy Consult for heparin> apixaban Indication: chest pain/ACS and atrial fibrillation  Allergies  Allergen Reactions   Codeine Other (See Comments)    Unknown   Meprobamate Swelling   Sulfa Antibiotics Other (See Comments)    Unknown    Patient Measurements: Height: '5\' 6"'$  (167.6 cm) Weight: 121 kg (266 lb 12.1 oz) IBW/kg (Calculated) : 59.3 Heparin Dosing Weight: 85.7 kg  Vital Signs: Temp: 98.6 F (37 C) (06/03 2300) Temp Source: Oral (06/03 2300) BP: 142/46 (06/04 0600) Pulse Rate: 57 (06/04 0717)  Labs: Recent Labs    01/18/22 0807 01/18/22 1753 01/19/22 0501 01/19/22 0752 01/19/22 1638 01/20/22 0517 01/20/22 0603  HGB 11.6*  --   --  10.7*  --  10.0*  --   HCT 33.6*  --   --  31.6*  --  29.6*  --   PLT 281  --   --  286  --  297  --   HEPARINUNFRC  --   --  0.43  --   --  >1.10* 0.39  CREATININE  --    < > 2.48*  --  2.54* 2.51*  --    < > = values in this interval not displayed.     Estimated Creatinine Clearance: 22.5 mL/min (A) (by C-G formula based on SCr of 2.51 mg/dL (H)).  Assessment: 81 y.o. female admitted with Afib and STEMI s/p cath and awaiting CABG versus high-risk PCI. Patient is not on any anticoagulants PTA. Pharmacy was consulted for heparin and plans are to change to apixaban -Hg= 10, SCr= 2.5   Goal of Therapy:  Heparin level 0.3-0.7 units/ml Monitor platelets by anticoagulation protocol: Yes   Plan:  -apixaban 2.'5mg'$  po bid -Will follow patient progress   Hildred Laser, PharmD Clinical Pharmacist **Pharmacist phone directory can now be found on [email or web address] ([email or web address]).  Listed under [place].

## 2022-01-21 ENCOUNTER — Other Ambulatory Visit (HOSPITAL_COMMUNITY): Payer: Self-pay

## 2022-01-21 ENCOUNTER — Telehealth (HOSPITAL_COMMUNITY): Payer: Self-pay | Admitting: Pharmacy Technician

## 2022-01-21 DIAGNOSIS — I5041 Acute combined systolic (congestive) and diastolic (congestive) heart failure: Secondary | ICD-10-CM | POA: Diagnosis not present

## 2022-01-21 LAB — COMPREHENSIVE METABOLIC PANEL
ALT: 19 U/L (ref 0–44)
AST: 22 U/L (ref 15–41)
Albumin: 2.7 g/dL — ABNORMAL LOW (ref 3.5–5.0)
Alkaline Phosphatase: 73 U/L (ref 38–126)
Anion gap: 8 (ref 5–15)
BUN: 64 mg/dL — ABNORMAL HIGH (ref 8–23)
CO2: 27 mmol/L (ref 22–32)
Calcium: 8.7 mg/dL — ABNORMAL LOW (ref 8.9–10.3)
Chloride: 97 mmol/L — ABNORMAL LOW (ref 98–111)
Creatinine, Ser: 2.31 mg/dL — ABNORMAL HIGH (ref 0.44–1.00)
GFR, Estimated: 20 mL/min — ABNORMAL LOW (ref >60)
Glucose, Bld: 92 mg/dL (ref 70–99)
Potassium: 4.2 mmol/L (ref 3.5–5.1)
Sodium: 132 mmol/L — ABNORMAL LOW (ref 135–145)
Total Bilirubin: 0.4 mg/dL (ref 0.3–1.2)
Total Protein: 5.5 g/dL — ABNORMAL LOW (ref 6.5–8.1)

## 2022-01-21 LAB — CBC
HCT: 30.1 % — ABNORMAL LOW (ref 36.0–46.0)
Hemoglobin: 10.3 g/dL — ABNORMAL LOW (ref 12.0–15.0)
MCH: 30.5 pg (ref 26.0–34.0)
MCHC: 34.2 g/dL (ref 30.0–36.0)
MCV: 89.1 fL (ref 80.0–100.0)
Platelets: 352 10*3/uL (ref 150–400)
RBC: 3.38 MIL/uL — ABNORMAL LOW (ref 3.87–5.11)
RDW: 14.2 % (ref 11.5–15.5)
WBC: 28.5 10*3/uL — ABNORMAL HIGH (ref 4.0–10.5)
nRBC: 0 % (ref 0.0–0.2)

## 2022-01-21 LAB — CULTURE, RESPIRATORY W GRAM STAIN: Culture: NORMAL

## 2022-01-21 LAB — COOXEMETRY PANEL
Carboxyhemoglobin: 1.6 % — ABNORMAL HIGH (ref 0.5–1.5)
Methemoglobin: <0.7 % (ref 0.0–1.5)
O2 Saturation: 87.6 %
Total hemoglobin: 11.2 g/dL — ABNORMAL LOW (ref 12.0–16.0)

## 2022-01-21 LAB — GLUCOSE, CAPILLARY
Glucose-Capillary: 103 mg/dL — ABNORMAL HIGH (ref 70–99)
Glucose-Capillary: 71 mg/dL (ref 70–99)
Glucose-Capillary: 86 mg/dL (ref 70–99)
Glucose-Capillary: 93 mg/dL (ref 70–99)

## 2022-01-21 LAB — PHOSPHORUS: Phosphorus: 4.1 mg/dL (ref 2.5–4.6)

## 2022-01-21 LAB — MAGNESIUM: Magnesium: 2 mg/dL (ref 1.7–2.4)

## 2022-01-21 MED ORDER — FUROSEMIDE 10 MG/ML IJ SOLN
60.0000 mg | Freq: Once | INTRAMUSCULAR | Status: AC
Start: 2022-01-21 — End: 2022-01-21
  Administered 2022-01-21: 60 mg via INTRAVENOUS
  Filled 2022-01-21: qty 6

## 2022-01-21 MED ORDER — AMIODARONE HCL 200 MG PO TABS
200.0000 mg | ORAL_TABLET | Freq: Every day | ORAL | Status: DC
  Administered 2022-01-21 – 2022-01-22 (×2): 200 mg via ORAL
  Filled 2022-01-21 (×3): qty 1

## 2022-01-21 MED ORDER — AMIODARONE HCL 200 MG PO TABS
200.0000 mg | ORAL_TABLET | Freq: Every day | ORAL | 1 refills | Status: DC
  Filled 2022-01-21: qty 10, 10d supply, fill #0

## 2022-01-21 NOTE — Progress Notes (Signed)
Inpatient Rehab Admissions Coordinator:    Pt. Ambulating and transferring at supervision level with therapy now recommending home health. CIR will sign off.   Clemens Catholic, [place], [place] Admissions Coordinator  [phone] ([place]) [phone] (office)

## 2022-01-21 NOTE — Progress Notes (Signed)
Patient ID: Brittany Jackson, female   DOB: [birth year], 81 y.o.   MRN: [id number] S: No new complaints but still upset about her rash and lower extremity edema. O:BP (!) 125/43 (BP Location: Right Arm)   Pulse 60   Temp (!) 97.4 F (36.3 C) (Oral)   Resp 17   Ht '5\' 6"'$  (1.676 m)   Wt 116.3 kg   SpO2 96%   BMI 41.38 kg/m   Intake/Output Summary (Last 24 hours) at 01/21/2022 1046 Last data filed at 01/21/2022 0800 Gross per 24 hour  Intake 2333.28 ml  Output 4325 ml  Net -1991.72 ml   Intake/Output: I/O last 3 completed shifts: In: 2240.1 [P.O.:1830; I.V.:410.1] Out: 7925 [Urine:7925]  Intake/Output this shift:  Total I/O In: 270 [P.O.:240; I.V.:30] Out: -  Weight change: -4.7 kg Gen:NAD CVS: RRR Resp: CTA Abd: +BS, soft, NT/ND Ext:1+ pretibial edema bilaterally DERM:  diffuse maculopapular rash on body, arms and legs.  Recent Labs  Lab 01/15/22 0430 01/15/22 1455 01/16/22 0358 01/16/22 1636 01/17/22 0446 01/18/22 0411 01/18/22 1753 01/19/22 0501 01/19/22 1638 01/20/22 0517 01/20/22 1539 01/21/22 0530  NA 123*   < > 131* 133* 130* 130* 127* 126* 126* 128* 129* 132*  K 3.5   < > 3.6 3.9 3.8 4.4 4.5 4.7 4.5 4.0 4.4 4.2  CL 92*   < > 100 100 99 98 95* 94* 94* 94* 93* 97*  CO2 22   < > '23 27 24 26 23 23 23 25 24 27  '$ GLUCOSE 169*   < > 115* 117* 97 78 101* 225* 135* 102* 126* 92  BUN 32*   < > 22 24* 27* 34* 42* 44* 50* 54* 63* 64*  CREATININE 2.18*   < > 1.44* 1.92* 2.29* 2.56* 2.63* 2.48* 2.54* 2.51* 2.74* 2.31*  ALBUMIN 2.4*   < > 2.4* 2.4* 2.4* 2.2* 2.1* 2.3*  --  2.6* 2.9* 2.7*  CALCIUM 7.8*   < > 7.6* 8.1* 8.1* 8.2* 8.0* 8.2* 8.7* 8.7* 8.9 8.7*  PHOS 3.4   < >  --  2.8  --  3.0 2.9 3.1  --  3.6 3.6 4.1  AST 22  --  20  --  20 17  --  17  --  16  --  22  ALT 16  --  14  --  15 14  --  16  --  16  --  19   < > = values in this interval not displayed.   Liver Function Tests: Recent Labs  Lab 01/19/22 0501 01/20/22 0517 01/20/22 1539 01/21/22 0530  AST 17 16   --  22  ALT 16 16  --  19  ALKPHOS 71 74  --  73  BILITOT 0.3 0.7  --  0.4  PROT 5.2* 5.6*  --  5.5*  ALBUMIN 2.3* 2.6* 2.9* 2.7*   No results for input(s): LIPASE, AMYLASE in the last 168 hours. No results for input(s): AMMONIA in the last 168 hours. CBC: Recent Labs  Lab 01/18/22 0411 01/18/22 0807 01/19/22 0752 01/20/22 0517 01/21/22 0530  WBC 27.2* 28.1* 29.3* 28.3* 28.5*  NEUTROABS  --  22.2* 24.2*  --   --   HGB 10.8* 11.6* 10.7* 10.0* 10.3*  HCT 32.7* 33.6* 31.6* 29.6* 30.1*  MCV 90.8 88.9 89.8 89.4 89.1  PLT 295 281 286 297 352   Cardiac Enzymes: No results for input(s): CKTOTAL, CKMB, CKMBINDEX, TROPONINI in the last 168 hours. CBG: Recent Labs  Lab 01/20/22 0623 01/20/22 1140 01/20/22 1622 01/20/22 2146 01/21/22 0612  GLUCAP 103* 108* 134* 111* 103*    Iron Studies: No results for input(s): IRON, TIBC, TRANSFERRIN, FERRITIN in the last 72 hours. Studies/Results: No results found.  (feeding supplement) PROSource Plus  30 mL Oral BID BM   amiodarone  200 mg Oral Daily   apixaban  2.5 mg Oral BID   aspirin  81 mg Oral Daily   atorvastatin  80 mg Oral Daily   Chlorhexidine Gluconate Cloth  6 each Topical Daily   famotidine  10 mg Oral Daily   fluticasone furoate-vilanterol  1 puff Inhalation Daily   guaiFENesin  600 mg Oral BID   insulin aspart  0-15 Units Subcutaneous TID WC   insulin aspart  0-5 Units Subcutaneous QHS   loratadine  10 mg Oral Daily   midodrine  5 mg Oral TID WC   montelukast  10 mg Oral QHS   multivitamin with minerals  1 tablet Oral Daily   nicotine  14 mg Transdermal Daily   polyethylene glycol  17 g Oral Daily   sodium chloride flush  10-40 mL Intracatheter Q12H   sodium chloride flush  3 mL Intravenous Q12H   sodium phosphate  1 enema Rectal Once   thiamine  100 mg Oral Daily    BMET    Component Value Date/Time   NA 132 (L) 01/21/2022 0530   K 4.2 01/21/2022 0530   CL 97 (L) 01/21/2022 0530   CO2 27 01/21/2022 0530    GLUCOSE 92 01/21/2022 0530   BUN 64 (H) 01/21/2022 0530   CREATININE 2.31 (H) 01/21/2022 0530   CALCIUM 8.7 (L) 01/21/2022 0530   GFRNONAA 20 (L) 01/21/2022 0530   GFRAA 76 (L) 08/24/2013 1222   CBC    Component Value Date/Time   WBC 28.5 (H) 01/21/2022 0530   RBC 3.38 (L) 01/21/2022 0530   HGB 10.3 (L) 01/21/2022 0530   HCT 30.1 (L) 01/21/2022 0530   PLT 352 01/21/2022 0530   MCV 89.1 01/21/2022 0530   MCH 30.5 01/21/2022 0530   MCHC 34.2 01/21/2022 0530   RDW 14.2 01/21/2022 0530   LYMPHSABS 2.8 01/19/2022 0752   MONOABS 0.8 01/19/2022 0752   EOSABS 0.6 (H) 01/19/2022 0752   BASOSABS 0.1 01/19/2022 0752     Assessment/Plan:  AKI/CKD stage IIIa - non-oliguric and multifactorial (cardiorenal in setting of acute systolic CHF exacerbation as well as CIN).  Required CRRT on 5/29-5/31/23.  Started on lasix and has had increased UOP and improving BUN/Cr, now down to 2.31.  Temp HD catheter removed 01/21/22.  No indication for dialysis at this time.  Will continue with current regimen and follow UOP and daily SCr.  Hypervolemic hyponatremia - improving with diuresis.  Will hold off on tolvaptan for now and follow. She is asymptomatic. STEMI - multivessel disease, high risk PCI later vs CABG.  Cardiology following.  Acute systolic CHF - EF 94-17% initially but now improved to 65-70% (suspect Takotsubo CMP). Paroxysmal atrial fibrillation - currently in NSR.  Amiodarone per Cardiology. Anemia of CKD stage IIIa - stable.  Continue to follow. Diffuse rash - consistent with drug reaction.  Cefepime stopped 01/16/22.  Donetta Potts, MD [facility]

## 2022-01-21 NOTE — TOC Initial Note (Addendum)
Transition of Care [facility]) - Initial/Assessment Note    Patient Details  Name: Brittany Jackson MRN: [id number] Date of Birth: 05-14-1941  Transition of Care [facility]) CM/SW Contact:    Erenest Rasher, RN Phone Number: [phone] 01/21/2022, 3:07 PM  Clinical Narrative:                  HF TOC CM spoke to pt and gave permission to speak to dtr, Colletta [place]. Spoke to [employer] and offered choice of HH (medicare.gov list with rating placed in chart). Dtr states she will get back to CM on choice. Dtr is requesting [employer] with seat, she will get a RW. States pt is independent. [place] with RW and 3n1 bedside commode.   Will have RT set up overnight pulse oximetry. Pt may need oxygen at night.   6/6 923 am Dtr requested [facility]. Referral to sent Alvis Lemmings rep, Tommi Rumps for [facility].    Expected Discharge Plan: [place] Barriers to Discharge: Continued Medical Work up   Patient Goals and CMS Choice Patient states their goals for this hospitalization and ongoing recovery are:: wants pt to get stronger so she can remain independent CMS Medicare.gov Compare Post Acute Care list provided to:: Patient Represenative (must comment) Clemon Chambers) Choice offered to / list presented to : Adult Children  Expected Discharge Plan and Services Expected Discharge Plan: [place]   Discharge Planning Services: CM Consult Post Acute Care Choice: [place] arrangements for the past 2 months: Single Family Home                 DME Arranged: 3-N-1, Walker rolling with seat DME Agency: [employer] Date DME Agency Contacted: 01/21/22 Time DME Agency Contacted: 35   HH Arranged: RN, PT, OT          Prior Living Arrangements/Services Living arrangements for the past 2 months: Single Family Home Lives with:: Self Patient language and need for interpreter reviewed:: Yes Do you feel safe going back to the place where you live?: Yes       Need for Family Participation in Patient Care: Yes (Comment) Care giver support system in place?: Yes (comment) Current home services: DME (bed rail) Criminal Activity/Legal Involvement Pertinent to Current Situation/Hospitalization: No - Comment as needed  Activities of Daily Living Home Assistive Devices/Equipment: None ADL Screening (condition at time of admission) Patient's cognitive ability adequate to safely complete daily activities?: Yes Is the patient deaf or have difficulty hearing?: Yes Does the patient have difficulty seeing, even when wearing glasses/contacts?: No Does the patient have difficulty concentrating, remembering, or making decisions?: No Patient able to express need for assistance with ADLs?: Yes Does the patient have difficulty dressing or bathing?: No Independently performs ADLs?: Yes (appropriate for developmental age) Does the patient have difficulty walking or climbing stairs?: No Weakness of Legs: None Weakness of Arms/Hands: None  Permission Sought/Granted Permission sought to share information with : Case Manager, Family Supports, PCP Permission granted to share information with : Yes, Verbal Permission Granted  Share Information with NAME: Girtha Kilgore  Permission granted to share info w AGENCY: [place] granted to share info w Relationship: daughter  Permission granted to share info w Contact Information: [phone]  Emotional Assessment Appearance:: Appears stated age Attitude/Demeanor/Rapport: Gracious Affect (typically observed): Accepting Orientation: : Oriented to Self, Oriented to Place, Oriented to  Time, Oriented to Situation   Psych Involvement: No (comment)  Admission  diagnosis:  ST elevation myocardial infarction (STEMI), unspecified artery (HCC) [I21.3] STEMI (ST elevation myocardial infarction) ([place]) [I21.3] Acute ST elevation myocardial infarction (STEMI) of anterolateral wall (HCC) [I21.09] Patient Active  Problem List   Diagnosis Date Noted   STEMI (ST elevation myocardial infarction) ([place]) 01/09/2022   Acute ST elevation myocardial infarction (STEMI) of anterolateral wall ([place]) 01/09/2022   Acute combined systolic and diastolic heart failure ([place]) 04/09/2012   HTN (hypertension) 04/09/2012   Tobacco abuse 04/09/2012   Headache(784.0) 04/09/2012   PCP:  Redmond School, MD Pharmacy:   [facility], [place] [address] Phone: [phone] Fax: [facility] [address] [address] Phone: [phone] Fax: [phone]     Social Determinants of Health (SDOH) Interventions    Readmission Risk Interventions     View : No data to display.

## 2022-01-21 NOTE — Progress Notes (Addendum)
Patient ID: MERCADIES CO, female   DOB: 1940-11-25, 81 y.o.   MRN: [id number]     Advanced Heart Failure Rounding Note  PCP-Cardiologist: None   Subjective:   Repeat echo 05/29: EF improved to 65-70%, RV okay  CVVH stopped 05/31, creatinine lower today at 2.3.   Yesterday diuresed with IV lasix. Brisk diuresis noted. Weight down another 10 pounds.   CO-OX 88%.    WBCs up to 28.5 with development of diffuse erythematous rash. Last dose cefepime 05/31.  CT chest  6/2 showed multifocal GGO and pleural effusions.  Seen by CCM, thought most likely drug rxn, less likely inflammatory dermatosis with inflammatory PNA. She was started on steroid course, now completed.  We also stopped po amiodarone (?rxn to dye in pill).   Denies SOB. Feels ok.   Objective:   Weight Range: 116.3 kg Body mass index is 41.38 kg/m.   Vital Signs:   Temp:  [97.6 F (36.4 C)-98.5 F (36.9 C)] 97.6 F (36.4 C) (06/05 0400) Pulse Rate:  [50-66] 53 (06/05 0600) Resp:  [12-22] 16 (06/05 0600) BP: (132-156)/(41-88) 139/45 (06/05 0600) SpO2:  [88 %-98 %] 96 % (06/05 0600) Weight:  [116.3 kg] 116.3 kg (06/05 0630) Last BM Date : 01/18/22  Weight change: Filed Weights   01/19/22 0500 01/20/22 0500 01/21/22 0630  Weight: 121.1 kg 121 kg 116.3 kg    Intake/Output:   Intake/Output Summary (Last 24 hours) at 01/21/2022 0724 Last data filed at 01/21/2022 0530 Gross per 24 hour  Intake 1823.28 ml  Output 4325 ml  Net -2501.72 ml    CVP 5-6   Physical Exam  General:  Sitting in the chair.  No resp difficulty HEENT: normal Neck: supple. JVP  5-6  . Carotids 2+ bilat; no bruits. No lymphadenopathy or thryomegaly appreciated. Cor: PMI nondisplaced. Regular rate & rhythm. No rubs, gallops or murmurs. Lungs: clear Abdomen: soft, nontender, nondistended. No hepatosplenomegaly. No bruits or masses. Good bowel sounds. Extremities: no cyanosis, clubbing, rash, R and LLE 2+ edema Neuro: alert & orientedx3, cranial  nerves grossly intact. moves all 4 extremities w/o difficulty. Affect pleasant  Telemetry   SR 50-60s personally checked.   Labs    CBC Recent Labs    01/18/22 0807 01/19/22 0752 01/20/22 0517 01/21/22 0530  WBC 28.1* 29.3* 28.3* 28.5*  NEUTROABS 22.2* 24.2*  --   --   HGB 11.6* 10.7* 10.0* 10.3*  HCT 33.6* 31.6* 29.6* 30.1*  MCV 88.9 89.8 89.4 89.1  PLT 281 286 297 397   Basic Metabolic Panel Recent Labs    01/20/22 0517 01/20/22 1539 01/21/22 0530  NA 128* 129* 132*  K 4.0 4.4 4.2  CL 94* 93* 97*  CO2 '25 24 27  '$ GLUCOSE 102* 126* 92  BUN 54* 63* 64*  CREATININE 2.51* 2.74* 2.31*  CALCIUM 8.7* 8.9 8.7*  MG 2.2  --  2.0  PHOS 3.6 3.6 4.1   Liver Function Tests Recent Labs    01/20/22 0517 01/20/22 1539 01/21/22 0530  AST 16  --  22  ALT 16  --  19  ALKPHOS 74  --  73  BILITOT 0.7  --  0.4  PROT 5.6*  --  5.5*  ALBUMIN 2.6* 2.9* 2.7*   No results for input(s): LIPASE, AMYLASE in the last 72 hours. Cardiac Enzymes No results for input(s): CKTOTAL, CKMB, CKMBINDEX, TROPONINI in the last 72 hours.  BNP: BNP (last 3 results) Recent Labs    01/08/22 2356  BNP  227.4*    ProBNP (last 3 results) No results for input(s): PROBNP in the last 8760 hours.   D-Dimer No results for input(s): DDIMER in the last 72 hours. Hemoglobin A1C No results for input(s): HGBA1C in the last 72 hours.  Fasting Lipid Panel No results for input(s): CHOL, HDL, LDLCALC, TRIG, CHOLHDL, LDLDIRECT in the last 72 hours.  Thyroid Function Tests No results for input(s): TSH, T4TOTAL, T3FREE, THYROIDAB in the last 72 hours.  Invalid input(s): FREET3   Other results:   Imaging    No results found.   Medications:     Scheduled Medications:  (feeding supplement) PROSource Plus  30 mL Oral BID BM   apixaban  2.5 mg Oral BID   aspirin  81 mg Oral Daily   atorvastatin  80 mg Oral Daily   Chlorhexidine Gluconate Cloth  6 each Topical Daily   famotidine  10 mg Oral  Daily   fluticasone furoate-vilanterol  1 puff Inhalation Daily   guaiFENesin  600 mg Oral BID   insulin aspart  0-15 Units Subcutaneous TID WC   insulin aspart  0-5 Units Subcutaneous QHS   loratadine  10 mg Oral Daily   midodrine  5 mg Oral TID WC   montelukast  10 mg Oral QHS   multivitamin with minerals  1 tablet Oral Daily   nicotine  14 mg Transdermal Daily   polyethylene glycol  17 g Oral Daily   sodium chloride flush  10-40 mL Intracatheter Q12H   sodium chloride flush  3 mL Intravenous Q12H   sodium phosphate  1 enema Rectal Once   thiamine  100 mg Oral Daily    Infusions:  sodium chloride Stopped (01/09/22 0046)   sodium chloride      PRN Medications: Place/Maintain arterial line **AND** sodium chloride, acetaminophen, albuterol, diazepam, hydrocortisone cream, hydrOXYzine, ondansetron (ZOFRAN) IV, oxyCODONE, pneumococcal 20-valent conjugate vaccine, sodium chloride, traMADol, zolpidem   Assessment/Plan   1. CAD: Patient presented with ACS. Peak HS-TnI 3557. LHC with 3VD, 50-60% dLM, ?90% ostial LAD, 75% stenosis at bifurcation of mLAD and D, 75% stenosis at bifurcation of mLCx and OM.  The ostial LAD is not completely laid out and on review, I am not sure that it is truly critical.  CABG would be a consideration if LAD disease is severe, may require FFR to determine this. PCI distal left main into LAD also an option if FFR shows severe disease.  For now, medical management given current clinical issues and recovery of LV systolic function.  No chest pain..  - ASA 81  - atorvastatin 80 daily - When she has stabilized medically and creatinine has come down, would favor repeat cath with FFR LM into LAD to assess significance of distal LM/ostial LAD disease. This may be a while in the future.  2. Acute systolic CHF: Patient has CAD, but not sure it explains the extent of her initial cardiomyopathy.  ?Stress (Takotsubo-type) CMP with pre-existing CAD.  Low output on initial RHC  with CI 1.8.  Filling pressures significantly elevated on RHC as well.  Echo initially with EF 20-25%, mildly decreased RV systolic function, trivial MR, dilated IVC. Repeat echo 05/29 with improvement in EF to 65-70% => suspect Takotsubo CMP.  Today, co-ox 88%   - CVP 5-6 . Still with lower extremity edema. Given another dose of IV lasix.  - Continue midodrine 5 TID. Has wide pulse pressure.  3.  Atrial fibrillation: Paroxysmal, noted initially this hospitalization.   -  Maintaining SR.  - Switched to Eliquis.  No procedures planned. Eventual cath as an outpatient.  - Has been on amiodarone IV as concern for drug rxn to dye in pill.  Will stop IV amiodarone this morning, see how she does off it.  4.  AKI: Creatinine up to 3.67. Suspect cardiorenal/ATN with low output + CIN.  With persistent hyponatremia not responding well to 3% saline and volume not significantly elevated to suggest benefit from tolvaptan, she was started CVVH on 05/29.  This was stopped on 5/31.  She would not want long-term HD.  Creatinine stable at 2.3, CVP down to 6 but has significant peripheral edema.   - Given another dose of IV lasix.  - Can remove HD catheter today.  5. ID: Covered for HAP, course of cefepime and vancomycin completed 05/31.  Afebrile but WBCs elevated.  Procalcitonin not impressive.  CT chest with GGO, ?resolving PNA.   - Reviewed with CCM, will not cover again with abx.  - Cultures NGTD.  6. Rash: Diffuse erythematous rash.  She has history of inflammatory dermatosis but she thinks this looks different.  ?drug eruption (recently stopped cefepime, ?dye from po amiodarone tab).  Looks somewhat better today, less pruritic.  Now on steroids.  - Prednisone course ordered => now completed.  7. COPD: Patient was a smoker up until admission.  Bedside PFTs 5/23 FEV 1 1.09L (53%) FVC 1.68 L (61%) FEF 25-75 0.54 (39%) - aggressive pulmonary toilet 8. Hyponatremia:  Intractable to the point of requiring CVVH.  Better  today at 132 9. Deconditioning - ambulate - Needs aggressive PT/OT  Transfer to progressive care.   Length of Stay: [place], NP  01/21/2022, 7:24 AM  Advanced Heart Failure Team Pager [phone] (M-F; 7a - 5p)  Please contact [facility] for night-coverage after hours (5p -7a ) and weekends on [email or web address]  Patient seen with NP, agree with the above note.   Good diuresis yesterday, CVP lower around 6.  Creatinine lower at 2.3.   General: NAD Neck: No JVD, no thyromegaly or thyroid nodule.  Lungs: Clear to auscultation bilaterally with normal respiratory effort. CV: Nondisplaced PMI.  Heart regular S1/S2, no S3/S4, no murmur.  1+ ankle edema.  Abdomen: Soft, nontender, no hepatosplenomegaly, no distention.  Skin: Erythematous diffuse rash.   Neurologic: Alert and oriented x 3.  Psych: Normal affect. Extremities: No clubbing or cyanosis.  HEENT: Normal.   Patient remains in NSR on apixaban.  Will restart amiodarone at  200 mg daily when we get pill with different dye (think dye on po amiodarone may have caused drug eruption).   Weight trending down, CVP lower, creatinine lower at 2.3.  Still with significant peripheral edema.  Will give 1 dose Lasix 60 mg IV today.   She has completed course of steroids for ?drug eruption. Rash still present but less pruritic.   Ok for transfer to step-down.   Loralie Champagne 01/21/2022 7:49 AM

## 2022-01-21 NOTE — Progress Notes (Signed)
Occupational Therapy Treatment and Discharge Patient Details Name: Brittany Jackson MRN: [id number] DOB: 1940-11-01 Today's Date: 01/21/2022   History of present illness Pt is an 81 y.o. female admitted 01/08/22 with STEMI, PNA, afib. S/p emergent RHC with multivessel disease. Per CTS, does not currently meet criteria for CABG/MVR; may consider high risk PCI vs repeat cath. Pt with AKI; CRRT initiated 5/29-5/31. PMH includes current smoker, COPD, CHF, HTN.   OT comments  This 81 yo is doing remarkably well since I last saw her on evaluation. She is now at a Mod I level with rollator for the mobility she did with me and for basic ADLs based on how she maneuvered about with me. No further acute OT needs, but would recommend HHOT to make sure she gets back to her baseline for basic and IADLs in her home. Acute OT will sign off.   Recommendations for follow up therapy are one component of a multi-disciplinary discharge planning process, led by the attending physician.  Recommendations may be updated based on patient status, additional functional criteria and insurance authorization.    Follow Up Recommendations  Home health OT    Assistance Recommended at Discharge PRN  Patient can return home with the following  Assistance with cooking/housework;Assist for transportation;Help with stairs or ramp for entrance   Equipment Recommendations  BSC/3in1       Precautions / Restrictions Restrictions Weight Bearing Restrictions: No       Mobility Bed Mobility               General bed mobility comments: pt up in recliner upon arrival    Transfers Overall transfer level: Modified independent Equipment used: Rollator (4 wheels) Transfers: Sit to/from Stand Sit to Stand: Independent           General transfer comment: Mod I walking around unit with rollator say hello and Jesus loves you to all she passed by     Balance Overall balance assessment: Mild deficits observed, not formally  tested             Standing balance comment: using rollator for ambulation                           ADL either performed or assessed with clinical judgement   ADL                                         General ADL Comments: overall at a S level from rollator level    Extremity/Trunk Assessment Upper Extremity Assessment Upper Extremity Assessment: Overall WFL for tasks assessed            Vision Patient Visual Report: No change from baseline            Cognition Arousal/Alertness: Awake/alert Behavior During Therapy: WFL for tasks assessed/performed Overall Cognitive Status: Within Functional Limits for tasks assessed                                                General Comments VSS on RA    Pertinent Vitals/ Pain       Pain Assessment Pain Assessment: No/denies pain            Progress Toward  Goals  OT Goals(current goals can now be found in the care plan section)  Progress towards OT goals: Goals met/education completed, patient discharged from OT  Acute Rehab OT Goals Patient Stated Goal: to go home  Plan Discharge plan needs to be updated       AM-PAC OT "6 Clicks" Daily Activity     Outcome Measure   Help from another person eating meals?: None Help from another person taking care of personal grooming?: None Help from another person toileting, which includes using toliet, bedpan, or urinal?: None Help from another person bathing (including washing, rinsing, drying)?: None Help from another person to put on and taking off regular upper body clothing?: None Help from another person to put on and taking off regular lower body clothing?: None 6 Click Score: 24    End of Session Equipment Utilized During Treatment: Rollator (4 wheels)  OT Visit Diagnosis: Other abnormalities of gait and mobility (R26.89)   Activity Tolerance Patient tolerated treatment well   Patient Left in chair;with call  bell/phone within reach   Nurse Communication  (RN saw up coming back to room from walk)        Time: 7564-3329 OT Time Calculation (min): 33 min  Charges: OT General Charges $OT Visit: 1 Visit OT Treatments $Self Care/Home Management : 23-37 mins  Brittany Jackson, Brittany Jackson [facility] [phone] Office [phone]    Brittany Jackson 01/21/2022, 11:43 AM

## 2022-01-21 NOTE — Telephone Encounter (Signed)
Filled out Paperwork for Eliquis Patient [place], [place] Patient Advocate Specialist [place] Patient Advocate Team Direct Number: [phone]  Fax: [phone]

## 2022-01-21 NOTE — Telephone Encounter (Signed)
Patient scheduled 04/23/2022 at 1:30pm with Dr. [place] Bells- appointment reminder mailed to address on file- nothing further needed.

## 2022-01-21 NOTE — Progress Notes (Signed)
CARDIAC REHAB PHASE I   PRE:  Rate/Rhythm: 76 NSR  BP:  Sitting: 131/44      SaO2: 96 RA  MODE:  Ambulation: 590 ft   POST:  Rate/Rhythm: 100 ST  BP:  Sitting: 120/77      SaO2: 94  Seen pt from 1332-1443, pt was ambulated through hallways with rollator. Did not complain of any chest pain or unusual symptoms during ambulation. pt was educated on restrictions, ex guidelines, NTG use, diet, and [facility]. Pt is being referred to [facility] in [place].    Christen Bame  2:40 PM 01/21/2022

## 2022-01-22 ENCOUNTER — Other Ambulatory Visit (HOSPITAL_COMMUNITY): Payer: Self-pay

## 2022-01-22 LAB — COMPREHENSIVE METABOLIC PANEL
ALT: 26 U/L (ref 0–44)
AST: 32 U/L (ref 15–41)
Albumin: 3 g/dL — ABNORMAL LOW (ref 3.5–5.0)
Alkaline Phosphatase: 76 U/L (ref 38–126)
Anion gap: 11 (ref 5–15)
BUN: 63 mg/dL — ABNORMAL HIGH (ref 8–23)
CO2: 27 mmol/L (ref 22–32)
Calcium: 8.7 mg/dL — ABNORMAL LOW (ref 8.9–10.3)
Chloride: 99 mmol/L (ref 98–111)
Creatinine, Ser: 2.04 mg/dL — ABNORMAL HIGH (ref 0.44–1.00)
GFR, Estimated: 24 mL/min — ABNORMAL LOW (ref >60)
Glucose, Bld: 73 mg/dL (ref 70–99)
Potassium: 4.2 mmol/L (ref 3.5–5.1)
Sodium: 137 mmol/L (ref 135–145)
Total Bilirubin: 0.7 mg/dL (ref 0.3–1.2)
Total Protein: 5.8 g/dL — ABNORMAL LOW (ref 6.5–8.1)

## 2022-01-22 LAB — CBC
HCT: 33.6 % — ABNORMAL LOW (ref 36.0–46.0)
Hemoglobin: 11.6 g/dL — ABNORMAL LOW (ref 12.0–15.0)
MCH: 31.1 pg (ref 26.0–34.0)
MCHC: 34.5 g/dL (ref 30.0–36.0)
MCV: 90.1 fL (ref 80.0–100.0)
Platelets: 441 K/uL — ABNORMAL HIGH (ref 150–400)
RBC: 3.73 MIL/uL — ABNORMAL LOW (ref 3.87–5.11)
RDW: 14.6 % (ref 11.5–15.5)
WBC: 27.6 K/uL — ABNORMAL HIGH (ref 4.0–10.5)
nRBC: 0.1 % (ref 0.0–0.2)

## 2022-01-22 LAB — COOXEMETRY PANEL
Carboxyhemoglobin: 1.4 % (ref 0.5–1.5)
Carboxyhemoglobin: 1.3 % (ref 0.5–1.5)
Methemoglobin: <0.7 % (ref 0.0–1.5)
Methemoglobin: <0.7 % (ref 0.0–1.5)
O2 Saturation: 47.9 %
O2 Saturation: 58 %
Total hemoglobin: 11.7 g/dL — ABNORMAL LOW (ref 12.0–16.0)
Total hemoglobin: 11 g/dL — ABNORMAL LOW (ref 12.0–16.0)

## 2022-01-22 LAB — PHOSPHORUS: Phosphorus: 3.9 mg/dL (ref 2.5–4.6)

## 2022-01-22 LAB — GLUCOSE, CAPILLARY
Glucose-Capillary: 83 mg/dL (ref 70–99)
Glucose-Capillary: 80 mg/dL (ref 70–99)
Glucose-Capillary: 105 mg/dL — ABNORMAL HIGH (ref 70–99)

## 2022-01-22 LAB — MAGNESIUM: Magnesium: 1.7 mg/dL (ref 1.7–2.4)

## 2022-01-22 MED ORDER — MIDODRINE HCL 2.5 MG PO TABS
2.5000 mg | ORAL_TABLET | Freq: Three times a day (TID) | ORAL | 1 refills | Status: DC
  Filled 2022-01-22: qty 90, 30d supply, fill #0

## 2022-01-22 MED ORDER — MIDODRINE HCL 5 MG PO TABS
2.5000 mg | ORAL_TABLET | Freq: Three times a day (TID) | ORAL | Status: DC
  Administered 2022-01-22: 2.5 mg via ORAL
  Filled 2022-01-22: qty 1

## 2022-01-22 MED ORDER — HYDROXYZINE HCL 25 MG PO TABS
25.0000 mg | ORAL_TABLET | Freq: Three times a day (TID) | ORAL | 0 refills | Status: DC | PRN
  Filled 2022-01-22: qty 30, 10d supply, fill #0

## 2022-01-22 MED ORDER — TORSEMIDE 20 MG PO TABS: 20.0000 mg | ORAL_TABLET | Freq: Every day | ORAL | Status: DC

## 2022-01-22 MED ORDER — TORSEMIDE 20 MG PO TABS
20.0000 mg | ORAL_TABLET | Freq: Every day | ORAL | 6 refills | Status: DC
  Filled 2022-01-22: qty 30, 30d supply, fill #0

## 2022-01-22 MED ORDER — AMIODARONE HCL 200 MG PO TABS
200.0000 mg | ORAL_TABLET | Freq: Every day | ORAL | 3 refills | Status: DC
  Filled 2022-01-22: qty 30, 30d supply, fill #0

## 2022-01-22 MED ORDER — APIXABAN 2.5 MG PO TABS
2.5000 mg | ORAL_TABLET | Freq: Two times a day (BID) | ORAL | 6 refills | Status: DC
Start: 2022-01-22 — End: 2022-09-25
  Filled 2022-01-22: qty 60, 30d supply, fill #0

## 2022-01-22 MED ORDER — ATORVASTATIN CALCIUM 80 MG PO TABS
80.0000 mg | ORAL_TABLET | Freq: Every day | ORAL | 6 refills | Status: DC
  Filled 2022-01-22: qty 30, 30d supply, fill #0

## 2022-01-22 MED ORDER — TORSEMIDE 20 MG PO TABS
40.0000 mg | ORAL_TABLET | Freq: Every day | ORAL | Status: DC
  Administered 2022-01-22: 40 mg via ORAL
  Filled 2022-01-22: qty 2

## 2022-01-22 MED ORDER — HYDROCORTISONE 1 % EX CREA
TOPICAL_CREAM | CUTANEOUS | 0 refills | Status: DC | PRN
  Filled 2022-01-22: qty 28, 14d supply, fill #0

## 2022-01-22 NOTE — Progress Notes (Addendum)
Patient ID: Brittany Jackson, female   DOB: 1941-02-10, 81 y.o.   MRN: [id number]     Advanced Heart Failure Rounding Note  PCP-Cardiologist: None   Subjective:   Repeat echo 05/29: EF improved to 65-70%, RV okay  CVVH stopped 05/31, creatinine lower today at 2.3.   Yesterday diuresed with IV lasix. I/O not accurate. Weight down 2 pounds. Creatinine down to 2 today.   CO-OX 48% => repeat 58%    WBCs up to 28.5 (today 27.6)  with development of diffuse erythematous rash. Last dose cefepime 05/31.  CT chest  6/2 showed multifocal GGO and pleural effusions.  Seen by CCM, thought most likely drug rxn, less likely inflammatory dermatosis with inflammatory PNA. She was started on steroid course, now completed.  We also stopped po amiodarone (?rxn to dye in pill).   Wants to go home. Complaining of equipment not working over night. Denies SOB.   Objective:   Weight Range: 115.4 kg Body mass index is 41.06 kg/m.   Vital Signs:   Temp:  [97.4 F (36.3 C)-98.1 F (36.7 C)] 97.6 F (36.4 C) (06/06 0332) Pulse Rate:  [55-78] 55 (06/06 0332) Resp:  [13-28] 18 (06/06 0332) BP: (124-131)/(38-51) 128/43 (06/06 0332) SpO2:  [92 %-97 %] 96 % (06/06 0332) Weight:  [115.4 kg] 115.4 kg (06/05 2354) Last BM Date : 01/21/22  Weight change: Filed Weights   01/20/22 0500 01/21/22 0630 01/21/22 2354  Weight: 121 kg 116.3 kg 115.4 kg    Intake/Output:   Intake/Output Summary (Last 24 hours) at 01/22/2022 0811 Last data filed at 01/21/2022 2200 Gross per 24 hour  Intake 900 ml  Output 1900 ml  Net -1000 ml     CVP 3-4  Physical Exam  General: Sitting in the chair. No resp difficulty HEENT: normal Neck: supple. no JVD. Carotids 2+ bilat; no bruits. No lymphadenopathy or thryomegaly appreciated. Cor: PMI nondisplaced. Regular rate & rhythm. No rubs, gallops or murmurs. Lungs: clear Abdomen: soft, nontender, nondistended. No hepatosplenomegaly. No bruits or masses. Good bowel  sounds. Extremities: no cyanosis, clubbing, rash, R and LLE 1+edema. RUE PICC  Neuro: alert & orientedx3, cranial nerves grossly intact. moves all 4 extremities w/o difficulty. Affect pleasant Skin : Red rash trunk/extremities.   Telemetry   SR 50-70s   Labs    CBC Recent Labs    01/21/22 0530 01/22/22 0659  WBC 28.5* 27.6*  HGB 10.3* 11.6*  HCT 30.1* 33.6*  MCV 89.1 90.1  PLT 352 154*   Basic Metabolic Panel Recent Labs    01/21/22 0530 01/22/22 0659  NA 132* 137  K 4.2 4.2  CL 97* 99  CO2 27 27  GLUCOSE 92 73  BUN 64* 63*  CREATININE 2.31* 2.04*  CALCIUM 8.7* 8.7*  MG 2.0 1.7  PHOS 4.1 3.9   Liver Function Tests Recent Labs    01/21/22 0530 01/22/22 0659  AST 22 32  ALT 19 26  ALKPHOS 73 76  BILITOT 0.4 0.7  PROT 5.5* 5.8*  ALBUMIN 2.7* 3.0*   No results for input(s): LIPASE, AMYLASE in the last 72 hours. Cardiac Enzymes No results for input(s): CKTOTAL, CKMB, CKMBINDEX, TROPONINI in the last 72 hours.  BNP: BNP (last 3 results) Recent Labs    01/08/22 2356  BNP 227.4*    ProBNP (last 3 results) No results for input(s): PROBNP in the last 8760 hours.   D-Dimer No results for input(s): DDIMER in the last 72 hours. Hemoglobin A1C No results for  input(s): HGBA1C in the last 72 hours.  Fasting Lipid Panel No results for input(s): CHOL, HDL, LDLCALC, TRIG, CHOLHDL, LDLDIRECT in the last 72 hours.  Thyroid Function Tests No results for input(s): TSH, T4TOTAL, T3FREE, THYROIDAB in the last 72 hours.  Invalid input(s): FREET3   Other results:   Imaging    No results found.   Medications:     Scheduled Medications:  (feeding supplement) PROSource Plus  30 mL Oral BID BM   amiodarone  200 mg Oral Daily   apixaban  2.5 mg Oral BID   aspirin  81 mg Oral Daily   atorvastatin  80 mg Oral Daily   Chlorhexidine Gluconate Cloth  6 each Topical Daily   famotidine  10 mg Oral Daily   insulin aspart  0-15 Units Subcutaneous TID WC    insulin aspart  0-5 Units Subcutaneous QHS   loratadine  10 mg Oral Daily   midodrine  5 mg Oral TID WC   montelukast  10 mg Oral QHS   multivitamin with minerals  1 tablet Oral Daily   nicotine  14 mg Transdermal Daily   polyethylene glycol  17 g Oral Daily   sodium chloride flush  10-40 mL Intracatheter Q12H   sodium chloride flush  3 mL Intravenous Q12H   sodium phosphate  1 enema Rectal Once   thiamine  100 mg Oral Daily    Infusions:  sodium chloride Stopped (01/09/22 0046)    PRN Medications: acetaminophen, albuterol, diazepam, hydrocortisone cream, hydrOXYzine, ondansetron (ZOFRAN) IV, oxyCODONE, pneumococcal 20-valent conjugate vaccine, sodium chloride, traMADol, zolpidem   Assessment/Plan   1. CAD: Patient presented with ACS. Peak HS-TnI 3557. LHC with 3VD, 50-60% dLM, ?90% ostial LAD, 75% stenosis at bifurcation of mLAD and D, 75% stenosis at bifurcation of mLCx and OM.  The ostial LAD is not completely laid out and on review, I am not sure that it is truly critical.  CABG would be a consideration if LAD disease is severe, may require FFR to determine this. PCI distal left main into LAD also an option if FFR shows severe disease.  For now, medical management given current clinical issues and recovery of LV systolic function.  No chest pain..  - ASA 81 => can stop at discharge.   - atorvastatin 80 daily - When she has stabilized medically and creatinine has come down, would favor repeat cath with FFR LM into LAD to assess significance of distal LM/ostial LAD disease. This may be a while in the future.  2. Acute systolic CHF: Patient has CAD, but not sure it explains the extent of her initial cardiomyopathy.  ?Stress (Takotsubo-type) CMP with pre-existing CAD.  Low output on initial RHC with CI 1.8.  Filling pressures significantly elevated on RHC as well.  Echo initially with EF 20-25%, mildly decreased RV systolic function, trivial MR, dilated IVC. Repeat echo 05/29 with  improvement in EF to 65-70% => suspect Takotsubo CMP.  Today, co-ox 48%, repeat.  - CVP 3-4 . Volume status stable. Start torsemide 40 mg daily  - Cut back midodrine to 2.5 mg TID. Has wide pulse pressure.  3.  Atrial fibrillation: Paroxysmal, noted initially this hospitalization.   - Maintaining SR.  - Continue eliquis.  No procedures planned. Eventual cath as an outpatient.  - Now on po amiodarone, using a differently-colored pill.  4.  AKI: Creatinine up to 3.67. Suspect cardiorenal/ATN with low output + CIN.  With persistent hyponatremia not responding well to 3% saline and  volume not significantly elevated to suggest benefit from tolvaptan, she was started CVVH on 05/29.  This was stopped on 5/31.  She would not want long-term HD.  Creatinine stable at 2.03.   CVP 3-4  5. ID: Covered for HAP, course of cefepime and vancomycin completed 05/31.  Afebrile but WBCs elevated.  Procalcitonin not impressive.  CT chest with GGO, ?resolving PNA.   - Reviewed with CCM, will not cover again with abx.  - Cultures NGTD.  6. Rash: Diffuse erythematous rash.  She has history of inflammatory dermatosis but she thinks this looks different.  ?drug eruption (recently stopped cefepime, ?dye from po amiodarone tab).  Looks somewhat better today, less pruritic.  Prednisone course ordered => now completed.  7. COPD: Patient was a smoker up until admission.  Bedside PFTs 5/23 FEV 1 1.09L (53%) FVC 1.68 L (61%) FEF 25-75 0.54 (39%) - aggressive pulmonary toilet 8. Hyponatremia:  Intractable to the point of requiring CVVH.  Better today at 137 9. Deconditioning - ambulate - Needs aggressive PT/OT--> HH orderes.     Length of Stay: [place], NP  01/22/2022, 8:11 AM  Advanced Heart Failure Team Pager [phone] (M-F; 7a - 5p)  Please contact [facility] for night-coverage after hours (5p -7a ) and weekends on [email or web address]  Patient seen with NP, agree with the above note.   Creatinine down to 2.04, CVP  down to 4.  Weight down 2 lbs.  She feels good today, no complaints. She remains in NSR.   General: NAD Neck: No JVD, no thyromegaly or thyroid nodule.  Lungs: Clear to auscultation bilaterally with normal respiratory effort. CV: Nondisplaced PMI.  Heart regular S1/S2, no S3/S4, no murmur.  1+ ankle edema.  Abdomen: Soft, nontender, no hepatosplenomegaly, no distention.  Skin: Diffuse erythematous rash.  Neurologic: Alert and oriented x 3.  Psych: Normal affect. Extremities: No clubbing or cyanosis.  HEENT: Normal.   I think patient is stable for home today with home health/PT.   She will continue on amiodarone and apixaban for atrial fibrillation, using a different tablet in case dye from initial amiodarone tab caused drug eruption.  She will stop ASA given apixaban use.   Volume status is ok, would use torsemide 20 mg daily for home.  Sodium is now in normal range.   Creatinine is slowly trending down.  Will followup as outpatient, when creatinine has stabilized at baseline, would arrange FFR LM/LAD to determine need for intervention.   She will go home today with home health, followup in HF clinic in 7-10 days.  Meds for discharge: torsemide 20 mg daily, midodrine 2.5 mg tid (hopefully stop as outpatient), atorvastatin 80 mg daily, apixaban 2.5 mg bid (age, creatinine), amiodarone 200 mg daily (need correct type of tablet given ?allergic reaction).   Loralie Champagne 01/22/2022 10:11 AM

## 2022-01-22 NOTE — Discharge Instructions (Signed)

## 2022-01-22 NOTE — Consult Note (Addendum)
   [facility] Inpatient Consult   01/22/2022  ARYN SAFRAN 11-13-1940 [id number]  [place]  Accountable Care Organization [ACO] Patient: Medicare ACO REACH  Referral: HF inpatient [facility] RNCM  Primary Care Provider:  Redmond School, MD, [facility] is an [place] provider with a chronic care management team available.  Patient was noted as medium risk for unplanned readmission and plan to go home with daughter per HF TOC RNCM. Explained that patient could be referred for Care Coordination or Chronic Care Management with the [facility] team that is currently followed by [facility]. Briefly reviewed for potential needs. SDOH reviewed in Epic.   Met with patient at bedside, personal care with plans for going home.  Patient endorses Dr. Gerarda Fraction as PCP as she states she has had him for years. Spoke with her regarding her annual wellness visit [AWV]. She expresses how that visit has changed a lot over the years however she does have an appointment she thinks in August.  Gave her a reminder card with 24 hour nurse line information.  She was gracious and expressed to care coordination or CCM needs at this time.  Addendum:  Patient states she will follow up with the cardiologist in [facility] and for the medications, she has forms she will return for the medication assistance.  For questions, please contact:  Natividad Brood, RN BSN [facility] Liaison  [phone] business mobile phone Toll free office [phone]  Fax number: [phone] [email or web address]

## 2022-01-22 NOTE — Care Management Important Message (Signed)
Important Message  Patient Details  Name: Brittany Jackson MRN: [id number] Date of Birth: 09/16/1940   Medicare Important Message Given:  Yes     Shelda Altes 01/22/2022, 8:24 AM

## 2022-01-22 NOTE — TOC Progression Note (Addendum)
Transition of Care [facility]) - Progression Note    Patient Details  Name: Brittany Jackson MRN: [id number] Date of Birth: May 07, 1941  Transition of Care [facility]) CM/SW Contact  Erenest Rasher, RN Phone Number: [phone] 01/22/2022, 11:43 AM  Clinical Narrative:     HF TOC CM spoke to pt and pt's dtr. Pt has declined 3n1 bedside commode and states it is too small. Will have [place] pick up DME and credit back to pt acct. Pt will need a bariatric size commode. PT will evaluate pt for stairs and complete 6 min walk test for oxygen. Pt did not require oxygen last night.   Pt requested [place] pick up bedside commode. She did not meet weight requirement for bariatric bsc. Rollator in room. Made dtr aware. Oxygen not needed for home. Dtr states she will be here around 430 pm to pick up pt.   Expected Discharge Plan: [place] Barriers to Discharge: Continued Medical Work up  Expected Discharge Plan and Services Expected Discharge Plan: [place]   Discharge Planning Services: CM Consult Post Acute Care Choice: [place] arrangements for the past 2 months: Single Family Home Expected Discharge Date: 01/22/22               DME Arranged: Berta Minor rolling with seat DME Agency: [employer] Date DME Agency Contacted: 01/21/22 Time DME Agency Contacted: [phone]   HH Arranged: RN, PT, OT    Social Determinants of Health (SDOH) Interventions Food Insecurity Interventions: Intervention Not Indicated Housing Interventions: Intervention Not Indicated Transportation Interventions: Patient Resources (Friends/Family)  Readmission Risk Interventions     View : No data to display.

## 2022-01-22 NOTE — Discharge Summary (Signed)
Advanced Heart Failure Team  Discharge Summary   Patient ID: Brittany Jackson MRN: [id number], DOB/AGE: May 25, 1941 81 y.o. Admit date: 01/08/2022 D/C date:     01/22/2022   Primary Discharge Diagnoses:  1. CAD: Patient presented with ACS. 2. Acute systolic CHF 3.  Atrial fibrillation: Paroxysmal, noted initially this hospitalization.   4.  AKI on CKD Stage IIIa 5. ID 6. Rash: Diffuse erythematous rash.    7. COPD 8. Hyponatremia 9. Deconditioning    Hospital Course:  81 y.o. female with history of chronic diastolic CHF, tobacco use, HTN, COPD. Admitted with inferolateral STEMI c/b acute systolic CHF with low-output. Plan to manage CAD for now medically. Hospital course c/b AKI on CKD Stage IIIa, HAP, and leukocytosis. Briefly on CVVHD and had gradual improvement. Creatinine at d/c down to 2.0/.  Developed rash felt to be in the setting of amiodarone. Treated with steroids and switched to amio without dye. Will need to reassess at her follow up.   See below for detailed problems list. He will contine to be followed closely in the HF clinic. She will continue to be followed closely in the HF clinic.   Consultants CCM- F/U with Dr [facility] - F/U in the community  Urology  CT Surgery  Palliative Care     1. CAD: Patient presented with ACS. Peak HS-TnI 3557. LHC with 3VD, 50-60% dLM, ?90% ostial LAD, 75% stenosis at bifurcation of mLAD and D, 75% stenosis at bifurcation of mLCx and OM.  The ostial LAD is not completely laid out and on review, not sure that it is truly critical.  CABG would be a consideration if LAD disease is severe, may require FFR to determine this. PCI distal left main into LAD also an option if FFR shows severe disease.  For now, medical management given current clinical issues and recovery of LV systolic function.  No chest pain..  - ASA 81 => can stop at discharge. On Eliquis.    - atorvastatin 80 daily - When she has stabilized medically and creatinine  has come down, would favor repeat cath with FFR LM into LAD to assess significance of distal LM/ostial LAD disease. This may be a while in the future.  2. Acute systolic CHF: Patient has CAD, but not sure it explains the extent of her initial cardiomyopathy.  ?Stress (Takotsubo-type) CMP with pre-existing CAD.  Low output on initial RHC with CI 1.8.  Filling pressures significantly elevated on RHC as well.  Echo initially with EF 20-25%, mildly decreased RV systolic function, trivial MR, dilated IVC. Repeat echo 05/29 with improvement in EF to 65-70% => suspect Takotsubo CMP.  Volume status stable. Starting torsemide 40 mg daily  - Continue midodrine to 2.5 mg TID. And will hopefully stop at discharge.   3.  Atrial fibrillation: Paroxysmal, noted initially this hospitalization.   - Maintaining SR.  - Continue eliquis.  Pharmacy assisted with amio pill without the dye. Now on po amiodarone, using a differently-colored pill.  4.  AKI: Creatinine up to 3.67. Suspect cardiorenal/ATN with low output + CIN.  With persistent hyponatremia not responding well to 3% saline and volume not significantly elevated to suggest benefit from tolvaptan, she was started CVVH on 05/29.  This was stopped on 5/31.  She would not want long-term HD.  Creatinine stable at 2.03.   CVP 3-4  5. ID: Covered for HAP, course of cefepime and vancomycin completed 05/31.  Afebrile but WBCs elevated.  Procalcitonin not impressive.  CT  chest with GGO, ?resolving PNA.   - Reviewed with CCM, will not cover again with abx.  - Cultures NGTD.  6. Rash: Diffuse erythematous rash.  She has history of inflammatory dermatosis but she thinks this looks different.  ?drug eruption (recently stopped cefepime, ?dye from po amiodarone tab).  Looks somewhat better today, less pruritic.  Prednisone course ordered => now completed.  7. COPD: Patient was a smoker up until admission.  Bedside PFTs 5/23 FEV 1 1.09L (53%) FVC 1.68 L (61%) FEF 25-75 0.54 (39%) -  aggressive pulmonary toilet 8. Hyponatremia:  Intractable to the point of requiring CVVH.  Better today at 137 9. Deconditioning - ambulate - Needs aggressive PT/OT--> HH orderes.  Discharge Weight: 254.4 pounds  Discharge Vitals: Blood pressure (!) 129/42, pulse 67, temperature 98.2 F (36.8 C), temperature source Oral, resp. rate 19, height '5\' 6"'$  (1.676 m), weight 115.4 kg, SpO2 99 %.  Labs: Lab Results  Component Value Date   WBC 27.6 (H) 01/22/2022   HGB 11.6 (L) 01/22/2022   HCT 33.6 (L) 01/22/2022   MCV 90.1 01/22/2022   PLT 441 (H) 01/22/2022    Recent Labs  Lab 01/22/22 0659  NA 137  K 4.2  CL 99  CO2 27  BUN 63*  CREATININE 2.04*  CALCIUM 8.7*  PROT 5.8*  BILITOT 0.7  ALKPHOS 76  ALT 26  AST 32  GLUCOSE 73   Lab Results  Component Value Date   CHOL 268 (H) 01/09/2022   HDL 74 01/09/2022   LDLCALC 164 (H) 01/09/2022   TRIG 148 01/09/2022   BNP (last 3 results) Recent Labs    01/08/22 2356  BNP 227.4*    ProBNP (last 3 results) No results for input(s): PROBNP in the last 8760 hours.   Diagnostic Studies/Procedures   No results found.  Discharge Medications   Allergies as of 01/22/2022       Reactions   Codeine Other (See Comments)   Unknown   Meprobamate Swelling   Sulfa Antibiotics Other (See Comments)   Unknown        Medication List     STOP taking these medications    allopurinol 100 MG tablet Commonly known as: ZYLOPRIM   aspirin EC 81 MG tablet   methocarbamol 500 MG tablet Commonly known as: ROBAXIN   ramipril 10 MG capsule Commonly known as: ALTACE       TAKE these medications    albuterol 4 MG tablet Commonly known as: PROVENTIL Take 2 mg by mouth 3 (three) times daily.   amiodarone 200 MG tablet Commonly known as: Pacerone Take 1 tablet (200 mg total) by mouth daily.   apixaban 2.5 MG Tabs tablet Commonly known as: ELIQUIS Take 1 tablet (2.5 mg total) by mouth 2 (two) times daily.   atorvastatin 80  MG tablet Commonly known as: LIPITOR Take 1 tablet (80 mg total) by mouth daily. Start taking on: January 23, 2022   b complex vitamins capsule Take 1 capsule by mouth daily.   cetirizine 10 MG tablet Commonly known as: ZYRTEC Take 1-2 tablets 2 times per day maximum. What changed:  how much to take how to take this when to take this reasons to take this additional instructions   diazepam 10 MG tablet Commonly known as: VALIUM Take 10 mg by mouth 3 (three) times daily as needed for anxiety.   famotidine 40 MG tablet Commonly known as: PEPCID Take 1 tablet (40 mg total) by mouth 2 (two) times  daily. What changed:  when to take this reasons to take this Another medication with the same name was removed. Continue taking this medication, and follow the directions you see here.   hydrocortisone cream 1 % Apply topically as needed for itching.   hydrOXYzine 25 MG tablet Commonly known as: ATARAX Take 1 tablet (25 mg total) by mouth 3 (three) times daily as needed for itching.   midodrine 2.5 MG tablet Commonly known as: PROAMATINE Take 1 tablet (2.5 mg total) by mouth 3 (three) times daily with meals.   montelukast 10 MG tablet Commonly known as: SINGULAIR Take 1 tablet (10 mg total) by mouth at bedtime.   Multi For Her 50+ Tabs Take 1 tablet by mouth daily.   Red Yeast Rice 600 MG Caps Take 600 mg by mouth daily.   torsemide 20 MG tablet Commonly known as: DEMADEX Take 1 tablet (20 mg total) by mouth daily. Start taking on: January 23, 2022   vitamin C 1000 MG tablet Take 1,000 mg by mouth daily.   zinc gluconate 50 MG tablet Take 50 mg by mouth daily.               Durable Medical Equipment  (From admission, onward)           Start     Ordered   01/21/22 1510  Heart failure home health orders  (Heart failure home health orders / Face to face)  Once       Comments: Heart Failure Follow-up Care:  Verify follow-up appointments per Patient Discharge  Instructions. Confirm transportation arranged. Reconcile home medications with discharge medication list. Remove discontinued medications from use. Assist patient/caregiver to manage medications using pill box. Reinforce low sodium food selection Assessments: Vital signs and oxygen saturation at each visit. Assess home environment for safety concerns, caregiver support and availability of low-sodium foods. Consult Education officer, museum, PT/OT, Dietitian, and CNA based on assessments. Perform comprehensive cardiopulmonary assessment. Notify MD for any change in condition or weight gain of 3 pounds in one day or 5 pounds in one week with symptoms. Daily Weights and Symptom Monitoring:   [place]  Ensure patient has access to scales. Teach patient/caregiver to weigh daily before breakfast and after voiding using same scale and record.    Teach patient/caregiver to track weight and symptoms and when to notify Provider. Activity: Develop individualized activity plan with patient/caregiver.  Question Answer Comment  Heart Failure Follow-up Care Advanced Heart Failure (AHF) Clinic at [phone]   Lab frequency Other see comments   Fax lab results to AHF Clinic at [phone]   Fax lab results to Other see comments   Diet Low Sodium Heart Healthy   Fluid restrictions: 1800 mL Fluid      01/21/22 1510   01/21/22 1506  For home use only DME 3 n 1  Once        01/21/22 1506   01/21/22 1506  For home use only DME 4 wheeled rolling walker with seat  Once       Question:  Patient needs a walker to treat with the following condition  Answer:  HF (heart failure), systolic ([place])   79/02/40 1506            Disposition   The patient will be discharged in stable condition to home. Discharge Instructions     (HEART FAILURE PATIENTS) Call MD:  Anytime you have any of the following symptoms: 1) 3 pound weight gain in 24 hours or  5 pounds in 1 week 2) shortness of breath, with or without a dry  hacking cough 3) swelling in the hands, feet or stomach 4) if you have to sleep on extra pillows at night in order to breathe.   Complete by: As directed    Amb Referral to Cardiac Rehabilitation   Complete by: As directed    Diagnosis: STEMI   After initial evaluation and assessments completed: Virtual Based Care may be provided alone or in conjunction with Phase 2 Cardiac Rehab based on patient barriers.: Yes   Diet - low sodium heart healthy   Complete by: As directed    Heart Failure patients record your daily weight using the same scale at the same time of day   Complete by: As directed    Increase activity slowly   Complete by: As directed        Follow-up Information     [facility] Follow up on 01/31/2022.   Specialty: Cardiology Why: at 3:30. Contact information: [address] [id number] [place] [phone]                  Duration of Discharge Encounter: Greater than 35 minutes   Signed, Darrick Grinder NP-C  01/22/2022, 11:12 AM

## 2022-01-22 NOTE — Progress Notes (Signed)
Physical Therapy Treatment Patient Details Name: TOPAZ RAGLIN MRN: [id number] DOB: 25-Jul-1941 Today's Date: 01/22/2022   History of Present Illness Pt is an 81 y.o. female admitted 01/08/22 with STEMI, PNA, afib. S/p emergent RHC with multivessel disease. Per CTS, does not currently meet criteria for CABG/MVR; may consider high risk PCI vs repeat cath. Pt with AKI; CRRT initiated 5/29-5/31. PMH includes current smoker, COPD, CHF, HTN.   PT Comments    Pt progressing with mobility. Pt mod indep ambulating with rollator; pt mod indep ascending/descending stairs with rail support. Pt focused on d/c home today. Reviewed education, to which pt reports no further questions or concerns. Pt has met short-term acute PT goals, will d/c PT.  SpO2 92-98% on RA HR 91    Recommendations for follow up therapy are one component of a multi-disciplinary discharge planning process, led by the attending physician.  Recommendations may be updated based on patient status, additional functional criteria and insurance authorization.  Follow Up Recommendations  Home health PT     Assistance Recommended at Discharge Intermittent Supervision/Assistance  Patient can return home with the following A little help with bathing/dressing/bathroom;Assistance with cooking/housework;Assist for transportation   Equipment Recommendations  Rollator (4 wheels) - already delivered   Recommendations for Other Services  N/A     Precautions / Restrictions Precautions Precautions: Fall Restrictions Weight Bearing Restrictions: No     Mobility  Bed Mobility Overal bed mobility: Modified Independent Bed Mobility: Supine to Sit, Sit to Supine           General bed mobility comments: HOB elevated    Transfers Overall transfer level: Modified independent Equipment used: Rollator (4 wheels) Transfers: Sit to/from Stand                  Ambulation/Gait Ambulation/Gait assistance: Modified independent  (Device/Increase time) Gait Distance (Feet): 220 Feet Assistive device: Rollator (4 wheels) Gait Pattern/deviations: Step-through pattern, Decreased stride length, Trunk flexed       General Gait Details: slow, steady gait mod indep with rollator; pt declined practicing seated rest on rollator, declined further ambulation distance. SpO2 >/92% on RA   Stairs Stairs: Yes Stairs assistance: Modified independent (Device/Increase time) Stair Management: One rail Left, Step to pattern, Sideways Number of Stairs: 7 General stair comments: ascend/descend 7 steps with BUE support on L-side rail, pt performing step-to pattern - reports this is her baseline technique due to "bad knees"; mod indep   Wheelchair Mobility    Modified Rankin (Stroke Patients Only)       Balance Overall balance assessment: Needs assistance Sitting-balance support: No upper extremity supported, Feet supported Sitting balance-Leahy Scale: Good     Standing balance support: During functional activity, No upper extremity supported Standing balance-Leahy Scale: Fair Standing balance comment: can stand and take steps without UE support; standing balance not formally challenged                            Cognition Arousal/Alertness: Awake/alert Behavior During Therapy: WFL for tasks assessed/performed Overall Cognitive Status: No family/caregiver present to determine baseline cognitive functioning Area of Impairment: Attention, Safety/judgement                   Current Attention Level: Selective   Following Commands: Follows multi-step commands inconsistently       General Comments: WFL for majority of simple tasks; pt self distracts with apparent decreased short-term memory based on pt's repetitive conversation, but able  to be redirect. likely baseline cognition. education limited as pt focused on wanting to d/c and "I don't have any damn clothes... get these damn things (IV) out of my  arm so I can rest"        Exercises      General Comments General comments (skin integrity, edema, etc.): SpO2 92-98% on RA, HR 91      Pertinent Vitals/Pain Pain Assessment Pain Assessment: No/denies pain Pain Intervention(s): Monitored during session    Home Living                          Prior Function            PT Goals (current goals can now be found in the care plan section) Progress towards PT goals: Goals met/education completed, patient discharged from PT    Frequency    Min 3X/week      PT Plan Current plan remains appropriate    Co-evaluation              AM-PAC PT "6 Clicks" Mobility   Outcome Measure  Help needed turning from your back to your side while in a flat bed without using bedrails?: None Help needed moving from lying on your back to sitting on the side of a flat bed without using bedrails?: None Help needed moving to and from a bed to a chair (including a wheelchair)?: None Help needed standing up from a chair using your arms (e.g., wheelchair or bedside chair)?: None Help needed to walk in hospital room?: None Help needed climbing 3-5 steps with a railing? : None 6 Click Score: 24    End of Session   Activity Tolerance: Patient tolerated treatment well Patient left: in bed;with call bell/phone within reach Nurse Communication: Mobility status PT Visit Diagnosis: Unsteadiness on feet (R26.81);Muscle weakness (generalized) (M62.81);Other abnormalities of gait and mobility (R26.89)     Time: 4132-4401 PT Time Calculation (min) (ACUTE ONLY): 14 min  Charges:  $Gait Training: 8-22 mins                     Mabeline Caras, PT, DPT Acute Rehabilitation Services  Pager [phone] Office [place] 01/22/2022, 2:39 PM

## 2022-01-22 NOTE — Progress Notes (Signed)
Patient ID: Brittany Jackson, female   DOB: 13-Sep-1940, 81 y.o.   MRN: [id number] S:Feeling better and wants to go home O:BP (!) 129/42 (BP Location: Right Arm)   Pulse 67   Temp 98.2 F (36.8 C) (Oral)   Resp 19   Ht '5\' 6"'$  (1.676 m)   Wt 115.4 kg   SpO2 99%   BMI 41.06 kg/m   Intake/Output Summary (Last 24 hours) at 01/22/2022 1106 Last data filed at 01/21/2022 2200 Gross per 24 hour  Intake 660 ml  Output 900 ml  Net -240 ml   Intake/Output: I/O last 3 completed shifts: In: 1440 [P.O.:1410; I.V.:30] Out: 2925 [Urine:2925]  Intake/Output this shift:  No intake/output data recorded. Weight change: -0.905 kg Gen:NAD CVS: RRR  Resp:CTA Abd:+BS, soft, NT/ND Ext: trace to 1+ bilateral pretibial edema.   Recent Labs  Lab 01/16/22 0358 01/16/22 1636 01/17/22 0446 01/18/22 0411 01/18/22 1753 01/19/22 0501 01/19/22 1638 01/20/22 0517 01/20/22 1539 01/21/22 0530 01/22/22 0659  NA 131*   < > 130* 130* 127* 126* 126* 128* 129* 132* 137  K 3.6   < > 3.8 4.4 4.5 4.7 4.5 4.0 4.4 4.2 4.2  CL 100   < > 99 98 95* 94* 94* 94* 93* 97* 99  CO2 23   < > '24 26 23 23 23 25 24 27 27  '$ GLUCOSE 115*   < > 97 78 101* 225* 135* 102* 126* 92 73  BUN 22   < > 27* 34* 42* 44* 50* 54* 63* 64* 63*  CREATININE 1.44*   < > 2.29* 2.56* 2.63* 2.48* 2.54* 2.51* 2.74* 2.31* 2.04*  ALBUMIN 2.4*   < > 2.4* 2.2* 2.1* 2.3*  --  2.6* 2.9* 2.7* 3.0*  CALCIUM 7.6*   < > 8.1* 8.2* 8.0* 8.2* 8.7* 8.7* 8.9 8.7* 8.7*  PHOS  --    < >  --  3.0 2.9 3.1  --  3.6 3.6 4.1 3.9  AST 20  --  20 17  --  17  --  16  --  22 32  ALT 14  --  15 14  --  16  --  16  --  19 26   < > = values in this interval not displayed.   Liver Function Tests: Recent Labs  Lab 01/20/22 0517 01/20/22 1539 01/21/22 0530 01/22/22 0659  AST 16  --  22 32  ALT 16  --  19 26  ALKPHOS 74  --  73 76  BILITOT 0.7  --  0.4 0.7  PROT 5.6*  --  5.5* 5.8*  ALBUMIN 2.6* 2.9* 2.7* 3.0*   No results for input(s): LIPASE, AMYLASE in the last 168  hours. No results for input(s): AMMONIA in the last 168 hours. CBC: Recent Labs  Lab 01/18/22 0807 01/19/22 0752 01/20/22 0517 01/21/22 0530 01/22/22 0659  WBC 28.1* 29.3* 28.3* 28.5* 27.6*  NEUTROABS 22.2* 24.2*  --   --   --   HGB 11.6* 10.7* 10.0* 10.3* 11.6*  HCT 33.6* 31.6* 29.6* 30.1* 33.6*  MCV 88.9 89.8 89.4 89.1 90.1  PLT 281 286 297 352 441*   Cardiac Enzymes: No results for input(s): CKTOTAL, CKMB, CKMBINDEX, TROPONINI in the last 168 hours. CBG: Recent Labs  Lab 01/21/22 0612 01/21/22 1126 01/21/22 1616 01/21/22 2142 01/22/22 0652  GLUCAP 103* 71 86 93 83    Iron Studies: No results for input(s): IRON, TIBC, TRANSFERRIN, FERRITIN in the last 72 hours. Studies/Results: No  results found.  (feeding supplement) PROSource Plus  30 mL Oral BID BM   amiodarone  200 mg Oral Daily   apixaban  2.5 mg Oral BID   atorvastatin  80 mg Oral Daily   Chlorhexidine Gluconate Cloth  6 each Topical Daily   famotidine  10 mg Oral Daily   insulin aspart  0-15 Units Subcutaneous TID WC   insulin aspart  0-5 Units Subcutaneous QHS   loratadine  10 mg Oral Daily   midodrine  2.5 mg Oral TID WC   montelukast  10 mg Oral QHS   multivitamin with minerals  1 tablet Oral Daily   nicotine  14 mg Transdermal Daily   polyethylene glycol  17 g Oral Daily   sodium chloride flush  10-40 mL Intracatheter Q12H   sodium chloride flush  3 mL Intravenous Q12H   sodium phosphate  1 enema Rectal Once   thiamine  100 mg Oral Daily   [START ON 01/23/2022] torsemide  20 mg Oral Daily    BMET    Component Value Date/Time   NA 137 01/22/2022 0659   K 4.2 01/22/2022 0659   CL 99 01/22/2022 0659   CO2 27 01/22/2022 0659   GLUCOSE 73 01/22/2022 0659   BUN 63 (H) 01/22/2022 0659   CREATININE 2.04 (H) 01/22/2022 0659   CALCIUM 8.7 (L) 01/22/2022 0659   GFRNONAA 24 (L) 01/22/2022 0659   GFRAA 76 (L) 08/24/2013 1222   CBC    Component Value Date/Time   WBC 27.6 (H) 01/22/2022 0659   RBC  3.73 (L) 01/22/2022 0659   HGB 11.6 (L) 01/22/2022 0659   HCT 33.6 (L) 01/22/2022 0659   PLT 441 (H) 01/22/2022 0659   MCV 90.1 01/22/2022 0659   MCH 31.1 01/22/2022 0659   MCHC 34.5 01/22/2022 0659   RDW 14.6 01/22/2022 0659   LYMPHSABS 2.8 01/19/2022 0752   MONOABS 0.8 01/19/2022 0752   EOSABS 0.6 (H) 01/19/2022 0752   BASOSABS 0.1 01/19/2022 0752    Assessment/Plan:   AKI/CKD stage IIIa - non-oliguric and multifactorial (cardiorenal in setting of acute systolic CHF exacerbation as well as CIN).  Required CRRT on 5/29-5/31/23.  Started on lasix and has had increased UOP and improving BUN/Cr, now down to 2.31.  Temp HD catheter removed 01/21/22.  No indication for dialysis at this time.  BUN/Cr improving.  Will sign off and arrange for outpatient follow up with our office in the next 2-3 weeks (if she agrees to go).  Hypervolemic hyponatremia - improving with diuresis.  Will hold off on tolvaptan for now and follow. She is asymptomatic. STEMI - multivessel disease, high risk PCI later vs CABG.  Cardiology following.  Acute systolic CHF - EF 74-08% initially but now improved to 65-70% (suspect Takotsubo CMP). Paroxysmal atrial fibrillation - currently in NSR.  Amiodarone per Cardiology. Anemia of CKD stage IIIa - stable.  Continue to follow. Diffuse rash - consistent with drug reaction.  Cefepime stopped 01/16/22.  Donetta Potts, MD [facility]

## 2022-01-23 ENCOUNTER — Other Ambulatory Visit (HOSPITAL_COMMUNITY): Payer: Self-pay

## 2022-01-23 LAB — CULTURE, BLOOD (ROUTINE X 2)
Culture: NO GROWTH
Culture: NO GROWTH
Special Requests: ADEQUATE

## 2022-01-23 LAB — EXPECTORATED SPUTUM ASSESSMENT W GRAM STAIN, RFLX TO RESP C

## 2022-01-24 ENCOUNTER — Telehealth (HOSPITAL_COMMUNITY): Payer: Self-pay | Admitting: Pharmacy Technician

## 2022-01-24 ENCOUNTER — Other Ambulatory Visit (HOSPITAL_COMMUNITY): Payer: Self-pay

## 2022-01-24 DIAGNOSIS — Z9181 History of falling: Secondary | ICD-10-CM | POA: Diagnosis not present

## 2022-01-24 DIAGNOSIS — E871 Hypo-osmolality and hyponatremia: Secondary | ICD-10-CM | POA: Diagnosis not present

## 2022-01-24 DIAGNOSIS — I2119 ST elevation (STEMI) myocardial infarction involving other coronary artery of inferior wall: Secondary | ICD-10-CM | POA: Diagnosis not present

## 2022-01-24 DIAGNOSIS — I2510 Atherosclerotic heart disease of native coronary artery without angina pectoris: Secondary | ICD-10-CM | POA: Diagnosis not present

## 2022-01-24 DIAGNOSIS — N179 Acute kidney failure, unspecified: Secondary | ICD-10-CM | POA: Diagnosis not present

## 2022-01-24 DIAGNOSIS — J449 Chronic obstructive pulmonary disease, unspecified: Secondary | ICD-10-CM | POA: Diagnosis not present

## 2022-01-24 DIAGNOSIS — F17210 Nicotine dependence, cigarettes, uncomplicated: Secondary | ICD-10-CM | POA: Diagnosis not present

## 2022-01-24 DIAGNOSIS — D72829 Elevated white blood cell count, unspecified: Secondary | ICD-10-CM | POA: Diagnosis not present

## 2022-01-24 DIAGNOSIS — N1831 Chronic kidney disease, stage 3a: Secondary | ICD-10-CM | POA: Diagnosis not present

## 2022-01-24 DIAGNOSIS — I480 Paroxysmal atrial fibrillation: Secondary | ICD-10-CM | POA: Diagnosis not present

## 2022-01-24 DIAGNOSIS — I5043 Acute on chronic combined systolic (congestive) and diastolic (congestive) heart failure: Secondary | ICD-10-CM | POA: Diagnosis not present

## 2022-01-24 DIAGNOSIS — I130 Hypertensive heart and chronic kidney disease with heart failure and stage 1 through stage 4 chronic kidney disease, or unspecified chronic kidney disease: Secondary | ICD-10-CM | POA: Diagnosis not present

## 2022-01-24 DIAGNOSIS — Z7901 Long term (current) use of anticoagulants: Secondary | ICD-10-CM | POA: Diagnosis not present

## 2022-01-24 NOTE — Telephone Encounter (Signed)
Advanced Heart Failure Patient Advocate Encounter  Sent Eliquis assistance application to [employer] 97/58. Document scanned to chart.

## 2022-01-25 DIAGNOSIS — I2119 ST elevation (STEMI) myocardial infarction involving other coronary artery of inferior wall: Secondary | ICD-10-CM | POA: Diagnosis not present

## 2022-01-25 DIAGNOSIS — I480 Paroxysmal atrial fibrillation: Secondary | ICD-10-CM | POA: Diagnosis not present

## 2022-01-25 DIAGNOSIS — I130 Hypertensive heart and chronic kidney disease with heart failure and stage 1 through stage 4 chronic kidney disease, or unspecified chronic kidney disease: Secondary | ICD-10-CM | POA: Diagnosis not present

## 2022-01-25 DIAGNOSIS — I2510 Atherosclerotic heart disease of native coronary artery without angina pectoris: Secondary | ICD-10-CM | POA: Diagnosis not present

## 2022-01-25 DIAGNOSIS — N1831 Chronic kidney disease, stage 3a: Secondary | ICD-10-CM | POA: Diagnosis not present

## 2022-01-25 DIAGNOSIS — I5043 Acute on chronic combined systolic (congestive) and diastolic (congestive) heart failure: Secondary | ICD-10-CM | POA: Diagnosis not present

## 2022-01-28 ENCOUNTER — Other Ambulatory Visit (HOSPITAL_COMMUNITY): Payer: Self-pay

## 2022-01-28 DIAGNOSIS — T50905A Adverse effect of unspecified drugs, medicaments and biological substances, initial encounter: Secondary | ICD-10-CM | POA: Diagnosis not present

## 2022-01-28 DIAGNOSIS — I252 Old myocardial infarction: Secondary | ICD-10-CM | POA: Diagnosis not present

## 2022-01-28 DIAGNOSIS — Z6839 Body mass index (BMI) 39.0-39.9, adult: Secondary | ICD-10-CM | POA: Diagnosis not present

## 2022-01-28 DIAGNOSIS — N182 Chronic kidney disease, stage 2 (mild): Secondary | ICD-10-CM | POA: Diagnosis not present

## 2022-01-28 DIAGNOSIS — B356 Tinea cruris: Secondary | ICD-10-CM | POA: Diagnosis not present

## 2022-01-28 DIAGNOSIS — I10 Essential (primary) hypertension: Secondary | ICD-10-CM | POA: Diagnosis not present

## 2022-01-28 DIAGNOSIS — F330 Major depressive disorder, recurrent, mild: Secondary | ICD-10-CM | POA: Diagnosis not present

## 2022-01-28 DIAGNOSIS — J329 Chronic sinusitis, unspecified: Secondary | ICD-10-CM | POA: Diagnosis not present

## 2022-01-28 DIAGNOSIS — L03119 Cellulitis of unspecified part of limb: Secondary | ICD-10-CM | POA: Diagnosis not present

## 2022-01-28 DIAGNOSIS — I872 Venous insufficiency (chronic) (peripheral): Secondary | ICD-10-CM | POA: Diagnosis not present

## 2022-01-29 DIAGNOSIS — I480 Paroxysmal atrial fibrillation: Secondary | ICD-10-CM | POA: Diagnosis not present

## 2022-01-29 DIAGNOSIS — N1831 Chronic kidney disease, stage 3a: Secondary | ICD-10-CM | POA: Diagnosis not present

## 2022-01-29 DIAGNOSIS — I130 Hypertensive heart and chronic kidney disease with heart failure and stage 1 through stage 4 chronic kidney disease, or unspecified chronic kidney disease: Secondary | ICD-10-CM | POA: Diagnosis not present

## 2022-01-29 DIAGNOSIS — I5043 Acute on chronic combined systolic (congestive) and diastolic (congestive) heart failure: Secondary | ICD-10-CM | POA: Diagnosis not present

## 2022-01-29 DIAGNOSIS — I2119 ST elevation (STEMI) myocardial infarction involving other coronary artery of inferior wall: Secondary | ICD-10-CM | POA: Diagnosis not present

## 2022-01-29 DIAGNOSIS — I2510 Atherosclerotic heart disease of native coronary artery without angina pectoris: Secondary | ICD-10-CM | POA: Diagnosis not present

## 2022-01-31 ENCOUNTER — Telehealth (HOSPITAL_COMMUNITY): Payer: Self-pay

## 2022-01-31 ENCOUNTER — Ambulatory Visit (HOSPITAL_COMMUNITY)
Admit: 2022-01-31 | Discharge: 2022-01-31 | Disposition: A | Discharge status: Home / Self Care | Payer: Medicare Other | Attending: Adult Health | Admitting: Adult Health

## 2022-01-31 ENCOUNTER — Encounter (HOSPITAL_COMMUNITY): Payer: Self-pay

## 2022-01-31 VITALS — BP 102/54 | HR 60 | Wt 232.5 lb

## 2022-01-31 DIAGNOSIS — N1831 Chronic kidney disease, stage 3a: Secondary | ICD-10-CM | POA: Diagnosis not present

## 2022-01-31 DIAGNOSIS — J449 Chronic obstructive pulmonary disease, unspecified: Secondary | ICD-10-CM | POA: Diagnosis not present

## 2022-01-31 DIAGNOSIS — I5042 Chronic combined systolic (congestive) and diastolic (congestive) heart failure: Secondary | ICD-10-CM | POA: Diagnosis not present

## 2022-01-31 DIAGNOSIS — I2510 Atherosclerotic heart disease of native coronary artery without angina pectoris: Secondary | ICD-10-CM | POA: Insufficient documentation

## 2022-01-31 DIAGNOSIS — I5043 Acute on chronic combined systolic (congestive) and diastolic (congestive) heart failure: Secondary | ICD-10-CM | POA: Diagnosis not present

## 2022-01-31 DIAGNOSIS — I130 Hypertensive heart and chronic kidney disease with heart failure and stage 1 through stage 4 chronic kidney disease, or unspecified chronic kidney disease: Secondary | ICD-10-CM | POA: Insufficient documentation

## 2022-01-31 DIAGNOSIS — R21 Rash and other nonspecific skin eruption: Secondary | ICD-10-CM | POA: Diagnosis not present

## 2022-01-31 DIAGNOSIS — N184 Chronic kidney disease, stage 4 (severe): Secondary | ICD-10-CM | POA: Diagnosis not present

## 2022-01-31 DIAGNOSIS — Z7901 Long term (current) use of anticoagulants: Secondary | ICD-10-CM | POA: Diagnosis not present

## 2022-01-31 DIAGNOSIS — R0602 Shortness of breath: Secondary | ICD-10-CM | POA: Diagnosis not present

## 2022-01-31 DIAGNOSIS — N179 Acute kidney failure, unspecified: Secondary | ICD-10-CM | POA: Insufficient documentation

## 2022-01-31 DIAGNOSIS — Z87891 Personal history of nicotine dependence: Secondary | ICD-10-CM | POA: Insufficient documentation

## 2022-01-31 DIAGNOSIS — I5022 Chronic systolic (congestive) heart failure: Secondary | ICD-10-CM

## 2022-01-31 DIAGNOSIS — I252 Old myocardial infarction: Secondary | ICD-10-CM | POA: Diagnosis not present

## 2022-01-31 DIAGNOSIS — I480 Paroxysmal atrial fibrillation: Secondary | ICD-10-CM | POA: Insufficient documentation

## 2022-01-31 DIAGNOSIS — I2119 ST elevation (STEMI) myocardial infarction involving other coronary artery of inferior wall: Secondary | ICD-10-CM | POA: Diagnosis not present

## 2022-01-31 LAB — BASIC METABOLIC PANEL
Anion gap: 12 (ref 5–15)
BUN: 60 mg/dL — ABNORMAL HIGH (ref 8–23)
CO2: 24 mmol/L (ref 22–32)
Calcium: 9.2 mg/dL (ref 8.9–10.3)
Chloride: 104 mmol/L (ref 98–111)
Creatinine, Ser: 1.84 mg/dL — ABNORMAL HIGH (ref 0.44–1.00)
GFR, Estimated: 27 mL/min — ABNORMAL LOW (ref >60)
Glucose, Bld: 95 mg/dL (ref 70–99)
Potassium: 4.2 mmol/L (ref 3.5–5.1)
Sodium: 140 mmol/L (ref 135–145)

## 2022-01-31 LAB — CBC
HCT: 32.1 % — ABNORMAL LOW (ref 36.0–46.0)
Hemoglobin: 10.2 g/dL — ABNORMAL LOW (ref 12.0–15.0)
MCH: 29.7 pg (ref 26.0–34.0)
MCHC: 31.8 g/dL (ref 30.0–36.0)
MCV: 93.3 fL (ref 80.0–100.0)
Platelets: 348 10*3/uL (ref 150–400)
RBC: 3.44 MIL/uL — ABNORMAL LOW (ref 3.87–5.11)
RDW: 13.8 % (ref 11.5–15.5)
WBC: 10.9 10*3/uL — ABNORMAL HIGH (ref 4.0–10.5)
nRBC: 0 % (ref 0.0–0.2)

## 2022-01-31 NOTE — Progress Notes (Signed)
PCP: Dr Gerarda Fraction Primary HF Cardiologist: Dr Aundra Dubin Nephrology:  [facility]    HPI: Brittany Jackson is a 81 y.o. female with history of chronic diastolic CHF, tobacco use, HTN, former smoker, and COPD.   Admitted 01/08/22 with inferolateral STEMI c/b acute systolic CHF with low-output. She was seen by CT surgery. Plan to manage CAD for now medically. Hospital course c/b AKI on CKD Stage IIIa, HAP, and leukocytosis. Briefly on CVVHD and had gradual improvement. Creatinine at d/c down to 2.0.Developed rash felt to be in the setting of amiodarone. Treated with steroids and switched to amio without dye. Discharged on 01/22/22.   Today she returns for post HF follow up. Overall feeling fine. Denies SOB/PND/Orthopnea. No chest pain. Appetite ok. No fever or chills. Weight at home trending down to 232 pounds. No bleeding issues. Refuses to weigh in the clinic. She has not smoked since discharge. Followed by [facility] HHPT.   Taking all medications.   Cardiac Testing  Echo 12/2021 EF 20-25%, mildly decreased RV systolic function, trivial MR, dilated IVC.  Repeat echo 05/29 with improvement in EF to 65-70% => suspect Takotsubo CMP.    Cath 01/09/22:  multivessel CAD with 90% d LM to ostial LAD, moderate 3+ MR, EF 20-25%, anterior/anterolateral akinesis, RA mean 17, PA mean 46, PCWP mean 37, LVEDP 32, Fick CO/CI 3.89/1.77.   ROS: All systems negative except as listed in HPI, PMH and Problem List.  SH:  Social History   Socioeconomic History   Marital status: Widowed    Spouse name: Not on file   Number of children: Not on file   Years of education: Not on file   Highest education level: Not on file  Occupational History   Not on file  Tobacco Use   Smoking status: Every Day    Packs/day: 0.50    Types: Cigarettes   Smokeless tobacco: Never  Vaping Use   Vaping Use: Never used  Substance and Sexual Activity   Alcohol use: Yes    Comment: occasional   Drug use: No   Sexual activity: Never    Birth  control/protection: Surgical  Other Topics Concern   Not on file  Social History Narrative   Not on file   Social Determinants of Health   Financial Resource Strain: Not on file  Food Insecurity: No Food Insecurity (01/22/2022)   Hunger Vital Sign    Worried About Running Out of Food in the Last Year: Never true    Ran Out of Food in the Last Year: Never true  Transportation Needs: No Transportation Needs (01/22/2022)   PRAPARE - Hydrologist (Medical): No    Lack of Transportation (Non-Medical): No  Physical Activity: Not on file  Stress: Not on file  Social Connections: Not on file  Intimate Partner Violence: Not on file    FH:  Family History  Problem Relation Age of Onset   Hypertension Sister    Allergic rhinitis Neg Hx    Asthma Neg Hx    Angioedema Neg Hx    Atopy Neg Hx    Eczema Neg Hx    Immunodeficiency Neg Hx    Urticaria Neg Hx     Past Medical History:  Diagnosis Date   CHF (congestive heart failure) (HCC)    Hypertension    Urticaria     Current Outpatient Medications  Medication Sig Dispense Refill   albuterol (PROVENTIL) 4 MG tablet Take 2 mg by mouth 3 (three)  times daily. As needed     amiodarone (PACERONE) 200 MG tablet Take 1 tablet (200 mg total) by mouth daily. 30 tablet 3   apixaban (ELIQUIS) 2.5 MG TABS tablet Take 1 tablet (2.5 mg total) by mouth 2 (two) times daily. 60 tablet 6   atorvastatin (LIPITOR) 80 MG tablet Take 1 tablet (80 mg total) by mouth daily. 30 tablet 6   cetirizine (ZYRTEC) 10 MG tablet Take 1-2 tablets 2 times per day maximum. 120 tablet 1   diazepam (VALIUM) 10 MG tablet Take 10 mg by mouth 3 (three) times daily as needed for anxiety.     hydrocortisone cream 1 % Apply topically as needed for itching. 28 g 0   hydrOXYzine (ATARAX) 25 MG tablet Take 1 tablet (25 mg total) by mouth 3 (three) times daily as needed for itching. 30 tablet 0   midodrine (PROAMATINE) 2.5 MG tablet Take 1 tablet (2.5  mg total) by mouth 3 (three) times daily with meals. 90 tablet 1   montelukast (SINGULAIR) 10 MG tablet Take 1 tablet (10 mg total) by mouth at bedtime. 90 tablet 1   Multiple Vitamins-Minerals (MULTI FOR HER 50+) TABS Take 1 tablet by mouth daily.     torsemide (DEMADEX) 20 MG tablet Take 1 tablet (20 mg total) by mouth daily. 30 tablet 6   Ascorbic Acid (VITAMIN C) 1000 MG tablet Take 1,000 mg by mouth daily. (Patient not taking: Reported on 01/31/2022)     b complex vitamins capsule Take 1 capsule by mouth daily. (Patient not taking: Reported on 01/31/2022)     famotidine (PEPCID) 40 MG tablet Take 1 tablet (40 mg total) by mouth 2 (two) times daily. (Patient not taking: Reported on 01/31/2022) 60 tablet 2   Red Yeast Rice 600 MG CAPS Take 600 mg by mouth daily. (Patient not taking: Reported on 01/31/2022)     zinc gluconate 50 MG tablet Take 50 mg by mouth daily. (Patient not taking: Reported on 01/31/2022)     No current facility-administered medications for this encounter.    Vitals:   01/31/22 1536  BP: (!) 102/54  Pulse: 60  SpO2: 94%  Weight: 105.5 kg (232 lb 8 oz)   Wt Readings from Last 3 Encounters:  01/31/22 105.5 kg (232 lb 8 oz)  01/21/22 115.4 kg (254 lb 6.4 oz)  06/16/20 112.6 kg (248 lb 3.2 oz)    PHYSICAL EXAM: General:  Walked in the clinic.  No resp difficulty HEENT: normal Neck: supple. JVP flat. Carotids 2+ bilaterally; no bruits. No lymphadenopathy or thryomegaly appreciated. Cor: PMI normal. Regular rate & rhythm. No rubs, gallops or murmurs. Lungs: clear Abdomen: soft, nontender, nondistended. No hepatosplenomegaly. No bruits or masses. Good bowel sounds. Extremities: no cyanosis, clubbing, rash, R and LLE mild erythema. Trace lower extremity edema.  Neuro: alert & orientedx3, cranial nerves grossly intact. Moves all 4 extremities w/o difficulty. Affect pleasant. Skin: Dry scales.   EKG: Sinus Brady 59 bpm.    ASSESS 1. CAD: Patient presented with ACS.  Peak HS-TnI 3557. LHC with 3VD, 50-60% dLM, ?90% ostial LAD, 75% stenosis at bifurcation of mLAD and D, 75% stenosis at bifurcation of mLCx and OM.  The ostial LAD is not completely laid out and on review, not sure that it is truly critical.  CABG would be a consideration if LAD disease is severe, may require FFR to determine this. PCI distal left main into LAD also an option if FFR shows severe disease.  For now,  medical management given current clinical issues and recovery of LV systolic function.  - No chest pain. Continue Eliquis.    - Continue high intensity statin. atorvastatin 80 daily - When she has stabilized medically and creatinine has come down, would favor repeat cath with FFR LM into LAD to assess significance of distal LM/ostial LAD disease. - Check BMET today.  2. H/O HFrEF in the setting of suspected Takotsubo. Patient has CAD, but not sure it explains the extent of her initial cardiomyopathy.  ?Stress (Takotsubo-type) CMP with pre-existing CAD.  Low output on initial RHC with CI 1.8.  Filling pressures significantly elevated on RHC as well.  Echo initially with EF 20-25%, mildly decreased RV systolic function, trivial MR, dilated IVC. Repeat echo 05/29 with improvement in EF to 65-70% => suspect Takotsubo CMP.   NYHA II. Volume status stable. Continue torsemide 20 mg daily.   - SBP soft. Continue midodrine to 2.5 mg TID. - Check BMET  3.  Atrial fibrillation: Paroxysmal In SR today.  - Maintaining SR. Discussed.  - Continue eliquis.  Pharmacy assisted with amio pill without the dye. Now on po amiodarone, using a differently-colored pill.  - On issues.  4.  CKD Stage IV.  Recent AKi during hospitalization. Creatinine up to 3.67. Suspect cardiorenal/ATN with low output + CIN.  With persistent hyponatremia not responding well to 3% saline and volume not significantly elevated to suggest benefit from tolvaptan, she was started CVVH on 05/29.  This was stopped on 5/31.  She would not want  long-term HD.  Creatinine stable at 2.03 at d/c  - check BMET today  - She is making f/u with nephrology.  5. Rash: Diffuse erythematous rash.  She has history of inflammatory dermatosis but she thinks this looks different.  ?drug eruption (recently stopped cefepime, ?dye from po amiodarone tab).  Looks somewhat better today, less pruritic.  Prednisone course ordered => now completed.  Resolved.  6. . COPD: Patient was a smoker up until admission.   She has f/u with Pulmonary.   F/U dr Aundra Dubin in 3 weeks.   Check CBC and BMET.   Brittany Bergevin NP-C 4:38 PM

## 2022-01-31 NOTE — Telephone Encounter (Signed)
Received communication from [place] that the patient was over the income for assistance. According to the application she falls within the income limits. Representative for [employer] states that the electronic income they pulled does not match the application. POI would be needed in the form of last years tax return or a social security statement.  Called and left the patient a detailed message on how to provide POI and a callback number for questions.  Charlann Boxer, CPhT

## 2022-01-31 NOTE — Telephone Encounter (Signed)
Pt insurance is active and benefits verified through Medicare A/B. Co-pay $0.00, DED $226.00/$226.00 met, out of pocket $0.00/$0.00 met, co-insurance 20%. No pre-authorization required. Passport, 01/31/22 @ 2:03PM, WGN#[id number]   How many CR sessions are covered? (36 sessions for TCR, 72 sessions for ICR)72 Is this a lifetime maximum or an annual maximum? No Has the member used any of these services to date? No Is there a time limit (weeks/months) on start of program and/or program completion? No   2ndary insurance is active and benefits verified through [employer]. Co-pay $0.00, DED $0.00/$0.00 met, out of pocket $0.00/$0.00 met, co-insurance 0%. No pre-authorization required.      Will contact patient to see if she is interested in the Cardiac Rehab Program. If interested, patient will need to complete follow up appt. Once completed, patient will be contacted for scheduling upon review by the RN Navigator.

## 2022-01-31 NOTE — Telephone Encounter (Signed)
Attempted to call patient in regards to Cardiac Rehab - LM on VM 

## 2022-01-31 NOTE — Patient Instructions (Signed)
EKG done today.  Labs done today. We will contact you only if your labs are abnormal.  No other medication changes were made. Please continue all current medications as prescribed.  Your physician recommends that you schedule a follow-up appointment soon with Dr. Aundra Dubin.   If you have any questions or concerns before your next appointment please send [place] a message through [place] or call our office at [phone].    TO LEAVE A MESSAGE FOR THE NURSE SELECT OPTION 2, PLEASE LEAVE A MESSAGE INCLUDING: YOUR NAME DATE OF BIRTH CALL BACK NUMBER REASON FOR CALL**this is important as we prioritize the call backs  YOU WILL RECEIVE A CALL BACK THE SAME DAY AS LONG AS YOU CALL BEFORE 4:00 PM   Do the following things EVERYDAY: Weigh yourself in the morning before breakfast. Write it down and keep it in a log. Take your medicines as prescribed Eat low salt foods--Limit salt (sodium) to 2000 mg per day.  Stay as active as you can everyday Limit all fluids for the day to less than 2 liters   At the [facility], you and your health needs are our priority. As part of our continuing mission to provide you with exceptional heart care, we have created designated Provider Care Teams. These Care Teams include your primary Cardiologist (physician) and Advanced Practice Providers (APPs- Physician Assistants and Nurse Practitioners) who all work together to provide you with the care you need, when you need it.   You may see any of the following providers on your designated Care Team at your next follow up: Dr Glori Bickers Dr Haynes Kerns, NP Lyda Jester, [place] Audry Riles, PharmD   Please be sure to bring in all your medications bottles to every appointment.

## 2022-02-01 ENCOUNTER — Telehealth (HOSPITAL_COMMUNITY): Payer: Self-pay | Admitting: Surgery

## 2022-02-01 NOTE — Telephone Encounter (Signed)
-----   Message from Conrad [place], NP sent at 02/01/2022 11:54 AM EDT ----- WBC improved and down to 10.9 . Hgb stable.   Kidney function creatinine down to 2.8.   Good news. No change. Please call.

## 2022-02-01 NOTE — Telephone Encounter (Signed)
I attempted to reach patient to review results and recommendations per provider.  I left a message for a return call. 

## 2022-02-04 ENCOUNTER — Telehealth (HOSPITAL_COMMUNITY): Payer: Self-pay

## 2022-02-04 ENCOUNTER — Telehealth (HOSPITAL_COMMUNITY): Payer: Self-pay | Admitting: Surgery

## 2022-02-04 DIAGNOSIS — I509 Heart failure, unspecified: Secondary | ICD-10-CM | POA: Diagnosis not present

## 2022-02-04 DIAGNOSIS — I129 Hypertensive chronic kidney disease with stage 1 through stage 4 chronic kidney disease, or unspecified chronic kidney disease: Secondary | ICD-10-CM | POA: Diagnosis not present

## 2022-02-04 DIAGNOSIS — E871 Hypo-osmolality and hyponatremia: Secondary | ICD-10-CM | POA: Diagnosis not present

## 2022-02-04 DIAGNOSIS — D649 Anemia, unspecified: Secondary | ICD-10-CM | POA: Diagnosis not present

## 2022-02-04 DIAGNOSIS — N184 Chronic kidney disease, stage 4 (severe): Secondary | ICD-10-CM | POA: Diagnosis not present

## 2022-02-04 DIAGNOSIS — R21 Rash and other nonspecific skin eruption: Secondary | ICD-10-CM | POA: Diagnosis not present

## 2022-02-04 NOTE — Telephone Encounter (Addendum)
Pt aware, agreeable, and verbalized understanding   ----- Message from Conrad [place], NP sent at 02/01/2022 11:54 AM EDT ----- WBC improved and down to 10.9 . Hgb stable.   Kidney function creatinine down to 2.8.   Good news. No change. Please call.

## 2022-02-04 NOTE — Telephone Encounter (Signed)
I called and spoke with patient to review results and recommendations per provider.  Patient had several questions regarding current prescribed medication and requested copies of labwork to be sent to her in the mail. Patient grateful for the information and phone call.

## 2022-02-06 ENCOUNTER — Other Ambulatory Visit: Payer: Self-pay | Admitting: Family

## 2022-02-06 DIAGNOSIS — N1831 Chronic kidney disease, stage 3a: Secondary | ICD-10-CM | POA: Diagnosis not present

## 2022-02-06 DIAGNOSIS — I2119 ST elevation (STEMI) myocardial infarction involving other coronary artery of inferior wall: Secondary | ICD-10-CM | POA: Diagnosis not present

## 2022-02-06 DIAGNOSIS — I5043 Acute on chronic combined systolic (congestive) and diastolic (congestive) heart failure: Secondary | ICD-10-CM | POA: Diagnosis not present

## 2022-02-06 DIAGNOSIS — I2510 Atherosclerotic heart disease of native coronary artery without angina pectoris: Secondary | ICD-10-CM | POA: Diagnosis not present

## 2022-02-06 DIAGNOSIS — I480 Paroxysmal atrial fibrillation: Secondary | ICD-10-CM | POA: Diagnosis not present

## 2022-02-06 DIAGNOSIS — I130 Hypertensive heart and chronic kidney disease with heart failure and stage 1 through stage 4 chronic kidney disease, or unspecified chronic kidney disease: Secondary | ICD-10-CM | POA: Diagnosis not present

## 2022-02-07 DIAGNOSIS — I2510 Atherosclerotic heart disease of native coronary artery without angina pectoris: Secondary | ICD-10-CM | POA: Diagnosis not present

## 2022-02-07 DIAGNOSIS — N1831 Chronic kidney disease, stage 3a: Secondary | ICD-10-CM | POA: Diagnosis not present

## 2022-02-07 DIAGNOSIS — I480 Paroxysmal atrial fibrillation: Secondary | ICD-10-CM | POA: Diagnosis not present

## 2022-02-07 DIAGNOSIS — I5043 Acute on chronic combined systolic (congestive) and diastolic (congestive) heart failure: Secondary | ICD-10-CM | POA: Diagnosis not present

## 2022-02-07 DIAGNOSIS — I130 Hypertensive heart and chronic kidney disease with heart failure and stage 1 through stage 4 chronic kidney disease, or unspecified chronic kidney disease: Secondary | ICD-10-CM | POA: Diagnosis not present

## 2022-02-07 DIAGNOSIS — I2119 ST elevation (STEMI) myocardial infarction involving other coronary artery of inferior wall: Secondary | ICD-10-CM | POA: Diagnosis not present

## 2022-02-08 ENCOUNTER — Other Ambulatory Visit (HOSPITAL_COMMUNITY): Payer: Self-pay

## 2022-02-08 ENCOUNTER — Telehealth (HOSPITAL_COMMUNITY): Payer: Self-pay

## 2022-02-11 DIAGNOSIS — I2119 ST elevation (STEMI) myocardial infarction involving other coronary artery of inferior wall: Secondary | ICD-10-CM | POA: Diagnosis not present

## 2022-02-11 DIAGNOSIS — N1831 Chronic kidney disease, stage 3a: Secondary | ICD-10-CM | POA: Diagnosis not present

## 2022-02-11 DIAGNOSIS — I130 Hypertensive heart and chronic kidney disease with heart failure and stage 1 through stage 4 chronic kidney disease, or unspecified chronic kidney disease: Secondary | ICD-10-CM | POA: Diagnosis not present

## 2022-02-11 DIAGNOSIS — I2510 Atherosclerotic heart disease of native coronary artery without angina pectoris: Secondary | ICD-10-CM | POA: Diagnosis not present

## 2022-02-11 DIAGNOSIS — I480 Paroxysmal atrial fibrillation: Secondary | ICD-10-CM | POA: Diagnosis not present

## 2022-02-11 DIAGNOSIS — I5043 Acute on chronic combined systolic (congestive) and diastolic (congestive) heart failure: Secondary | ICD-10-CM | POA: Diagnosis not present

## 2022-02-13 DIAGNOSIS — I480 Paroxysmal atrial fibrillation: Secondary | ICD-10-CM | POA: Diagnosis not present

## 2022-02-13 DIAGNOSIS — N1831 Chronic kidney disease, stage 3a: Secondary | ICD-10-CM | POA: Diagnosis not present

## 2022-02-13 DIAGNOSIS — I2119 ST elevation (STEMI) myocardial infarction involving other coronary artery of inferior wall: Secondary | ICD-10-CM | POA: Diagnosis not present

## 2022-02-13 DIAGNOSIS — I2510 Atherosclerotic heart disease of native coronary artery without angina pectoris: Secondary | ICD-10-CM | POA: Diagnosis not present

## 2022-02-13 DIAGNOSIS — I130 Hypertensive heart and chronic kidney disease with heart failure and stage 1 through stage 4 chronic kidney disease, or unspecified chronic kidney disease: Secondary | ICD-10-CM | POA: Diagnosis not present

## 2022-02-13 DIAGNOSIS — I5043 Acute on chronic combined systolic (congestive) and diastolic (congestive) heart failure: Secondary | ICD-10-CM | POA: Diagnosis not present

## 2022-02-18 DIAGNOSIS — I480 Paroxysmal atrial fibrillation: Secondary | ICD-10-CM | POA: Diagnosis not present

## 2022-02-18 DIAGNOSIS — I130 Hypertensive heart and chronic kidney disease with heart failure and stage 1 through stage 4 chronic kidney disease, or unspecified chronic kidney disease: Secondary | ICD-10-CM | POA: Diagnosis not present

## 2022-02-18 DIAGNOSIS — I5043 Acute on chronic combined systolic (congestive) and diastolic (congestive) heart failure: Secondary | ICD-10-CM | POA: Diagnosis not present

## 2022-02-18 DIAGNOSIS — N1831 Chronic kidney disease, stage 3a: Secondary | ICD-10-CM | POA: Diagnosis not present

## 2022-02-18 DIAGNOSIS — I2510 Atherosclerotic heart disease of native coronary artery without angina pectoris: Secondary | ICD-10-CM | POA: Diagnosis not present

## 2022-02-18 DIAGNOSIS — I2119 ST elevation (STEMI) myocardial infarction involving other coronary artery of inferior wall: Secondary | ICD-10-CM | POA: Diagnosis not present

## 2022-02-20 ENCOUNTER — Emergency Department (HOSPITAL_COMMUNITY)
Admission: EM | Admit: 2022-02-20 | Discharge: 2022-02-20 | Disposition: A | Discharge status: Home / Self Care | Payer: Medicare Other | Attending: Emergency Medicine | Admitting: Emergency Medicine

## 2022-02-20 ENCOUNTER — Encounter (HOSPITAL_COMMUNITY): Payer: Self-pay

## 2022-02-20 ENCOUNTER — Other Ambulatory Visit: Payer: Self-pay

## 2022-02-20 ENCOUNTER — Emergency Department (HOSPITAL_COMMUNITY): Payer: Medicare Other

## 2022-02-20 DIAGNOSIS — I509 Heart failure, unspecified: Secondary | ICD-10-CM | POA: Diagnosis not present

## 2022-02-20 DIAGNOSIS — I2510 Atherosclerotic heart disease of native coronary artery without angina pectoris: Secondary | ICD-10-CM | POA: Insufficient documentation

## 2022-02-20 DIAGNOSIS — R079 Chest pain, unspecified: Secondary | ICD-10-CM | POA: Diagnosis not present

## 2022-02-20 DIAGNOSIS — I110 Hypertensive heart disease with heart failure: Secondary | ICD-10-CM | POA: Insufficient documentation

## 2022-02-20 DIAGNOSIS — I130 Hypertensive heart and chronic kidney disease with heart failure and stage 1 through stage 4 chronic kidney disease, or unspecified chronic kidney disease: Secondary | ICD-10-CM | POA: Diagnosis not present

## 2022-02-20 DIAGNOSIS — R112 Nausea with vomiting, unspecified: Secondary | ICD-10-CM | POA: Insufficient documentation

## 2022-02-20 DIAGNOSIS — N179 Acute kidney failure, unspecified: Secondary | ICD-10-CM | POA: Diagnosis not present

## 2022-02-20 DIAGNOSIS — R0789 Other chest pain: Secondary | ICD-10-CM | POA: Diagnosis not present

## 2022-02-20 DIAGNOSIS — F17200 Nicotine dependence, unspecified, uncomplicated: Secondary | ICD-10-CM | POA: Diagnosis not present

## 2022-02-20 DIAGNOSIS — R0602 Shortness of breath: Secondary | ICD-10-CM | POA: Diagnosis present

## 2022-02-20 DIAGNOSIS — R059 Cough, unspecified: Secondary | ICD-10-CM | POA: Insufficient documentation

## 2022-02-20 DIAGNOSIS — N1831 Chronic kidney disease, stage 3a: Secondary | ICD-10-CM | POA: Diagnosis not present

## 2022-02-20 DIAGNOSIS — Z7901 Long term (current) use of anticoagulants: Secondary | ICD-10-CM | POA: Diagnosis not present

## 2022-02-20 DIAGNOSIS — I10 Essential (primary) hypertension: Secondary | ICD-10-CM | POA: Diagnosis not present

## 2022-02-20 DIAGNOSIS — I480 Paroxysmal atrial fibrillation: Secondary | ICD-10-CM | POA: Diagnosis not present

## 2022-02-20 DIAGNOSIS — I5043 Acute on chronic combined systolic (congestive) and diastolic (congestive) heart failure: Secondary | ICD-10-CM | POA: Diagnosis not present

## 2022-02-20 DIAGNOSIS — I2119 ST elevation (STEMI) myocardial infarction involving other coronary artery of inferior wall: Secondary | ICD-10-CM | POA: Diagnosis not present

## 2022-02-20 LAB — COMPREHENSIVE METABOLIC PANEL
ALT: 26 U/L (ref 0–44)
AST: 31 U/L (ref 15–41)
Albumin: 3.7 g/dL (ref 3.5–5.0)
Alkaline Phosphatase: 95 U/L (ref 38–126)
Anion gap: 13 (ref 5–15)
BUN: 80 mg/dL — ABNORMAL HIGH (ref 8–23)
CO2: 21 mmol/L — ABNORMAL LOW (ref 22–32)
Calcium: 9.6 mg/dL (ref 8.9–10.3)
Chloride: 100 mmol/L (ref 98–111)
Creatinine, Ser: 2.4 mg/dL — ABNORMAL HIGH (ref 0.44–1.00)
GFR, Estimated: 20 mL/min — ABNORMAL LOW (ref >60)
Glucose, Bld: 149 mg/dL — ABNORMAL HIGH (ref 70–99)
Potassium: 4.6 mmol/L (ref 3.5–5.1)
Sodium: 134 mmol/L — ABNORMAL LOW (ref 135–145)
Total Bilirubin: 0.7 mg/dL (ref 0.3–1.2)
Total Protein: 7 g/dL (ref 6.5–8.1)

## 2022-02-20 LAB — CBC WITH DIFFERENTIAL/PLATELET
Abs Immature Granulocytes: 0.06 10*3/uL (ref 0.00–0.07)
Basophils Absolute: 0 10*3/uL (ref 0.0–0.1)
Basophils Relative: 0 %
Eosinophils Absolute: 0.3 10*3/uL (ref 0.0–0.5)
Eosinophils Relative: 3 %
HCT: 35.4 % — ABNORMAL LOW (ref 36.0–46.0)
Hemoglobin: 11.7 g/dL — ABNORMAL LOW (ref 12.0–15.0)
Immature Granulocytes: 1 %
Lymphocytes Relative: 16 %
Lymphs Abs: 1.7 10*3/uL (ref 0.7–4.0)
MCH: 29.8 pg (ref 26.0–34.0)
MCHC: 33.1 g/dL (ref 30.0–36.0)
MCV: 90.1 fL (ref 80.0–100.0)
Monocytes Absolute: 0.7 10*3/uL (ref 0.1–1.0)
Monocytes Relative: 6 %
Neutro Abs: 7.9 10*3/uL — ABNORMAL HIGH (ref 1.7–7.7)
Neutrophils Relative %: 74 %
Platelets: 313 10*3/uL (ref 150–400)
RBC: 3.93 MIL/uL (ref 3.87–5.11)
RDW: 12.6 % (ref 11.5–15.5)
WBC: 10.6 10*3/uL — ABNORMAL HIGH (ref 4.0–10.5)
nRBC: 0 % (ref 0.0–0.2)

## 2022-02-20 LAB — BRAIN NATRIURETIC PEPTIDE: B Natriuretic Peptide: 48 pg/mL (ref 0.0–100.0)

## 2022-02-20 LAB — TROPONIN I (HIGH SENSITIVITY)
Troponin I (High Sensitivity): 11 ng/L (ref <18)
Troponin I (High Sensitivity): 13 ng/L (ref <18)

## 2022-02-20 LAB — LACTIC ACID, PLASMA
Lactic Acid, Venous: 1.5 mmol/L (ref 0.5–1.9)
Lactic Acid, Venous: 1.8 mmol/L (ref 0.5–1.9)

## 2022-02-20 MED ORDER — SODIUM CHLORIDE 0.9 % IV BOLUS
500.0000 mL | Freq: Once | INTRAVENOUS | Status: AC
  Administered 2022-02-20: 500 mL via INTRAVENOUS

## 2022-02-20 NOTE — ED Provider Notes (Signed)
[facility] EMERGENCY DEPARTMENT Provider Note   CSN: [id number] Arrival date & time: 02/20/22  1547     History {Add pertinent medical, surgical, social history, OB history to HPI:1} Chief Complaint  Patient presents with   Chest Pain    Brittany Jackson is a 81 y.o. female.  She has a history of CHF hypertension and tobacco use and was recently here at the end of May for a STEMI.  This was complicated by CHF and A-fib, AKI required some dialysis.  She is here with a complaint of some sharp left-sided chest pain under her breast with some radiation to her left arm 4 out of 10 worse with taking a deep breath.  Started sometime last evening early this morning.  This is different than her prior presentation which was shortness of breath.  She does endorse a chronic cough which she relates to her tobacco use.  She also said she had diarrhea and dry heaves yesterday.  No fevers or chills.  The history is provided by the patient and the EMS personnel.  Chest Pain Pain location:  L chest Pain quality: sharp   Pain radiates to:  L arm Pain severity:  Moderate Onset quality:  Gradual Duration:  12 hours Timing:  Intermittent Progression:  Unchanged Chronicity:  New Relieved by:  None tried Worsened by:  Deep breathing Ineffective treatments:  None tried Associated symptoms: cough, nausea and vomiting   Associated symptoms: no abdominal pain, no diaphoresis, no fever, no headache, no shortness of breath and no syncope   Risk factors: coronary artery disease, hypertension and smoking        Home Medications Prior to Admission medications   Medication Sig Start Date End Date Taking? Authorizing Provider  albuterol (PROVENTIL) 4 MG tablet Take 2 mg by mouth 3 (three) times daily. As needed 02/15/20   [provider]  amiodarone (PACERONE) 200 MG tablet Take 1 tablet (200 mg total) by mouth daily. 01/22/22   Larey Dresser, MD  apixaban (ELIQUIS) 2.5 MG TABS tablet  Take 1 tablet (2.5 mg total) by mouth 2 (two) times daily. 01/22/22   Clegg, Amy D, NP  Ascorbic Acid (VITAMIN C) 1000 MG tablet Take 1,000 mg by mouth daily. Patient not taking: Reported on 01/31/2022    [provider]  atorvastatin (LIPITOR) 80 MG tablet Take 1 tablet (80 mg total) by mouth daily. 01/23/22   Clegg, Amy D, NP  b complex vitamins capsule Take 1 capsule by mouth daily. Patient not taking: Reported on 01/31/2022    [provider]  cetirizine (ZYRTEC) 10 MG tablet Take 1-2 tablets 2 times per day maximum. 04/04/20   Kozlow, Donnamarie Poag, MD  diazepam (VALIUM) 10 MG tablet Take 10 mg by mouth 3 (three) times daily as needed for anxiety. 06/09/20   [provider]  famotidine (PEPCID) 40 MG tablet Take 1 tablet (40 mg total) by mouth 2 (two) times daily. Patient not taking: Reported on 01/31/2022 07/18/20   Jiles Prows, MD  hydrocortisone cream 1 % Apply topically as needed for itching. 01/22/22   Clegg, Amy D, NP  hydrOXYzine (ATARAX) 25 MG tablet Take 1 tablet (25 mg total) by mouth 3 (three) times daily as needed for itching. 01/22/22   Clegg, Amy D, NP  midodrine (PROAMATINE) 2.5 MG tablet Take 1 tablet (2.5 mg total) by mouth 3 (three) times daily with meals. 01/22/22   Clegg, Amy D, NP  montelukast (SINGULAIR) 10 MG  tablet Take 1 tablet (10 mg total) by mouth at bedtime. 07/20/20   Kozlow, Donnamarie Poag, MD  Multiple Vitamins-Minerals (MULTI FOR HER 50+) TABS Take 1 tablet by mouth daily.    [provider]  Red Yeast Rice 600 MG CAPS Take 600 mg by mouth daily. Patient not taking: Reported on 01/31/2022    [provider]  torsemide (DEMADEX) 20 MG tablet Take 1 tablet (20 mg total) by mouth daily. 01/23/22   Clegg, Amy D, NP  zinc gluconate 50 MG tablet Take 50 mg by mouth daily. Patient not taking: Reported on 01/31/2022    [provider]      Allergies    Codeine, Meprobamate, and Sulfa antibiotics    Review of Systems   Review of Systems   Constitutional:  Negative for diaphoresis and fever.  HENT:  Negative for sore throat.   Eyes:  Negative for visual disturbance.  Respiratory:  Positive for cough. Negative for shortness of breath.   Cardiovascular:  Positive for chest pain. Negative for syncope.  Gastrointestinal:  Positive for nausea and vomiting. Negative for abdominal pain.  Genitourinary:  Negative for dysuria.  Musculoskeletal:  Negative for neck pain.  Skin:  Negative for rash.  Neurological:  Negative for headaches.    Physical Exam Updated Vital Signs BP (!) 117/53 (BP Location: Right Arm)   Pulse 75   Temp 97.8 F (36.6 C) (Oral)   Resp 17   Ht '5\' 6"'$  (1.676 m)   Wt 98.4 kg   SpO2 96%   BMI 35.02 kg/m  Physical Exam Vitals and nursing note reviewed.  Constitutional:      General: She is not in acute distress.    Appearance: She is well-developed.  HENT:     Head: Normocephalic and atraumatic.  Eyes:     Conjunctiva/sclera: Conjunctivae normal.  Cardiovascular:     Rate and Rhythm: Normal rate and regular rhythm.     Heart sounds: Normal heart sounds. No murmur heard. Pulmonary:     Effort: Pulmonary effort is normal. No respiratory distress.     Breath sounds: Normal breath sounds.  Abdominal:     Palpations: Abdomen is soft.     Tenderness: There is no abdominal tenderness. There is no guarding or rebound.  Musculoskeletal:        General: No swelling. Normal range of motion.     Cervical back: Neck supple.     Right lower leg: No tenderness. No edema.     Left lower leg: No tenderness. No edema.  Skin:    General: Skin is warm and dry.     Capillary Refill: Capillary refill takes less than 2 seconds.  Neurological:     Mental Status: She is alert.  Psychiatric:        Mood and Affect: Mood normal.     ED Results / Procedures / Treatments   Labs (all labs ordered are listed, but only abnormal results are displayed) Labs Reviewed  CBC WITH DIFFERENTIAL/PLATELET  COMPREHENSIVE  METABOLIC PANEL  LACTIC ACID, PLASMA  LACTIC ACID, PLASMA  BRAIN NATRIURETIC PEPTIDE  TROPONIN I (HIGH SENSITIVITY)    EKG EKG Interpretation  Date/Time:  Wednesday February 20 2022 16:03:01 EDT Ventricular Rate:  72 PR Interval:  244 QRS Duration: 110 QT Interval:  402 QTC Calculation: 440 R Axis:   18 Text Interpretation: Sinus rhythm Prolonged PR interval Incomplete left bundle branch block Low voltage, precordial leads Consider anterior infarct t wave inversions anterior and  inferior have resolved from prior 6/23 Confirmed by Aletta Edouard [phone]) on 02/20/2022 4:08:00 PM  Radiology No results found.  Procedures Procedures  {Document cardiac monitor, telemetry assessment procedure when appropriate:1}  Medications Ordered in ED Medications - No data to display  ED Course/ Medical Decision Making/ A&P                           Medical Decision Making Amount and/or Complexity of Data Reviewed Labs: ordered. Radiology: ordered.  This patient complains of ***; this involves an extensive number of treatment Options and is a complaint that carries with it a high risk of complications and morbidity. The differential includes ***  I ordered, reviewed and interpreted labs, which included *** I ordered medication *** and reviewed PMP when indicated. I ordered imaging studies which included *** and I independently    visualized and interpreted imaging which showed *** Additional history obtained from *** Previous records obtained and reviewed *** I consulted *** and discussed lab and imaging findings and discussed disposition.  Cardiac monitoring reviewed, *** Social determinants considered, *** Critical Interventions: ***  After the interventions stated above, I reevaluated the patient and found *** Admission and further testing considered, ***    {Document critical care time when appropriate:1} {Document review of labs and clinical decision tools ie heart score,  Chads2Vasc2 etc:1}  {Document your independent review of radiology images, and any outside records:1} {Document your discussion with family members, caretakers, and with consultants:1} {Document social determinants of health affecting pt's care:1} {Document your decision making why or why not admission, treatments were needed:1} Final Clinical Impression(s) / ED Diagnoses Final diagnoses:  None    Rx / DC Orders ED Discharge Orders     None

## 2022-02-20 NOTE — ED Notes (Signed)
RN reviewed discharge instructions with pt. Pt verbalized understanding and had no further questions. VSS upon discharge.  

## 2022-02-20 NOTE — ED Triage Notes (Signed)
Pt arrives via [facility] c/o CP that started last PM. Feels the pain worse when she takes a deep breath. Worse with palpation and movement. Hx MI 5 weeks ago. EKG unremarkable. No asa meds given by EMS. 18g L AC. VS WNL.

## 2022-02-20 NOTE — Discharge Instructions (Signed)
You are seen in the emergency department for some sharp left-sided chest pain.  You had blood work chest x-ray and an EKG that did not show any evidence of heart injury.  Your renal function is worsened since he left the hospital.  We gave you some IV fluids and you are otherwise not having any symptoms.  Please contact your primary care doctor to have your renal function rechecked soon.  Keep well-hydrated.  Return to the emergency department if any worsening or concerning symptoms.

## 2022-02-21 ENCOUNTER — Other Ambulatory Visit (HOSPITAL_COMMUNITY): Payer: Self-pay

## 2022-02-23 DIAGNOSIS — E871 Hypo-osmolality and hyponatremia: Secondary | ICD-10-CM | POA: Diagnosis not present

## 2022-02-23 DIAGNOSIS — Z9181 History of falling: Secondary | ICD-10-CM | POA: Diagnosis not present

## 2022-02-23 DIAGNOSIS — I480 Paroxysmal atrial fibrillation: Secondary | ICD-10-CM | POA: Diagnosis not present

## 2022-02-23 DIAGNOSIS — N179 Acute kidney failure, unspecified: Secondary | ICD-10-CM | POA: Diagnosis not present

## 2022-02-23 DIAGNOSIS — F17210 Nicotine dependence, cigarettes, uncomplicated: Secondary | ICD-10-CM | POA: Diagnosis not present

## 2022-02-23 DIAGNOSIS — J449 Chronic obstructive pulmonary disease, unspecified: Secondary | ICD-10-CM | POA: Diagnosis not present

## 2022-02-23 DIAGNOSIS — N1831 Chronic kidney disease, stage 3a: Secondary | ICD-10-CM | POA: Diagnosis not present

## 2022-02-23 DIAGNOSIS — I2510 Atherosclerotic heart disease of native coronary artery without angina pectoris: Secondary | ICD-10-CM | POA: Diagnosis not present

## 2022-02-23 DIAGNOSIS — D72829 Elevated white blood cell count, unspecified: Secondary | ICD-10-CM | POA: Diagnosis not present

## 2022-02-23 DIAGNOSIS — I130 Hypertensive heart and chronic kidney disease with heart failure and stage 1 through stage 4 chronic kidney disease, or unspecified chronic kidney disease: Secondary | ICD-10-CM | POA: Diagnosis not present

## 2022-02-23 DIAGNOSIS — I2119 ST elevation (STEMI) myocardial infarction involving other coronary artery of inferior wall: Secondary | ICD-10-CM | POA: Diagnosis not present

## 2022-02-23 DIAGNOSIS — Z7901 Long term (current) use of anticoagulants: Secondary | ICD-10-CM | POA: Diagnosis not present

## 2022-02-23 DIAGNOSIS — I5043 Acute on chronic combined systolic (congestive) and diastolic (congestive) heart failure: Secondary | ICD-10-CM | POA: Diagnosis not present

## 2022-02-24 NOTE — Telephone Encounter (Signed)
Advanced Heart Failure Patient Advocate Encounter   Patient was approved to receive Eliquis from [facility]  Effective dates: 02/13/22 through 08/18/22  Document scanned to chart.   Charlann Boxer, CPhT

## 2022-02-25 DIAGNOSIS — I2510 Atherosclerotic heart disease of native coronary artery without angina pectoris: Secondary | ICD-10-CM | POA: Diagnosis not present

## 2022-02-25 DIAGNOSIS — I2119 ST elevation (STEMI) myocardial infarction involving other coronary artery of inferior wall: Secondary | ICD-10-CM | POA: Diagnosis not present

## 2022-02-25 DIAGNOSIS — I130 Hypertensive heart and chronic kidney disease with heart failure and stage 1 through stage 4 chronic kidney disease, or unspecified chronic kidney disease: Secondary | ICD-10-CM | POA: Diagnosis not present

## 2022-02-25 DIAGNOSIS — I5043 Acute on chronic combined systolic (congestive) and diastolic (congestive) heart failure: Secondary | ICD-10-CM | POA: Diagnosis not present

## 2022-02-25 DIAGNOSIS — N1831 Chronic kidney disease, stage 3a: Secondary | ICD-10-CM | POA: Diagnosis not present

## 2022-02-25 DIAGNOSIS — I480 Paroxysmal atrial fibrillation: Secondary | ICD-10-CM | POA: Diagnosis not present

## 2022-02-27 DIAGNOSIS — I480 Paroxysmal atrial fibrillation: Secondary | ICD-10-CM | POA: Diagnosis not present

## 2022-02-27 DIAGNOSIS — I5043 Acute on chronic combined systolic (congestive) and diastolic (congestive) heart failure: Secondary | ICD-10-CM | POA: Diagnosis not present

## 2022-02-27 DIAGNOSIS — I2119 ST elevation (STEMI) myocardial infarction involving other coronary artery of inferior wall: Secondary | ICD-10-CM | POA: Diagnosis not present

## 2022-02-27 DIAGNOSIS — N1831 Chronic kidney disease, stage 3a: Secondary | ICD-10-CM | POA: Diagnosis not present

## 2022-02-27 DIAGNOSIS — I130 Hypertensive heart and chronic kidney disease with heart failure and stage 1 through stage 4 chronic kidney disease, or unspecified chronic kidney disease: Secondary | ICD-10-CM | POA: Diagnosis not present

## 2022-02-27 DIAGNOSIS — I2510 Atherosclerotic heart disease of native coronary artery without angina pectoris: Secondary | ICD-10-CM | POA: Diagnosis not present

## 2022-03-07 DIAGNOSIS — I5043 Acute on chronic combined systolic (congestive) and diastolic (congestive) heart failure: Secondary | ICD-10-CM | POA: Diagnosis not present

## 2022-03-07 DIAGNOSIS — I2510 Atherosclerotic heart disease of native coronary artery without angina pectoris: Secondary | ICD-10-CM | POA: Diagnosis not present

## 2022-03-07 DIAGNOSIS — I130 Hypertensive heart and chronic kidney disease with heart failure and stage 1 through stage 4 chronic kidney disease, or unspecified chronic kidney disease: Secondary | ICD-10-CM | POA: Diagnosis not present

## 2022-03-07 DIAGNOSIS — N1831 Chronic kidney disease, stage 3a: Secondary | ICD-10-CM | POA: Diagnosis not present

## 2022-03-07 DIAGNOSIS — I480 Paroxysmal atrial fibrillation: Secondary | ICD-10-CM | POA: Diagnosis not present

## 2022-03-07 DIAGNOSIS — I2119 ST elevation (STEMI) myocardial infarction involving other coronary artery of inferior wall: Secondary | ICD-10-CM | POA: Diagnosis not present

## 2022-03-12 DIAGNOSIS — I2119 ST elevation (STEMI) myocardial infarction involving other coronary artery of inferior wall: Secondary | ICD-10-CM | POA: Diagnosis not present

## 2022-03-12 DIAGNOSIS — I2510 Atherosclerotic heart disease of native coronary artery without angina pectoris: Secondary | ICD-10-CM | POA: Diagnosis not present

## 2022-03-12 DIAGNOSIS — I5043 Acute on chronic combined systolic (congestive) and diastolic (congestive) heart failure: Secondary | ICD-10-CM | POA: Diagnosis not present

## 2022-03-12 DIAGNOSIS — I480 Paroxysmal atrial fibrillation: Secondary | ICD-10-CM | POA: Diagnosis not present

## 2022-03-12 DIAGNOSIS — I130 Hypertensive heart and chronic kidney disease with heart failure and stage 1 through stage 4 chronic kidney disease, or unspecified chronic kidney disease: Secondary | ICD-10-CM | POA: Diagnosis not present

## 2022-03-12 DIAGNOSIS — N1831 Chronic kidney disease, stage 3a: Secondary | ICD-10-CM | POA: Diagnosis not present

## 2022-03-14 ENCOUNTER — Telehealth: Payer: Self-pay

## 2022-03-14 NOTE — Telephone Encounter (Signed)
Attempted to contact patient to schedule a Palliative Care consult appointment. No answer left a message to return call.  

## 2022-03-19 ENCOUNTER — Encounter (HOSPITAL_COMMUNITY): Payer: Self-pay | Admitting: Cardiology

## 2022-03-19 ENCOUNTER — Ambulatory Visit (HOSPITAL_COMMUNITY)
Admission: RE | Admit: 2022-03-19 | Discharge: 2022-03-19 | Disposition: A | Discharge status: Home / Self Care | Payer: Medicare Other | Source: Ambulatory Visit | Attending: Cardiology | Admitting: Cardiology

## 2022-03-19 ENCOUNTER — Telehealth: Payer: Self-pay

## 2022-03-19 VITALS — BP 120/60 | HR 63 | Wt 215.8 lb

## 2022-03-19 DIAGNOSIS — I5022 Chronic systolic (congestive) heart failure: Secondary | ICD-10-CM

## 2022-03-19 DIAGNOSIS — Z79899 Other long term (current) drug therapy: Secondary | ICD-10-CM | POA: Insufficient documentation

## 2022-03-19 DIAGNOSIS — I130 Hypertensive heart and chronic kidney disease with heart failure and stage 1 through stage 4 chronic kidney disease, or unspecified chronic kidney disease: Secondary | ICD-10-CM | POA: Diagnosis not present

## 2022-03-19 DIAGNOSIS — J449 Chronic obstructive pulmonary disease, unspecified: Secondary | ICD-10-CM | POA: Diagnosis not present

## 2022-03-19 DIAGNOSIS — I429 Cardiomyopathy, unspecified: Secondary | ICD-10-CM | POA: Diagnosis not present

## 2022-03-19 DIAGNOSIS — I480 Paroxysmal atrial fibrillation: Secondary | ICD-10-CM | POA: Diagnosis not present

## 2022-03-19 DIAGNOSIS — N1831 Chronic kidney disease, stage 3a: Secondary | ICD-10-CM | POA: Insufficient documentation

## 2022-03-19 DIAGNOSIS — I5042 Chronic combined systolic (congestive) and diastolic (congestive) heart failure: Secondary | ICD-10-CM

## 2022-03-19 DIAGNOSIS — R42 Dizziness and giddiness: Secondary | ICD-10-CM | POA: Insufficient documentation

## 2022-03-19 DIAGNOSIS — I2510 Atherosclerotic heart disease of native coronary artery without angina pectoris: Secondary | ICD-10-CM | POA: Diagnosis not present

## 2022-03-19 DIAGNOSIS — I252 Old myocardial infarction: Secondary | ICD-10-CM | POA: Diagnosis not present

## 2022-03-19 DIAGNOSIS — N179 Acute kidney failure, unspecified: Secondary | ICD-10-CM | POA: Diagnosis not present

## 2022-03-19 LAB — COMPREHENSIVE METABOLIC PANEL
ALT: 29 U/L (ref 0–44)
AST: 30 U/L (ref 15–41)
Albumin: 3.8 g/dL (ref 3.5–5.0)
Alkaline Phosphatase: 69 U/L (ref 38–126)
Anion gap: 10 (ref 5–15)
BUN: 55 mg/dL — ABNORMAL HIGH (ref 8–23)
CO2: 24 mmol/L (ref 22–32)
Calcium: 9.3 mg/dL (ref 8.9–10.3)
Chloride: 104 mmol/L (ref 98–111)
Creatinine, Ser: 2.05 mg/dL — ABNORMAL HIGH (ref 0.44–1.00)
GFR, Estimated: 24 mL/min — ABNORMAL LOW (ref >60)
Glucose, Bld: 93 mg/dL (ref 70–99)
Potassium: 4.5 mmol/L (ref 3.5–5.1)
Sodium: 138 mmol/L (ref 135–145)
Total Bilirubin: 0.8 mg/dL (ref 0.3–1.2)
Total Protein: 6.6 g/dL (ref 6.5–8.1)

## 2022-03-19 LAB — LIPID PANEL
Cholesterol: 151 mg/dL (ref 0–200)
HDL: 56 mg/dL (ref >40)
LDL Cholesterol: 71 mg/dL (ref 0–99)
Total CHOL/HDL Ratio: 2.7 RATIO
Triglycerides: 122 mg/dL (ref <150)
VLDL: 24 mg/dL (ref 0–40)

## 2022-03-19 LAB — CBC
HCT: 33.3 % — ABNORMAL LOW (ref 36.0–46.0)
Hemoglobin: 10.8 g/dL — ABNORMAL LOW (ref 12.0–15.0)
MCH: 29.7 pg (ref 26.0–34.0)
MCHC: 32.4 g/dL (ref 30.0–36.0)
MCV: 91.5 fL (ref 80.0–100.0)
Platelets: 308 10*3/uL (ref 150–400)
RBC: 3.64 MIL/uL — ABNORMAL LOW (ref 3.87–5.11)
RDW: 13.4 % (ref 11.5–15.5)
WBC: 9.1 10*3/uL (ref 4.0–10.5)
nRBC: 0 % (ref 0.0–0.2)

## 2022-03-19 LAB — TSH: TSH: 2.309 u[IU]/mL (ref 0.350–4.500)

## 2022-03-19 MED ORDER — TORSEMIDE 20 MG PO TABS: 20.0000 mg | ORAL_TABLET | ORAL | 3 refills | Status: DC

## 2022-03-19 NOTE — Patient Instructions (Signed)
EKG done today.  Labs done today. We will contact you only if your labs are abnormal.  DECREASE Torsemide to '20mg'$  (1 tablet) by mouth every other day.   No other medication changes were made. Please continue all current medications as prescribed.  You have been referred to Cardiac Rehab. They will contact you to schedule an appointment.   Your physician recommends that you schedule a follow-up appointment soon for an echo and in 3 months with Dr. Aundra Dubin.  Your physician has requested that you have an echocardiogram. Echocardiography is a painless test that uses sound waves to create images of your heart. It provides your doctor with information about the size and shape of your heart and how well your heart's chambers and valves are working. This procedure takes approximately one hour. There are no restrictions for this procedure.  If you have any questions or concerns before your next appointment please send [place] a message through [place] or call our office at [phone].    TO LEAVE A MESSAGE FOR THE NURSE SELECT OPTION 2, PLEASE LEAVE A MESSAGE INCLUDING: YOUR NAME DATE OF BIRTH CALL BACK NUMBER REASON FOR CALL**this is important as we prioritize the call backs  YOU WILL RECEIVE A CALL BACK THE SAME DAY AS LONG AS YOU CALL BEFORE 4:00 PM   Do the following things EVERYDAY: Weigh yourself in the morning before breakfast. Write it down and keep it in a log. Take your medicines as prescribed Eat low salt foods--Limit salt (sodium) to 2000 mg per day.  Stay as active as you can everyday Limit all fluids for the day to less than 2 liters   At the [facility], you and your health needs are our priority. As part of our continuing mission to provide you with exceptional heart care, we have created designated Provider Care Teams. These Care Teams include your primary Cardiologist (physician) and Advanced Practice Providers (APPs- Physician Assistants and Nurse  Practitioners) who all work together to provide you with the care you need, when you need it.   You may see any of the following providers on your designated Care Team at your next follow up: Dr Glori Bickers Dr Haynes Kerns, NP Lyda Jester, [place] Audry Riles, PharmD   Please be sure to bring in all your medications bottles to every appointment.

## 2022-03-19 NOTE — Telephone Encounter (Signed)
Spoke with patient regarding Palliative Care services. She declined services at this time. Will cancel referral and notify referring provider.

## 2022-03-19 NOTE — Progress Notes (Signed)
PCP: Dr Gerarda Fraction Primary HF Cardiologist: Dr Aundra Dubin Nephrology:  [facility]    HPI: Brittany Jackson is a 81 y.o. female with history of chronic diastolic CHF, tobacco use, HTN, former smoker, and COPD.   Admitted 01/08/22 with inferolateral STEMI c/b acute systolic CHF with low-output. No intervention initially. She was seen by CT surgery, CABG not recommended.  Hospital course c/b AKI on CKD Stage IIIa, HAP, and leukocytosis. Briefly on CVVHD and had gradual improvement. Creatinine at d/c down to 2.0.  Developed rash felt to be in the setting of amiodarone. Treated with steroids and switched to amio without dye with improvement. Discharged on 01/22/22. She had paroxysmal atrial fibrillation during this admission.   She returns for followup of CHF, CAD, and atrial fibrillation.  She has lost 17 lbs since last appointment.  She was seen in the ER last month with atypical chest pain, workup was negative. She has not had recent chest pain.  She has seen a nephrologist.  She still has occasional lightheadedness with standing, no falls.  She remains on low dose midodrine.  She is not short of breath walking on flat ground for short distances or walking up a flight of stairs.  No orthopnea/PND.  No palpitations.  She is in NSR today.   ECG (personally reviewed): NSR, normal  Labs (7/23): K 4.6, creatinine 2.4, BNP 48  PMH: 1. Inflammatory dermatosis 2. HTN 3. CKD stage 3 4. COPD: Smoker until 5/23.  Obstructive PFTs in 5/23.  5. Atrial fibrillation: Paroxysmal.  6. Cardiomyopathy: Initially thought to be ischemic but suspect stress (Takotsubo-type) CMP given rapid improvement.  - Echo (5/23): EF 20-25%, mildly decreased RV systolic function, trivial MR, dilated IVC.  - Echo (5/23, repeat): improvement in EF to 65-70%  7. CAD: inferolateral STEMI by ECG 5/23 with HS-TnI in 3000 range, LHC with 3VD => 50-60% dLM, ?90% ostial LAD, 75% stenosis at bifurcation of mLAD and D, 75% stenosis at bifurcation of  mLCx and OM.  The ostial LAD is not completely laid out and on review, I am not sure that it is truly critical.   ROS: All systems negative except as listed in HPI, PMH and Problem List.  SH:  Social History   Socioeconomic History   Marital status: Widowed    Spouse name: Not on file   Number of children: Not on file   Years of education: Not on file   Highest education level: Not on file  Occupational History   Not on file  Tobacco Use   Smoking status: Every Day    Packs/day: 0.50    Types: Cigarettes   Smokeless tobacco: Never  Vaping Use   Vaping Use: Never used  Substance and Sexual Activity   Alcohol use: Yes    Comment: occasional   Drug use: No   Sexual activity: Never    Birth control/protection: Surgical  Other Topics Concern   Not on file  Social History Narrative   Not on file   Social Determinants of Health   Financial Resource Strain: Not on file  Food Insecurity: No Food Insecurity (01/22/2022)   Hunger Vital Sign    Worried About Running Out of Food in the Last Year: Never true    Ran Out of Food in the Last Year: Never true  Transportation Needs: No Transportation Needs (01/22/2022)   PRAPARE - Hydrologist (Medical): No    Lack of Transportation (Non-Medical): No  Physical Activity: Not on  file  Stress: Not on file  Social Connections: Not on file  Intimate Partner Violence: Not on file    FH:  Family History  Problem Relation Age of Onset   Hypertension Sister    Allergic rhinitis Neg Hx    Asthma Neg Hx    Angioedema Neg Hx    Atopy Neg Hx    Eczema Neg Hx    Immunodeficiency Neg Hx    Urticaria Neg Hx     Past Medical History:  Diagnosis Date   CHF (congestive heart failure) (HCC)    Hypertension    Urticaria     Current Outpatient Medications  Medication Sig Dispense Refill   albuterol (PROVENTIL) 4 MG tablet Take 2 mg by mouth 3 (three) times daily. As needed     amiodarone (PACERONE) 200 MG  tablet Take 1 tablet (200 mg total) by mouth daily. 30 tablet 3   apixaban (ELIQUIS) 2.5 MG TABS tablet Take 1 tablet (2.5 mg total) by mouth 2 (two) times daily. 60 tablet 6   Ascorbic Acid (VITAMIN C) 1000 MG tablet Take 1,000 mg by mouth daily.     atorvastatin (LIPITOR) 80 MG tablet Take 1 tablet (80 mg total) by mouth daily. 30 tablet 6   diazepam (VALIUM) 10 MG tablet Take 10 mg by mouth 3 (three) times daily as needed for anxiety.     hydrocortisone cream 1 % Apply topically as needed for itching. 28 g 0   midodrine (PROAMATINE) 2.5 MG tablet Take 1 tablet (2.5 mg total) by mouth 3 (three) times daily with meals. 90 tablet 1   Multiple Vitamins-Minerals (MULTI FOR HER 50+) TABS Take 1 tablet by mouth daily.     Red Yeast Rice 600 MG CAPS Take 600 mg by mouth daily.     zinc gluconate 50 MG tablet Take 50 mg by mouth daily.     torsemide (DEMADEX) 20 MG tablet Take 1 tablet (20 mg total) by mouth every other day. 45 tablet 3   No current facility-administered medications for this encounter.    Vitals:   03/19/22 1538  BP: 120/60  Pulse: 63  SpO2: 96%  Weight: 97.9 kg (215 lb 12.8 oz)   Wt Readings from Last 3 Encounters:  03/19/22 97.9 kg (215 lb 12.8 oz)  02/20/22 98.4 kg (217 lb)  01/31/22 105.5 kg (232 lb 8 oz)    PHYSICAL EXAM: General: NAD Neck: No JVD, no thyromegaly or thyroid nodule.  Lungs: Clear to auscultation bilaterally with normal respiratory effort. CV: Nondisplaced PMI.  Heart regular S1/S2, no S3/S4, no murmur.  No peripheral edema.  No carotid bruit.  Normal pedal pulses.  Abdomen: Soft, nontender, no hepatosplenomegaly, no distention.  Skin: Intact without lesions or rashes.  Neurologic: Alert and oriented x 3.  Psych: Normal affect. Extremities: No clubbing or cyanosis.  HEENT: Normal.   ASSESS 1. CAD: Patient presented with ACS in 5/23, peak HS-TnI 3557. LHC with 3VD => 50-60% dLM, ?90% ostial LAD, 75% stenosis at bifurcation of mLAD and D, 75%  stenosis at bifurcation of mLCx and OM.  The ostial LAD is not completely laid out and on review, not sure that it is truly critical.  CABG would be a consideration if LAD disease is severe, may require FFR to determine this. PCI distal left main into LAD also an option if FFR shows severe disease.  She has had no ischemic chest pain or dyspnea and EF had recovered on last echo. Think we  can hold off on evaluation for now, especially as creatinine remains elevated (last creatinine was 2.4).  - Not on ASA given apixaban use.     - Continue high intensity statin. atorvastatin 80 daily.  Check lipids today.  - When she has stabilized medically and creatinine has come down, reasonable to repeat cath with FFR LM into LAD to assess significance of distal LM/ostial LAD disease. BMET today.  - Refer for cardiac rehab.  2. Cardiomyopathy: Possible stress (Takotsubo-type) cardiomyopathy. Patient has CAD as above, but not sure it explains the extent of her initial cardiomyopathy in 5/23 and her rapid improvement.  Low output on initial RHC in 5/23 with CI 1.8.  Echo in 5/23 initially with EF 20-25%, mildly decreased RV systolic function, trivial MR, dilated IVC. Repeat echo later in 5/23 with improvement in EF to 65-70% => suspect Takotsubo CMP.  NYHA class II symptoms.  She is not volume overloaded.  - I think she can decrease torsemide to 20 mg every other day.  - She still has episodes of lightheadedness, continue midodrine for now but stop in future if creatinine trends down and she remains stable.  - Repeat echo to make sure EF remains normal.  3.  Atrial fibrillation: Paroxysmal.  She is in NSR.  - Continue eliquis.   - Continue amiodarone.  Check LFTs/TSH today, will need regular eye exam.  4.  CKD Stage 3: AKI in 5/23 with transient CVVH.  Last creatinine 2.4.  - check BMET today  - f/u with nephrology.  5. COPD: Patient has quit smoking.  - She has followup with pulmonary.   Followup 3 months.    Loralie Champagne. 03/19/2022

## 2022-03-20 DIAGNOSIS — N1831 Chronic kidney disease, stage 3a: Secondary | ICD-10-CM | POA: Diagnosis not present

## 2022-03-20 DIAGNOSIS — I2510 Atherosclerotic heart disease of native coronary artery without angina pectoris: Secondary | ICD-10-CM | POA: Diagnosis not present

## 2022-03-20 DIAGNOSIS — I480 Paroxysmal atrial fibrillation: Secondary | ICD-10-CM | POA: Diagnosis not present

## 2022-03-20 DIAGNOSIS — I5043 Acute on chronic combined systolic (congestive) and diastolic (congestive) heart failure: Secondary | ICD-10-CM | POA: Diagnosis not present

## 2022-03-20 DIAGNOSIS — I130 Hypertensive heart and chronic kidney disease with heart failure and stage 1 through stage 4 chronic kidney disease, or unspecified chronic kidney disease: Secondary | ICD-10-CM | POA: Diagnosis not present

## 2022-03-20 DIAGNOSIS — I2119 ST elevation (STEMI) myocardial infarction involving other coronary artery of inferior wall: Secondary | ICD-10-CM | POA: Diagnosis not present

## 2022-03-21 ENCOUNTER — Encounter (HOSPITAL_COMMUNITY): Payer: Self-pay

## 2022-03-23 ENCOUNTER — Other Ambulatory Visit (HOSPITAL_COMMUNITY): Payer: Self-pay | Admitting: Adult Health

## 2022-03-28 ENCOUNTER — Ambulatory Visit (HOSPITAL_COMMUNITY)
Admission: RE | Admit: 2022-03-28 | Discharge: 2022-03-28 | Disposition: A | Discharge status: Home / Self Care | Payer: Medicare Other | Source: Ambulatory Visit | Attending: Internal Medicine | Admitting: Internal Medicine

## 2022-03-28 DIAGNOSIS — I5042 Chronic combined systolic (congestive) and diastolic (congestive) heart failure: Secondary | ICD-10-CM | POA: Insufficient documentation

## 2022-03-29 LAB — ECHOCARDIOGRAM COMPLETE
AR max vel: 3.1 cm2
AV Peak grad: 9.8 mmHg
Ao pk vel: 1.57 m/s
Area-P 1/2: 1.82 cm2
S' Lateral: 1.95 cm

## 2022-04-01 ENCOUNTER — Telehealth (HOSPITAL_COMMUNITY): Payer: Self-pay

## 2022-04-01 NOTE — Telephone Encounter (Addendum)
Pt aware, agreeable, and verbalized understanding   ----- Message from Larey Dresser, MD sent at 03/29/2022  1:47 PM EDT ----- Good news, normal LV and RV function.

## 2022-04-11 ENCOUNTER — Telehealth (HOSPITAL_COMMUNITY): Payer: Self-pay | Admitting: Internal Medicine

## 2022-04-17 DIAGNOSIS — N184 Chronic kidney disease, stage 4 (severe): Secondary | ICD-10-CM | POA: Diagnosis not present

## 2022-04-17 DIAGNOSIS — N2581 Secondary hyperparathyroidism of renal origin: Secondary | ICD-10-CM | POA: Diagnosis not present

## 2022-04-17 DIAGNOSIS — I509 Heart failure, unspecified: Secondary | ICD-10-CM | POA: Diagnosis not present

## 2022-04-17 DIAGNOSIS — I129 Hypertensive chronic kidney disease with stage 1 through stage 4 chronic kidney disease, or unspecified chronic kidney disease: Secondary | ICD-10-CM | POA: Diagnosis not present

## 2022-04-17 DIAGNOSIS — D631 Anemia in chronic kidney disease: Secondary | ICD-10-CM | POA: Diagnosis not present

## 2022-04-22 NOTE — Progress Notes (Signed)
Synopsis: Referred in 04/2022 for COPD. She has a background of chronic systolic heart failure and atrial fibrillation followed by Dr. Aundra Dubin.  STEMI in 9/5, was on CRRT for several days.   Subjective:   PATIENT ID: Brittany Jackson GENDER: female DOB: July 11, 1941, MRN: [id number]   HPI  Chief Complaint  Patient presents with   Consult    Pt had a recent CT performed. States that she does have complaints of SOB with exertion and has an occasional cough.    Brittany Jackson is here to see me for dyspnea and wheezing: > she says that she was hospitalized in May 2023 for CHF after she was outside working hard in the yard > later that night she was short of breath and she called 911 and was hospitalized for 13 days, requiring part of her stay in the ICU > she had recovery of her systolic function > she wants to go to cardiac rehab, but she hasn't been able to get in for that  She says that she still has some dyspnea: > she can wash dishes, and she can load the dishwasher > she still gets some dyspnea with heavy activity like lifting 15-20 pounds (like a pan of water from her dehumidifier) > she will wobble when she walks so she feels a little unsteady > she will get dyspneic on level ground and can't keep up with other > she can climb the stairs in her home without difficulty > can't catch her breath   She has some cough productive of "cigarette crap".  > she says that this is phlegm, dark colored, usually light colored > when she first quit smoking the mucus is darkest   Goal for her is to walk a mile.    She is accompanied by her son Gaspar Bidding.    81 y/o female presented to the ER 12/2021 with dyspnea, noted ot have inferolateral STEMI so went ot the cath lab where she was found to have:    Mid LM to Dist LM lesion is 60% stenosed.   Dist LM to Ost LAD lesion is 90% stenosed.   Mid LAD lesion is 60% stenosed with 75% stenosed side branch in 1st Diag.   Prox Cx lesion is 70% stenosed with 75%  stenosed side branch in LPAV.   Mid RCA-2 lesion is 80% stenosed.   Hemodynamic findings consistent with SEVERE PULMONARY HYPERTENSION.   There is no aortic valve stenosis.   There is moderate (3+) mitral regurgitation. RHC findings: PAP-mean 55/35-46 mmHg, RVP-RVEDP 54/29-17 million mercury, RAP 18 mmHg; PCWP 37 mmHg with LVP-EDP 126/48-36 mmHg; AoP-MAP 113/66-80 mmHg Treated with CRRT from 5/29-5/31, had HCAP treated with cefepime, atrial fibrillation treated with amiodarone.  Initial LVEF 25% but recovered to 65-70% after several days, came off CRRT.  PCCM consulted in the setting of an abnormal CT chest showing patchy GGO and small bilateral pleural effusions, atelectasis LLL.   Past Medical History:  Diagnosis Date   CHF (congestive heart failure) (HCC)    Hypertension    Urticaria      Family History  Problem Relation Age of Onset   Hypertension Sister    Allergic rhinitis Neg Hx    Asthma Neg Hx    Angioedema Neg Hx    Atopy Neg Hx    Eczema Neg Hx    Immunodeficiency Neg Hx    Urticaria Neg Hx      Social History   Socioeconomic History   Marital status: Widowed  Spouse name: Not on file   Number of children: Not on file   Years of education: Not on file   Highest education level: Not on file  Occupational History   Not on file  Tobacco Use   Smoking status: Former    Packs/day: 1.00    Years: 60.00    Total pack years: 60.00    Types: Cigarettes    Start date: 25    Quit date: 01/08/2022    Years since quitting: 0.2   Smokeless tobacco: Never  Vaping Use   Vaping Use: Never used  Substance and Sexual Activity   Alcohol use: Yes    Comment: occasional   Drug use: No   Sexual activity: Never    Birth control/protection: Surgical  Other Topics Concern   Not on file  Social History Narrative   Not on file   Social Determinants of Health   Financial Resource Strain: Not on file  Food Insecurity: No Food Insecurity (01/22/2022)   Hunger Vital Sign     Worried About Running Out of Food in the Last Year: Never true    Ran Out of Food in the Last Year: Never true  Transportation Needs: No Transportation Needs (01/22/2022)   PRAPARE - Hydrologist (Medical): No    Lack of Transportation (Non-Medical): No  Physical Activity: Not on file  Stress: Not on file  Social Connections: Not on file  Intimate Partner Violence: Not on file     Allergies  Allergen Reactions   Chlorhexidine Other (See Comments)    Burns skin immediately   Codeine Other (See Comments)    Unknown   Meprobamate Swelling   Sulfa Antibiotics Other (See Comments)    Unknown     Outpatient Medications Prior to Visit  Medication Sig Dispense Refill   amiodarone (PACERONE) 200 MG tablet Take 1 tablet (200 mg total) by mouth daily. 30 tablet 3   apixaban (ELIQUIS) 2.5 MG TABS tablet Take 1 tablet (2.5 mg total) by mouth 2 (two) times daily. 60 tablet 6   Ascorbic Acid (VITAMIN C) 1000 MG tablet Take 1,000 mg by mouth daily.     atorvastatin (LIPITOR) 80 MG tablet Take 1 tablet (80 mg total) by mouth daily. 30 tablet 6   diazepam (VALIUM) 10 MG tablet Take 10 mg by mouth 3 (three) times daily as needed for anxiety.     hydrocortisone cream 1 % Apply topically as needed for itching. 28 g 0   midodrine (PROAMATINE) 2.5 MG tablet TAKE ONE TABLET BY MOUTH THREE TIMES DAILY WITH MEALS 90 tablet 6   Multiple Vitamins-Minerals (MULTI FOR HER 50+) TABS Take 1 tablet by mouth daily.     Red Yeast Rice 600 MG CAPS Take 600 mg by mouth daily.     torsemide (DEMADEX) 20 MG tablet Take 1 tablet (20 mg total) by mouth every other day. 45 tablet 3   albuterol (PROVENTIL) 4 MG tablet Take 2 mg by mouth 3 (three) times daily. As needed     zinc gluconate 50 MG tablet Take 50 mg by mouth daily.     No facility-administered medications prior to visit.    ROS Gen: Denies fever, chills, weight change, fatigue, night sweats HEENT: Denies blurred vision,  double vision, hearing loss, tinnitus, sinus congestion, rhinorrhea, sore throat, neck stiffness, dysphagia PULM: per HPI CV: Denies chest pain, edema, orthopnea, paroxysmal nocturnal dyspnea, palpitations GI: Denies abdominal pain, nausea, vomiting, diarrhea, hematochezia, melena,  constipation, change in bowel habits GU: Denies dysuria, hematuria, polyuria, oliguria, urethral discharge Endocrine: Denies hot or cold intolerance, polyuria, polyphagia or appetite change Derm: Denies rash, dry skin, scaling or peeling skin change Heme: Denies easy bruising, bleeding, bleeding gums Neuro: Denies headache, numbness, weakness, slurred speech, loss of memory or consciousness    Objective:  Physical Exam   Vitals:   04/23/22 1333  BP: 104/62  Pulse: (!) 57  SpO2: 98%  Weight: 228 lb 6.4 oz (103.6 kg)  Height: '5\' 6"'$  (1.676 m)    Gen: well appearing, no acute distress HENT: NCAT, OP clear, neck supple without masses Eyes: PERRL, EOMi Lymph: no cervical lymphadenopathy PULM: Rhonchi LLL, otherwise CTA B CV: RRR, no mgr, no JVD GI: BS+, soft, nontender, no hsm Derm: no rash or skin breakdown MSK: normal bulk and tone Neuro: A&Ox4, CN II-XII intact, strength 5/5 in all 4 extremities Psyche: normal mood and affect   CBC    Component Value Date/Time   WBC 9.1 03/19/2022 1520   RBC 3.64 (L) 03/19/2022 1520   HGB 10.8 (L) 03/19/2022 1520   HCT 33.3 (L) 03/19/2022 1520   PLT 308 03/19/2022 1520   MCV 91.5 03/19/2022 1520   MCH 29.7 03/19/2022 1520   MCHC 32.4 03/19/2022 1520   RDW 13.4 03/19/2022 1520   LYMPHSABS 1.7 02/20/2022 1605   MONOABS 0.7 02/20/2022 1605   EOSABS 0.3 02/20/2022 1605   BASOSABS 0.0 02/20/2022 1605     Chest imaging: June 2023 CT chest images independently reviewed showing 2 nodules in the right upper lobe, groundglass, trace pleural effusions left greater than right, atelectasis left lower lobe, cardiomegaly.   PFT: May 2023 spirometry ratio 65%,  FEV1 1.09 L 53% predicted  Labs: July 2023 hemoglobin 10.3 g/dL, 300 eosinophils per microliter  Path:  Echo: 5.2023 TTE> LVEF 20-25% August 2023 TTE LVEF 65 to 70%, RV size and function okay, valves okay  Heart Catheterization: 12/2021 :    Mid LM to Dist LM lesion is 60% stenosed.   Dist LM to Ost LAD lesion is 90% stenosed.   Mid LAD lesion is 60% stenosed with 75% stenosed side branch in 1st Diag.   Prox Cx lesion is 70% stenosed with 75% stenosed side branch in LPAV.   Mid RCA-2 lesion is 80% stenosed.   Hemodynamic findings consistent with SEVERE PULMONARY HYPERTENSION.   There is no aortic valve stenosis.   There is moderate (3+) mitral regurgitation. RHC findings: PAP-mean 55/35-46 mmHg, RVP-RVEDP 54/29-17 million mercury, RAP 18 mmHg; PCWP 37 mmHg with LVP-EDP 126/48-36 mmHg; AoP-MAP 113/66-80 mmHg     Assessment & Plan:   Abnormal CT scan - Plan: CT Chest Wo Contrast  Mucopurulent chronic bronchitis (HCC) - Plan: AMB referral to pulmonary rehabilitation  Tobacco abuse  Dyspnea, unspecified type - Plan: AMB referral to pulmonary rehabilitation  Discussion: Brittany Jackson returns to clinic today to see me for the first time in 3 months.  I last saw her during her hospitalization in May through June 2023.  Since then she has had persistent cough with mucus production consistent with mucopurulent bronchitis and some dyspnea on exertion.  She has COPD, Gold grade E.  Plan: COPD Gold grade E: Practice good hand hygiene, stay active Please review your immunization records with your primary care doctor: You need to have pneumonia vaccines and annual flu shot We will refer you to pulmonary rehab Start taking Anoro 1 puff daily no matter how you feel Use albuterol 2 puffs  every 4-6 hours as needed for chest tightness wheezing or shortness of breath  Abnormal CT scan seen while hospitalized: I think this was mostly due to heart failure but we need to repeat it to make sure things  have cleared up Repeat CT chest now  Shortness of breath, physical deconditioning: Referral to pulmonary rehab  Follow-up with me in 3 months or sooner if needed.   Immunizations: Immunization History  Administered Date(s) Administered   Influenza, High Dose Seasonal PF 07/29/2019   PFIZER(Purple Top)SARS-COV-2 Vaccination 09/07/2019, 09/28/2019, 08/04/2020     Current Outpatient Medications:    albuterol (VENTOLIN HFA) 108 (90 Base) MCG/ACT inhaler, Inhale 2 puffs into the lungs every 6 (six) hours as needed for wheezing or shortness of breath., Disp: 8 g, Rfl: 6   amiodarone (PACERONE) 200 MG tablet, Take 1 tablet (200 mg total) by mouth daily., Disp: 30 tablet, Rfl: 3   apixaban (ELIQUIS) 2.5 MG TABS tablet, Take 1 tablet (2.5 mg total) by mouth 2 (two) times daily., Disp: 60 tablet, Rfl: 6   Ascorbic Acid (VITAMIN C) 1000 MG tablet, Take 1,000 mg by mouth daily., Disp: , Rfl:    atorvastatin (LIPITOR) 80 MG tablet, Take 1 tablet (80 mg total) by mouth daily., Disp: 30 tablet, Rfl: 6   diazepam (VALIUM) 10 MG tablet, Take 10 mg by mouth 3 (three) times daily as needed for anxiety., Disp: , Rfl:    hydrocortisone cream 1 %, Apply topically as needed for itching., Disp: 28 g, Rfl: 0   midodrine (PROAMATINE) 2.5 MG tablet, TAKE ONE TABLET BY MOUTH THREE TIMES DAILY WITH MEALS, Disp: 90 tablet, Rfl: 6   Multiple Vitamins-Minerals (MULTI FOR HER 50+) TABS, Take 1 tablet by mouth daily., Disp: , Rfl:    Red Yeast Rice 600 MG CAPS, Take 600 mg by mouth daily., Disp: , Rfl:    torsemide (DEMADEX) 20 MG tablet, Take 1 tablet (20 mg total) by mouth every other day., Disp: 45 tablet, Rfl: 3   umeclidinium-vilanterol (ANORO ELLIPTA) 62.5-25 MCG/ACT AEPB, Inhale 1 puff into the lungs daily., Disp: 60 each, Rfl: 5

## 2022-04-23 ENCOUNTER — Encounter: Payer: Self-pay | Admitting: Pulmonary Disease

## 2022-04-23 ENCOUNTER — Ambulatory Visit (INDEPENDENT_AMBULATORY_CARE_PROVIDER_SITE_OTHER): Payer: Medicare Other | Admitting: Pulmonary Disease

## 2022-04-23 VITALS — BP 104/62 | HR 57 | Ht 66.0 in | Wt 228.4 lb

## 2022-04-23 DIAGNOSIS — I2101 ST elevation (STEMI) myocardial infarction involving left main coronary artery: Secondary | ICD-10-CM

## 2022-04-23 DIAGNOSIS — R0600 Dyspnea, unspecified: Secondary | ICD-10-CM

## 2022-04-23 DIAGNOSIS — R9389 Abnormal findings on diagnostic imaging of other specified body structures: Secondary | ICD-10-CM | POA: Diagnosis not present

## 2022-04-23 DIAGNOSIS — Z720 Tobacco use: Secondary | ICD-10-CM

## 2022-04-23 DIAGNOSIS — J411 Mucopurulent chronic bronchitis: Secondary | ICD-10-CM | POA: Diagnosis not present

## 2022-04-23 MED ORDER — ANORO ELLIPTA 62.5-25 MCG/ACT IN AEPB: 1.0000 | INHALATION_SPRAY | Freq: Every day | RESPIRATORY_TRACT | 5 refills | Status: DC

## 2022-04-23 MED ORDER — ALBUTEROL SULFATE HFA 108 (90 BASE) MCG/ACT IN AERS: 2.0000 | INHALATION_SPRAY | Freq: Four times a day (QID) | RESPIRATORY_TRACT | 6 refills | Status: AC | PRN

## 2022-04-23 NOTE — Patient Instructions (Signed)
COPD Gold grade E: Practice good hand hygiene, stay active Please review your immunization records with your primary care doctor: You need to have pneumonia vaccines and annual flu shot We will refer you to pulmonary rehab Start taking Anoro 1 puff daily no matter how you feel Use albuterol 2 puffs every 4-6 hours as needed for chest tightness wheezing or shortness of breath  Abnormal CT scan seen while hospitalized: I think this was mostly due to heart failure but we need to repeat it to make sure things have cleared up Repeat CT chest now  Shortness of breath, physical deconditioning: Referral to pulmonary rehab  Follow-up with me in 3 months or sooner if needed.

## 2022-04-24 ENCOUNTER — Telehealth (HOSPITAL_COMMUNITY): Payer: Self-pay

## 2022-04-24 NOTE — Telephone Encounter (Signed)
Surgical clearance form faxed on 04-24-2022 at 1.45pm

## 2022-05-16 ENCOUNTER — Other Ambulatory Visit: Payer: Medicare Other

## 2022-05-16 ENCOUNTER — Ambulatory Visit
Admission: RE | Admit: 2022-05-16 | Discharge: 2022-05-16 | Disposition: A | Discharge status: Home / Self Care | Payer: Medicare Other | Source: Ambulatory Visit | Attending: Pulmonary Disease | Admitting: Pulmonary Disease

## 2022-05-16 DIAGNOSIS — R9389 Abnormal findings on diagnostic imaging of other specified body structures: Secondary | ICD-10-CM

## 2022-05-16 DIAGNOSIS — J439 Emphysema, unspecified: Secondary | ICD-10-CM | POA: Diagnosis not present

## 2022-05-16 DIAGNOSIS — I700 Atherosclerosis of aorta: Secondary | ICD-10-CM | POA: Diagnosis not present

## 2022-05-27 ENCOUNTER — Other Ambulatory Visit (HOSPITAL_COMMUNITY): Payer: Self-pay | Admitting: Cardiology

## 2022-05-27 ENCOUNTER — Telehealth: Payer: Self-pay | Admitting: Pulmonary Disease

## 2022-05-27 NOTE — Telephone Encounter (Signed)
Called and spoke to patient about CT results. Patient verbalized understanding. Nothing further needed

## 2022-06-12 ENCOUNTER — Telehealth: Payer: Self-pay | Admitting: *Deleted

## 2022-06-12 NOTE — Telephone Encounter (Signed)
Noted  

## 2022-06-17 ENCOUNTER — Encounter (HOSPITAL_COMMUNITY): Payer: Self-pay | Admitting: Cardiology

## 2022-06-17 ENCOUNTER — Ambulatory Visit (HOSPITAL_COMMUNITY)
Admission: RE | Admit: 2022-06-17 | Discharge: 2022-06-17 | Disposition: A | Discharge status: Home / Self Care | Payer: Medicare Other | Source: Ambulatory Visit | Attending: Cardiology | Admitting: Cardiology

## 2022-06-17 ENCOUNTER — Other Ambulatory Visit (HOSPITAL_COMMUNITY): Payer: Self-pay

## 2022-06-17 ENCOUNTER — Telehealth (HOSPITAL_COMMUNITY): Payer: Self-pay

## 2022-06-17 VITALS — BP 120/70 | HR 54 | Wt 237.6 lb

## 2022-06-17 DIAGNOSIS — I5041 Acute combined systolic (congestive) and diastolic (congestive) heart failure: Secondary | ICD-10-CM | POA: Insufficient documentation

## 2022-06-17 DIAGNOSIS — I5032 Chronic diastolic (congestive) heart failure: Secondary | ICD-10-CM

## 2022-06-17 DIAGNOSIS — I480 Paroxysmal atrial fibrillation: Secondary | ICD-10-CM

## 2022-06-17 LAB — COMPREHENSIVE METABOLIC PANEL
ALT: 16 U/L (ref 0–44)
AST: 24 U/L (ref 15–41)
Albumin: 3.8 g/dL (ref 3.5–5.0)
Alkaline Phosphatase: 86 U/L (ref 38–126)
Anion gap: 11 (ref 5–15)
BUN: 37 mg/dL — ABNORMAL HIGH (ref 8–23)
CO2: 28 mmol/L (ref 22–32)
Calcium: 9.4 mg/dL (ref 8.9–10.3)
Chloride: 102 mmol/L (ref 98–111)
Creatinine, Ser: 2.23 mg/dL — ABNORMAL HIGH (ref 0.44–1.00)
GFR, Estimated: 21 mL/min — ABNORMAL LOW (ref >60)
Glucose, Bld: 83 mg/dL (ref 70–99)
Potassium: 5.1 mmol/L (ref 3.5–5.1)
Sodium: 141 mmol/L (ref 135–145)
Total Bilirubin: 0.6 mg/dL (ref 0.3–1.2)
Total Protein: 6.8 g/dL (ref 6.5–8.1)

## 2022-06-17 LAB — BRAIN NATRIURETIC PEPTIDE: B Natriuretic Peptide: 236.4 pg/mL — ABNORMAL HIGH (ref 0.0–100.0)

## 2022-06-17 LAB — TSH: TSH: 2.899 u[IU]/mL (ref 0.350–4.500)

## 2022-06-17 MED ORDER — EMPAGLIFLOZIN 10 MG PO TABS: 10.0000 mg | ORAL_TABLET | Freq: Every day | ORAL | 11 refills | Status: DC

## 2022-06-17 MED ORDER — AMIODARONE HCL 200 MG PO TABS: 100.0000 mg | ORAL_TABLET | Freq: Every day | ORAL | 3 refills | Status: DC

## 2022-06-17 NOTE — Telephone Encounter (Signed)
Advanced Heart Failure Patient Advocate Encounter  Patient is being placed on Jardiance/Farxiga. I am unable to find current active insurance coverage for this patient. [employer] for processing information, but this is also listed as inactive. 30 day supply of Jardiance was sent in along with trial card.  Patient may be eligible for manufacturer assistance. Attempted to reach patient to confirm household size and income. No answer, left voicemail.  Will follow up.  Clista Bernhardt, CPhT Rx Patient Advocate Phone: [phone]

## 2022-06-17 NOTE — Patient Instructions (Addendum)
MAKE SURE YOU ARE TAKING YOUR TORSEMIDE EVERY  OTHER DAY.  DECREASE Amiodarone to 100 mg ( 1/2 Tab) daily   START Jardiance 10 mg daily   STOP Midodrine   Labs done today, your results will be available in MyChart, we will contact you for abnormal readings.  Repeat blood work in 10 days.    Your physician recommends that you schedule a follow-up appointment in: 4 months.  If you have any questions or concerns before your next appointment please send [place] a message through [place] or call our office at [phone].    TO LEAVE A MESSAGE FOR THE NURSE SELECT OPTION 2, PLEASE LEAVE A MESSAGE INCLUDING: YOUR NAME DATE OF BIRTH CALL BACK NUMBER REASON FOR CALL**this is important as we prioritize the call backs  YOU WILL RECEIVE A CALL BACK THE SAME DAY AS LONG AS YOU CALL BEFORE 4:00 PM  At the [facility], you and your health needs are our priority. As part of our continuing mission to provide you with exceptional heart care, we have created designated Provider Care Teams. These Care Teams include your primary Cardiologist (physician) and Advanced Practice Providers (APPs- Physician Assistants and Nurse Practitioners) who all work together to provide you with the care you need, when you need it.   You may see any of the following providers on your designated Care Team at your next follow up: Dr Glori Bickers Dr Loralie Champagne Dr. Roxana Hires, NP Lyda Jester, [place] [facility] [place], [place] Forestine Na, NP Audry Riles, PharmD   Please be sure to bring in all your medications bottles to every appointment.

## 2022-06-18 ENCOUNTER — Encounter (HOSPITAL_COMMUNITY): Payer: 59 | Admitting: Cardiology

## 2022-06-18 ENCOUNTER — Telehealth (HOSPITAL_COMMUNITY): Payer: Self-pay

## 2022-06-18 NOTE — Telephone Encounter (Signed)
-----   Message from Larey Dresser, MD sent at 06/18/2022  3:11 PM EDT ----- No change to plan, BMET in 10 days.

## 2022-06-18 NOTE — Progress Notes (Signed)
PCP: Dr Gerarda Fraction Primary HF Cardiologist: Dr Aundra Dubin Nephrology:  [facility]    HPI: Brittany Jackson is a 81 y.o. female with history of chronic diastolic CHF, tobacco use, HTN, former smoker, and COPD.   Admitted 01/08/22 with inferolateral STEMI c/b acute systolic CHF with low-output. No intervention initially. She was seen by CT surgery, CABG not recommended.  Hospital course c/b AKI on CKD Stage IIIa, HAP, and leukocytosis. Briefly on CVVHD and had gradual improvement. Creatinine at d/c down to 2.0.  Developed rash felt to be in the setting of amiodarone. Treated with steroids and switched to amio without dye with improvement. Discharged on 01/22/22. She had paroxysmal atrial fibrillation during this admission.   Echo in 8/23 showed EF 65-70%, normal RV, mild MR, IVC normal. CT chest in 9/23 showed emphysema.   She returns for followup of CHF, CAD, and atrial fibrillation.  Weight is up, she reports improved appetite.  She quit smoking and is staying off cigarettes.  She is taking torsemide 20 mg once daily.  No dyspnea walking on flat ground.  No problems with ADLs.  No chest pain.  No lightheadedness or palpitations.  She is in NSR today.   ECG (personally reviewed): NSR, 1st degree AVB  Labs (7/23): K 4.6, creatinine 2.4, BNP 48 Labs (8/23): LDL 71, HDL 56, hgb 10.8, TSH normal, K 4.5, creatinine 2.05  PMH: 1. Inflammatory dermatosis 2. HTN 3. CKD stage 3 4. COPD: Smoker until 5/23.  Obstructive PFTs in 5/23.  - CT chest (9/23) with emphysema.  5. Atrial fibrillation: Paroxysmal.  6. Cardiomyopathy: Initially thought to be ischemic but suspect stress (Takotsubo-type) CMP given rapid improvement.  - Echo (5/23): EF 20-25%, mildly decreased RV systolic function, trivial MR, dilated IVC.  - Echo (5/23, repeat): improvement in EF to 65-70%  - Echo (8/23): EF 65-70%, normal RV, mild MR, IVC normal. 7. CAD: inferolateral STEMI by ECG 5/23 with HS-TnI in 3000 range, LHC with 3VD => 50-60% dLM,  ?90% ostial LAD, 75% stenosis at bifurcation of mLAD and D, 75% stenosis at bifurcation of mLCx and OM.  The ostial LAD is not completely laid out and on review, I am not sure that it is truly critical.   ROS: All systems negative except as listed in HPI, PMH and Problem List.  SH:  Social History   Socioeconomic History   Marital status: Widowed    Spouse name: Not on file   Number of children: Not on file   Years of education: Not on file   Highest education level: Not on file  Occupational History   Not on file  Tobacco Use   Smoking status: Former    Packs/day: 1.00    Years: 60.00    Total pack years: 60.00    Types: Cigarettes    Start date: 9    Quit date: 01/08/2022    Years since quitting: 0.4   Smokeless tobacco: Never  Vaping Use   Vaping Use: Never used  Substance and Sexual Activity   Alcohol use: Yes    Comment: occasional   Drug use: No   Sexual activity: Never    Birth control/protection: Surgical  Other Topics Concern   Not on file  Social History Narrative   Not on file   Social Determinants of Health   Financial Resource Strain: Not on file  Food Insecurity: No Food Insecurity (01/22/2022)   Hunger Vital Sign    Worried About Running Out of Food in the Last  Year: Never true    [place] in the Last Year: Never true  Transportation Needs: No Transportation Needs (01/22/2022)   PRAPARE - Hydrologist (Medical): No    Lack of Transportation (Non-Medical): No  Physical Activity: Not on file  Stress: Not on file  Social Connections: Not on file  Intimate Partner Violence: Not on file    FH:  Family History  Problem Relation Age of Onset   Hypertension Sister    Allergic rhinitis Neg Hx    Asthma Neg Hx    Angioedema Neg Hx    Atopy Neg Hx    Eczema Neg Hx    Immunodeficiency Neg Hx    Urticaria Neg Hx     Past Medical History:  Diagnosis Date   CHF (congestive heart failure) (HCC)    Hypertension     Urticaria     Current Outpatient Medications  Medication Sig Dispense Refill   albuterol (VENTOLIN HFA) 108 (90 Base) MCG/ACT inhaler Inhale 2 puffs into the lungs every 6 (six) hours as needed for wheezing or shortness of breath. 8 g 6   apixaban (ELIQUIS) 2.5 MG TABS tablet Take 1 tablet (2.5 mg total) by mouth 2 (two) times daily. 60 tablet 6   atorvastatin (LIPITOR) 80 MG tablet Take 1 tablet (80 mg total) by mouth daily. 30 tablet 6   diazepam (VALIUM) 10 MG tablet Take 10 mg by mouth 3 (three) times daily as needed for anxiety.     empagliflozin (JARDIANCE) 10 MG TABS tablet Take 1 tablet (10 mg total) by mouth daily before breakfast. 14 tablet 11   hydrocortisone cream 1 % Apply topically as needed for itching. 28 g 0   Red Yeast Rice 600 MG CAPS Take 600 mg by mouth daily.     torsemide (DEMADEX) 20 MG tablet Take 1 tablet (20 mg total) by mouth every other day. 45 tablet 3   amiodarone (PACERONE) 200 MG tablet Take 0.5 tablets (100 mg total) by mouth daily. 45 tablet 3   umeclidinium-vilanterol (ANORO ELLIPTA) 62.5-25 MCG/ACT AEPB Inhale 1 puff into the lungs daily. (Patient not taking: Reported on 06/17/2022) 60 each 5   No current facility-administered medications for this encounter.    Vitals:   06/17/22 1406  BP: 120/70  Pulse: (!) 54  SpO2: 97%  Weight: 107.8 kg (237 lb 9.6 oz)   Wt Readings from Last 3 Encounters:  06/17/22 107.8 kg (237 lb 9.6 oz)  04/23/22 103.6 kg (228 lb 6.4 oz)  03/19/22 97.9 kg (215 lb 12.8 oz)    PHYSICAL EXAM: General: NAD Neck: No JVD, no thyromegaly or thyroid nodule.  Lungs: Clear to auscultation bilaterally with normal respiratory effort. CV: Nondisplaced PMI.  Heart regular S1/S2, no S3/S4, no murmur.  No peripheral edema.  No carotid bruit.  Normal pedal pulses.  Abdomen: Soft, nontender, no hepatosplenomegaly, no distention.  Skin: Intact without lesions or rashes.  Neurologic: Alert and oriented x 3.  Psych: Normal  affect. Extremities: No clubbing or cyanosis.  HEENT: Normal.   ASSESS 1. CAD: Patient presented with ACS in 5/23, peak HS-TnI 3557. LHC with 3VD => 50-60% dLM, ?90% ostial LAD, 75% stenosis at bifurcation of mLAD and D, 75% stenosis at bifurcation of mLCx and OM.  The ostial LAD is not completely laid out and on review, not sure that it is truly critical.  CABG would be a consideration if LAD disease is severe, may require FFR  to determine this. PCI distal left main into LAD also an option if FFR shows severe disease.  She has had no ischemic chest pain or dyspnea and EF had recovered on last echo in 8/23. Think we can hold off on further evaluation for now, especially as creatinine remains elevated (last creatinine was 2.05).  - Not on ASA given apixaban use.     - Continue high intensity statin. atorvastatin 80 daily.  Lipids ok in 8/23.  - Refer for cardiac rehab.  2. Cardiomyopathy: Possible stress (Takotsubo-type) cardiomyopathy. Patient has CAD as above, but not sure it explains the extent of her initial cardiomyopathy in 5/23 and her rapid improvement.  Low output on initial RHC in 5/23 with CI 1.8.  Echo in 5/23 initially with EF 20-25%, mildly decreased RV systolic function, trivial MR, dilated IVC. Repeat echo later in 5/23 with improvement in EF to 65-70% => suspect Takotsubo CMP.  Echo in 8/23 showed EF 65-70%, normal RV, mild MR, IVC normal.  NYHA class I-II symptoms.  She is not volume overloaded.  - Decrease torsemide to 20 mg every other day.  - BP stable, stop midodrine.  - Start Jardiance or Farxiga 10 mg daily (depending on insurance preference). BMET/BNP today and BMET in 10 days.  3.  Atrial fibrillation: Paroxysmal.  She is in NSR.  - Continue Eliquis.   - Continue amiodarone but decrease to 100 mg daily.  Check LFTs/TSH today, will need regular eye exam.  4.  CKD Stage 3: AKI in 5/23 with transient CVVH.  Last creatinine 2.05.  - check BMET today  - f/u with nephrology.  5.  COPD: Patient has quit smoking.  - She has followup with pulmonary.   Followup 4 months with APP   Loralie Champagne. 06/18/2022

## 2022-06-18 NOTE — Telephone Encounter (Signed)
Spoke to patient,aware of lab appointment already scheduled.

## 2022-06-24 ENCOUNTER — Telehealth (HOSPITAL_COMMUNITY): Payer: Self-pay

## 2022-06-24 ENCOUNTER — Encounter (HOSPITAL_COMMUNITY): Payer: Self-pay

## 2022-06-24 NOTE — Telephone Encounter (Signed)
Attempted to call patient in regards to Cardiac Rehab - LM on VM Mailed letter 

## 2022-06-27 ENCOUNTER — Ambulatory Visit (HOSPITAL_COMMUNITY)
Admission: RE | Admit: 2022-06-27 | Discharge: 2022-06-27 | Disposition: A | Discharge status: Home / Self Care | Payer: Medicare Other | Source: Ambulatory Visit | Attending: Cardiology | Admitting: Cardiology

## 2022-06-27 DIAGNOSIS — I5041 Acute combined systolic (congestive) and diastolic (congestive) heart failure: Secondary | ICD-10-CM | POA: Insufficient documentation

## 2022-06-27 LAB — BASIC METABOLIC PANEL
Anion gap: 10 (ref 5–15)
BUN: 48 mg/dL — ABNORMAL HIGH (ref 8–23)
CO2: 25 mmol/L (ref 22–32)
Calcium: 9.1 mg/dL (ref 8.9–10.3)
Chloride: 103 mmol/L (ref 98–111)
Creatinine, Ser: 2.12 mg/dL — ABNORMAL HIGH (ref 0.44–1.00)
GFR, Estimated: 23 mL/min — ABNORMAL LOW (ref >60)
Glucose, Bld: 87 mg/dL (ref 70–99)
Potassium: 4.2 mmol/L (ref 3.5–5.1)
Sodium: 138 mmol/L (ref 135–145)

## 2022-07-02 NOTE — Telephone Encounter (Signed)
Advanced Heart Failure Patient Advocate Encounter  Second attempt to reach patient for Jardiance assistance. Left message on patient voicemail.

## 2022-07-03 ENCOUNTER — Other Ambulatory Visit (HOSPITAL_COMMUNITY): Payer: Self-pay

## 2022-07-03 NOTE — Telephone Encounter (Signed)
Advanced Heart Failure Patient Advocate Encounter  The patient was approved for a [employer] grant that will help cover the cost of Jardiance.  Total amount awarded, $10,000.  Effective: 06/03/2022 - 06/03/2023.  BIN [id number] PCN [id number] Group [id number] ID [id number]  This will cover costs after the start of the new year. Samples provided to patient for this month.  Medication Samples have been left at registration desk for patient pick up. Drug name: Jardiance '10MG'$  Qty: 4x (7 ct) packages LOT: [id number] Exp.: 06/2024 SIG: Take 1 tablet by mouth once daily   The patient has been instructed regarding the correct time, dose, and frequency of taking this medication, including desired effects and most common side effects.   Left message on patient voicemail.  Clista Bernhardt, CPhT Rx Patient Advocate Phone: [phone]

## 2022-07-08 ENCOUNTER — Telehealth (HOSPITAL_COMMUNITY): Payer: Self-pay

## 2022-07-08 NOTE — Telephone Encounter (Signed)
Pt returned CR phone call and stated she is interested in CR. Placed pt ppw in CR ready to schedule.

## 2022-07-16 ENCOUNTER — Telehealth (HOSPITAL_COMMUNITY): Payer: Self-pay

## 2022-07-16 NOTE — Telephone Encounter (Signed)
Called patient to see if she was interested in participating in the Cardiac Rehab Program. Patient stated yes. Patient will come in for orientation on 07/25/22 @ 1:15PM and will attend the 1:45PM exercise class.   Tourist information centre manager.

## 2022-07-22 NOTE — Progress Notes (Signed)
RN called pt to confirm Cardiac Rehab appointment on 12/7 at 1:15pm. Pt confirmed her appointment. RN completed patient's "Cardiac Rehabilitation Cardiac Risk Profile Nursing Assessment", answered all questions and provided information for her upcoming appointment. Pt understand without assistance.

## 2022-07-25 ENCOUNTER — Telehealth (HOSPITAL_COMMUNITY): Payer: Self-pay

## 2022-07-25 ENCOUNTER — Encounter (HOSPITAL_COMMUNITY)
Admission: RE | Admit: 2022-07-25 | Discharge: 2022-07-25 | Disposition: A | Discharge status: Home / Self Care | Payer: Medicare Other | Source: Ambulatory Visit | Attending: Cardiology | Admitting: Cardiology

## 2022-07-25 NOTE — Telephone Encounter (Signed)
Pt insurance is active and benefits verified through Medicare A/B. Co-pay $0.00, DED $226.00/$226.00 met, out of pocket $0.00/$0.00 met, co-insurance 20%. No pre-authorization required. Passport, 07/25/22 @ 1:21PM, OPF#[id number]  2ndary insurance is active and benefits verified through [employer]. Co-pay $0.00, DED $0.00/$0.00 met, out of pocket $0.00/$0.00 met, co-insurance 0%. No pre-authorization required.  How many CR sessions are covered? (36 sessions for TCR, 72 sessions for ICR)72 Is this a lifetime maximum or an annual maximum? Lifetime Has the member used any of these services to date? No Is there a time limit (weeks/months) on start of program and/or program completion? No

## 2022-07-25 NOTE — Progress Notes (Signed)
Incomplete Session Note  Patient Details  Name: SELIA Jackson MRN: [id number] Date of Birth: 03-03-1941 Referring Provider:    [name] did not complete her rehab session.  Brittany Jackson reported that she fell 3 weeks ago during the night hurting her left side and both knees. Brittany Jackson says that her knee and left side are still bothering her. I advised Brittany Jackson to follow up with her primary care provider Dr Gerarda Fraction before proceeding with exercise.  Brittany Jackson has an appointment on December 13th at 2:00 pm. I called Dr Nolon Rod office who is out of the office next week but can see her on her previously scheduled appointment. Will cancel exercise appointments for now and await clearance for [name] to proceed with exercise. Will cancel current appointments  and hold off on proceeding with orientation today. Patient states understanding.Barnet Pall, RN,BSN 07/25/2022 2:32 PM

## 2022-07-29 ENCOUNTER — Ambulatory Visit (HOSPITAL_COMMUNITY): Payer: Medicare Other

## 2022-07-30 ENCOUNTER — Telehealth (HOSPITAL_COMMUNITY): Payer: Self-pay | Admitting: Cardiology

## 2022-07-30 NOTE — Telephone Encounter (Signed)
Pt called to report increase in dizzy spells over the past week or so. Reports she fell at some point in the middle of the night and unsure how she got out of the bed  Also reports she is unable to start cardiac rehab until she is seen by PCP per her insurance  Pt would like follow up prior to current appt 2/24 Next avail 1/8 @ 3   Will forward to provider for further recommendations if needed prior to upcoming appt.

## 2022-07-31 ENCOUNTER — Ambulatory Visit (HOSPITAL_COMMUNITY): Payer: Medicare Other

## 2022-07-31 DIAGNOSIS — Z0001 Encounter for general adult medical examination with abnormal findings: Secondary | ICD-10-CM | POA: Diagnosis not present

## 2022-07-31 DIAGNOSIS — E782 Mixed hyperlipidemia: Secondary | ICD-10-CM | POA: Diagnosis not present

## 2022-07-31 DIAGNOSIS — Z1331 Encounter for screening for depression: Secondary | ICD-10-CM | POA: Diagnosis not present

## 2022-07-31 DIAGNOSIS — Z23 Encounter for immunization: Secondary | ICD-10-CM | POA: Diagnosis not present

## 2022-07-31 DIAGNOSIS — Z6839 Body mass index (BMI) 39.0-39.9, adult: Secondary | ICD-10-CM | POA: Diagnosis not present

## 2022-07-31 DIAGNOSIS — I10 Essential (primary) hypertension: Secondary | ICD-10-CM | POA: Diagnosis not present

## 2022-07-31 DIAGNOSIS — I5031 Acute diastolic (congestive) heart failure: Secondary | ICD-10-CM | POA: Diagnosis not present

## 2022-07-31 DIAGNOSIS — I252 Old myocardial infarction: Secondary | ICD-10-CM | POA: Diagnosis not present

## 2022-07-31 DIAGNOSIS — N182 Chronic kidney disease, stage 2 (mild): Secondary | ICD-10-CM | POA: Diagnosis not present

## 2022-07-31 DIAGNOSIS — I872 Venous insufficiency (chronic) (peripheral): Secondary | ICD-10-CM | POA: Diagnosis not present

## 2022-07-31 DIAGNOSIS — M1A00X0 Idiopathic chronic gout, unspecified site, without tophus (tophi): Secondary | ICD-10-CM | POA: Diagnosis not present

## 2022-07-31 DIAGNOSIS — E6609 Other obesity due to excess calories: Secondary | ICD-10-CM | POA: Diagnosis not present

## 2022-08-02 ENCOUNTER — Ambulatory Visit (HOSPITAL_COMMUNITY): Payer: Medicare Other

## 2022-08-05 ENCOUNTER — Ambulatory Visit (HOSPITAL_COMMUNITY): Payer: Medicare Other

## 2022-08-05 DIAGNOSIS — H903 Sensorineural hearing loss, bilateral: Secondary | ICD-10-CM | POA: Diagnosis not present

## 2022-08-07 ENCOUNTER — Ambulatory Visit (HOSPITAL_COMMUNITY): Payer: Medicare Other

## 2022-08-09 ENCOUNTER — Ambulatory Visit (HOSPITAL_COMMUNITY): Payer: Medicare Other

## 2022-08-14 ENCOUNTER — Ambulatory Visit (HOSPITAL_COMMUNITY): Payer: Medicare Other

## 2022-08-14 DIAGNOSIS — D649 Anemia, unspecified: Secondary | ICD-10-CM | POA: Diagnosis not present

## 2022-08-14 DIAGNOSIS — I509 Heart failure, unspecified: Secondary | ICD-10-CM | POA: Diagnosis not present

## 2022-08-14 DIAGNOSIS — I129 Hypertensive chronic kidney disease with stage 1 through stage 4 chronic kidney disease, or unspecified chronic kidney disease: Secondary | ICD-10-CM | POA: Diagnosis not present

## 2022-08-14 DIAGNOSIS — N2581 Secondary hyperparathyroidism of renal origin: Secondary | ICD-10-CM | POA: Diagnosis not present

## 2022-08-14 DIAGNOSIS — N184 Chronic kidney disease, stage 4 (severe): Secondary | ICD-10-CM | POA: Diagnosis not present

## 2022-08-16 ENCOUNTER — Ambulatory Visit (HOSPITAL_COMMUNITY): Payer: Medicare Other

## 2022-08-20 DIAGNOSIS — Z1231 Encounter for screening mammogram for malignant neoplasm of breast: Secondary | ICD-10-CM | POA: Diagnosis not present

## 2022-08-20 DIAGNOSIS — M8589 Other specified disorders of bone density and structure, multiple sites: Secondary | ICD-10-CM | POA: Diagnosis not present

## 2022-08-21 ENCOUNTER — Ambulatory Visit (HOSPITAL_COMMUNITY): Payer: Medicare Other

## 2022-08-22 ENCOUNTER — Other Ambulatory Visit (HOSPITAL_COMMUNITY): Payer: Self-pay | Admitting: Adult Health

## 2022-08-23 ENCOUNTER — Ambulatory Visit (HOSPITAL_COMMUNITY): Payer: Medicare Other

## 2022-08-23 DIAGNOSIS — M545 Low back pain, unspecified: Secondary | ICD-10-CM | POA: Diagnosis not present

## 2022-08-23 DIAGNOSIS — G894 Chronic pain syndrome: Secondary | ICD-10-CM | POA: Diagnosis not present

## 2022-08-23 DIAGNOSIS — N390 Urinary tract infection, site not specified: Secondary | ICD-10-CM | POA: Diagnosis not present

## 2022-08-23 NOTE — Progress Notes (Signed)
PCP: Redmond School, MD HF Cardiologist: Dr Aundra Dubin Nephrology:  [facility]    HPI: Brittany Jackson is a 82 y.o. female with history of chronic diastolic CHF, tobacco use, HTN, former smoker, and COPD.   Admitted 01/08/22 with inferolateral STEMI c/b acute systolic CHF with low-output. No intervention initially. She was seen by CT surgery, CABG not recommended.  Hospital course c/b AKI on CKD Stage IIIa, HAP, and leukocytosis. Briefly on CVVHD and had gradual improvement. Creatinine at d/c down to 2.0.  Developed rash felt to be in the setting of amiodarone. Treated with steroids and switched to amio without dye with improvement. Discharged on 01/22/22. She had paroxysmal atrial fibrillation during this admission.   Echo in 8/23 showed EF 65-70%, normal RV, mild MR, IVC normal. CT chest in 9/23 showed emphysema.   Follow up 10/23, NYHA I-II, BP stable so midodrine stopped. SGLT2i started and torsemide decreased to 20 mg every other day.  Called clinic 07/30/22 with complaints of dizziness.  Today she returns for an acute visit with complaints of dizziness. She had one episode a month ago where she got up out of bed in the middle of the night and had to lower herself to the floor. No further events. Overall feeling fine. She is not short of breath walking on flat ground. Denies palpitations, abnormal bleeding, CP, edema, or PND/Orthopnea. Appetite ok. No fever or chills. Weight at home 237 pounds. Taking all medications. Remains quit from cigarettes. Planning on starting Cardiac Rehab soon, thought her visit today was for CR.  ECG (personally reviewed): NSR with 1AVB, 65 bpm  Labs (7/23): K 4.6, creatinine 2.4, BNP 48 Labs (8/23): LDL 71, HDL 56, hgb 10.8, TSH normal, K 4.5, creatinine 2.05 Labs (10/23): LFTs normal, TSH normal Labs (11/23): K 4.2, creatinine 2.12  PMH: 1. Inflammatory dermatosis 2. HTN 3. CKD stage 3 4. COPD: Smoker until 5/23.  Obstructive PFTs in 5/23.  - CT chest (9/23)  with emphysema.  5. Atrial fibrillation: Paroxysmal.  6. Cardiomyopathy: Initially thought to be ischemic but suspect stress (Takotsubo-type) CMP given rapid improvement.  - Echo (5/23): EF 20-25%, mildly decreased RV systolic function, trivial MR, dilated IVC.  - Echo (5/23, repeat): improvement in EF to 65-70%  - Echo (8/23): EF 65-70%, normal RV, mild MR, IVC normal. 7. CAD: inferolateral STEMI by ECG 5/23 with HS-TnI in 3000 range, LHC with 3VD => 50-60% dLM, ?90% ostial LAD, 75% stenosis at bifurcation of mLAD and D, 75% stenosis at bifurcation of mLCx and OM.  The ostial LAD is not completely laid out and on review, I am not sure that it is truly critical.   ROS: All systems negative except as listed in HPI, PMH and Problem List.  SH:  Social History   Socioeconomic History   Marital status: Widowed    Spouse name: Not on file   Number of children: Not on file   Years of education: Not on file   Highest education level: Not on file  Occupational History   Not on file  Tobacco Use   Smoking status: Former    Packs/day: 1.00    Years: 60.00    Total pack years: 60.00    Types: Cigarettes    Start date: 72    Quit date: 01/08/2022    Years since quitting: 0.6   Smokeless tobacco: Never  Vaping Use   Vaping Use: Never used  Substance and Sexual Activity   Alcohol use: Yes    Comment:  occasional   Drug use: No   Sexual activity: Never    Birth control/protection: Surgical  Other Topics Concern   Not on file  Social History Narrative   Not on file   Social Determinants of Health   Financial Resource Strain: Not on file  Food Insecurity: No Food Insecurity (01/22/2022)   Hunger Vital Sign    Worried About Running Out of Food in the Last Year: Never true    Ran Out of Food in the Last Year: Never true  Transportation Needs: No Transportation Needs (01/22/2022)   PRAPARE - Hydrologist (Medical): No    Lack of Transportation (Non-Medical):  No  Physical Activity: Not on file  Stress: Not on file  Social Connections: Not on file  Intimate Partner Violence: Not on file   FH:  Family History  Problem Relation Age of Onset   Hypertension Sister    Allergic rhinitis Neg Hx    Asthma Neg Hx    Angioedema Neg Hx    Atopy Neg Hx    Eczema Neg Hx    Immunodeficiency Neg Hx    Urticaria Neg Hx    Past Medical History:  Diagnosis Date   CHF (congestive heart failure) (HCC)    Hypertension    Urticaria    Current Outpatient Medications  Medication Sig Dispense Refill   acetaminophen (TYLENOL) 500 MG tablet Take 500-1,000 mg by mouth every 6 (six) hours as needed (pain.).     albuterol (VENTOLIN HFA) 108 (90 Base) MCG/ACT inhaler Inhale 2 puffs into the lungs every 6 (six) hours as needed for wheezing or shortness of breath. 8 g 6   amiodarone (PACERONE) 200 MG tablet Take 0.5 tablets (100 mg total) by mouth daily. 45 tablet 3   apixaban (ELIQUIS) 2.5 MG TABS tablet Take 1 tablet (2.5 mg total) by mouth 2 (two) times daily. 60 tablet 6   atorvastatin (LIPITOR) 80 MG tablet TAKE ONE TABLET BY MOUTH DAILY 30 tablet 5   Coenzyme Q10 (COQ10 PO) Take 1 capsule by mouth in the morning.     colchicine 0.6 MG tablet Take 0.6 mg by mouth 2 (two) times daily as needed (onset of gout).     diazepam (VALIUM) 10 MG tablet Take 10 mg by mouth 3 (three) times daily as needed for anxiety.     empagliflozin (JARDIANCE) 10 MG TABS tablet Take 1 tablet (10 mg total) by mouth daily before breakfast. 14 tablet 11   hydrocortisone cream 1 % Apply topically as needed for itching. 28 g 0   Multiple Vitamins-Minerals (ADULT ONE DAILY GUMMIES PO) Take 1 tablet by mouth in the morning.     Red Yeast Rice 600 MG CAPS Take 600 mg by mouth daily.     torsemide (DEMADEX) 20 MG tablet Take 1 tablet (20 mg total) by mouth every other day. 45 tablet 3   umeclidinium-vilanterol (ANORO ELLIPTA) 62.5-25 MCG/ACT AEPB Inhale 1 puff into the lungs daily. (Patient  not taking: Reported on 06/17/2022) 60 each 5   No current facility-administered medications for this encounter.   BP 136/72   Pulse 74   Wt 108.9 kg (240 lb)   SpO2 96%   BMI 38.74 kg/m   Wt Readings from Last 3 Encounters:  08/26/22 108.9 kg (240 lb)  06/17/22 107.8 kg (237 lb 9.6 oz)  04/23/22 103.6 kg (228 lb 6.4 oz)   PHYSICAL EXAM: General:  NAD. No resp difficulty, walked into clinic  HEENT: Normal Neck: Supple. No JVD. Carotids 2+ bilat; no bruits. No lymphadenopathy or thryomegaly appreciated. Cor: PMI nondisplaced. Regular rate & rhythm. No rubs, gallops or murmurs. Lungs: Clear Abdomen: Soft, nontender, nondistended. No hepatosplenomegaly. No bruits or masses. Good bowel sounds. Extremities: No cyanosis, clubbing, rash, edema Neuro: Alert & oriented x 3, cranial nerves grossly intact. Moves all 4 extremities w/o difficulty. Affect pleasant.   ASSESSMENT/PLAN: 1. CAD: Patient presented with ACS in 5/23, peak HS-TnI 3557. LHC with 3VD => 50-60% dLM, ?90% ostial LAD, 75% stenosis at bifurcation of mLAD and D, 75% stenosis at bifurcation of mLCx and OM.  The ostial LAD is not completely laid out and on review, not sure that it is truly critical.  CABG would be a consideration if LAD disease is severe, may require FFR to determine this. PCI distal left main into LAD also an option if FFR shows severe disease.  She has had no ischemic chest pain or dyspnea and EF had recovered on last echo in 8/23. Think we can hold off on further evaluation for now, especially as creatinine remains elevated (last creatinine was 2.05).  - Not on ASA given apixaban use.      - Continue high intensity statin, Lipids ok in 8/23.  - Encouraged her to participate in Cardiac Rehab. 2. Cardiomyopathy: Possible stress (Takotsubo-type) cardiomyopathy. Patient has CAD as above, but not sure it explains the extent of her initial cardiomyopathy in 5/23 and her rapid improvement.  Low output on initial RHC in  5/23 with CI 1.8.  Echo in 5/23 initially with EF 20-25%, mildly decreased RV systolic function, trivial MR, dilated IVC. Repeat echo later in 5/23 with improvement in EF to 65-70% => suspect Takotsubo CMP.  Echo in 8/23 showed EF 65-70%, normal RV, mild MR, IVC normal.  NYHA class II symptoms.  She is not volume overloaded. ReDs 34%. Suspect recent dizziness was transient and positional. - Continue torsemide 20 mg every other day. BMET and BNP today. - Continue Jardiance 10 mg daily. No GU symptoms. - Off midodrine with stable BP. 3.  Atrial fibrillation: Paroxysmal.  She is in NSR.  - Continue Eliquis.  No bleeding issues. - Continue amiodarone 100 mg daily. LFTs/TSH ok 10/23, will need regular eye exam.  4.  CKD Stage 3: AKI in 5/23 with transient CVVH.  Last creatinine 2.12.  - She followed with Nephrology. - BMET today. 5. COPD: Patient has quit smoking.  - She has followup with pulmonary.   Follow up 4 months with Dr. Aundra Dubin; OK to cancel APP follow up later this month.  Brittany Jackson [facility] FNP-BC 08/26/2022

## 2022-08-26 ENCOUNTER — Ambulatory Visit (HOSPITAL_COMMUNITY)
Admission: RE | Admit: 2022-08-26 | Discharge: 2022-08-26 | Disposition: A | Discharge status: Home / Self Care | Payer: Medicare Other | Source: Ambulatory Visit | Attending: Family Medicine | Admitting: Family Medicine

## 2022-08-26 ENCOUNTER — Other Ambulatory Visit (HOSPITAL_COMMUNITY): Payer: Self-pay

## 2022-08-26 ENCOUNTER — Ambulatory Visit (HOSPITAL_COMMUNITY): Payer: Medicare Other

## 2022-08-26 ENCOUNTER — Telehealth (HOSPITAL_COMMUNITY): Payer: Self-pay

## 2022-08-26 ENCOUNTER — Encounter (HOSPITAL_COMMUNITY): Payer: Self-pay

## 2022-08-26 VITALS — BP 136/72 | HR 74 | Wt 240.0 lb

## 2022-08-26 DIAGNOSIS — Z7984 Long term (current) use of oral hypoglycemic drugs: Secondary | ICD-10-CM | POA: Diagnosis not present

## 2022-08-26 DIAGNOSIS — Z955 Presence of coronary angioplasty implant and graft: Secondary | ICD-10-CM | POA: Insufficient documentation

## 2022-08-26 DIAGNOSIS — J449 Chronic obstructive pulmonary disease, unspecified: Secondary | ICD-10-CM

## 2022-08-26 DIAGNOSIS — I5042 Chronic combined systolic (congestive) and diastolic (congestive) heart failure: Secondary | ICD-10-CM | POA: Insufficient documentation

## 2022-08-26 DIAGNOSIS — M170 Bilateral primary osteoarthritis of knee: Secondary | ICD-10-CM | POA: Diagnosis not present

## 2022-08-26 DIAGNOSIS — Z79899 Other long term (current) drug therapy: Secondary | ICD-10-CM | POA: Insufficient documentation

## 2022-08-26 DIAGNOSIS — N1831 Chronic kidney disease, stage 3a: Secondary | ICD-10-CM | POA: Insufficient documentation

## 2022-08-26 DIAGNOSIS — I130 Hypertensive heart and chronic kidney disease with heart failure and stage 1 through stage 4 chronic kidney disease, or unspecified chronic kidney disease: Secondary | ICD-10-CM | POA: Insufficient documentation

## 2022-08-26 DIAGNOSIS — M1712 Unilateral primary osteoarthritis, left knee: Secondary | ICD-10-CM | POA: Diagnosis not present

## 2022-08-26 DIAGNOSIS — M1711 Unilateral primary osteoarthritis, right knee: Secondary | ICD-10-CM | POA: Diagnosis not present

## 2022-08-26 DIAGNOSIS — N179 Acute kidney failure, unspecified: Secondary | ICD-10-CM | POA: Diagnosis not present

## 2022-08-26 DIAGNOSIS — Z87891 Personal history of nicotine dependence: Secondary | ICD-10-CM | POA: Insufficient documentation

## 2022-08-26 DIAGNOSIS — Z7901 Long term (current) use of anticoagulants: Secondary | ICD-10-CM | POA: Insufficient documentation

## 2022-08-26 DIAGNOSIS — I5032 Chronic diastolic (congestive) heart failure: Secondary | ICD-10-CM | POA: Diagnosis not present

## 2022-08-26 DIAGNOSIS — I2510 Atherosclerotic heart disease of native coronary artery without angina pectoris: Secondary | ICD-10-CM | POA: Diagnosis not present

## 2022-08-26 DIAGNOSIS — I252 Old myocardial infarction: Secondary | ICD-10-CM | POA: Diagnosis not present

## 2022-08-26 DIAGNOSIS — N183 Chronic kidney disease, stage 3 unspecified: Secondary | ICD-10-CM | POA: Diagnosis not present

## 2022-08-26 DIAGNOSIS — I480 Paroxysmal atrial fibrillation: Secondary | ICD-10-CM | POA: Diagnosis not present

## 2022-08-26 LAB — BASIC METABOLIC PANEL
Anion gap: 10 (ref 5–15)
BUN: 31 mg/dL — ABNORMAL HIGH (ref 8–23)
CO2: 23 mmol/L (ref 22–32)
Calcium: 8.8 mg/dL — ABNORMAL LOW (ref 8.9–10.3)
Chloride: 102 mmol/L (ref 98–111)
Creatinine, Ser: 1.99 mg/dL — ABNORMAL HIGH (ref 0.44–1.00)
GFR, Estimated: 24 mL/min — ABNORMAL LOW (ref >60)
Glucose, Bld: 153 mg/dL — ABNORMAL HIGH (ref 70–99)
Potassium: 4.3 mmol/L (ref 3.5–5.1)
Sodium: 135 mmol/L (ref 135–145)

## 2022-08-26 LAB — BRAIN NATRIURETIC PEPTIDE: B Natriuretic Peptide: 70.1 pg/mL (ref 0.0–100.0)

## 2022-08-26 NOTE — Patient Instructions (Signed)
Thank you for coming in today  Labs were done today, if any labs are abnormal the clinic will call you No news is good news  Your physician recommends that you schedule a follow-up appointment in:  4 months Dr. Aundra Dubin  EKG today   You have been giving Jardiance samples     Do the following things EVERYDAY: Weigh yourself in the morning before breakfast. Write it down and keep it in a log. Take your medicines as prescribed Eat low salt foods--Limit salt (sodium) to 2000 mg per day.  Stay as active as you can everyday Limit all fluids for the day to less than 2 liters  At the [facility], you and your health needs are our priority. As part of our continuing mission to provide you with exceptional heart care, we have created designated Provider Care Teams. These Care Teams include your primary Cardiologist (physician) and Advanced Practice Providers (APPs- Physician Assistants and Nurse Practitioners) who all work together to provide you with the care you need, when you need it.   You may see any of the following providers on your designated Care Team at your next follow up: Dr Glori Bickers Dr Loralie Champagne Dr. Roxana Hires, NP Lyda Jester, [place] [facility] [place], [place] Forestine Na, NP Audry Riles, PharmD   Please be sure to bring in all your medications bottles to every appointment.   If you have any questions or concerns before your next appointment please send [place] a message through [place] or call our office at [phone].    TO LEAVE A MESSAGE FOR THE NURSE SELECT OPTION 2, PLEASE LEAVE A MESSAGE INCLUDING: YOUR NAME DATE OF BIRTH CALL BACK NUMBER REASON FOR CALL**this is important as we prioritize the call backs  YOU WILL RECEIVE A CALL BACK THE SAME DAY AS LONG AS YOU CALL BEFORE 4:00 PM

## 2022-08-26 NOTE — Telephone Encounter (Signed)
Advanced Heart Failure Patient Advocate Encounter  Returned call from patient, she is almost out of Jardiance and Eliquis. I contacted [facility] and confirmed rx is available and grant is current. GC is filling Jardiance for $0 copay. Eliquis test claim returns $130+ copay and will not be eligible for assistance until 3% of income is spent in 2024.   Medication Samples have been left at registration desk for patient pick up. Drug name: Eliquis 2.'5MG'$  Qty: 4x 14 ct package LOT: [id number] Exp.: 09/2022 SIG: Take 1 tablet by mouth twice daily   The patient has been instructed regarding the correct time, dose, and frequency of taking this medication, including desired effects and most common side effects.   Clista Bernhardt, CPhT Rx Patient Advocate Phone: [phone]

## 2022-08-28 ENCOUNTER — Ambulatory Visit (HOSPITAL_COMMUNITY): Payer: Medicare Other

## 2022-08-28 ENCOUNTER — Telehealth (HOSPITAL_COMMUNITY): Payer: Self-pay | Admitting: *Deleted

## 2022-08-28 NOTE — Telephone Encounter (Signed)
Ok to sign encounter Pt seen in [place]

## 2022-08-28 NOTE — Telephone Encounter (Signed)
Spoke with Dr Nolon Rod office. Dr Gerarda Fraction said that the patient is okay to proceed with exercise. Will notify the patient.Harrell Gave RN BSN

## 2022-08-28 NOTE — Telephone Encounter (Signed)
Spoke with the patient al let her know that she has been cleared to start exercise per her primary care physician, Dr Noralee Chars Miguel Rota RN BSN

## 2022-08-29 ENCOUNTER — Other Ambulatory Visit (HOSPITAL_COMMUNITY): Payer: Self-pay

## 2022-08-29 ENCOUNTER — Telehealth: Payer: Self-pay | Admitting: Pulmonary Disease

## 2022-08-29 NOTE — Telephone Encounter (Signed)
Alternatives are still very high cost due to patients deductible for the beginning of the year.

## 2022-08-29 NOTE — Telephone Encounter (Signed)
PT calling. Upset Script is 400.00 for her Anora Spray. Pls call PT to advise if we have a substitute.  Thanks,

## 2022-08-29 NOTE — Telephone Encounter (Signed)
Routing to pharmacy team to see if there is a cheaper alternative for pt than Anoro.

## 2022-08-29 NOTE — Telephone Encounter (Signed)
Called and spoke with pt letting her know the info per pharmacy team. Stated to pt that she could try to see if she could be approved for pt assistance and provided her with [employer]'s phone number for her to call to do enrollment over the phone. Nothing further needed.

## 2022-08-30 ENCOUNTER — Ambulatory Visit (HOSPITAL_COMMUNITY): Payer: Medicare Other

## 2022-09-02 ENCOUNTER — Ambulatory Visit (HOSPITAL_COMMUNITY): Payer: Medicare Other

## 2022-09-04 ENCOUNTER — Telehealth (HOSPITAL_COMMUNITY): Payer: Self-pay

## 2022-09-04 ENCOUNTER — Encounter (HOSPITAL_COMMUNITY): Payer: Self-pay

## 2022-09-04 ENCOUNTER — Ambulatory Visit (HOSPITAL_COMMUNITY): Payer: Medicare Other

## 2022-09-04 NOTE — Telephone Encounter (Signed)
Attempted to call patient in regards to rescheduling Cardiac Rehab - LM on VM Mailed letter.

## 2022-09-06 ENCOUNTER — Ambulatory Visit (HOSPITAL_COMMUNITY): Payer: Medicare Other

## 2022-09-09 ENCOUNTER — Ambulatory Visit (HOSPITAL_COMMUNITY): Payer: Medicare Other

## 2022-09-11 ENCOUNTER — Ambulatory Visit (HOSPITAL_COMMUNITY): Payer: Medicare Other

## 2022-09-13 ENCOUNTER — Ambulatory Visit (HOSPITAL_COMMUNITY): Payer: Medicare Other

## 2022-09-16 ENCOUNTER — Ambulatory Visit (HOSPITAL_COMMUNITY): Payer: Medicare Other

## 2022-09-18 ENCOUNTER — Ambulatory Visit (HOSPITAL_COMMUNITY): Payer: Medicare Other

## 2022-09-20 ENCOUNTER — Telehealth: Payer: Self-pay | Admitting: Pulmonary Disease

## 2022-09-20 ENCOUNTER — Ambulatory Visit (HOSPITAL_COMMUNITY): Payer: Medicare Other

## 2022-09-20 NOTE — Telephone Encounter (Signed)
Called and spoke to patient and she states that the Anoro is costing her over $400. She states that she can not afford this. Can we run a ticket to see what is covered with her insurance  Please advise

## 2022-09-24 ENCOUNTER — Telehealth (HOSPITAL_COMMUNITY): Payer: Self-pay

## 2022-09-24 IMAGING — CT CT CHEST W/O CM
2 of 4 series · 15 of 36 positions shown, 18 images · non-contrast
Comparison: Chest radiograph 01/14/2022

CLINICAL DATA: Pneumonia



[Series 4: thorax 2.0 · axial · 0.72mm/px · z∈[+1029,+1301]mm · 12 of 154 slices shown, 15 images]
[im 9/154  mediastinal]
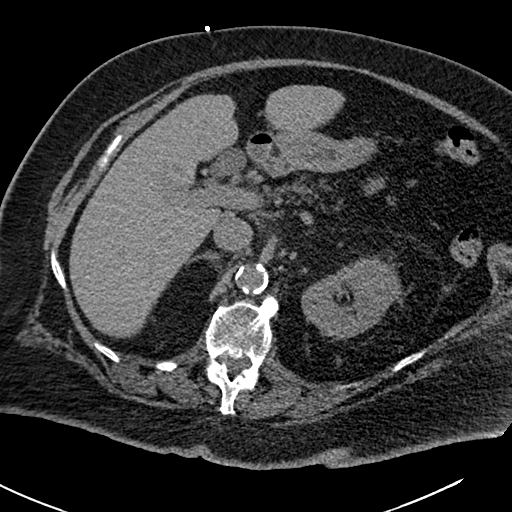
[im 9/154  lung]
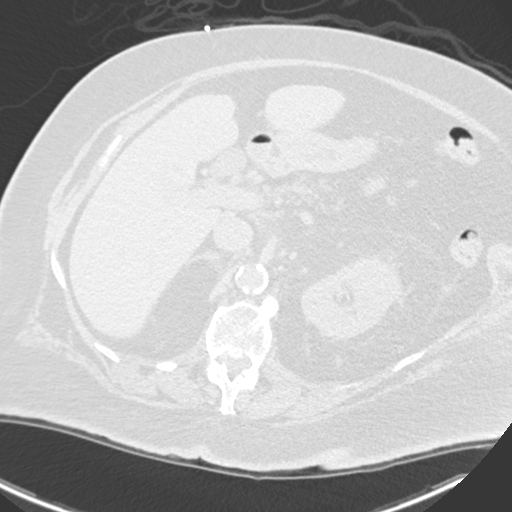
[im 25/154  lung]
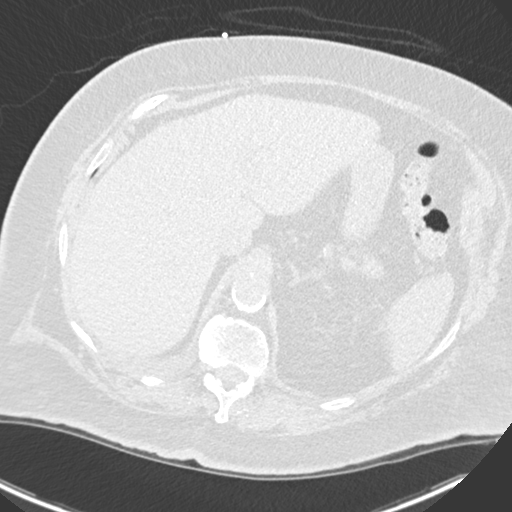
[im 33/154  lung]
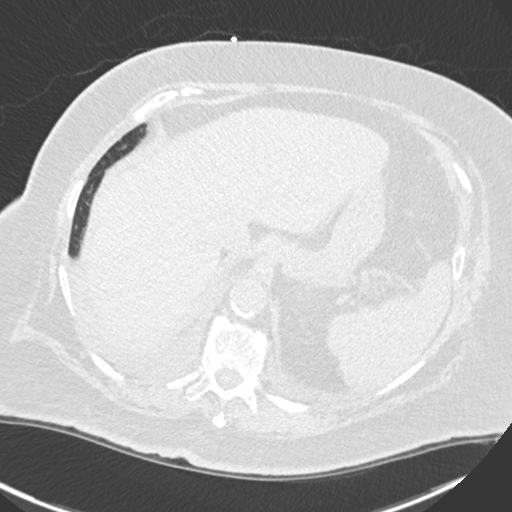
[im 49/154  lung]
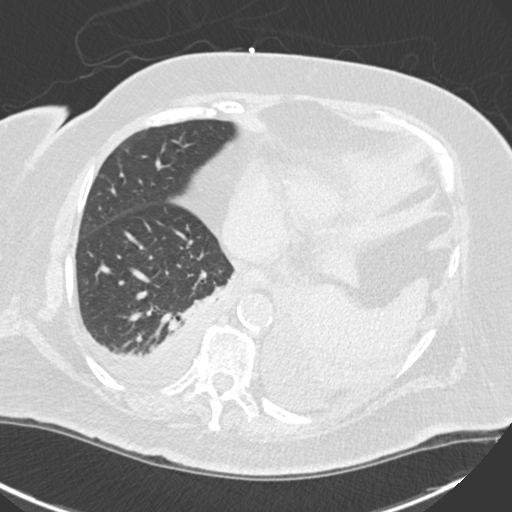
[im 57/154  mediastinal]
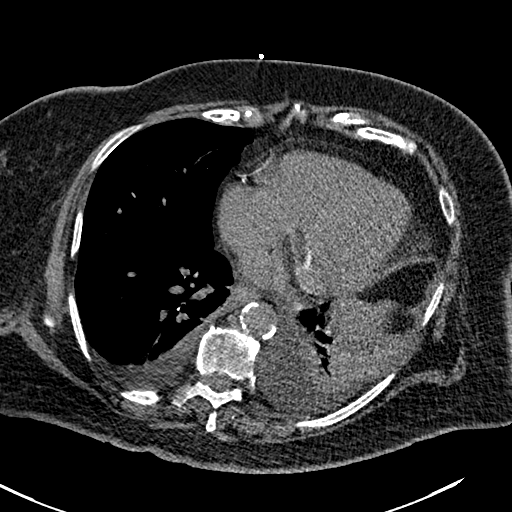
[im 57/154  lung]
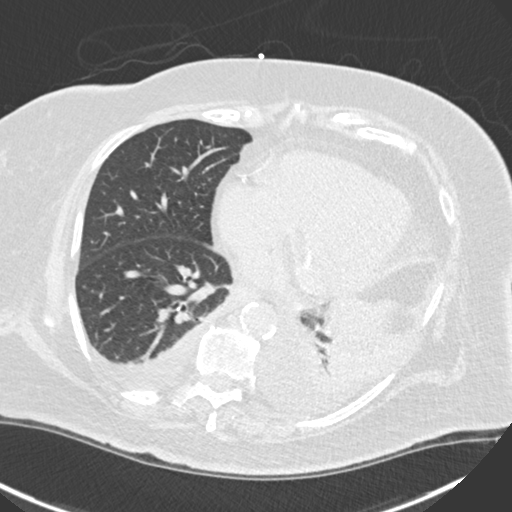
[im 73/154  lung]
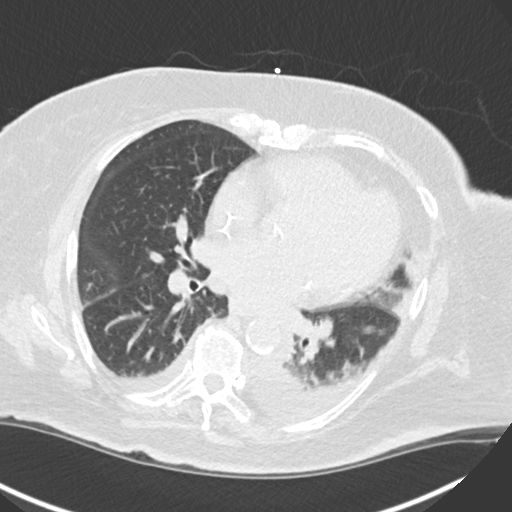
[im 81/154  lung]
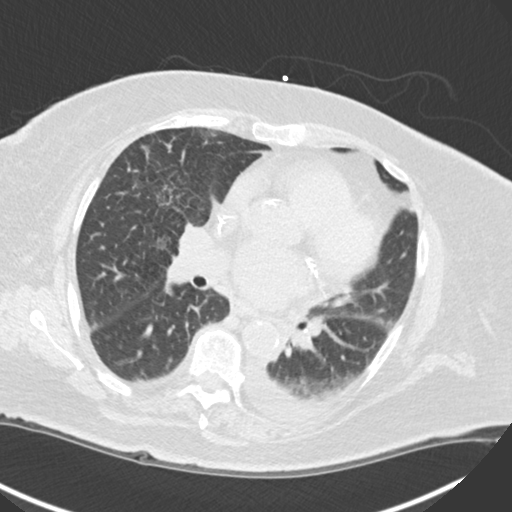
[im 97/154  lung]
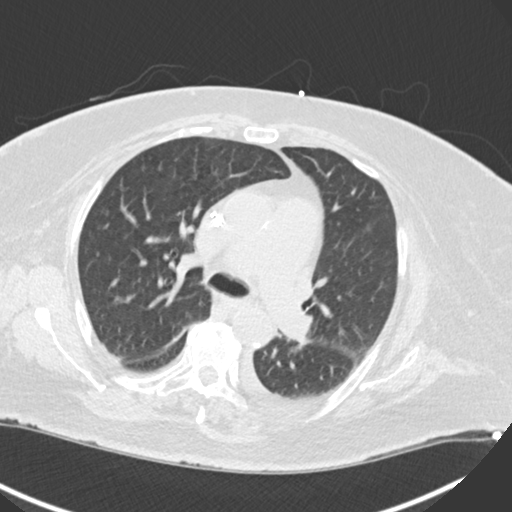
[im 105/154  mediastinal]
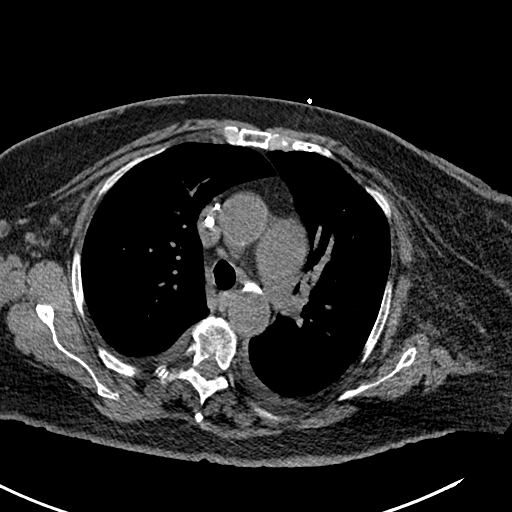
[im 105/154  lung]
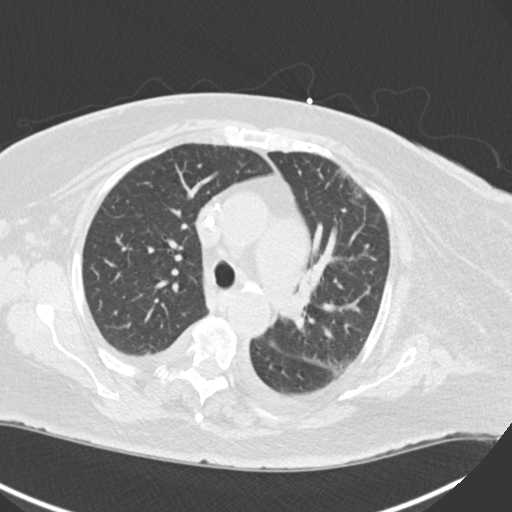
[im 121/154  lung]
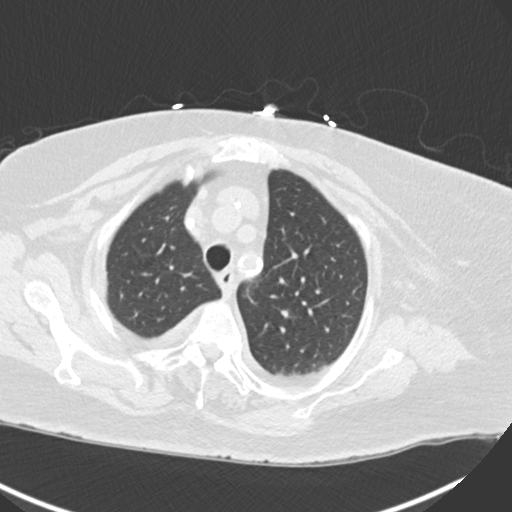
[im 129/154  lung]
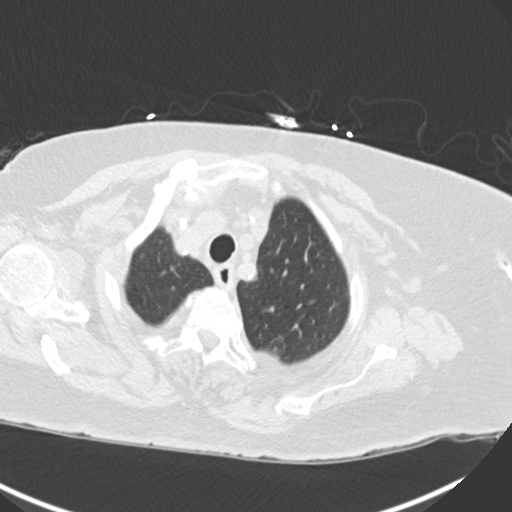
[im 145/154  lung]
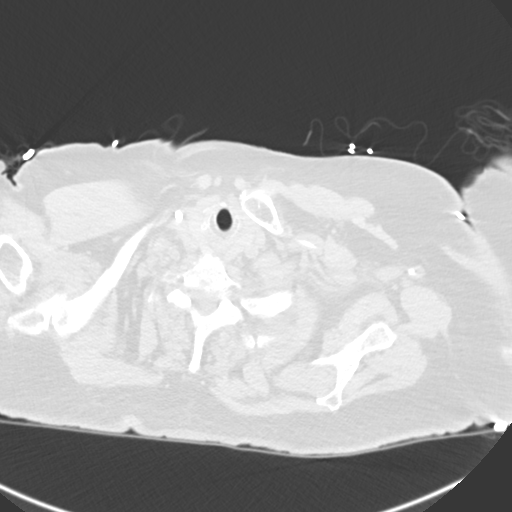

[Series 6: coronal · coronal · 0.60mm/px · 3 of 105 slices shown]
[im 21/105  lung]
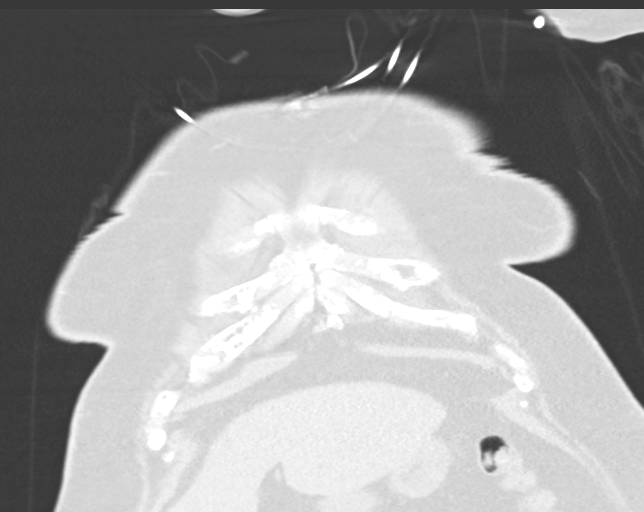
[im 42/105  lung]
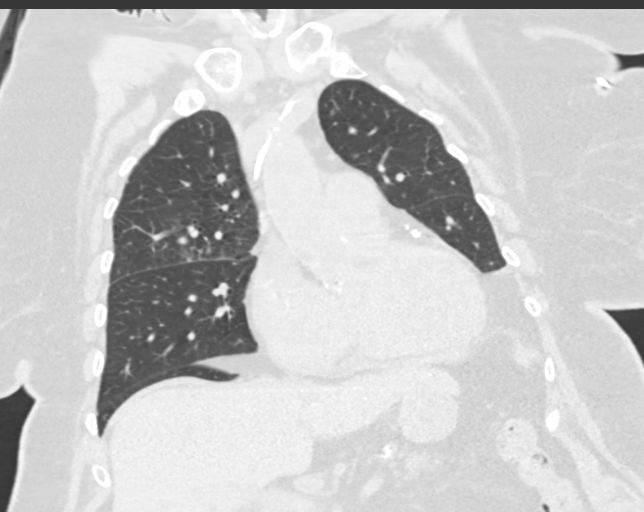
[im 63/105  lung]
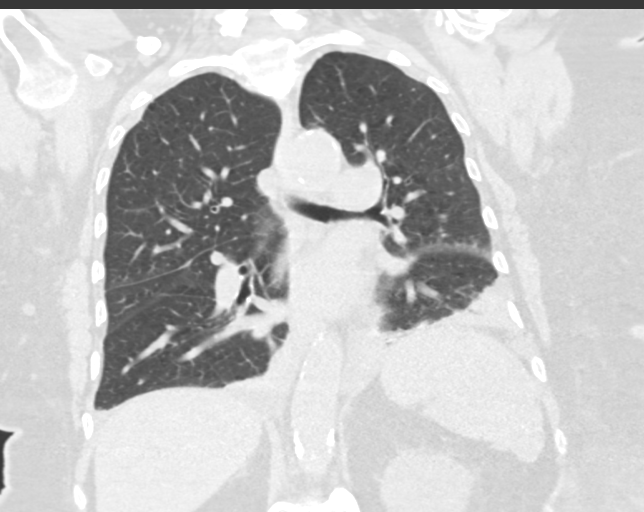

[15 of 36 positions shown; findings below may reference images not displayed]

FINDINGS: Cardiovascular: Right IJ line tip: SVC. Left PICC line tip: Right
atrium.

Coronary, aortic arch, and branch vessel atherosclerotic vascular
disease. Mildly prominent main pulmonary artery raising the
possibility of pulmonary arterial hypertension. Mild cardiomegaly.

Mediastinum/Nodes: Unremarkable

Lungs/Pleura: Small bilateral pleural effusions with passive
atelectasis. Patchy ground-glass density regions with interstitial
accentuation in the upper lobes, favoring edema or inflammatory
etiology based on morphology and patchy appearance.

Airway thickening is present, suggesting bronchitis or reactive
airways disease. Faint patchy subpleural ground-glass opacity in the
left upper lobe.

Upper Abdomen: Abdominal aortic atherosclerosis.

Musculoskeletal: Degenerative sternoclavicular arthropathy
bilaterally. Left glenohumeral arthropathy with possible avascular
necrosis in the left humeral head; hardware in left proximal humerus
on the scout image. Thoracic spondylosis noted.
IMPRESSION: 1. Cardiomegaly with small bilateral pleural effusions and passive
atelectasis. There is some patchy regions of indistinct ground-glass
and interstitial opacity primarily in the upper lobes, query
resolving edema versus alveolitis.
2. Prominent main pulmonary artery raising the possibility of
pulmonary arterial hypertension.
3. Airway thickening is present, suggesting bronchitis or reactive
airways disease.
4. Potential avascular necrosis in the left humeral head, partially
included on today's exam.
5.  Aortic Atherosclerosis (2158T-CMY.Y).  Coronary atherosclerosis.

## 2022-09-24 NOTE — Telephone Encounter (Signed)
Pt returned CR phone call and stated she is interested in rescheduling CR. Patient will come in for orientation on 09/26/22 @ 1:15PM and will attend the 1:45PM exercise class.   Tourist information centre manager.

## 2022-09-24 NOTE — Telephone Encounter (Signed)
Pt insurance is active and benefits verified through Medicare A/B. Co-pay $0.00, DED $240.00/$240.00 met, out of pocket $0.00/$0.00 met, co-insurance 20%. No pre-authorization required. Passport, 09/24/2022 @ 2:31PM, TRV#[id number]   How many CR sessions are covered? (36 sessions for TCR, 72 sessions for ICR)72 Is this a lifetime maximum or an annual maximum? Lifetime Has the member used any of these services to date? No Is there a time limit (weeks/months) on start of program and/or program completion? No     2ndary insurance is active and benefits verified through [employer]. Co-pay $0.00, DED $0.00/$0.00 met, out of pocket $0.00/$0.00 met, co-insurance 0%. No pre-authorization required. Passport, 09/24/2022 @ 2:37PM

## 2022-09-25 ENCOUNTER — Other Ambulatory Visit (HOSPITAL_COMMUNITY): Payer: Self-pay | Admitting: *Deleted

## 2022-09-25 ENCOUNTER — Telehealth (HOSPITAL_COMMUNITY): Payer: Self-pay

## 2022-09-25 ENCOUNTER — Other Ambulatory Visit (HOSPITAL_COMMUNITY): Payer: Self-pay

## 2022-09-25 MED ORDER — APIXABAN 2.5 MG PO TABS: 2.5000 mg | ORAL_TABLET | Freq: Two times a day (BID) | ORAL | 6 refills | Status: DC

## 2022-09-25 NOTE — Telephone Encounter (Signed)
Dr Brittany Jackson,  Patient called and stated her Brittany Jackson is costing her over $400. Per pharmacy team    Test claims return the following:    Brittany Jackson  $104.99 Brittany Jackson  $213.89 Brittany Jackson  $95.46   LABA   Brittany Jackson  $115.04   LAMA    Brittany Jackson  $78.29 Brittany Jackson  $118.89 Brittany Jackson  $233.22 Brittany Jackson  $793.90   Patient may have high co-pays due to a deductible. If she has questions about that, she will need to contact her insurance

## 2022-09-25 NOTE — Telephone Encounter (Signed)
Test claims return the following:   LABA+LAMA  Anoro  $104.99 Stiolto  $213.89 Bevespi  $95.46  LABA  Striverdi  $115.04  LAMA   Incruse  $78.29 Spiriva HH  $118.89 Spiriva Resp  $062.69 Caprice Renshaw  $485.46  Patient may have high co-pays due to a deductible. If she has questions about that, she will need to contact her insurance

## 2022-09-25 NOTE — Telephone Encounter (Signed)
Called and left voicemail for patient to call office back to go over recommendations from Dr Lake Bells.

## 2022-09-25 NOTE — Telephone Encounter (Signed)
Advanced Heart Failure Patient Advocate Encounter  Medication Samples have been left at registration desk for patient pick up. Drug name: Eliquis 2.5 MG Qty: 1x 14 ct package LOT: [id number] Exp.: 04/25 SIG: Take 1 tablet by mouth twice daily   The patient has been instructed regarding the correct time, dose, and frequency of taking this medication, including desired effects and most common side effects.   Clista Bernhardt, CPhT Rx Patient Advocate Phone: [phone]

## 2022-09-26 ENCOUNTER — Encounter (HOSPITAL_COMMUNITY)
Admission: RE | Admit: 2022-09-26 | Discharge: 2022-09-26 | Disposition: A | Discharge status: Home / Self Care | Payer: Medicare Other | Source: Ambulatory Visit | Attending: Cardiology | Admitting: Cardiology

## 2022-09-26 VITALS — BP 120/54 | HR 69 | Ht 65.0 in | Wt 248.5 lb

## 2022-09-26 DIAGNOSIS — I213 ST elevation (STEMI) myocardial infarction of unspecified site: Secondary | ICD-10-CM | POA: Insufficient documentation

## 2022-09-26 DIAGNOSIS — I5032 Chronic diastolic (congestive) heart failure: Secondary | ICD-10-CM | POA: Diagnosis not present

## 2022-09-26 NOTE — Progress Notes (Signed)
Cardiac Rehab Medication Review   Does the patient  feel that his/her medications are working for him/her?  YES   Has the patient been experiencing any side effects to the medications prescribed?   NO  Does the patient measure his/her own blood pressure or blood glucose at home? NO   Does the patient have any problems obtaining medications due to transportation or finances?   YES   Understanding of regimen: good Understanding of indications: good Potential of compliance: good    Comments: Landy has good compliance and understanding of her medications and what they are for. She does not check her BP at home, but does weigh herself every morning. Maeci stated that she could not afford her prescription for eliquis or jardiance without the assistance of coupons and vouchers.     Colbert Ewing, MS 09/26/2022 4:15 PM

## 2022-09-26 NOTE — Progress Notes (Signed)
Cardiac Individual Treatment Plan  Patient Details  Name: Brittany Jackson MRN: [id number] Date of Birth: 14-Oct-1940 Referring Provider:   Flowsheet Row INTENSIVE CARDIAC REHAB ORIENT from 09/26/2022 in [facility] for Heart, Vascular, & [place]  Referring Provider Loralie Champagne, MD       Initial Encounter Date:  [place] from 09/26/2022 in [facility] for Heart, Vascular, & [place]  Date 09/26/22       Visit Diagnosis: Heart failure, diastolic, chronic ([place])  ST elevation myocardial infarction (STEMI), unspecified artery ([place])  Patient's Home Medications on Admission:  Current Outpatient Medications:    acetaminophen (TYLENOL) 500 MG tablet, Take 500-1,000 mg by mouth every 6 (six) hours as needed (pain.)., Disp: , Rfl:    albuterol (VENTOLIN HFA) 108 (90 Base) MCG/ACT inhaler, Inhale 2 puffs into the lungs every 6 (six) hours as needed for wheezing or shortness of breath., Disp: 8 g, Rfl: 6   amiodarone (PACERONE) 200 MG tablet, Take 0.5 tablets (100 mg total) by mouth daily., Disp: 45 tablet, Rfl: 3   apixaban (ELIQUIS) 2.5 MG TABS tablet, Take 1 tablet (2.5 mg total) by mouth 2 (two) times daily., Disp: 60 tablet, Rfl: 6   atorvastatin (LIPITOR) 80 MG tablet, TAKE ONE TABLET BY MOUTH DAILY, Disp: 30 tablet, Rfl: 5   Coenzyme Q10 (COQ10 PO), Take 1 capsule by mouth in the morning., Disp: , Rfl:    colchicine 0.6 MG tablet, Take 0.6 mg by mouth 2 (two) times daily as needed (onset of gout)., Disp: , Rfl:    diazepam (VALIUM) 10 MG tablet, Take 10 mg by mouth 3 (three) times daily as needed for anxiety., Disp: , Rfl:    empagliflozin (JARDIANCE) 10 MG TABS tablet, Take 1 tablet (10 mg total) by mouth daily before breakfast., Disp: 14 tablet, Rfl: 11   hydrocortisone cream 1 %, Apply topically as needed for itching., Disp: 28 g, Rfl: 0   Multiple Vitamins-Minerals (ADULT ONE DAILY GUMMIES PO), Take  1 tablet by mouth in the morning., Disp: , Rfl:    Red Yeast Rice 600 MG CAPS, Take 600 mg by mouth daily., Disp: , Rfl:    torsemide (DEMADEX) 20 MG tablet, Take 1 tablet (20 mg total) by mouth every other day., Disp: 45 tablet, Rfl: 3   umeclidinium-vilanterol (ANORO ELLIPTA) 62.5-25 MCG/ACT AEPB, Inhale 1 puff into the lungs daily. (Patient not taking: Reported on 06/17/2022), Disp: 36 each, Rfl: 5  Past Medical History: Past Medical History:  Diagnosis Date   CHF (congestive heart failure) (HCC)    Hypertension    Urticaria     Tobacco Use: Social History   Tobacco Use  Smoking Status Former   Packs/day: 1.00   Years: 60.00   Total pack years: 60.00   Types: Cigarettes   Start date: 10   Quit date: 01/08/2022   Years since quitting: 0.7  Smokeless Tobacco Never    Labs: Review Flowsheet  More data exists      Latest Ref Rng & Units 01/19/2022 01/20/2022 01/21/2022 01/22/2022 03/19/2022  Labs for ITP Cardiac and Pulmonary Rehab  Cholestrol 0 - 200 mg/dL - - - - 151   LDL (calc) 0 - 99 mg/dL - - - - 71   HDL-C >40 mg/dL - - - - 56   Trlycerides <150 mg/dL - - - - 122   O2 Saturation % 75.1  78.5  87.6  58  47.9  -  Capillary Blood Glucose: Lab Results  Component Value Date   GLUCAP 105 (H) 01/22/2022   GLUCAP 80 01/22/2022   GLUCAP 83 01/22/2022   GLUCAP 93 01/21/2022   GLUCAP 86 01/21/2022     Exercise Target Goals: Exercise Program Goal: Individual exercise prescription set using results from initial 6 min walk test and THRR while considering  patient's activity barriers and safety.   Exercise Prescription Goal: Initial exercise prescription builds to 30-45 minutes a day of aerobic activity, 2-3 days per week.  Home exercise guidelines will be given to patient during program as part of exercise prescription that the participant will acknowledge.  Activity Barriers & Risk Stratification:  Activity Barriers & Cardiac Risk Stratification - 09/26/22 1558        Activity Barriers & Cardiac Risk Stratification   Activity Barriers Deconditioning;Muscular Weakness;Shortness of Breath;Decreased Ventricular Function;History of Falls    Cardiac Risk Stratification High   <5 METs on 6MWT            6 Minute Walk:  6 Minute Walk     Row Name 09/26/22 1555         6 Minute Walk   Phase Initial     Distance 960 feet     Walk Time 6 minutes     # of Rest Breaks 1  2:27-3:50 SOB, SpO2 100%     MPH 1.8     METS 1.2     RPE 13     Perceived Dyspnea  1     VO2 Peak 4.22     Symptoms Yes (comment)     Comments SOB, resolved with rest.     Resting HR 69 bpm     Resting BP 120/54     Resting Oxygen Saturation  96 %     Exercise Oxygen Saturation  during 6 min walk 97 %     Max Ex. HR 110 bpm     Max Ex. BP 168/52     2 Minute Post BP 124/52              Oxygen Initial Assessment:   Oxygen Re-Evaluation:   Oxygen Discharge (Final Oxygen Re-Evaluation):   Initial Exercise Prescription:  Initial Exercise Prescription - 09/26/22 1500       Date of Initial Exercise RX and Referring Provider   Date 09/26/22    Referring Provider Loralie Champagne, MD    Expected Discharge Date 12/06/22      NuStep   Level 1    SPM 70    Minutes 25    METs 2      Prescription Details   Frequency (times per week) 3    Duration Progress to 30 minutes of continuous aerobic without signs/symptoms of physical distress      Intensity   THRR 40-80% of Max Heartrate 55-110    Ratings of Perceived Exertion 11-13    Perceived Dyspnea 0-4      Progression   Progression Continue to progress workloads to maintain intensity without signs/symptoms of physical distress.      Resistance Training   Training Prescription Yes    Weight 2    Reps 10-15             Perform Capillary Blood Glucose checks as needed.  Exercise Prescription Changes:   Exercise Comments:   Exercise Goals and Review:   Exercise Goals     Row Name 09/26/22 1600  Exercise Goals   Increase Physical Activity Yes       Intervention Provide advice, education, support and counseling about physical activity/exercise needs.;Develop an individualized exercise prescription for aerobic and resistive training based on initial evaluation findings, risk stratification, comorbidities and participant's personal goals.       Expected Outcomes Short Term: Attend rehab on a regular basis to increase amount of physical activity.;Long Term: Exercising regularly at least 3-5 days a week.;Long Term: Add in home exercise to make exercise part of routine and to increase amount of physical activity.       Increase Strength and Stamina Yes       Intervention Provide advice, education, support and counseling about physical activity/exercise needs.;Develop an individualized exercise prescription for aerobic and resistive training based on initial evaluation findings, risk stratification, comorbidities and participant's personal goals.       Expected Outcomes Short Term: Increase workloads from initial exercise prescription for resistance, speed, and METs.;Short Term: Perform resistance training exercises routinely during rehab and add in resistance training at home;Long Term: Improve cardiorespiratory fitness, muscular endurance and strength as measured by increased METs and functional capacity (6MWT)       Able to understand and use rate of perceived exertion (RPE) scale Yes       Intervention Provide education and explanation on how to use RPE scale       Expected Outcomes Short Term: Able to use RPE daily in rehab to express subjective intensity level;Long Term:  Able to use RPE to guide intensity level when exercising independently       Able to understand and use Dyspnea scale Yes       Intervention Provide education and explanation on how to use Dyspnea scale       Expected Outcomes Short Term: Able to use Dyspnea scale daily in rehab to express subjective sense of  shortness of breath during exertion;Long Term: Able to use Dyspnea scale to guide intensity level when exercising independently       Knowledge and understanding of Target Heart Rate Range (THRR) Yes       Intervention Provide education and explanation of THRR including how the numbers were predicted and where they are located for reference       Expected Outcomes Short Term: Able to state/look up THRR;Long Term: Able to use THRR to govern intensity when exercising independently;Short Term: Able to use daily as guideline for intensity in rehab       Understanding of Exercise Prescription Yes       Intervention Provide education, explanation, and written materials on patient's individual exercise prescription       Expected Outcomes Short Term: Able to explain program exercise prescription;Long Term: Able to explain home exercise prescription to exercise independently                Exercise Goals Re-Evaluation :   Discharge Exercise Prescription (Final Exercise Prescription Changes):   Nutrition:  Target Goals: Understanding of nutrition guidelines, daily intake of sodium '1500mg'$ , cholesterol '200mg'$ , calories 30% from fat and 7% or less from saturated fats, daily to have 5 or more servings of fruits and vegetables.  Biometrics:  Pre Biometrics - 09/26/22 1428       Pre Biometrics   Waist Circumference 53 inches    Hip Circumference 57 inches    Waist to Hip Ratio 0.93 %    Triceps Skinfold 45 mm    % Body Fat 56.3 %    Grip Strength 28  kg    Flexibility 13.5 in    Single Leg Stand 3.25 seconds              Nutrition Therapy Plan and Nutrition Goals:   Nutrition Assessments:  MEDIFICTS Score Key: ?70 Need to make dietary changes  40-70 Heart Healthy Diet ? 40 Therapeutic Level Cholesterol Diet    Picture Your Plate Scores: <62 Unhealthy dietary pattern with much room for improvement. 41-50 Dietary pattern unlikely to meet recommendations for good health and  room for improvement. 51-60 More healthful dietary pattern, with some room for improvement.  >60 Healthy dietary pattern, although there may be some specific behaviors that could be improved.    Nutrition Goals Re-Evaluation:   Nutrition Goals Re-Evaluation:   Nutrition Goals Discharge (Final Nutrition Goals Re-Evaluation):   Psychosocial: Target Goals: Acknowledge presence or absence of significant depression and/or stress, maximize coping skills, provide positive support system. Participant is able to verbalize types and ability to use techniques and skills needed for reducing stress and depression.  Initial Review & Psychosocial Screening:  Initial Psych Review & Screening - 09/26/22 1601       Initial Review   Current issues with Current Stress Concerns;Current Depression;Current Anxiety/Panic    Source of Stress Concerns Family;Financial;Unable to participate in former interests or hobbies    Comments [place] has an estranged son that she is no longer speaking to which she admits has caused some grief. She also worries about her sisters health, and her finances as she is living on social security. Reassurance and support was offered, Kyla is interested in speaking with a counselor about her anxiety and depression.      Family Dynamics   Good Support System? Yes   Khori has good support from her daughter     Barriers   Psychosocial barriers to participate in program The patient should benefit from training in stress management and relaxation.      Screening Interventions   Interventions Encouraged to exercise;To provide support and resources with identified psychosocial needs;Provide feedback about the scores to participant    Expected Outcomes Short Term goal: Utilizing psychosocial counselor, staff and physician to assist with identification of specific Stressors or current issues interfering with healing process. Setting desired goal for each stressor or current issue  identified.;Long Term Goal: Stressors or current issues are controlled or eliminated.;Short Term goal: Identification and review with participant of any Quality of Life or Depression concerns found by scoring the questionnaire.;Long Term goal: The participant improves quality of Life and PHQ9 Scores as seen by post scores and/or verbalization of changes             Quality of Life Scores:  Quality of Life - 09/26/22 1605       Quality of Life   Select Quality of Life      Quality of Life Scores   Health/Function Pre 21.5 %    Socioeconomic Pre 24 %    Psych/Spiritual Pre 22.93 %    Family Pre 24 %    GLOBAL Pre 22.63 %            Scores of 19 and below usually indicate a poorer quality of life in these areas.  A difference of  2-3 points is a clinically meaningful difference.  A difference of 2-3 points in the total score of the Quality of Life Index has been associated with significant improvement in overall quality of life, self-image, physical symptoms, and general health in studies assessing change  in quality of life.  PHQ-9: Review Flowsheet       09/26/2022  Depression screen PHQ 2/9  Decreased Interest 0  Down, Depressed, Hopeless 1  PHQ - 2 Score 1  Altered sleeping 0  Tired, decreased energy 2  Change in appetite 3  Feeling bad or failure about yourself  2  Trouble concentrating 0  Moving slowly or fidgety/restless 1  Suicidal thoughts 0  PHQ-9 Score 9  Difficult doing work/chores Not difficult at all   Interpretation of Total Score  Total Score Depression Severity:  1-4 = Minimal depression, 5-9 = Mild depression, 10-14 = Moderate depression, 15-19 = Moderately severe depression, 20-27 = Severe depression   Psychosocial Evaluation and Intervention:   Psychosocial Re-Evaluation:   Psychosocial Discharge (Final Psychosocial Re-Evaluation):   Vocational Rehabilitation: Provide vocational rehab assistance to qualifying candidates.   Vocational Rehab  Evaluation & Intervention:  Vocational Rehab - 09/26/22 1609       Initial Vocational Rehab Evaluation & Intervention   Assessment shows need for Vocational Rehabilitation No   Ladavia is retired and does not require vocational rehab            Education: Education Goals: Education classes will be provided on a weekly basis, covering required topics. Participant will state understanding/return demonstration of topics presented.     Core Videos: Exercise    Move It!  Clinical staff conducted group or individual video education with verbal and written material and guidebook.  Patient learns the recommended Pritikin exercise program. Exercise with the goal of living a long, healthy life. Some of the health benefits of exercise include controlled diabetes, healthier blood pressure levels, improved cholesterol levels, improved heart and lung capacity, improved sleep, and better body composition. Everyone should speak with their doctor before starting or changing an exercise routine.  Biomechanical Limitations Clinical staff conducted group or individual video education with verbal and written material and guidebook.  Patient learns how biomechanical limitations can impact exercise and how we can mitigate and possibly overcome limitations to have an impactful and balanced exercise routine.  Body Composition Clinical staff conducted group or individual video education with verbal and written material and guidebook.  Patient learns that body composition (ratio of muscle mass to fat mass) is a key component to assessing overall fitness, rather than body weight alone. Increased fat mass, especially visceral belly fat, can put [place] at increased risk for metabolic syndrome, type 2 diabetes, heart disease, and even death. It is recommended to combine diet and exercise (cardiovascular and resistance training) to improve your body composition. Seek guidance from your physician and exercise physiologist  before implementing an exercise routine.  Exercise Action Plan Clinical staff conducted group or individual video education with verbal and written material and guidebook.  Patient learns the recommended strategies to achieve and enjoy long-term exercise adherence, including variety, self-motivation, self-efficacy, and positive decision making. Benefits of exercise include fitness, good health, weight management, more energy, better sleep, less stress, and overall well-being.  Medical   Heart Disease Risk Reduction Clinical staff conducted group or individual video education with verbal and written material and guidebook.  Patient learns our heart is our most vital organ as it circulates oxygen, nutrients, white blood cells, and hormones throughout the entire body, and carries waste away. Data supports a plant-based eating plan like the Pritikin Program for its effectiveness in slowing progression of and reversing heart disease. The video provides a number of recommendations to address heart disease.   Metabolic Syndrome  and Belly Fat  Clinical staff conducted group or individual video education with verbal and written material and guidebook.  Patient learns what metabolic syndrome is, how it leads to heart disease, and how one can reverse it and keep it from coming back. You have metabolic syndrome if you have 3 of the following 5 criteria: abdominal obesity, high blood pressure, high triglycerides, low HDL cholesterol, and high blood sugar.  Hypertension and Heart Disease Clinical staff conducted group or individual video education with verbal and written material and guidebook.  Patient learns that high blood pressure, or hypertension, is very common in the [place]. Hypertension is largely due to excessive salt intake, but other important risk factors include being overweight, physical inactivity, drinking too much alcohol, smoking, and not eating enough potassium from fruits and  vegetables. High blood pressure is a leading risk factor for heart attack, stroke, congestive heart failure, dementia, kidney failure, and premature death. Long-term effects of excessive salt intake include stiffening of the arteries and thickening of heart muscle and organ damage. Recommendations include ways to reduce hypertension and the risk of heart disease.  Diseases of Our Time - Focusing on Diabetes Clinical staff conducted group or individual video education with verbal and written material and guidebook.  Patient learns why the best way to stop diseases of our time is prevention, through food and other lifestyle changes. Medicine (such as prescription pills and surgeries) is often only a Band-Aid on the problem, not a long-term solution. Most common diseases of our time include obesity, type 2 diabetes, hypertension, heart disease, and cancer. The Pritikin Program is recommended and has been proven to help reduce, reverse, and/or prevent the damaging effects of metabolic syndrome.  Nutrition   Overview of the Pritikin Eating Plan  Clinical staff conducted group or individual video education with verbal and written material and guidebook.  Patient learns about the [place] for disease risk reduction. The [place] emphasizes a wide variety of unrefined, minimally-processed carbohydrates, like fruits, vegetables, whole grains, and legumes. Go, Caution, and Stop food choices are explained. Plant-based and lean animal proteins are emphasized. Rationale provided for low sodium intake for blood pressure control, low added sugars for blood sugar stabilization, and low added fats and oils for coronary artery disease risk reduction and weight management.  Calorie Density  Clinical staff conducted group or individual video education with verbal and written material and guidebook.  Patient learns about calorie density and how it impacts the Pritikin Eating Plan. Knowing the  characteristics of the food you choose will help you decide whether those foods will lead to weight gain or weight loss, and whether you want to consume more or less of them. Weight loss is usually a side effect of the Pritikin Eating Plan because of its focus on low calorie-dense foods.  Label Reading  Clinical staff conducted group or individual video education with verbal and written material and guidebook.  Patient learns about the Pritikin recommended label reading guidelines and corresponding recommendations regarding calorie density, added sugars, sodium content, and whole grains.  Dining Out - Part 1  Clinical staff conducted group or individual video education with verbal and written material and guidebook.  Patient learns that restaurant meals can be sabotaging because they can be so high in calories, fat, sodium, and/or sugar. Patient learns recommended strategies on how to positively address this and avoid unhealthy pitfalls.  Facts on Fats  Clinical staff conducted group or individual video education with verbal and  written material and guidebook.  Patient learns that lifestyle modifications can be just as effective, if not more so, as many medications for lowering your risk of heart disease. A Pritikin lifestyle can help to reduce your risk of inflammation and atherosclerosis (cholesterol build-up, or plaque, in the artery walls). Lifestyle interventions such as dietary choices and physical activity address the cause of atherosclerosis. A review of the types of fats and their impact on blood cholesterol levels, along with dietary recommendations to reduce fat intake is also included.  Nutrition Action Plan  Clinical staff conducted group or individual video education with verbal and written material and guidebook.  Patient learns how to incorporate Pritikin recommendations into their lifestyle. Recommendations include planning and keeping personal health goals in mind as an important  part of their success.  Healthy Mind-Set    Healthy Minds, Bodies, Hearts  Clinical staff conducted group or individual video education with verbal and written material and guidebook.  Patient learns how to identify when they are stressed. Video will discuss the impact of that stress, as well as the many benefits of stress management. Patient will also be introduced to stress management techniques. The way we think, act, and feel has an impact on our hearts.  How Our Thoughts Can Heal Our Hearts  Clinical staff conducted group or individual video education with verbal and written material and guidebook.  Patient learns that negative thoughts can cause depression and anxiety. This can result in negative lifestyle behavior and serious health problems. Cognitive behavioral therapy is an effective method to help control our thoughts in order to change and improve our emotional outlook.  Additional Videos:  Exercise    Improving Performance  Clinical staff conducted group or individual video education with verbal and written material and guidebook.  Patient learns to use a non-linear approach by alternating intensity levels and lengths of time spent exercising to help burn more calories and lose more body fat. Cardiovascular exercise helps improve heart health, metabolism, hormonal balance, blood sugar control, and recovery from fatigue. Resistance training improves strength, endurance, balance, coordination, reaction time, metabolism, and muscle mass. Flexibility exercise improves circulation, posture, and balance. Seek guidance from your physician and exercise physiologist before implementing an exercise routine and learn your capabilities and proper form for all exercise.  Introduction to Yoga  Clinical staff conducted group or individual video education with verbal and written material and guidebook.  Patient learns about yoga, a discipline of the coming together of mind, breath, and body. The  benefits of yoga include improved flexibility, improved range of motion, better posture and core strength, increased lung function, weight loss, and positive self-image. Yoga's heart health benefits include lowered blood pressure, healthier heart rate, decreased cholesterol and triglyceride levels, improved immune function, and reduced stress. Seek guidance from your physician and exercise physiologist before implementing an exercise routine and learn your capabilities and proper form for all exercise.  Medical   Aging: Enhancing Your Quality of Life  Clinical staff conducted group or individual video education with verbal and written material and guidebook.  Patient learns key strategies and recommendations to stay in good physical health and enhance quality of life, such as prevention strategies, having an advocate, securing a [place], and keeping a list of medications and system for tracking them. It also discusses how to avoid risk for bone loss.  Biology of Weight Control  Clinical staff conducted group or individual video education with verbal and written material and  guidebook.  Patient learns that weight gain occurs because we consume more calories than we burn (eating more, moving less). Even if your body weight is normal, you may have higher ratios of fat compared to muscle mass. Too much body fat puts you at increased risk for cardiovascular disease, heart attack, stroke, type 2 diabetes, and obesity-related cancers. In addition to exercise, following the [place] can help reduce your risk.  Decoding Lab Results  Clinical staff conducted group or individual video education with verbal and written material and guidebook.  Patient learns that lab test reflects one measurement whose values change over time and are influenced by many factors, including medication, stress, sleep, exercise, food, hydration, pre-existing medical conditions, and more. It is  recommended to use the knowledge from this video to become more involved with your lab results and evaluate your numbers to speak with your doctor.   Diseases of Our Time - Overview  Clinical staff conducted group or individual video education with verbal and written material and guidebook.  Patient learns that according to the CDC, 50% to 70% of chronic diseases (such as obesity, type 2 diabetes, elevated lipids, hypertension, and heart disease) are avoidable through lifestyle improvements including healthier food choices, listening to satiety cues, and increased physical activity.  Sleep Disorders Clinical staff conducted group or individual video education with verbal and written material and guidebook.  Patient learns how good quality and duration of sleep are important to overall health and well-being. Patient also learns about sleep disorders and how they impact health along with recommendations to address them, including discussing with a physician.  Nutrition  Dining Out - Part 2 Clinical staff conducted group or individual video education with verbal and written material and guidebook.  Patient learns how to plan ahead and communicate in order to maximize their dining experience in a healthy and nutritious manner. Included are recommended food choices based on the type of restaurant the patient is visiting.   Fueling a Best boy conducted group or individual video education with verbal and written material and guidebook.  There is a strong connection between our food choices and our health. Diseases like obesity and type 2 diabetes are very prevalent and are in large-part due to lifestyle choices. The Pritikin Eating Plan provides plenty of food and hunger-curbing satisfaction. It is easy to follow, affordable, and helps reduce health risks.  Menu Workshop  Clinical staff conducted group or individual video education with verbal and written material and guidebook.   Patient learns that restaurant meals can sabotage health goals because they are often packed with calories, fat, sodium, and sugar. Recommendations include strategies to plan ahead and to communicate with the manager, chef, or server to help order a healthier meal.  Planning Your Eating Strategy  Clinical staff conducted group or individual video education with verbal and written material and guidebook.  Patient learns about the [place] and its benefit of reducing the risk of disease. The [place] does not focus on calories. Instead, it emphasizes high-quality, nutrient-rich foods. By knowing the characteristics of the foods, we choose, we can determine their calorie density and make informed decisions.  Targeting Your Nutrition Priorities  Clinical staff conducted group or individual video education with verbal and written material and guidebook.  Patient learns that lifestyle habits have a tremendous impact on disease risk and progression. This video provides eating and physical activity recommendations based on your personal health goals, such as reducing LDL  cholesterol, losing weight, preventing or controlling type 2 diabetes, and reducing high blood pressure.  Vitamins and Minerals  Clinical staff conducted group or individual video education with verbal and written material and guidebook.  Patient learns different ways to obtain key vitamins and minerals, including through a recommended healthy diet. It is important to discuss all supplements you take with your doctor.   Healthy Mind-Set    Smoking Cessation  Clinical staff conducted group or individual video education with verbal and written material and guidebook.  Patient learns that cigarette smoking and tobacco addiction pose a serious health risk which affects millions of people. Stopping smoking will significantly reduce the risk of heart disease, lung disease, and many forms of cancer. Recommended  strategies for quitting are covered, including working with your doctor to develop a successful plan.  Culinary   Becoming a Financial trader conducted group or individual video education with verbal and written material and guidebook.  Patient learns that cooking at home can be healthy, cost-effective, quick, and puts them in control. Keys to cooking healthy recipes will include looking at your recipe, assessing your equipment needs, planning ahead, making it simple, choosing cost-effective seasonal ingredients, and limiting the use of added fats, salts, and sugars.  Cooking - Breakfast and Snacks  Clinical staff conducted group or individual video education with verbal and written material and guidebook.  Patient learns how important breakfast is to satiety and nutrition through the entire day. Recommendations include key foods to eat during breakfast to help stabilize blood sugar levels and to prevent overeating at meals later in the day. Planning ahead is also a key component.  Cooking - Human resources officer conducted group or individual video education with verbal and written material and guidebook.  Patient learns eating strategies to improve overall health, including an approach to cook more at home. Recommendations include thinking of animal protein as a side on your plate rather than center stage and focusing instead on lower calorie dense options like vegetables, fruits, whole grains, and plant-based proteins, such as beans. Making sauces in large quantities to freeze for later and leaving the skin on your vegetables are also recommended to maximize your experience.  Cooking - Healthy Salads and Dressing Clinical staff conducted group or individual video education with verbal and written material and guidebook.  Patient learns that vegetables, fruits, whole grains, and legumes are the foundations of the [place]. Recommendations include how to  incorporate each of these in flavorful and healthy salads, and how to create homemade salad dressings. Proper handling of ingredients is also covered. Cooking - Soups and [employer] - Soups and Desserts Clinical staff conducted group or individual video education with verbal and written material and guidebook.  Patient learns that Pritikin soups and desserts make for easy, nutritious, and delicious snacks and meal components that are low in sodium, fat, sugar, and calorie density, while high in vitamins, minerals, and filling fiber. Recommendations include simple and healthy ideas for soups and desserts.   Overview     The Pritikin Solution Program Overview Clinical staff conducted group or individual video education with verbal and written material and guidebook.  Patient learns that the results of the [place] Program have been documented in more than 100 articles published in peer-reviewed journals, and the benefits include reducing risk factors for (and, in some cases, even reversing) high cholesterol, high blood pressure, type 2 diabetes, obesity, and more! An overview of the three  key pillars of the Pritikin Program will be covered: eating well, doing regular exercise, and having a healthy mind-set.  WORKSHOPS  Exercise: Exercise Basics: Building Your Action Plan Clinical staff led group instruction and group discussion with PowerPoint presentation and patient guidebook. To enhance the learning environment the use of posters, models and videos may be added. At the conclusion of this workshop, patients will comprehend the difference between physical activity and exercise, as well as the benefits of incorporating both, into their routine. Patients will understand the FITT (Frequency, Intensity, Time, and Type) principle and how to use it to build an exercise action plan. In addition, safety concerns and other considerations for exercise and cardiac rehab will be addressed by the  presenter. The purpose of this lesson is to promote a comprehensive and effective weekly exercise routine in order to improve patients' overall level of fitness.   Managing Heart Disease: Your Path to a Healthier Heart Clinical staff led group instruction and group discussion with PowerPoint presentation and patient guidebook. To enhance the learning environment the use of posters, models and videos may be added.At the conclusion of this workshop, patients will understand the anatomy and physiology of the heart. Additionally, they will understand how Pritikin's three pillars impact the risk factors, the progression, and the management of heart disease.  The purpose of this lesson is to provide a high-level overview of the heart, heart disease, and how the Pritikin lifestyle positively impacts risk factors.  Exercise Biomechanics Clinical staff led group instruction and group discussion with PowerPoint presentation and patient guidebook. To enhance the learning environment the use of posters, models and videos may be added. Patients will learn how the structural parts of their bodies function and how these functions impact their daily activities, movement, and exercise. Patients will learn how to promote a neutral spine, learn how to manage pain, and identify ways to improve their physical movement in order to promote healthy living. The purpose of this lesson is to expose patients to common physical limitations that impact physical activity. Participants will learn practical ways to adapt and manage aches and pains, and to minimize their effect on regular exercise. Patients will learn how to maintain good posture while sitting, walking, and lifting.  Balance Training and Fall Prevention  Clinical staff led group instruction and group discussion with PowerPoint presentation and patient guidebook. To enhance the learning environment the use of posters, models and videos may be added. At the  conclusion of this workshop, patients will understand the importance of their sensorimotor skills (vision, proprioception, and the vestibular system) in maintaining their ability to balance as they age. Patients will apply a variety of balancing exercises that are appropriate for their current level of function. Patients will understand the common causes for poor balance, possible solutions to these problems, and ways to modify their physical environment in order to minimize their fall risk. The purpose of this lesson is to teach patients about the importance of maintaining balance as they age and ways to minimize their risk of falling.  WORKSHOPS   Nutrition:  Fueling a Scientist, research (physical sciences) led group instruction and group discussion with PowerPoint presentation and patient guidebook. To enhance the learning environment the use of posters, models and videos may be added. Patients will review the foundational principles of the [place] and understand what constitutes a serving size in each of the food groups. Patients will also learn Pritikin-friendly foods that are better choices when away from home and review  make-ahead meal and snack options. Calorie density will be reviewed and applied to three nutrition priorities: weight maintenance, weight loss, and weight gain. The purpose of this lesson is to reinforce (in a group setting) the key concepts around what patients are recommended to eat and how to apply these guidelines when away from home by planning and selecting Pritikin-friendly options. Patients will understand how calorie density may be adjusted for different weight management goals.  Mindful Eating  Clinical staff led group instruction and group discussion with PowerPoint presentation and patient guidebook. To enhance the learning environment the use of posters, models and videos may be added. Patients will briefly review the concepts of the [place] and the  importance of low-calorie dense foods. The concept of mindful eating will be introduced as well as the importance of paying attention to internal hunger signals. Triggers for non-hunger eating and techniques for dealing with triggers will be explored. The purpose of this lesson is to provide patients with the opportunity to review the basic principles of the [place], discuss the value of eating mindfully and how to measure internal cues of hunger and fullness using the Hunger Scale. Patients will also discuss reasons for non-hunger eating and learn strategies to use for controlling emotional eating.  Targeting Your Nutrition Priorities Clinical staff led group instruction and group discussion with PowerPoint presentation and patient guidebook. To enhance the learning environment the use of posters, models and videos may be added. Patients will learn how to determine their genetic susceptibility to disease by reviewing their family history. Patients will gain insight into the importance of diet as part of an overall healthy lifestyle in mitigating the impact of genetics and other environmental insults. The purpose of this lesson is to provide patients with the opportunity to assess their personal nutrition priorities by looking at their family history, their own health history and current risk factors. Patients will also be able to discuss ways of prioritizing and modifying the [place] for their highest risk areas  Menu  Clinical staff led group instruction and group discussion with PowerPoint presentation and patient guidebook. To enhance the learning environment the use of posters, models and videos may be added. Using menus brought in from [employer], or printed from [employer], patients will apply the [place] dining out guidelines that were presented in the [employer] video. Patients will also be able to practice these guidelines in a variety of  provided scenarios. The purpose of this lesson is to provide patients with the opportunity to practice hands-on learning of the [place] with actual menus and practice scenarios.  Label Reading Clinical staff led group instruction and group discussion with PowerPoint presentation and patient guidebook. To enhance the learning environment the use of posters, models and videos may be added. Patients will review and discuss the Pritikin label reading guidelines presented in Pritikin's Label Reading Educational series video. Using fool labels brought in from local grocery stores and markets, patients will apply the label reading guidelines and determine if the packaged food meet the Pritikin guidelines. The purpose of this lesson is to provide patients with the opportunity to review, discuss, and practice hands-on learning of the Pritikin Label Reading guidelines with actual packaged food labels. [place] Workshops are designed to teach patients ways to prepare quick, simple, and affordable recipes at home. The importance of nutrition's role in chronic disease risk reduction is reflected in its emphasis in  the overall Pritikin program. By learning how to prepare essential core Pritikin Eating Plan recipes, patients will increase control over what they eat; be able to customize the flavor of foods without the use of added salt, sugar, or fat; and improve the quality of the food they consume. By learning a set of core recipes which are easily assembled, quickly prepared, and affordable, patients are more likely to prepare more healthy foods at home. These workshops focus on convenient breakfasts, simple entres, side dishes, and desserts which can be prepared with minimal effort and are consistent with nutrition recommendations for cardiovascular risk reduction. Cooking [employer] are taught by a Engineer, materials (RD) who has been trained by the  [employer]. The chef or RD has a clear understanding of the importance of minimizing - if not completely eliminating - added fat, sugar, and sodium in recipes. Throughout the series of [place] Workshop sessions, patients will learn about healthy ingredients and efficient methods of cooking to build confidence in their capability to prepare    Cooking School weekly topics:  Adding Flavor- Sodium-Free  Fast and Healthy Breakfasts  Powerhouse Plant-Based Proteins  Satisfying Salads and Dressings  Simple Sides and Sauces  International Cuisine-Spotlight on the [place] Zones  Delicious Desserts  Savory Soups  Efficiency Cooking - Meals in a Snap  Tasty Appetizers and Snacks  Comforting Weekend Breakfasts  One-Pot Wonders   Fast Evening Meals  Easy [place] (Psychosocial): New Thoughts, New Behaviors Clinical staff led group instruction and group discussion with PowerPoint presentation and patient guidebook. To enhance the learning environment the use of posters, models and videos may be added. Patients will learn and practice techniques for developing effective health and lifestyle goals. Patients will be able to effectively apply the goal setting process learned to develop at least one new personal goal.  The purpose of this lesson is to expose patients to a new skill set of behavior modification techniques such as techniques setting SMART goals, overcoming barriers, and achieving new thoughts and new behaviors.  Managing Moods and Relationships Clinical staff led group instruction and group discussion with PowerPoint presentation and patient guidebook. To enhance the learning environment the use of posters, models and videos may be added. Patients will learn how emotional and chronic stress factors can impact their health and relationships. They will learn healthy ways to manage their moods and utilize  positive coping mechanisms. In addition, ICR patients will learn ways to improve communication skills. The purpose of this lesson is to expose patients to ways of understanding how one's mood and health are intimately connected. Developing a healthy outlook can help build positive relationships and connections with others. Patients will understand the importance of utilizing effective communication skills that include actively listening and being heard. They will learn and understand the importance of the "4 Cs" and especially Connections in fostering of a Healthy Mind-Set.  Healthy Sleep for a Healthy Heart Clinical staff led group instruction and group discussion with PowerPoint presentation and patient guidebook. To enhance the learning environment the use of posters, models and videos may be added. At the conclusion of this workshop, patients will be able to demonstrate knowledge of the importance of sleep to overall health, well-being, and quality of life. They will understand the symptoms of, and treatments for, common sleep disorders. Patients will also be able to identify daytime and nighttime behaviors which impact sleep, and they will  be able to apply these tools to help manage sleep-related challenges. The purpose of this lesson is to provide patients with a general overview of sleep and outline the importance of quality sleep. Patients will learn about a few of the most common sleep disorders. Patients will also be introduced to the concept of "sleep hygiene," and discover ways to self-manage certain sleeping problems through simple daily behavior changes. Finally, the workshop will motivate patients by clarifying the links between quality sleep and their goals of heart-healthy living.   Recognizing and Reducing Stress Clinical staff led group instruction and group discussion with PowerPoint presentation and patient guidebook. To enhance the learning environment the use of posters, models and  videos may be added. At the conclusion of this workshop, patients will be able to understand the types of stress reactions, differentiate between acute and chronic stress, and recognize the impact that chronic stress has on their health. They will also be able to apply different coping mechanisms, such as reframing negative self-talk. Patients will have the opportunity to practice a variety of stress management techniques, such as deep abdominal breathing, progressive muscle relaxation, and/or guided imagery.  The purpose of this lesson is to educate patients on the role of stress in their lives and to provide healthy techniques for coping with it.  Learning Barriers/Preferences:  Learning Barriers/Preferences - 09/26/22 1606       Learning Barriers/Preferences   Learning Barriers Sight;Hearing   pt wears glasses and hearing aids   Learning Preferences Audio;Computer/Internet;Group Instruction;Individual Instruction;Pictoral;Skilled Demonstration;Verbal Instruction;Video;Written Material             Education Topics:  Knowledge Questionnaire Score:  Knowledge Questionnaire Score - 09/26/22 1606       Knowledge Questionnaire Score   Pre Score 16/24             Core Components/Risk Factors/Patient Goals at Admission:  Personal Goals and Risk Factors at Admission - 09/26/22 1609       Core Components/Risk Factors/Patient Goals on Admission    Weight Management Yes;Obesity;Weight Loss    Intervention Weight Management: Provide education and appropriate resources to help participant work on and attain dietary goals.;Weight Management: Develop a combined nutrition and exercise program designed to reach desired caloric intake, while maintaining appropriate intake of nutrient and fiber, sodium and fats, and appropriate energy expenditure required for the weight goal.;Weight Management/Obesity: Establish reasonable short term and long term weight goals.;Obesity: Provide education and  appropriate resources to help participant work on and attain dietary goals.    Admit Weight 248 lb 7.3 oz (112.7 kg)    Goal Weight: Long Term 200 lb (90.7 kg)    Expected Outcomes Short Term: Continue to assess and modify interventions until short term weight is achieved;Long Term: Adherence to nutrition and physical activity/exercise program aimed toward attainment of established weight goal;Weight Loss: Understanding of general recommendations for a balanced deficit meal plan, which promotes 1-2 lb weight loss per week and includes a negative energy balance of [phone] kcal/d;Understanding recommendations for meals to include 15-35% energy as protein, 25-35% energy from fat, 35-60% energy from carbohydrates, less than '200mg'$  of dietary cholesterol, 20-35 gm of total fiber daily;Understanding of distribution of calorie intake throughout the day with the consumption of 4-5 meals/snacks    Heart Failure Yes    Intervention Provide a combined exercise and nutrition program that is supplemented with education, support and counseling about heart failure. Directed toward relieving symptoms such as shortness of breath, decreased exercise tolerance, and extremity edema.  Expected Outcomes Improve functional capacity of life;Short term: Attendance in program 2-3 days a week with increased exercise capacity. Reported lower sodium intake. Reported increased fruit and vegetable intake. Reports medication compliance.;Short term: Daily weights obtained and reported for increase. Utilizing diuretic protocols set by physician.;Long term: Adoption of self-care skills and reduction of barriers for early signs and symptoms recognition and intervention leading to self-care maintenance.    Hypertension Yes    Intervention Provide education on lifestyle modifcations including regular physical activity/exercise, weight management, moderate sodium restriction and increased consumption of fresh fruit, vegetables, and low fat  dairy, alcohol moderation, and smoking cessation.;Monitor prescription use compliance.    Expected Outcomes Short Term: Continued assessment and intervention until BP is < 140/23m HG in hypertensive participants. < 130/826mHG in hypertensive participants with diabetes, heart failure or chronic kidney disease.;Long Term: Maintenance of blood pressure at goal levels.    Lipids Yes    Intervention Provide education and support for participant on nutrition & aerobic/resistive exercise along with prescribed medications to achieve LDL '70mg'$ , HDL >'40mg'$ .    Expected Outcomes Short Term: Participant states understanding of desired cholesterol values and is compliant with medications prescribed. Participant is following exercise prescription and nutrition guidelines.;Long Term: Cholesterol controlled with medications as prescribed, with individualized exercise RX and with personalized nutrition plan. Value goals: LDL < '70mg'$ , HDL > 40 mg.    Stress Yes    Intervention Offer individual and/or small group education and counseling on adjustment to heart disease, stress management and health-related lifestyle change. Teach and support self-help strategies.;Refer participants experiencing significant psychosocial distress to appropriate mental health specialists for further evaluation and treatment. When possible, include family members and significant others in education/counseling sessions.    Expected Outcomes Short Term: Participant demonstrates changes in health-related behavior, relaxation and other stress management skills, ability to obtain effective social support, and compliance with psychotropic medications if prescribed.;Long Term: Emotional wellbeing is indicated by absence of clinically significant psychosocial distress or social isolation.             Core Components/Risk Factors/Patient Goals Review:    Core Components/Risk Factors/Patient Goals at Discharge (Final Review):    ITP Comments:   ITP Comments     Row Name 09/26/22 1424           ITP Comments Dr. TrFransico Himedical director. Introduction to [facility] education program/ intensive cardiac rehab. Initial orientation packet reviewed with patient.                Comments: Participant attended orientation for the cardiac rehabilitation program on  09/26/2022  to perform initial intake and exercise walk test. [facility] introduced to the [facility] and orientation packet was reviewed. Completed 6-minute walk test, measurements, initial ITP, and exercise prescription. Vital signs stable. Telemetry-normal sinus rhythm with 1st degree AV block, asymptomatic.   Service time was from 1309 to 1545.

## 2022-09-30 ENCOUNTER — Encounter (HOSPITAL_COMMUNITY): Payer: Medicare Other

## 2022-10-02 ENCOUNTER — Encounter (HOSPITAL_COMMUNITY)
Admission: RE | Admit: 2022-10-02 | Discharge: 2022-10-02 | Disposition: A | Discharge status: Home / Self Care | Payer: Medicare Other | Source: Ambulatory Visit | Attending: Cardiology | Admitting: Cardiology

## 2022-10-02 DIAGNOSIS — I5032 Chronic diastolic (congestive) heart failure: Secondary | ICD-10-CM

## 2022-10-02 DIAGNOSIS — I213 ST elevation (STEMI) myocardial infarction of unspecified site: Secondary | ICD-10-CM

## 2022-10-02 NOTE — Progress Notes (Signed)
Daily Session Note  Patient Details  Name: Brittany Jackson MRN: [id number] Date of Birth: Jan 17, 1941 Referring Provider:   Flowsheet Row INTENSIVE CARDIAC REHAB ORIENT from 09/26/2022 in [facility] [place]  Referring Provider Loralie Champagne, MD       Encounter Date: 10/02/2022  Check In:  Session Check In - 10/02/22 1524       Check-In   Supervising physician immediately available to respond to emergencies [facility] MD immediately available    Physician(s) Ambrose Pancoast, NP    Location [facility]    Staff Present Sandy Salaam, MS, Exercise Physiologist;Ida Milbrath, RN, Milus Glazier, MS, ACSM-CEP, CCRP, Exercise Physiologist;Jetta Gilford Rile BS, ACSM-CEP, Exercise Physiologist;Olinty Celesta Aver, MS, ACSM-CEP, Exercise Physiologist    Virtual Visit No    Medication changes reported     No    Fall or balance concerns reported    No    Tobacco Cessation No Change    Warm-up and Cool-down Performed as group-led instruction    Resistance Training Performed No    VAD Patient? No    PAD/SET Patient? No      Pain Assessment   Currently in Pain? No/denies    Pain Score 0-No pain    Multiple Pain Sites No             Capillary Blood Glucose: No results found for this or any previous visit (from the past 24 hour(s)).   Exercise Prescription Changes - 10/02/22 1615       Response to Exercise   Blood Pressure (Admit) 120/70    Blood Pressure (Exercise) 144/68    Blood Pressure (Exit) 108/80    Heart Rate (Admit) 69 bpm    Heart Rate (Exercise) 109 bpm    Heart Rate (Exit) 75 bpm    Rating of Perceived Exertion (Exercise) 13    Symptoms none    Comments Pt first day in teh [facility]CR program    Duration Progress to 30 minutes of  aerobic without signs/symptoms of physical distress    Intensity THRR unchanged      Progression   Progression Continue to progress workloads to maintain intensity without  signs/symptoms of physical distress.    Average METs 2.3      Resistance Training   Training Prescription No      NuStep   Level 2    SPM 95    Minutes 25    METs 2.3             Social History   Tobacco Use  Smoking Status Former   Packs/day: 1.00   Years: 60.00   Total pack years: 60.00   Types: Cigarettes   Start date: 54   Quit date: 01/08/2022   Years since quitting: 0.7  Smokeless Tobacco Never    Goals Met:  Exercise tolerated well No report of concerns or symptoms today  Goals Unmet:  Not Applicable  Comments: Brittany Jackson started cardiac rehab today.  Pt tolerated light exercise without difficulty. VSS, telemetry-Sinus Rhythm, asymptomatic.  Medication list reconciled. Pt denies barriers to medicaiton compliance.  PSYCHOSOCIAL ASSESSMENT:  PHQ-9. Pt exhibits positive coping skills, hopeful outlook with supportive family. No psychosocial needs identified at this time, no psychosocial interventions necessary.  Brittany Jackson says she has a history of depression she denies being depressed. Brittany Jackson says that she takes valium when she is nervous but denies being depressed currently. Brittany Jackson says she does not want to take another pill.  Pt [place] the [place] and watching TV.   Pt oriented to exercise equipment and routine.  Patient's gait is noted to be unsteady. I asked Brittany Jackson to use a rollator when she returns to exercise. I assisted Brittany Jackson on and off of the equipment she used a chair  in front of her when doing group stretches.  Understanding verbalized.Brittany Gave RN BSN    Dr. Fransico Him is Medical Director for Cardiac Rehab at [facility].

## 2022-10-03 ENCOUNTER — Telehealth: Payer: Self-pay | Admitting: Pulmonary Disease

## 2022-10-03 NOTE — Telephone Encounter (Signed)
Per last phone note regarding this   Retia Passe, RN      09/25/22  4:29 PM Note Called and left voicemail for patient to call office back to go over recommendations from Dr Lake Bells.          Juanito Doom, MD  to Monna Fam L, RN     09/25/22  4:22 PM Let's change to Bevespi 2 puff bid  Called the pt and there was no answer- LMTCB.

## 2022-10-04 ENCOUNTER — Encounter (HOSPITAL_COMMUNITY)
Admission: RE | Admit: 2022-10-04 | Discharge: 2022-10-04 | Disposition: A | Discharge status: Home / Self Care | Payer: Medicare Other | Source: Ambulatory Visit | Attending: Cardiology | Admitting: Cardiology

## 2022-10-04 DIAGNOSIS — I5032 Chronic diastolic (congestive) heart failure: Secondary | ICD-10-CM

## 2022-10-04 DIAGNOSIS — I213 ST elevation (STEMI) myocardial infarction of unspecified site: Secondary | ICD-10-CM

## 2022-10-04 NOTE — Progress Notes (Signed)
Patient here for second day of exercise. Reports feeling slightly nauseated. Blood pressure 114/78. Heart rate 57. Oxygen saturation 97% on room air. I advised that Etoile not exercise today if she does not feel well. Patient reported that she ate toast 3 hours ago. Carmina says she is going home to get something to eat and hopes to return on Monday.Barnet Pall, RN,BSN 10/04/2022 2:19 PM

## 2022-10-07 ENCOUNTER — Encounter (HOSPITAL_COMMUNITY)
Admission: RE | Admit: 2022-10-07 | Discharge: 2022-10-07 | Disposition: A | Discharge status: Home / Self Care | Payer: Medicare Other | Source: Ambulatory Visit | Attending: Cardiology | Admitting: Cardiology

## 2022-10-07 DIAGNOSIS — I5032 Chronic diastolic (congestive) heart failure: Secondary | ICD-10-CM

## 2022-10-07 DIAGNOSIS — I213 ST elevation (STEMI) myocardial infarction of unspecified site: Secondary | ICD-10-CM | POA: Diagnosis not present

## 2022-10-09 ENCOUNTER — Encounter (HOSPITAL_COMMUNITY)
Admission: RE | Admit: 2022-10-09 | Discharge: 2022-10-09 | Disposition: A | Discharge status: Home / Self Care | Payer: Medicare Other | Source: Ambulatory Visit | Attending: Cardiology | Admitting: Cardiology

## 2022-10-09 DIAGNOSIS — I213 ST elevation (STEMI) myocardial infarction of unspecified site: Secondary | ICD-10-CM

## 2022-10-09 DIAGNOSIS — I5032 Chronic diastolic (congestive) heart failure: Secondary | ICD-10-CM

## 2022-10-11 ENCOUNTER — Encounter (HOSPITAL_COMMUNITY): Payer: Medicare Other

## 2022-10-14 ENCOUNTER — Telehealth (HOSPITAL_COMMUNITY): Payer: Self-pay | Admitting: *Deleted

## 2022-10-14 ENCOUNTER — Encounter (HOSPITAL_COMMUNITY): Payer: Medicare Other

## 2022-10-14 NOTE — Telephone Encounter (Addendum)
Brittany Jackson said that  while she was vacuuming got caught up in the cord and fell. Brittany Jackson said that she his her arm and it took her a while to get off the floor. I advised Brittany Jackson that she follow up with her primary care provider Dr Gerarda Fraction before returning to exercise. Patient states understanding. I asked Brittany Jackson if she needs to go to the emergency room for evaluation. Patient denies the need to go at this time. Will hold exercise until Brittany Jackson gets clearance to return to exercise. Patient states understanding.Harrell Gave RN BSN

## 2022-10-14 NOTE — Telephone Encounter (Signed)
-----   Message from Erlene Senters sent at 10/14/2022 11:49 AM EST ----- Regarding: pt called out Hey,  Pt called and stated she will not be able to make it in today for CR. Stated she felled about an hour ago and it took her a while to get up. Please f/u with pt.  Thanks, Janett Billow

## 2022-10-15 NOTE — Progress Notes (Signed)
Cardiac Individual Treatment Plan  Patient Details  Name: Brittany Jackson MRN: [id number] Date of Birth: [birth year] Referring Provider:   Flowsheet Row INTENSIVE CARDIAC REHAB ORIENT from 09/26/2022 in [facility] for Heart, [facility], & [place]  Referring Provider Loralie Champagne, MD       Initial Encounter Date:  [place] from 09/26/2022 in [facility], [facility], & [place]  Date 09/26/22       Visit Diagnosis: ST elevation myocardial infarction (STEMI), unspecified artery ([place])  Heart failure, diastolic, chronic ([place])  Patient's Home Medications on Admission:  Current Outpatient Medications:    acetaminophen (TYLENOL) 500 MG tablet, Take 500-1,000 mg by mouth every 6 (six) hours as needed (pain.)., Disp: , Rfl:    albuterol (VENTOLIN HFA) 108 (90 Base) MCG/ACT inhaler, Inhale 2 puffs into the lungs every 6 (six) hours as needed for wheezing or shortness of breath., Disp: 8 g, Rfl: 6   amiodarone (PACERONE) 200 MG tablet, Take 0.5 tablets (100 mg total) by mouth daily., Disp: 45 tablet, Rfl: 3   apixaban (ELIQUIS) 2.5 MG TABS tablet, Take 1 tablet (2.5 mg total) by mouth 2 (two) times daily., Disp: 60 tablet, Rfl: 6   atorvastatin (LIPITOR) 80 MG tablet, TAKE ONE TABLET BY MOUTH DAILY, Disp: 30 tablet, Rfl: 5   Coenzyme Q10 (COQ10 PO), Take 1 capsule by mouth in the morning., Disp: , Rfl:    colchicine 0.6 MG tablet, Take 0.6 mg by mouth 2 (two) times daily as needed (onset of gout)., Disp: , Rfl:    diazepam (VALIUM) 10 MG tablet, Take 10 mg by mouth 3 (three) times daily as needed for anxiety., Disp: , Rfl:    empagliflozin (JARDIANCE) 10 MG TABS tablet, Take 1 tablet (10 mg total) by mouth daily before breakfast., Disp: 14 tablet, Rfl: 11   hydrocortisone cream 1 %, Apply topically as needed for itching., Disp: 28 g, Rfl: 0   Multiple Vitamins-Minerals (ADULT ONE DAILY GUMMIES PO), Take  1 tablet by mouth in the morning., Disp: , Rfl:    Red Yeast Rice 600 MG CAPS, Take 600 mg by mouth daily., Disp: , Rfl:    torsemide (DEMADEX) 20 MG tablet, Take 1 tablet (20 mg total) by mouth every other day., Disp: 45 tablet, Rfl: 3   umeclidinium-vilanterol (ANORO ELLIPTA) 62.5-25 MCG/ACT AEPB, Inhale 1 puff into the lungs daily. (Patient not taking: Reported on 06/17/2022), Disp: 25 each, Rfl: 5  Past Medical History: Past Medical History:  Diagnosis Date   CHF (congestive heart failure) (HCC)    Hypertension    Urticaria     Tobacco Use: Social History   Tobacco Use  Smoking Status Former   Packs/day: 1.00   Years: 60.00   Total pack years: 60.00   Types: Cigarettes   Start date: 14   Quit date: 01/08/2022   Years since quitting: 0.7  Smokeless Tobacco Never    Labs: Review Flowsheet  More data exists      Latest Ref Rng & Units 01/19/2022 01/20/2022 01/21/2022 01/22/2022 03/19/2022  Labs for ITP Cardiac and Pulmonary Rehab  Cholestrol 0 - 200 mg/dL - - - - 151   LDL (calc) 0 - 99 mg/dL - - - - 71   HDL-C >40 mg/dL - - - - 56   Trlycerides <150 mg/dL - - - - 122   O2 Saturation % 75.1  78.5  87.6  58  47.9  -  Capillary Blood Glucose: Lab Results  Component Value Date   GLUCAP 105 (H) 01/22/2022   GLUCAP 80 01/22/2022   GLUCAP 83 01/22/2022   GLUCAP 93 01/21/2022   GLUCAP 86 01/21/2022     Exercise Target Goals: Exercise Program Goal: Individual exercise prescription set using results from initial 6 min walk test and THRR while considering  patient's activity barriers and safety.   Exercise Prescription Goal: Initial exercise prescription builds to 30-45 minutes a day of aerobic activity, 2-3 days per week.  Home exercise guidelines will be given to patient during program as part of exercise prescription that the participant will acknowledge.  Activity Barriers & Risk Stratification:  Activity Barriers & Cardiac Risk Stratification - 09/26/22 1558        Activity Barriers & Cardiac Risk Stratification   Activity Barriers Deconditioning;Muscular Weakness;Shortness of Breath;Decreased Ventricular Function;History of Falls    Cardiac Risk Stratification High   <5 METs on 6MWT            6 Minute Walk:  6 Minute Walk     Row Name 09/26/22 1555         6 Minute Walk   Phase Initial     Distance 960 feet     Walk Time 6 minutes     # of Rest Breaks 1  2:27-3:50 SOB, SpO2 100%     MPH 1.8     METS 1.2     RPE 13     Perceived Dyspnea  1     VO2 Peak 4.22     Symptoms Yes (comment)     Comments SOB, resolved with rest.     Resting HR 69 bpm     Resting BP 120/54     Resting Oxygen Saturation  96 %     Exercise Oxygen Saturation  during 6 min walk 97 %     Max Ex. HR 110 bpm     Max Ex. BP 168/52     2 Minute Post BP 124/52              Oxygen Initial Assessment:   Oxygen Re-Evaluation:   Oxygen Discharge (Final Oxygen Re-Evaluation):   Initial Exercise Prescription:  Initial Exercise Prescription - 09/26/22 1500       Date of Initial Exercise RX and Referring Provider   Date 09/26/22    Referring Provider Loralie Champagne, MD    Expected Discharge Date 12/06/22      NuStep   Level 1    SPM 70    Minutes 25    METs 2      Prescription Details   Frequency (times per week) 3    Duration Progress to 30 minutes of continuous aerobic without signs/symptoms of physical distress      Intensity   THRR 40-80% of Max Heartrate 55-110    Ratings of Perceived Exertion 11-13    Perceived Dyspnea 0-4      Progression   Progression Continue to progress workloads to maintain intensity without signs/symptoms of physical distress.      Resistance Training   Training Prescription Yes    Weight 2    Reps 10-15             Perform Capillary Blood Glucose checks as needed.  Exercise Prescription Changes:   Exercise Prescription Changes     Row Name 10/02/22 1615             Response to Exercise    Blood  Pressure (Admit) 120/70       Blood Pressure (Exercise) 144/68       Blood Pressure (Exit) 108/80       Heart Rate (Admit) 69 bpm       Heart Rate (Exercise) 109 bpm       Heart Rate (Exit) 75 bpm       Rating of Perceived Exertion (Exercise) 13       Symptoms none       Comments Pt first day in teh Pritikin ICR program       Duration Progress to 30 minutes of  aerobic without signs/symptoms of physical distress       Intensity THRR unchanged         Progression   Progression Continue to progress workloads to maintain intensity without signs/symptoms of physical distress.       Average METs 2.3         Resistance Training   Training Prescription No         NuStep   Level 2       SPM 95       Minutes 25       METs 2.3                Exercise Comments:   Exercise Comments     Row Name 10/02/22 1620           Exercise Comments Pt first day in the CRP2 program, Pt tolerated exercise well with an average MET level of 2.3. Pt is learning her THRR, RPE and ExRx                Exercise Goals and Review:   Exercise Goals     Row Name 09/26/22 1600             Exercise Goals   Increase Physical Activity Yes       Intervention Provide advice, education, support and counseling about physical activity/exercise needs.;Develop an individualized exercise prescription for aerobic and resistive training based on initial evaluation findings, risk stratification, comorbidities and participant's personal goals.       Expected Outcomes Short Term: Attend rehab on a regular basis to increase amount of physical activity.;Long Term: Exercising regularly at least 3-5 days a week.;Long Term: Add in home exercise to make exercise part of routine and to increase amount of physical activity.       Increase Strength and Stamina Yes       Intervention Provide advice, education, support and counseling about physical activity/exercise needs.;Develop an individualized exercise  prescription for aerobic and resistive training based on initial evaluation findings, risk stratification, comorbidities and participant's personal goals.       Expected Outcomes Short Term: Increase workloads from initial exercise prescription for resistance, speed, and METs.;Short Term: Perform resistance training exercises routinely during rehab and add in resistance training at home;Long Term: Improve cardiorespiratory fitness, muscular endurance and strength as measured by increased METs and functional capacity (6MWT)       Able to understand and use rate of perceived exertion (RPE) scale Yes       Intervention Provide education and explanation on how to use RPE scale       Expected Outcomes Short Term: Able to use RPE daily in rehab to express subjective intensity level;Long Term:  Able to use RPE to guide intensity level when exercising independently       Able to understand and use Dyspnea scale Yes  Intervention Provide education and explanation on how to use Dyspnea scale       Expected Outcomes Short Term: Able to use Dyspnea scale daily in rehab to express subjective sense of shortness of breath during exertion;Long Term: Able to use Dyspnea scale to guide intensity level when exercising independently       Knowledge and understanding of Target Heart Rate Range (THRR) Yes       Intervention Provide education and explanation of THRR including how the numbers were predicted and where they are located for reference       Expected Outcomes Short Term: Able to state/look up THRR;Long Term: Able to use THRR to govern intensity when exercising independently;Short Term: Able to use daily as guideline for intensity in rehab       Understanding of Exercise Prescription Yes       Intervention Provide education, explanation, and written materials on patient's individual exercise prescription       Expected Outcomes Short Term: Able to explain program exercise prescription;Long Term: Able to explain  home exercise prescription to exercise independently                Exercise Goals Re-Evaluation :  Exercise Goals Re-Evaluation     [place] Name 10/02/22 1618             Exercise Goal Re-Evaluation   Exercise Goals Review Increase Physical Activity;Understanding of Exercise Prescription;Increase Strength and Stamina;Knowledge and understanding of Target Heart Rate Range (THRR);Able to understand and use rate of perceived exertion (RPE) scale       Comments Pt first day in the CRP2 program, Pt tolerated exercise well with an average MET level of 2.3. Pt is learning her THRR, RPE and ExRx       Expected Outcomes Will continue to monitor pt and progress workloads as tolerated without sign or symptom.                Discharge Exercise Prescription (Final Exercise Prescription Changes):  Exercise Prescription Changes - 10/02/22 1615       Response to Exercise   Blood Pressure (Admit) 120/70    Blood Pressure (Exercise) 144/68    Blood Pressure (Exit) 108/80    Heart Rate (Admit) 69 bpm    Heart Rate (Exercise) 109 bpm    Heart Rate (Exit) 75 bpm    Rating of Perceived Exertion (Exercise) 13    Symptoms none    Comments Pt first day in teh Pritikin ICR program    Duration Progress to 30 minutes of  aerobic without signs/symptoms of physical distress    Intensity THRR unchanged      Progression   Progression Continue to progress workloads to maintain intensity without signs/symptoms of physical distress.    Average METs 2.3      Resistance Training   Training Prescription No      NuStep   Level 2    SPM 95    Minutes 25    METs 2.3             Nutrition:  Target Goals: Understanding of nutrition guidelines, daily intake of sodium '1500mg'$ , cholesterol '200mg'$ , calories 30% from fat and 7% or less from saturated fats, daily to have 5 or more servings of fruits and vegetables.  Biometrics:  Pre Biometrics - 09/26/22 1428       Pre Biometrics   Waist  Circumference 53 inches    Hip Circumference 57 inches    Waist to Hip Ratio  0.93 %    Triceps Skinfold 45 mm    % Body Fat 56.3 %    Grip Strength 28 kg    Flexibility 13.5 in    Single Leg Stand 3.25 seconds              Nutrition Therapy Plan and Nutrition Goals:  Nutrition Therapy & Goals - 10/03/22 0853       Nutrition Therapy   Diet Heart healthy diet    Drug/Food Interactions Statins/Certain Fruits      Personal Nutrition Goals   Nutrition Goal Patient to identify strategies for reducing cardiovascular risk by attending the weekly Pritikin education and nutrition series    Personal Goal #2 Patient to improve diet quality by using the plate method as a daily guide for meal planning to include lean protein/plant protein, fruits, vegetables, whole grains, nonfat dairy as part of well balanced diet    Personal Goal #3 Patient to reduce sodium intake to '1500mg'$  per day    Comments Liddy follows with outside nephrology; not able to view notes/labs. Naureen will continue to benefit from particiation in intensive cardiac rehab for nutrition, exercise, and lifestyle modification.      Intervention Plan   Intervention Prescribe, educate and counsel regarding individualized specific dietary modifications aiming towards targeted core components such as weight, hypertension, lipid management, diabetes, heart failure and other comorbidities.;Nutrition handout(s) given to patient.    Expected Outcomes Short Term Goal: Understand basic principles of dietary content, such as calories, fat, sodium, cholesterol and nutrients.;Long Term Goal: Adherence to prescribed nutrition plan.             Nutrition Assessments:  MEDIFICTS Score Key: ?70 Need to make dietary changes  40-70 Heart Healthy Diet ? 40 Therapeutic Level Cholesterol Diet    Picture Your Plate Scores: [id number] Unhealthy dietary pattern with much room for improvement. 41-50 Dietary pattern unlikely to meet recommendations for  good health and room for improvement. 51-60 More healthful dietary pattern, with some room for improvement.  >60 Healthy dietary pattern, although there may be some specific behaviors that could be improved.    Nutrition Goals Re-Evaluation:  Nutrition Goals Re-Evaluation     [place] Name 10/03/22 0853             Goals   Current Weight 245 lb 9.5 oz (111.4 kg)       Comment lipids WNL, Cr 1.99, GFR 24       Expected Outcome Kathryn follows with outside nephrology; not able to view notes/labs. She continues regular follow-up with the heart failure clinic. Alizea will continue to benefit from particiation in intensive cardiac rehab for nutrition, exercise, and lifestyle modification.                Nutrition Goals Re-Evaluation:  Nutrition Goals Re-Evaluation     [place] Name 10/03/22 0853             Goals   Current Weight 245 lb 9.5 oz (111.4 kg)       Comment lipids WNL, Cr 1.99, GFR 24       Expected Outcome Genora follows with outside nephrology; not able to view notes/labs. She continues regular follow-up with the heart failure clinic. Jolissa will continue to benefit from particiation in intensive cardiac rehab for nutrition, exercise, and lifestyle modification.                Nutrition Goals Discharge (Final Nutrition Goals Re-Evaluation):  Nutrition Goals Re-Evaluation - 10/03/22 [id number]  Goals   Current Weight 245 lb 9.5 oz (111.4 kg)    Comment lipids WNL, Cr 1.99, GFR 24    Expected Outcome Alivea follows with outside nephrology; not able to view notes/labs. She continues regular follow-up with the heart failure clinic. Kambell will continue to benefit from particiation in intensive cardiac rehab for nutrition, exercise, and lifestyle modification.             Psychosocial: Target Goals: Acknowledge presence or absence of significant depression and/or stress, maximize coping skills, provide positive support system. Participant is able to verbalize types and ability  to use techniques and skills needed for reducing stress and depression.  Initial Review & Psychosocial Screening:  Initial Psych Review & Screening - 09/26/22 1601       Initial Review   Current issues with Current Stress Concerns;Current Depression;Current Anxiety/Panic    Source of Stress Concerns Family;Financial;Unable to participate in former interests or hobbies    Comments Orphia has an estranged son that she is no longer speaking to which she admits has caused some grief. She also worries about her sisters health, and her finances as she is living on social security. Reassurance and support was offered, Adileny is interested in speaking with a counselor about her anxiety and depression.      Family Dynamics   Good Support System? Yes   Jernie has good support from her daughter     Barriers   Psychosocial barriers to participate in program The patient should benefit from training in stress management and relaxation.      Screening Interventions   Interventions Encouraged to exercise;To provide support and resources with identified psychosocial needs;Provide feedback about the scores to participant    Expected Outcomes Short Term goal: Utilizing psychosocial counselor, staff and physician to assist with identification of specific Stressors or current issues interfering with healing process. Setting desired goal for each stressor or current issue identified.;Long Term Goal: Stressors or current issues are controlled or eliminated.;Short Term goal: Identification and review with participant of any Quality of Life or Depression concerns found by scoring the questionnaire.;Long Term goal: The participant improves quality of Life and PHQ9 Scores as seen by post scores and/or verbalization of changes             Quality of Life Scores:  Quality of Life - 09/26/22 1605       Quality of Life   Select Quality of Life      Quality of Life Scores   Health/Function Pre 21.5 %    Socioeconomic  Pre 24 %    Psych/Spiritual Pre 22.93 %    Family Pre 24 %    GLOBAL Pre 22.63 %            Scores of 19 and below usually indicate a poorer quality of life in these areas.  A difference of  2-3 points is a clinically meaningful difference.  A difference of 2-3 points in the total score of the Quality of Life Index has been associated with significant improvement in overall quality of life, self-image, physical symptoms, and general health in studies assessing change in quality of life.  PHQ-9: Review Flowsheet       09/26/2022  Depression screen PHQ 2/9  Decreased Interest 0  Down, Depressed, Hopeless 1  PHQ - 2 Score 1  Altered sleeping 0  Tired, decreased energy 2  Change in appetite 3  Feeling bad or failure about yourself  2  Trouble concentrating 0  Moving slowly or fidgety/restless 1  Suicidal thoughts 0  PHQ-9 Score 9  Difficult doing work/chores Not difficult at all   Interpretation of Total Score  Total Score Depression Severity:  1-4 = Minimal depression, 5-9 = Mild depression, 10-14 = Moderate depression, 15-19 = Moderately severe depression, 20-27 = Severe depression   Psychosocial Evaluation and Intervention:   Psychosocial Re-Evaluation:  Psychosocial Re-Evaluation     [place] Name 10/02/22 1544 10/15/22 1114           Psychosocial Re-Evaluation   Current issues with Current Abuse or Neglect to Report;Current Anxiety/Panic;History of Depression Current Abuse or Neglect to Report;Current Anxiety/Panic;History of Depression      Comments Jamette denies being depressed on her first day of exercise but admits to having a history of depression Atticus has not voiced any increased concerns or stressors at intensive cardiac rehab      Expected [place] will have decreased or controlled depression and anxiety upon completion of her first day of exercise Bralee will have decreased or controlled depression and anxiety upon completion of her first day of exercise       Interventions Stress management education;Relaxation education;Encouraged to attend Cardiac Rehabilitation for the exercise Stress management education;Relaxation education;Encouraged to attend Cardiac Rehabilitation for the exercise      Continue Psychosocial Services  No Follow up required No Follow up required               Psychosocial Discharge (Final Psychosocial Re-Evaluation):  Psychosocial Re-Evaluation - 10/15/22 1114       Psychosocial Re-Evaluation   Current issues with Current Abuse or Neglect to Report;Current Anxiety/Panic;History of Depression    Comments Carlasia has not voiced any increased concerns or stressors at intensive cardiac rehab    Expected Outcomes Arraya will have decreased or controlled depression and anxiety upon completion of her first day of exercise    Interventions Stress management education;Relaxation education;Encouraged to attend Cardiac Rehabilitation for the exercise    Continue Psychosocial Services  No Follow up required             Vocational Rehabilitation: Provide vocational rehab assistance to qualifying candidates.   Vocational Rehab Evaluation & Intervention:  Vocational Rehab - 09/26/22 1609       Initial Vocational Rehab Evaluation & Intervention   Assessment shows need for Vocational Rehabilitation No   Julius is retired and does not require vocational rehab            Education: Education Goals: Education classes will be provided on a weekly basis, covering required topics. Participant will state understanding/return demonstration of topics presented.    Education     Row Name 10/02/22 1600     Education   Cardiac Education Topics Pritikin   Financial trader   Weekly Topic Comforting Weekend Breakfasts   Instruction Review Code 1- Verbalizes Understanding   Class Start Time 1400   Class Stop Time 1445   Class Time Calculation (min) 45 min    Lane Name 10/07/22 1600      Education   Cardiac Education Topics Pritikin   Select Core Videos     Core Videos   Educator Exercise Physiologist   Select Exercise Education   Exercise Education Biomechanial Limitations   Instruction Review Code 1- Verbalizes Understanding   Class Start Time 1406   Class Stop Time 1442   Class Time Calculation (min) 36 min    Row Name  10/09/22 1600     Education   Cardiac Education Topics Pritikin   Financial trader   Weekly Topic Fast Evening Meals   Instruction Review Code 1- Verbalizes Understanding   Class Start Time 1400   Class Stop Time 1445   Class Time Calculation (min) 45 min            Core Videos: Exercise    Move It!  Clinical staff conducted group or individual video education with verbal and written material and guidebook.  Patient learns the recommended Pritikin exercise program. Exercise with the goal of living a long, healthy life. Some of the health benefits of exercise include controlled diabetes, healthier blood pressure levels, improved cholesterol levels, improved heart and lung capacity, improved sleep, and better body composition. Everyone should speak with their doctor before starting or changing an exercise routine.  Biomechanical Limitations Clinical staff conducted group or individual video education with verbal and written material and guidebook.  Patient learns how biomechanical limitations can impact exercise and how we can mitigate and possibly overcome limitations to have an impactful and balanced exercise routine.  Body Composition Clinical staff conducted group or individual video education with verbal and written material and guidebook.  Patient learns that body composition (ratio of muscle mass to fat mass) is a key component to assessing overall fitness, rather than body weight alone. Increased fat mass, especially visceral belly fat, can put [place] at increased risk for metabolic  syndrome, type 2 diabetes, heart disease, and even death. It is recommended to combine diet and exercise (cardiovascular and resistance training) to improve your body composition. Seek guidance from your physician and exercise physiologist before implementing an exercise routine.  Exercise Action Plan Clinical staff conducted group or individual video education with verbal and written material and guidebook.  Patient learns the recommended strategies to achieve and enjoy long-term exercise adherence, including variety, self-motivation, self-efficacy, and positive decision making. Benefits of exercise include fitness, good health, weight management, more energy, better sleep, less stress, and overall well-being.  Medical   Heart Disease Risk Reduction Clinical staff conducted group or individual video education with verbal and written material and guidebook.  Patient learns our heart is our most vital organ as it circulates oxygen, nutrients, white blood cells, and hormones throughout the entire body, and carries waste away. Data supports a plant-based eating plan like the Pritikin Program for its effectiveness in slowing progression of and reversing heart disease. The video provides a number of recommendations to address heart disease.   Metabolic Syndrome and Belly Fat  Clinical staff conducted group or individual video education with verbal and written material and guidebook.  Patient learns what metabolic syndrome is, how it leads to heart disease, and how one can reverse it and keep it from coming back. You have metabolic syndrome if you have 3 of the following 5 criteria: abdominal obesity, high blood pressure, high triglycerides, low HDL cholesterol, and high blood sugar.  Hypertension and Heart Disease Clinical staff conducted group or individual video education with verbal and written material and guidebook.  Patient learns that high blood pressure, or hypertension, is very common in the  [place]. Hypertension is largely due to excessive salt intake, but other important risk factors include being overweight, physical inactivity, drinking too much alcohol, smoking, and not eating enough potassium from fruits and vegetables. High blood pressure is a leading risk factor for heart attack, stroke, congestive heart failure,  dementia, kidney failure, and premature death. Long-term effects of excessive salt intake include stiffening of the arteries and thickening of heart muscle and organ damage. Recommendations include ways to reduce hypertension and the risk of heart disease.  Diseases of Our Time - Focusing on Diabetes Clinical staff conducted group or individual video education with verbal and written material and guidebook.  Patient learns why the best way to stop diseases of our time is prevention, through food and other lifestyle changes. Medicine (such as prescription pills and surgeries) is often only a Band-Aid on the problem, not a long-term solution. Most common diseases of our time include obesity, type 2 diabetes, hypertension, heart disease, and cancer. The Pritikin Program is recommended and has been proven to help reduce, reverse, and/or prevent the damaging effects of metabolic syndrome.  Nutrition   Overview of the Pritikin Eating Plan  Clinical staff conducted group or individual video education with verbal and written material and guidebook.  Patient learns about the [place] for disease risk reduction. The [place] emphasizes a wide variety of unrefined, minimally-processed carbohydrates, like fruits, vegetables, whole grains, and legumes. Go, Caution, and Stop food choices are explained. Plant-based and lean animal proteins are emphasized. Rationale provided for low sodium intake for blood pressure control, low added sugars for blood sugar stabilization, and low added fats and oils for coronary artery disease risk reduction and weight  management.  Calorie Density  Clinical staff conducted group or individual video education with verbal and written material and guidebook.  Patient learns about calorie density and how it impacts the Pritikin Eating Plan. Knowing the characteristics of the food you choose will help you decide whether those foods will lead to weight gain or weight loss, and whether you want to consume more or less of them. Weight loss is usually a side effect of the Pritikin Eating Plan because of its focus on low calorie-dense foods.  Label Reading  Clinical staff conducted group or individual video education with verbal and written material and guidebook.  Patient learns about the Pritikin recommended label reading guidelines and corresponding recommendations regarding calorie density, added sugars, sodium content, and whole grains.  Dining Out - Part 1  Clinical staff conducted group or individual video education with verbal and written material and guidebook.  Patient learns that restaurant meals can be sabotaging because they can be so high in calories, fat, sodium, and/or sugar. Patient learns recommended strategies on how to positively address this and avoid unhealthy pitfalls.  Facts on Fats  Clinical staff conducted group or individual video education with verbal and written material and guidebook.  Patient learns that lifestyle modifications can be just as effective, if not more so, as many medications for lowering your risk of heart disease. A Pritikin lifestyle can help to reduce your risk of inflammation and atherosclerosis (cholesterol build-up, or plaque, in the artery walls). Lifestyle interventions such as dietary choices and physical activity address the cause of atherosclerosis. A review of the types of fats and their impact on blood cholesterol levels, along with dietary recommendations to reduce fat intake is also included.  Nutrition Action Plan  Clinical staff conducted group or individual  video education with verbal and written material and guidebook.  Patient learns how to incorporate Pritikin recommendations into their lifestyle. Recommendations include planning and keeping personal health goals in mind as an important part of their success.  Healthy Mind-Set    Healthy Minds, Bodies, Hearts  Clinical staff conducted group or individual  video education with verbal and written material and guidebook.  Patient learns how to identify when they are stressed. Video will discuss the impact of that stress, as well as the many benefits of stress management. Patient will also be introduced to stress management techniques. The way we think, act, and feel has an impact on our hearts.  How Our Thoughts Can Heal Our Hearts  Clinical staff conducted group or individual video education with verbal and written material and guidebook.  Patient learns that negative thoughts can cause depression and anxiety. This can result in negative lifestyle behavior and serious health problems. Cognitive behavioral therapy is an effective method to help control our thoughts in order to change and improve our emotional outlook.  Additional Videos:  Exercise    Improving Performance  Clinical staff conducted group or individual video education with verbal and written material and guidebook.  Patient learns to use a non-linear approach by alternating intensity levels and lengths of time spent exercising to help burn more calories and lose more body fat. Cardiovascular exercise helps improve heart health, metabolism, hormonal balance, blood sugar control, and recovery from fatigue. Resistance training improves strength, endurance, balance, coordination, reaction time, metabolism, and muscle mass. Flexibility exercise improves circulation, posture, and balance. Seek guidance from your physician and exercise physiologist before implementing an exercise routine and learn your capabilities and proper form for all  exercise.  Introduction to Yoga  Clinical staff conducted group or individual video education with verbal and written material and guidebook.  Patient learns about yoga, a discipline of the coming together of mind, breath, and body. The benefits of yoga include improved flexibility, improved range of motion, better posture and core strength, increased lung function, weight loss, and positive self-image. Yoga's heart health benefits include lowered blood pressure, healthier heart rate, decreased cholesterol and triglyceride levels, improved immune function, and reduced stress. Seek guidance from your physician and exercise physiologist before implementing an exercise routine and learn your capabilities and proper form for all exercise.  Medical   Aging: Enhancing Your Quality of Life  Clinical staff conducted group or individual video education with verbal and written material and guidebook.  Patient learns key strategies and recommendations to stay in good physical health and enhance quality of life, such as prevention strategies, having an advocate, securing a [place], and keeping a list of medications and system for tracking them. It also discusses how to avoid risk for bone loss.  Biology of Weight Control  Clinical staff conducted group or individual video education with verbal and written material and guidebook.  Patient learns that weight gain occurs because we consume more calories than we burn (eating more, moving less). Even if your body weight is normal, you may have higher ratios of fat compared to muscle mass. Too much body fat puts you at increased risk for cardiovascular disease, heart attack, stroke, type 2 diabetes, and obesity-related cancers. In addition to exercise, following the [place] can help reduce your risk.  Decoding Lab Results  Clinical staff conducted group or individual video education with verbal and written material and  guidebook.  Patient learns that lab test reflects one measurement whose values change over time and are influenced by many factors, including medication, stress, sleep, exercise, food, hydration, pre-existing medical conditions, and more. It is recommended to use the knowledge from this video to become more involved with your lab results and evaluate your numbers to speak with your doctor.  Diseases of Our Time - Overview  Clinical staff conducted group or individual video education with verbal and written material and guidebook.  Patient learns that according to the CDC, 50% to 70% of chronic diseases (such as obesity, type 2 diabetes, elevated lipids, hypertension, and heart disease) are avoidable through lifestyle improvements including healthier food choices, listening to satiety cues, and increased physical activity.  Sleep Disorders Clinical staff conducted group or individual video education with verbal and written material and guidebook.  Patient learns how good quality and duration of sleep are important to overall health and well-being. Patient also learns about sleep disorders and how they impact health along with recommendations to address them, including discussing with a physician.  Nutrition  Dining Out - Part 2 Clinical staff conducted group or individual video education with verbal and written material and guidebook.  Patient learns how to plan ahead and communicate in order to maximize their dining experience in a healthy and nutritious manner. Included are recommended food choices based on the type of restaurant the patient is visiting.   Fueling a Best boy conducted group or individual video education with verbal and written material and guidebook.  There is a strong connection between our food choices and our health. Diseases like obesity and type 2 diabetes are very prevalent and are in large-part due to lifestyle choices. The Pritikin Eating Plan  provides plenty of food and hunger-curbing satisfaction. It is easy to follow, affordable, and helps reduce health risks.  Menu Workshop  Clinical staff conducted group or individual video education with verbal and written material and guidebook.  Patient learns that restaurant meals can sabotage health goals because they are often packed with calories, fat, sodium, and sugar. Recommendations include strategies to plan ahead and to communicate with the manager, chef, or server to help order a healthier meal.  Planning Your Eating Strategy  Clinical staff conducted group or individual video education with verbal and written material and guidebook.  Patient learns about the [place] and its benefit of reducing the risk of disease. The [place] does not focus on calories. Instead, it emphasizes high-quality, nutrient-rich foods. By knowing the characteristics of the foods, we choose, we can determine their calorie density and make informed decisions.  Targeting Your Nutrition Priorities  Clinical staff conducted group or individual video education with verbal and written material and guidebook.  Patient learns that lifestyle habits have a tremendous impact on disease risk and progression. This video provides eating and physical activity recommendations based on your personal health goals, such as reducing LDL cholesterol, losing weight, preventing or controlling type 2 diabetes, and reducing high blood pressure.  Vitamins and Minerals  Clinical staff conducted group or individual video education with verbal and written material and guidebook.  Patient learns different ways to obtain key vitamins and minerals, including through a recommended healthy diet. It is important to discuss all supplements you take with your doctor.   Healthy Mind-Set    Smoking Cessation  Clinical staff conducted group or individual video education with verbal and written material and guidebook.   Patient learns that cigarette smoking and tobacco addiction pose a serious health risk which affects millions of people. Stopping smoking will significantly reduce the risk of heart disease, lung disease, and many forms of cancer. Recommended strategies for quitting are covered, including working with your doctor to develop a successful plan.  Culinary   Becoming a Financial trader conducted group or  individual video education with verbal and written material and guidebook.  Patient learns that cooking at home can be healthy, cost-effective, quick, and puts them in control. Keys to cooking healthy recipes will include looking at your recipe, assessing your equipment needs, planning ahead, making it simple, choosing cost-effective seasonal ingredients, and limiting the use of added fats, salts, and sugars.  Cooking - Breakfast and Snacks  Clinical staff conducted group or individual video education with verbal and written material and guidebook.  Patient learns how important breakfast is to satiety and nutrition through the entire day. Recommendations include key foods to eat during breakfast to help stabilize blood sugar levels and to prevent overeating at meals later in the day. Planning ahead is also a key component.  Cooking - Human resources officer conducted group or individual video education with verbal and written material and guidebook.  Patient learns eating strategies to improve overall health, including an approach to cook more at home. Recommendations include thinking of animal protein as a side on your plate rather than center stage and focusing instead on lower calorie dense options like vegetables, fruits, whole grains, and plant-based proteins, such as beans. Making sauces in large quantities to freeze for later and leaving the skin on your vegetables are also recommended to maximize your experience.  Cooking - Healthy Salads and Dressing Clinical staff  conducted group or individual video education with verbal and written material and guidebook.  Patient learns that vegetables, fruits, whole grains, and legumes are the foundations of the [place]. Recommendations include how to incorporate each of these in flavorful and healthy salads, and how to create homemade salad dressings. Proper handling of ingredients is also covered. Cooking - Soups and [employer] - Soups and Desserts Clinical staff conducted group or individual video education with verbal and written material and guidebook.  Patient learns that Pritikin soups and desserts make for easy, nutritious, and delicious snacks and meal components that are low in sodium, fat, sugar, and calorie density, while high in vitamins, minerals, and filling fiber. Recommendations include simple and healthy ideas for soups and desserts.   Overview     The Pritikin Solution Program Overview Clinical staff conducted group or individual video education with verbal and written material and guidebook.  Patient learns that the results of the [place] Program have been documented in more than 100 articles published in peer-reviewed journals, and the benefits include reducing risk factors for (and, in some cases, even reversing) high cholesterol, high blood pressure, type 2 diabetes, obesity, and more! An overview of the three key pillars of the Pritikin Program will be covered: eating well, doing regular exercise, and having a healthy mind-set.  WORKSHOPS  Exercise: Exercise Basics: Building Your Action Plan Clinical staff led group instruction and group discussion with PowerPoint presentation and patient guidebook. To enhance the learning environment the use of posters, models and videos may be added. At the conclusion of this workshop, patients will comprehend the difference between physical activity and exercise, as well as the benefits of incorporating both, into their routine. Patients will  understand the FITT (Frequency, Intensity, Time, and Type) principle and how to use it to build an exercise action plan. In addition, safety concerns and other considerations for exercise and cardiac rehab will be addressed by the presenter. The purpose of this lesson is to promote a comprehensive and effective weekly exercise routine in order to improve patients' overall level of fitness.   Managing Heart Disease:  Your Path to a Healthier Heart Clinical staff led group instruction and group discussion with PowerPoint presentation and patient guidebook. To enhance the learning environment the use of posters, models and videos may be added.At the conclusion of this workshop, patients will understand the anatomy and physiology of the heart. Additionally, they will understand how Pritikin's three pillars impact the risk factors, the progression, and the management of heart disease.  The purpose of this lesson is to provide a high-level overview of the heart, heart disease, and how the Pritikin lifestyle positively impacts risk factors.  Exercise Biomechanics Clinical staff led group instruction and group discussion with PowerPoint presentation and patient guidebook. To enhance the learning environment the use of posters, models and videos may be added. Patients will learn how the structural parts of their bodies function and how these functions impact their daily activities, movement, and exercise. Patients will learn how to promote a neutral spine, learn how to manage pain, and identify ways to improve their physical movement in order to promote healthy living. The purpose of this lesson is to expose patients to common physical limitations that impact physical activity. Participants will learn practical ways to adapt and manage aches and pains, and to minimize their effect on regular exercise. Patients will learn how to maintain good posture while sitting, walking, and lifting.  Balance Training  and Fall Prevention  Clinical staff led group instruction and group discussion with PowerPoint presentation and patient guidebook. To enhance the learning environment the use of posters, models and videos may be added. At the conclusion of this workshop, patients will understand the importance of their sensorimotor skills (vision, proprioception, and the vestibular system) in maintaining their ability to balance as they age. Patients will apply a variety of balancing exercises that are appropriate for their current level of function. Patients will understand the common causes for poor balance, possible solutions to these problems, and ways to modify their physical environment in order to minimize their fall risk. The purpose of this lesson is to teach patients about the importance of maintaining balance as they age and ways to minimize their risk of falling.  WORKSHOPS   Nutrition:  Fueling a Scientist, research (physical sciences) led group instruction and group discussion with PowerPoint presentation and patient guidebook. To enhance the learning environment the use of posters, models and videos may be added. Patients will review the foundational principles of the [place] and understand what constitutes a serving size in each of the food groups. Patients will also learn Pritikin-friendly foods that are better choices when away from home and review make-ahead meal and snack options. Calorie density will be reviewed and applied to three nutrition priorities: weight maintenance, weight loss, and weight gain. The purpose of this lesson is to reinforce (in a group setting) the key concepts around what patients are recommended to eat and how to apply these guidelines when away from home by planning and selecting Pritikin-friendly options. Patients will understand how calorie density may be adjusted for different weight management goals.  Mindful Eating  Clinical staff led group instruction and group  discussion with PowerPoint presentation and patient guidebook. To enhance the learning environment the use of posters, models and videos may be added. Patients will briefly review the concepts of the [place] and the importance of low-calorie dense foods. The concept of mindful eating will be introduced as well as the importance of paying attention to internal hunger signals. Triggers for non-hunger eating and techniques for dealing  with triggers will be explored. The purpose of this lesson is to provide patients with the opportunity to review the basic principles of the [place], discuss the value of eating mindfully and how to measure internal cues of hunger and fullness using the Hunger Scale. Patients will also discuss reasons for non-hunger eating and learn strategies to use for controlling emotional eating.  Targeting Your Nutrition Priorities Clinical staff led group instruction and group discussion with PowerPoint presentation and patient guidebook. To enhance the learning environment the use of posters, models and videos may be added. Patients will learn how to determine their genetic susceptibility to disease by reviewing their family history. Patients will gain insight into the importance of diet as part of an overall healthy lifestyle in mitigating the impact of genetics and other environmental insults. The purpose of this lesson is to provide patients with the opportunity to assess their personal nutrition priorities by looking at their family history, their own health history and current risk factors. Patients will also be able to discuss ways of prioritizing and modifying the [place] for their highest risk areas  Menu  Clinical staff led group instruction and group discussion with PowerPoint presentation and patient guidebook. To enhance the learning environment the use of posters, models and videos may be added. Using menus brought in from [employer],  or printed from [employer], patients will apply the [place] dining out guidelines that were presented in the [employer] video. Patients will also be able to practice these guidelines in a variety of provided scenarios. The purpose of this lesson is to provide patients with the opportunity to practice hands-on learning of the [place] with actual menus and practice scenarios.  Label Reading Clinical staff led group instruction and group discussion with PowerPoint presentation and patient guidebook. To enhance the learning environment the use of posters, models and videos may be added. Patients will review and discuss the Pritikin label reading guidelines presented in Pritikin's Label Reading Educational series video. Using fool labels brought in from local grocery stores and markets, patients will apply the label reading guidelines and determine if the packaged food meet the Pritikin guidelines. The purpose of this lesson is to provide patients with the opportunity to review, discuss, and practice hands-on learning of the Pritikin Label Reading guidelines with actual packaged food labels. [place] Workshops are designed to teach patients ways to prepare quick, simple, and affordable recipes at home. The importance of nutrition's role in chronic disease risk reduction is reflected in its emphasis in the overall Pritikin program. By learning how to prepare essential core Pritikin Eating Plan recipes, patients will increase control over what they eat; be able to customize the flavor of foods without the use of added salt, sugar, or fat; and improve the quality of the food they consume. By learning a set of core recipes which are easily assembled, quickly prepared, and affordable, patients are more likely to prepare more healthy foods at home. These workshops focus on convenient breakfasts, simple entres, side dishes, and desserts which  can be prepared with minimal effort and are consistent with nutrition recommendations for cardiovascular risk reduction. Cooking [employer] are taught by a Engineer, materials (RD) who has been trained by the [employer]. The chef or RD has a clear understanding of the importance of minimizing - if not completely eliminating - added fat, sugar, and sodium in recipes. Throughout the  series of Cooking School Workshop sessions, patients will learn about healthy ingredients and efficient methods of cooking to build confidence in their capability to prepare    Cooking School weekly topics:  Adding Flavor- Sodium-Free  Fast and Healthy Breakfasts  Powerhouse Plant-Based Proteins  Satisfying Salads and Dressings  Simple Sides and Sauces  International Cuisine-Spotlight on the [place] Zones  Delicious Desserts  Savory Soups  [employer] - Meals in a Snap  Tasty Appetizers and Snacks  Comforting Weekend Breakfasts  One-Pot Wonders   Fast Evening Meals  Easy [place] (Psychosocial): New Thoughts, New Behaviors Clinical staff led group instruction and group discussion with PowerPoint presentation and patient guidebook. To enhance the learning environment the use of posters, models and videos may be added. Patients will learn and practice techniques for developing effective health and lifestyle goals. Patients will be able to effectively apply the goal setting process learned to develop at least one new personal goal.  The purpose of this lesson is to expose patients to a new skill set of behavior modification techniques such as techniques setting SMART goals, overcoming barriers, and achieving new thoughts and new behaviors.  Managing Moods and Relationships Clinical staff led group instruction and group discussion with PowerPoint presentation and patient guidebook. To enhance the learning  environment the use of posters, models and videos may be added. Patients will learn how emotional and chronic stress factors can impact their health and relationships. They will learn healthy ways to manage their moods and utilize positive coping mechanisms. In addition, ICR patients will learn ways to improve communication skills. The purpose of this lesson is to expose patients to ways of understanding how one's mood and health are intimately connected. Developing a healthy outlook can help build positive relationships and connections with others. Patients will understand the importance of utilizing effective communication skills that include actively listening and being heard. They will learn and understand the importance of the "4 Cs" and especially Connections in fostering of a Healthy Mind-Set.  Healthy Sleep for a Healthy Heart Clinical staff led group instruction and group discussion with PowerPoint presentation and patient guidebook. To enhance the learning environment the use of posters, models and videos may be added. At the conclusion of this workshop, patients will be able to demonstrate knowledge of the importance of sleep to overall health, well-being, and quality of life. They will understand the symptoms of, and treatments for, common sleep disorders. Patients will also be able to identify daytime and nighttime behaviors which impact sleep, and they will be able to apply these tools to help manage sleep-related challenges. The purpose of this lesson is to provide patients with a general overview of sleep and outline the importance of quality sleep. Patients will learn about a few of the most common sleep disorders. Patients will also be introduced to the concept of "sleep hygiene," and discover ways to self-manage certain sleeping problems through simple daily behavior changes. Finally, the workshop will motivate patients by clarifying the links between quality sleep and their goals of  heart-healthy living.   Recognizing and Reducing Stress Clinical staff led group instruction and group discussion with PowerPoint presentation and patient guidebook. To enhance the learning environment the use of posters, models and videos may be added. At the conclusion of this workshop, patients will be able to understand the types of stress reactions, differentiate between acute and chronic stress, and recognize the impact that chronic stress has  on their health. They will also be able to apply different coping mechanisms, such as reframing negative self-talk. Patients will have the opportunity to practice a variety of stress management techniques, such as deep abdominal breathing, progressive muscle relaxation, and/or guided imagery.  The purpose of this lesson is to educate patients on the role of stress in their lives and to provide healthy techniques for coping with it.  Learning Barriers/Preferences:  Learning Barriers/Preferences - 09/26/22 1606       Learning Barriers/Preferences   Learning Barriers Sight;Hearing   pt wears glasses and hearing aids   Learning Preferences Audio;Computer/Internet;Group Instruction;Individual Instruction;Pictoral;Skilled Demonstration;Verbal Instruction;Video;Written Material             Education Topics:  Knowledge Questionnaire Score:  Knowledge Questionnaire Score - 09/26/22 1606       Knowledge Questionnaire Score   Pre Score 16/24             Core Components/Risk Factors/Patient Goals at Admission:  Personal Goals and Risk Factors at Admission - 09/26/22 1609       Core Components/Risk Factors/Patient Goals on Admission    Weight Management Yes;Obesity;Weight Loss    Intervention Weight Management: Provide education and appropriate resources to help participant work on and attain dietary goals.;Weight Management: Develop a combined nutrition and exercise program designed to reach desired caloric intake, while maintaining  appropriate intake of nutrient and fiber, sodium and fats, and appropriate energy expenditure required for the weight goal.;Weight Management/Obesity: Establish reasonable short term and long term weight goals.;Obesity: Provide education and appropriate resources to help participant work on and attain dietary goals.    Admit Weight 248 lb 7.3 oz (112.7 kg)    Goal Weight: Long Term 200 lb (90.7 kg)    Expected Outcomes Short Term: Continue to assess and modify interventions until short term weight is achieved;Long Term: Adherence to nutrition and physical activity/exercise program aimed toward attainment of established weight goal;Weight Loss: Understanding of general recommendations for a balanced deficit meal plan, which promotes 1-2 lb weight loss per week and includes a negative energy balance of [phone] kcal/d;Understanding recommendations for meals to include 15-35% energy as protein, 25-35% energy from fat, 35-60% energy from carbohydrates, less than '200mg'$  of dietary cholesterol, 20-35 gm of total fiber daily;Understanding of distribution of calorie intake throughout the day with the consumption of 4-5 meals/snacks    Heart Failure Yes    Intervention Provide a combined exercise and nutrition program that is supplemented with education, support and counseling about heart failure. Directed toward relieving symptoms such as shortness of breath, decreased exercise tolerance, and extremity edema.    Expected Outcomes Improve functional capacity of life;Short term: Attendance in program 2-3 days a week with increased exercise capacity. Reported lower sodium intake. Reported increased fruit and vegetable intake. Reports medication compliance.;Short term: Daily weights obtained and reported for increase. Utilizing diuretic protocols set by physician.;Long term: Adoption of self-care skills and reduction of barriers for early signs and symptoms recognition and intervention leading to self-care maintenance.     Hypertension Yes    Intervention Provide education on lifestyle modifcations including regular physical activity/exercise, weight management, moderate sodium restriction and increased consumption of fresh fruit, vegetables, and low fat dairy, alcohol moderation, and smoking cessation.;Monitor prescription use compliance.    Expected Outcomes Short Term: Continued assessment and intervention until BP is < 140/96m HG in hypertensive participants. < 130/825mHG in hypertensive participants with diabetes, heart failure or chronic kidney disease.;Long Term: Maintenance of blood pressure at goal levels.  Lipids Yes    Intervention Provide education and support for participant on nutrition & aerobic/resistive exercise along with prescribed medications to achieve LDL '70mg'$ , HDL >'40mg'$ .    Expected Outcomes Short Term: Participant states understanding of desired cholesterol values and is compliant with medications prescribed. Participant is following exercise prescription and nutrition guidelines.;Long Term: Cholesterol controlled with medications as prescribed, with individualized exercise RX and with personalized nutrition plan. Value goals: LDL < '70mg'$ , HDL > 40 mg.    Stress Yes    Intervention Offer individual and/or small group education and counseling on adjustment to heart disease, stress management and health-related lifestyle change. Teach and support self-help strategies.;Refer participants experiencing significant psychosocial distress to appropriate mental health specialists for further evaluation and treatment. When possible, include family members and significant others in education/counseling sessions.    Expected Outcomes Short Term: Participant demonstrates changes in health-related behavior, relaxation and other stress management skills, ability to obtain effective social support, and compliance with psychotropic medications if prescribed.;Long Term: Emotional wellbeing is indicated by absence of  clinically significant psychosocial distress or social isolation.             Core Components/Risk Factors/Patient Goals Review:   Goals and Risk Factor Review     Row Name 10/02/22 1546 10/15/22 1115           Core Components/Risk Factors/Patient Goals Review   Personal Goals Review Weight Management/Obesity;Heart Failure;Stress;Hypertension;Lipids Weight Management/Obesity;Heart Failure;Stress;Hypertension;Lipids      Review Roshawna started intensive cardiac rehab on  10/02/22. Jaymarie did well with exercise. Vital signs stable. Annastacia is somewhat decondtioned. Ceci enjoyed her first day of exercise. Lylith started intensive cardiac rehab on  10/02/22. Shearon is off to a good start to exercise. Vital signs stable. Tarran has lost 4.6 kg since starting cardiac rehab      Expected Outcomes Shandria will continue to participate in intensive cardiac rehab for exercise, nutrition and lifestyle modifications Murlean will continue to participate in intensive cardiac rehab for exercise, nutrition and lifestyle modifications               Core Components/Risk Factors/Patient Goals at Discharge (Final Review):   Goals and Risk Factor Review - 10/15/22 1115       Core Components/Risk Factors/Patient Goals Review   Personal Goals Review Weight Management/Obesity;Heart Failure;Stress;Hypertension;Lipids    Review Abbagail started intensive cardiac rehab on  10/02/22. Amouri is off to a good start to exercise. Vital signs stable. 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 Introduction to pritikin education program/ intensive cardiac rehab. Initial orientation packet reviewed with patient. 30 Day ITP Review. Toriann started intensive  cardiac rehab on 10/02/22 and is off to a good start toexercise. Deloros fell at home vaccuming at home yesterday 10/14/22 and will need to be evaluated by her primary care provider before returning to exercise               Comments: See ITP comments.Harrell Gave RN BSN

## 2022-10-16 ENCOUNTER — Encounter (HOSPITAL_COMMUNITY): Payer: Medicare Other

## 2022-10-17 ENCOUNTER — Encounter (HOSPITAL_COMMUNITY): Payer: Medicare Other

## 2022-10-18 ENCOUNTER — Encounter (HOSPITAL_COMMUNITY): Payer: Medicare Other

## 2022-10-21 ENCOUNTER — Encounter (HOSPITAL_COMMUNITY): Payer: Medicare Other

## 2022-10-23 ENCOUNTER — Telehealth (HOSPITAL_COMMUNITY): Payer: Self-pay | Admitting: *Deleted

## 2022-10-23 ENCOUNTER — Encounter (HOSPITAL_COMMUNITY): Payer: Medicare Other

## 2022-10-23 NOTE — Telephone Encounter (Signed)
Spoke with Brittany Jackson she has cold symptoms and a cough. Brittany Jackson has a follow up appointment with Dr Gerarda Fraction her primary care provider on Friday. Will cancel appointments for [facility] for the rest of the week. Brittany Jackson hopes to return to exercise next week if she is feeling better.Harrell Gave RN BSN

## 2022-10-25 ENCOUNTER — Encounter (HOSPITAL_COMMUNITY): Payer: Medicare Other

## 2022-10-28 ENCOUNTER — Encounter (HOSPITAL_COMMUNITY): Payer: Medicare Other

## 2022-10-30 ENCOUNTER — Encounter (HOSPITAL_COMMUNITY): Payer: Medicare Other

## 2022-10-30 NOTE — Telephone Encounter (Signed)
Rerouting to Triage for 2nd attempt to contact and then sign encounter.

## 2022-11-01 ENCOUNTER — Encounter (HOSPITAL_COMMUNITY): Payer: Medicare Other

## 2022-11-04 ENCOUNTER — Encounter (HOSPITAL_COMMUNITY): Payer: Medicare Other

## 2022-11-04 NOTE — Addendum Note (Signed)
Encounter addended by: [place], Kamyah Wilhelmsen G, RD on: 11/04/2022 9:14 AM  Actions taken: Flowsheet data copied forward, Flowsheet accepted

## 2022-11-06 ENCOUNTER — Encounter (HOSPITAL_COMMUNITY): Payer: Medicare Other

## 2022-11-07 ENCOUNTER — Encounter (HOSPITAL_COMMUNITY): Payer: Self-pay | Admitting: *Deleted

## 2022-11-07 DIAGNOSIS — I213 ST elevation (STEMI) myocardial infarction of unspecified site: Secondary | ICD-10-CM

## 2022-11-07 DIAGNOSIS — I5032 Chronic diastolic (congestive) heart failure: Secondary | ICD-10-CM

## 2022-11-07 NOTE — Progress Notes (Signed)
Cardiac Individual Treatment Plan  Patient Details  Name: Brittany Jackson MRN: [id number] Date of Birth: 03/06/1941 Referring Provider:   Flowsheet Row INTENSIVE CARDIAC REHAB ORIENT from 09/26/2022 in [facility] for Heart, Vascular, & [place]  Referring Provider Loralie Champagne, MD       Initial Encounter Date:  [place] from 09/26/2022 in [facility] for Heart, Vascular, & [place]  Date 09/26/22       Visit Diagnosis: Heart failure, diastolic, chronic ([place])  ST elevation myocardial infarction (STEMI), unspecified artery ([place])  Patient's Home Medications on Admission:  Current Outpatient Medications:    acetaminophen (TYLENOL) 500 MG tablet, Take 500-1,000 mg by mouth every 6 (six) hours as needed (pain.)., Disp: , Rfl:    albuterol (VENTOLIN HFA) 108 (90 Base) MCG/ACT inhaler, Inhale 2 puffs into the lungs every 6 (six) hours as needed for wheezing or shortness of breath., Disp: 8 g, Rfl: 6   amiodarone (PACERONE) 200 MG tablet, Take 0.5 tablets (100 mg total) by mouth daily., Disp: 45 tablet, Rfl: 3   apixaban (ELIQUIS) 2.5 MG TABS tablet, Take 1 tablet (2.5 mg total) by mouth 2 (two) times daily., Disp: 60 tablet, Rfl: 6   atorvastatin (LIPITOR) 80 MG tablet, TAKE ONE TABLET BY MOUTH DAILY, Disp: 30 tablet, Rfl: 5   Coenzyme Q10 (COQ10 PO), Take 1 capsule by mouth in the morning., Disp: , Rfl:    colchicine 0.6 MG tablet, Take 0.6 mg by mouth 2 (two) times daily as needed (onset of gout)., Disp: , Rfl:    diazepam (VALIUM) 10 MG tablet, Take 10 mg by mouth 3 (three) times daily as needed for anxiety., Disp: , Rfl:    empagliflozin (JARDIANCE) 10 MG TABS tablet, Take 1 tablet (10 mg total) by mouth daily before breakfast., Disp: 14 tablet, Rfl: 11   hydrocortisone cream 1 %, Apply topically as needed for itching., Disp: 28 g, Rfl: 0   Multiple Vitamins-Minerals (ADULT ONE DAILY GUMMIES PO), Take  1 tablet by mouth in the morning., Disp: , Rfl:    Red Yeast Rice 600 MG CAPS, Take 600 mg by mouth daily., Disp: , Rfl:    torsemide (DEMADEX) 20 MG tablet, Take 1 tablet (20 mg total) by mouth every other day., Disp: 45 tablet, Rfl: 3   umeclidinium-vilanterol (ANORO ELLIPTA) 62.5-25 MCG/ACT AEPB, Inhale 1 puff into the lungs daily. (Patient not taking: Reported on 06/17/2022), Disp: 10 each, Rfl: 5  Past Medical History: Past Medical History:  Diagnosis Date   CHF (congestive heart failure) (HCC)    Hypertension    Urticaria     Tobacco Use: Social History   Tobacco Use  Smoking Status Former   Packs/day: 1.00   Years: 60.00   Additional pack years: 0.00   Total pack years: 60.00   Types: Cigarettes   Start date: 26   Quit date: 01/08/2022   Years since quitting: 0.8  Smokeless Tobacco Never    Labs: Review Flowsheet  More data exists      Latest Ref Rng & Units 01/19/2022 01/20/2022 01/21/2022 01/22/2022 03/19/2022  Labs for ITP Cardiac and Pulmonary Rehab  Cholestrol 0 - 200 mg/dL - - - - 151   LDL (calc) 0 - 99 mg/dL - - - - 71   HDL-C >40 mg/dL - - - - 56   Trlycerides <150 mg/dL - - - - 122   O2 Saturation % 75.1  78.5  87.6  58  47.9  -    Capillary Blood Glucose: Lab Results  Component Value Date   GLUCAP 105 (H) 01/22/2022   GLUCAP 80 01/22/2022   GLUCAP 83 01/22/2022   GLUCAP 93 01/21/2022   GLUCAP 86 01/21/2022     Exercise Target Goals: Exercise Program Goal: Individual exercise prescription set using results from initial 6 min walk test and THRR while considering  patient's activity barriers and safety.   Exercise Prescription Goal: Initial exercise prescription builds to 30-45 minutes a day of aerobic activity, 2-3 days per week.  Home exercise guidelines will be given to patient during program as part of exercise prescription that the participant will acknowledge.  Activity Barriers & Risk Stratification:  Activity Barriers & Cardiac Risk  Stratification - 09/26/22 1558       Activity Barriers & Cardiac Risk Stratification   Activity Barriers Deconditioning;Muscular Weakness;Shortness of Breath;Decreased Ventricular Function;History of Falls    Cardiac Risk Stratification High   <5 METs on 6MWT            6 Minute Walk:  6 Minute Walk     Row Name 09/26/22 1555         6 Minute Walk   Phase Initial     Distance 960 feet     Walk Time 6 minutes     # of Rest Breaks 1  2:27-3:50 SOB, SpO2 100%     MPH 1.8     METS 1.2     RPE 13     Perceived Dyspnea  1     VO2 Peak 4.22     Symptoms Yes (comment)     Comments SOB, resolved with rest.     Resting HR 69 bpm     Resting BP 120/54     Resting Oxygen Saturation  96 %     Exercise Oxygen Saturation  during 6 min walk 97 %     Max Ex. HR 110 bpm     Max Ex. BP 168/52     2 Minute Post BP 124/52              Oxygen Initial Assessment:   Oxygen Re-Evaluation:   Oxygen Discharge (Final Oxygen Re-Evaluation):   Initial Exercise Prescription:  Initial Exercise Prescription - 09/26/22 1500       Date of Initial Exercise RX and Referring Provider   Date 09/26/22    Referring Provider Loralie Champagne, MD    Expected Discharge Date 12/06/22      NuStep   Level 1    SPM 70    Minutes 25    METs 2      Prescription Details   Frequency (times per week) 3    Duration Progress to 30 minutes of continuous aerobic without signs/symptoms of physical distress      Intensity   THRR 40-80% of Max Heartrate 55-110    Ratings of Perceived Exertion 11-13    Perceived Dyspnea 0-4      Progression   Progression Continue to progress workloads to maintain intensity without signs/symptoms of physical distress.      Resistance Training   Training Prescription Yes    Weight 2    Reps 10-15             Perform Capillary Blood Glucose checks as needed.  Exercise Prescription Changes:   Exercise Prescription Changes     Row Name 10/02/22 1615  Response to Exercise   Blood Pressure (Admit) 120/70       Blood Pressure (Exercise) 144/68       Blood Pressure (Exit) 108/80       Heart Rate (Admit) 69 bpm       Heart Rate (Exercise) 109 bpm       Heart Rate (Exit) 75 bpm       Rating of Perceived Exertion (Exercise) 13       Symptoms none       Comments Pt first day in teh Pritikin ICR program       Duration Progress to 30 minutes of  aerobic without signs/symptoms of physical distress       Intensity THRR unchanged         Progression   Progression Continue to progress workloads to maintain intensity without signs/symptoms of physical distress.       Average METs 2.3         Resistance Training   Training Prescription No         NuStep   Level 2       SPM 95       Minutes 25       METs 2.3                Exercise Comments:   Exercise Comments     Row Name 10/02/22 1620 11/01/22 0851         Exercise Comments Pt first day in the CRP2 program, Pt tolerated exercise well with an average MET level of 2.3. Pt is learning her THRR, RPE and ExRx Pt has been absent. Will review goals and education upon her return               Exercise Goals and Review:   Exercise Goals     Row Name 09/26/22 1600             Exercise Goals   Increase Physical Activity Yes       Intervention Provide advice, education, support and counseling about physical activity/exercise needs.;Develop an individualized exercise prescription for aerobic and resistive training based on initial evaluation findings, risk stratification, comorbidities and participant's personal goals.       Expected Outcomes Short Term: Attend rehab on a regular basis to increase amount of physical activity.;Long Term: Exercising regularly at least 3-5 days a week.;Long Term: Add in home exercise to make exercise part of routine and to increase amount of physical activity.       Increase Strength and Stamina Yes       Intervention Provide  advice, education, support and counseling about physical activity/exercise needs.;Develop an individualized exercise prescription for aerobic and resistive training based on initial evaluation findings, risk stratification, comorbidities and participant's personal goals.       Expected Outcomes Short Term: Increase workloads from initial exercise prescription for resistance, speed, and METs.;Short Term: Perform resistance training exercises routinely during rehab and add in resistance training at home;Long Term: Improve cardiorespiratory fitness, muscular endurance and strength as measured by increased METs and functional capacity (6MWT)       Able to understand and use rate of perceived exertion (RPE) scale Yes       Intervention Provide education and explanation on how to use RPE scale       Expected Outcomes Short Term: Able to use RPE daily in rehab to express subjective intensity level;Long Term:  Able to use RPE to guide intensity level when exercising independently  Able to understand and use Dyspnea scale Yes       Intervention Provide education and explanation on how to use Dyspnea scale       Expected Outcomes Short Term: Able to use Dyspnea scale daily in rehab to express subjective sense of shortness of breath during exertion;Long Term: Able to use Dyspnea scale to guide intensity level when exercising independently       Knowledge and understanding of Target Heart Rate Range (THRR) Yes       Intervention Provide education and explanation of THRR including how the numbers were predicted and where they are located for reference       Expected Outcomes Short Term: Able to state/look up THRR;Long Term: Able to use THRR to govern intensity when exercising independently;Short Term: Able to use daily as guideline for intensity in rehab       Understanding of Exercise Prescription Yes       Intervention Provide education, explanation, and written materials on patient's individual exercise  prescription       Expected Outcomes Short Term: Able to explain program exercise prescription;Long Term: Able to explain home exercise prescription to exercise independently                Exercise Goals Re-Evaluation :  Exercise Goals Re-Evaluation     [place] Name 10/02/22 1618             Exercise Goal Re-Evaluation   Exercise Goals Review Increase Physical Activity;Understanding of Exercise Prescription;Increase Strength and Stamina;Knowledge and understanding of Target Heart Rate Range (THRR);Able to understand and use rate of perceived exertion (RPE) scale       Comments Pt first day in the CRP2 program, Pt tolerated exercise well with an average MET level of 2.3. Pt is learning her THRR, RPE and ExRx       Expected Outcomes Will continue to monitor pt and progress workloads as tolerated without sign or symptom.                Discharge Exercise Prescription (Final Exercise Prescription Changes):  Exercise Prescription Changes - 10/02/22 1615       Response to Exercise   Blood Pressure (Admit) 120/70    Blood Pressure (Exercise) 144/68    Blood Pressure (Exit) 108/80    Heart Rate (Admit) 69 bpm    Heart Rate (Exercise) 109 bpm    Heart Rate (Exit) 75 bpm    Rating of Perceived Exertion (Exercise) 13    Symptoms none    Comments Pt first day in teh Pritikin ICR program    Duration Progress to 30 minutes of  aerobic without signs/symptoms of physical distress    Intensity THRR unchanged      Progression   Progression Continue to progress workloads to maintain intensity without signs/symptoms of physical distress.    Average METs 2.3      Resistance Training   Training Prescription No      NuStep   Level 2    SPM 95    Minutes 25    METs 2.3             Nutrition:  Target Goals: Understanding of nutrition guidelines, daily intake of sodium 1500mg , cholesterol 200mg , calories 30% from fat and 7% or less from saturated fats, daily to have 5 or more  servings of fruits and vegetables.  Biometrics:  Pre Biometrics - 09/26/22 1428       Pre Biometrics   Waist Circumference 53 inches  Hip Circumference 57 inches    Waist to Hip Ratio 0.93 %    Triceps Skinfold 45 mm    % Body Fat 56.3 %    Grip Strength 28 kg    Flexibility 13.5 in    Single Leg Stand 3.25 seconds              Nutrition Therapy Plan and Nutrition Goals:  Nutrition Therapy & Goals - 11/04/22 0912       Nutrition Therapy   Diet Heart healthy diet    Drug/Food Interactions Statins/Certain Fruits      Personal Nutrition Goals   Nutrition Goal Patient to identify strategies for reducing cardiovascular risk by attending the weekly Pritikin education and nutrition series    Personal Goal #2 Patient to improve diet quality by using the plate method as a daily guide for meal planning to include lean protein/plant protein, fruits, vegetables, whole grains, nonfat dairy as part of well balanced diet    Personal Goal #3 Patient to reduce sodium intake to 1500mg  per day    Comments Patient has not attended cardiac rehab since 10/09/22. Goals have not been reassessed due to attendance. Patient will continue to benefit from participation in intensive cardiac rehab for nutrition, exercise, and lifestyle modfications as able.      Intervention Plan   Intervention Prescribe, educate and counsel regarding individualized specific dietary modifications aiming towards targeted core components such as weight, hypertension, lipid management, diabetes, heart failure and other comorbidities.;Nutrition handout(s) given to patient.    Expected Outcomes Short Term Goal: Understand basic principles of dietary content, such as calories, fat, sodium, cholesterol and nutrients.;Long Term Goal: Adherence to prescribed nutrition plan.             Nutrition Assessments:  MEDIFICTS Score Key: ?70 Need to make dietary changes  40-70 Heart Healthy Diet ? 40 Therapeutic Level  Cholesterol Diet    Picture Your Plate Scores: D34-534 Unhealthy dietary pattern with much room for improvement. 41-50 Dietary pattern unlikely to meet recommendations for good health and room for improvement. 51-60 More healthful dietary pattern, with some room for improvement.  >60 Healthy dietary pattern, although there may be some specific behaviors that could be improved.    Nutrition Goals Re-Evaluation:  Nutrition Goals Re-Evaluation     Row Name 10/03/22 0853 11/04/22 0912           Goals   Current Weight 245 lb 9.5 oz (111.4 kg) --      Comment lipids WNL, Cr 1.99, GFR 24 No new labs; most recent labs  lipids WNL, Cr 1.99, GFR 24      Expected Outcome Lisvet follows with outside nephrology; not able to view notes/labs. She continues regular follow-up with the heart failure clinic. Gabbrielle will continue to benefit from particiation in intensive cardiac rehab for nutrition, exercise, and lifestyle modification. Patient has not attended cardiac rehab since 10/09/22. Goals have not been reassessed due to attendance. Patient will continue to benefit from participation in intensive cardiac rehab for nutrition, exercise, and lifestyle modfications as able.               Nutrition Goals Re-Evaluation:  Nutrition Goals Re-Evaluation     [place] Name 10/03/22 0853 11/04/22 0912           Goals   Current Weight 245 lb 9.5 oz (111.4 kg) --      Comment lipids WNL, Cr 1.99, GFR 24 No new labs; most recent labs  lipids WNL,  Cr 1.99, GFR 24      Expected Outcome Tiphanie follows with outside nephrology; not able to view notes/labs. She continues regular follow-up with the heart failure clinic. Allena will continue to benefit from particiation in intensive cardiac rehab for nutrition, exercise, and lifestyle modification. Patient has not attended cardiac rehab since 10/09/22. Goals have not been reassessed due to attendance. Patient will continue to benefit from participation in intensive cardiac rehab  for nutrition, exercise, and lifestyle modfications as able.               Nutrition Goals Discharge (Final Nutrition Goals Re-Evaluation):  Nutrition Goals Re-Evaluation - 11/04/22 0912       Goals   Comment No new labs; most recent labs  lipids WNL, Cr 1.99, GFR 24    Expected Outcome Patient has not attended cardiac rehab since 10/09/22. Goals have not been reassessed due to attendance. Patient will continue to benefit from participation in intensive cardiac rehab for nutrition, exercise, and lifestyle modfications as able.             Psychosocial: Target Goals: Acknowledge presence or absence of significant depression and/or stress, maximize coping skills, provide positive support system. Participant is able to verbalize types and ability to use techniques and skills needed for reducing stress and depression.  Initial Review & Psychosocial Screening:  Initial Psych Review & Screening - 09/26/22 1601       Initial Review   Current issues with Current Stress Concerns;Current Depression;Current Anxiety/Panic    Source of Stress Concerns Family;Financial;Unable to participate in former interests or hobbies    Comments Emmilyn has an estranged son that she is no longer speaking to which she admits has caused some grief. She also worries about her sisters health, and her finances as she is living on social security. Reassurance and support was offered, Patrese is interested in speaking with a counselor about her anxiety and depression.      Family Dynamics   Good Support System? Yes   Articia has good support from her daughter     Barriers   Psychosocial barriers to participate in program The patient should benefit from training in stress management and relaxation.      Screening Interventions   Interventions Encouraged to exercise;To provide support and resources with identified psychosocial needs;Provide feedback about the scores to participant    Expected Outcomes Short Term goal:  Utilizing psychosocial counselor, staff and physician to assist with identification of specific Stressors or current issues interfering with healing process. Setting desired goal for each stressor or current issue identified.;Long Term Goal: Stressors or current issues are controlled or eliminated.;Short Term goal: Identification and review with participant of any Quality of Life or Depression concerns found by scoring the questionnaire.;Long Term goal: The participant improves quality of Life and PHQ9 Scores as seen by post scores and/or verbalization of changes             Quality of Life Scores:  Quality of Life - 09/26/22 1605       Quality of Life   Select Quality of Life      Quality of Life Scores   Health/Function Pre 21.5 %    Socioeconomic Pre 24 %    Psych/Spiritual Pre 22.93 %    Family Pre 24 %    GLOBAL Pre 22.63 %            Scores of 19 and below usually indicate a poorer quality of life in these areas.  A difference of  2-3 points is a clinically meaningful difference.  A difference of 2-3 points in the total score of the Quality of Life Index has been associated with significant improvement in overall quality of life, self-image, physical symptoms, and general health in studies assessing change in quality of life.  PHQ-9: Review Flowsheet       09/26/2022  Depression screen PHQ 2/9  Decreased Interest 0  Down, Depressed, Hopeless 1  PHQ - 2 Score 1  Altered sleeping 0  Tired, decreased energy 2  Change in appetite 3  Feeling bad or failure about yourself  2  Trouble concentrating 0  Moving slowly or fidgety/restless 1  Suicidal thoughts 0  PHQ-9 Score 9  Difficult doing work/chores Not difficult at all   Interpretation of Total Score  Total Score Depression Severity:  1-4 = Minimal depression, 5-9 = Mild depression, 10-14 = Moderate depression, 15-19 = Moderately severe depression, 20-27 = Severe depression   Psychosocial Evaluation and  Intervention:   Psychosocial Re-Evaluation:  Psychosocial Re-Evaluation     [place] Name 10/02/22 1544 10/15/22 1114 11/07/22 0840         Psychosocial Re-Evaluation   Current issues with Current Abuse or Neglect to Report;Current Anxiety/Panic;History of Depression Current Abuse or Neglect to Report;Current Anxiety/Panic;History of Depression Current Abuse or Neglect to Report;Current Anxiety/Panic;History of Depression     Comments Donnae denies being depressed on her first day of exercise but admits to having a history of depression Kinlynn has not voiced any increased concerns or stressors at intensive cardiac rehab Kora has not attended exercise since 10/09/22. Unable to reassess     Expected Outcomes Amoura will have decreased or controlled depression and anxiety upon completion of her first day of exercise Eshe will have decreased or controlled depression and anxiety upon completion of her first day of exercise [place] will have decreased or controlled depression and anxiety upon completion of her first day of exercise     Interventions Stress management education;Relaxation education;Encouraged to attend Cardiac Rehabilitation for the exercise Stress management education;Relaxation education;Encouraged to attend Cardiac Rehabilitation for the exercise Stress management education;Relaxation education;Encouraged to attend Cardiac Rehabilitation for the exercise     Continue Psychosocial Services  No Follow up required No Follow up required No Follow up required              Psychosocial Discharge (Final Psychosocial Re-Evaluation):  Psychosocial Re-Evaluation - 11/07/22 0840       Psychosocial Re-Evaluation   Current issues with Current Abuse or Neglect to Report;Current Anxiety/Panic;History of Depression    Comments Clennie has not attended exercise since 10/09/22. Unable to reassess    Expected Outcomes Jazelyn will have decreased or controlled depression and anxiety upon completion of her first  day of exercise    Interventions Stress management education;Relaxation education;Encouraged to attend Cardiac Rehabilitation for the exercise    Continue Psychosocial Services  No Follow up required             Vocational Rehabilitation: Provide vocational rehab assistance to qualifying candidates.   Vocational Rehab Evaluation & Intervention:  Vocational Rehab - 09/26/22 1609       Initial Vocational Rehab Evaluation & Intervention   Assessment shows need for Vocational Rehabilitation No   Marguret is retired and does not require vocational rehab            Education: Education Goals: Education classes will be provided on a weekly basis, covering required topics. Participant will state understanding/return demonstration  of topics presented.    Education     Row Name 10/02/22 1600     Education   Cardiac Education Topics Pritikin   Financial trader   Weekly Topic Comforting Weekend Breakfasts   Instruction Review Code 1- Verbalizes Understanding   Class Start Time 1400   Class Stop Time 1445   Class Time Calculation (min) 45 min    [place] Name 10/07/22 1600     Education   Cardiac Education Topics Pritikin   Select Core Videos     Core Videos   Educator Exercise Physiologist   Select Exercise Education   Exercise Education Biomechanial Limitations   Instruction Review Code 1- Verbalizes Understanding   Class Start Time 1406   Class Stop Time 1442   Class Time Calculation (min) 36 min    [place] Name 10/09/22 1600     Education   Cardiac Education Topics Pritikin   Financial trader   Weekly Topic Fast Evening Meals   Instruction Review Code 1- Verbalizes Understanding   Class Start Time 1400   Class Stop Time 1445   Class Time Calculation (min) 45 min            Core Videos: Exercise    Move It!  Clinical staff conducted group or individual video education  with verbal and written material and guidebook.  Patient learns the recommended Pritikin exercise program. Exercise with the goal of living a long, healthy life. Some of the health benefits of exercise include controlled diabetes, healthier blood pressure levels, improved cholesterol levels, improved heart and lung capacity, improved sleep, and better body composition. Everyone should speak with their doctor before starting or changing an exercise routine.  Biomechanical Limitations Clinical staff conducted group or individual video education with verbal and written material and guidebook.  Patient learns how biomechanical limitations can impact exercise and how we can mitigate and possibly overcome limitations to have an impactful and balanced exercise routine.  Body Composition Clinical staff conducted group or individual video education with verbal and written material and guidebook.  Patient learns that body composition (ratio of muscle mass to fat mass) is a key component to assessing overall fitness, rather than body weight alone. Increased fat mass, especially visceral belly fat, can put [place] at increased risk for metabolic syndrome, type 2 diabetes, heart disease, and even death. It is recommended to combine diet and exercise (cardiovascular and resistance training) to improve your body composition. Seek guidance from your physician and exercise physiologist before implementing an exercise routine.  Exercise Action Plan Clinical staff conducted group or individual video education with verbal and written material and guidebook.  Patient learns the recommended strategies to achieve and enjoy long-term exercise adherence, including variety, self-motivation, self-efficacy, and positive decision making. Benefits of exercise include fitness, good health, weight management, more energy, better sleep, less stress, and overall well-being.  Medical   Heart Disease Risk Reduction Clinical staff conducted  group or individual video education with verbal and written material and guidebook.  Patient learns our heart is our most vital organ as it circulates oxygen, nutrients, white blood cells, and hormones throughout the entire body, and carries waste away. Data supports a plant-based eating plan like the Pritikin Program for its effectiveness in slowing progression of and reversing heart disease. The video provides a number of recommendations to address heart disease.   Metabolic Syndrome  and Belly Fat  Clinical staff conducted group or individual video education with verbal and written material and guidebook.  Patient learns what metabolic syndrome is, how it leads to heart disease, and how one can reverse it and keep it from coming back. You have metabolic syndrome if you have 3 of the following 5 criteria: abdominal obesity, high blood pressure, high triglycerides, low HDL cholesterol, and high blood sugar.  Hypertension and Heart Disease Clinical staff conducted group or individual video education with verbal and written material and guidebook.  Patient learns that high blood pressure, or hypertension, is very common in the [place]. Hypertension is largely due to excessive salt intake, but other important risk factors include being overweight, physical inactivity, drinking too much alcohol, smoking, and not eating enough potassium from fruits and vegetables. High blood pressure is a leading risk factor for heart attack, stroke, congestive heart failure, dementia, kidney failure, and premature death. Long-term effects of excessive salt intake include stiffening of the arteries and thickening of heart muscle and organ damage. Recommendations include ways to reduce hypertension and the risk of heart disease.  Diseases of Our Time - Focusing on Diabetes Clinical staff conducted group or individual video education with verbal and written material and guidebook.  Patient learns why the best way to stop  diseases of our time is prevention, through food and other lifestyle changes. Medicine (such as prescription pills and surgeries) is often only a Band-Aid on the problem, not a long-term solution. Most common diseases of our time include obesity, type 2 diabetes, hypertension, heart disease, and cancer. The Pritikin Program is recommended and has been proven to help reduce, reverse, and/or prevent the damaging effects of metabolic syndrome.  Nutrition   Overview of the Pritikin Eating Plan  Clinical staff conducted group or individual video education with verbal and written material and guidebook.  Patient learns about the [place] for disease risk reduction. The [place] emphasizes a wide variety of unrefined, minimally-processed carbohydrates, like fruits, vegetables, whole grains, and legumes. Go, Caution, and Stop food choices are explained. Plant-based and lean animal proteins are emphasized. Rationale provided for low sodium intake for blood pressure control, low added sugars for blood sugar stabilization, and low added fats and oils for coronary artery disease risk reduction and weight management.  Calorie Density  Clinical staff conducted group or individual video education with verbal and written material and guidebook.  Patient learns about calorie density and how it impacts the Pritikin Eating Plan. Knowing the characteristics of the food you choose will help you decide whether those foods will lead to weight gain or weight loss, and whether you want to consume more or less of them. Weight loss is usually a side effect of the Pritikin Eating Plan because of its focus on low calorie-dense foods.  Label Reading  Clinical staff conducted group or individual video education with verbal and written material and guidebook.  Patient learns about the Pritikin recommended label reading guidelines and corresponding recommendations regarding calorie density, added sugars, sodium  content, and whole grains.  Dining Out - Part 1  Clinical staff conducted group or individual video education with verbal and written material and guidebook.  Patient learns that restaurant meals can be sabotaging because they can be so high in calories, fat, sodium, and/or sugar. Patient learns recommended strategies on how to positively address this and avoid unhealthy pitfalls.  Facts on Fats  Clinical staff conducted group or individual video education with verbal and  written material and guidebook.  Patient learns that lifestyle modifications can be just as effective, if not more so, as many medications for lowering your risk of heart disease. A Pritikin lifestyle can help to reduce your risk of inflammation and atherosclerosis (cholesterol build-up, or plaque, in the artery walls). Lifestyle interventions such as dietary choices and physical activity address the cause of atherosclerosis. A review of the types of fats and their impact on blood cholesterol levels, along with dietary recommendations to reduce fat intake is also included.  Nutrition Action Plan  Clinical staff conducted group or individual video education with verbal and written material and guidebook.  Patient learns how to incorporate Pritikin recommendations into their lifestyle. Recommendations include planning and keeping personal health goals in mind as an important part of their success.  Healthy Mind-Set    Healthy Minds, Bodies, Hearts  Clinical staff conducted group or individual video education with verbal and written material and guidebook.  Patient learns how to identify when they are stressed. Video will discuss the impact of that stress, as well as the many benefits of stress management. Patient will also be introduced to stress management techniques. The way we think, act, and feel has an impact on our hearts.  How Our Thoughts Can Heal Our Hearts  Clinical staff conducted group or individual video education  with verbal and written material and guidebook.  Patient learns that negative thoughts can cause depression and anxiety. This can result in negative lifestyle behavior and serious health problems. Cognitive behavioral therapy is an effective method to help control our thoughts in order to change and improve our emotional outlook.  Additional Videos:  Exercise    Improving Performance  Clinical staff conducted group or individual video education with verbal and written material and guidebook.  Patient learns to use a non-linear approach by alternating intensity levels and lengths of time spent exercising to help burn more calories and lose more body fat. Cardiovascular exercise helps improve heart health, metabolism, hormonal balance, blood sugar control, and recovery from fatigue. Resistance training improves strength, endurance, balance, coordination, reaction time, metabolism, and muscle mass. Flexibility exercise improves circulation, posture, and balance. Seek guidance from your physician and exercise physiologist before implementing an exercise routine and learn your capabilities and proper form for all exercise.  Introduction to Yoga  Clinical staff conducted group or individual video education with verbal and written material and guidebook.  Patient learns about yoga, a discipline of the coming together of mind, breath, and body. The benefits of yoga include improved flexibility, improved range of motion, better posture and core strength, increased lung function, weight loss, and positive self-image. Yoga's heart health benefits include lowered blood pressure, healthier heart rate, decreased cholesterol and triglyceride levels, improved immune function, and reduced stress. Seek guidance from your physician and exercise physiologist before implementing an exercise routine and learn your capabilities and proper form for all exercise.  Medical   Aging: Enhancing Your Quality of Life  Clinical  staff conducted group or individual video education with verbal and written material and guidebook.  Patient learns key strategies and recommendations to stay in good physical health and enhance quality of life, such as prevention strategies, having an advocate, securing a [place], and keeping a list of medications and system for tracking them. It also discusses how to avoid risk for bone loss.  Biology of Weight Control  Clinical staff conducted group or individual video education with verbal and written material and  guidebook.  Patient learns that weight gain occurs because we consume more calories than we burn (eating more, moving less). Even if your body weight is normal, you may have higher ratios of fat compared to muscle mass. Too much body fat puts you at increased risk for cardiovascular disease, heart attack, stroke, type 2 diabetes, and obesity-related cancers. In addition to exercise, following the [place] can help reduce your risk.  Decoding Lab Results  Clinical staff conducted group or individual video education with verbal and written material and guidebook.  Patient learns that lab test reflects one measurement whose values change over time and are influenced by many factors, including medication, stress, sleep, exercise, food, hydration, pre-existing medical conditions, and more. It is recommended to use the knowledge from this video to become more involved with your lab results and evaluate your numbers to speak with your doctor.   Diseases of Our Time - Overview  Clinical staff conducted group or individual video education with verbal and written material and guidebook.  Patient learns that according to the CDC, 50% to 70% of chronic diseases (such as obesity, type 2 diabetes, elevated lipids, hypertension, and heart disease) are avoidable through lifestyle improvements including healthier food choices, listening to satiety cues, and  increased physical activity.  Sleep Disorders Clinical staff conducted group or individual video education with verbal and written material and guidebook.  Patient learns how good quality and duration of sleep are important to overall health and well-being. Patient also learns about sleep disorders and how they impact health along with recommendations to address them, including discussing with a physician.  Nutrition  Dining Out - Part 2 Clinical staff conducted group or individual video education with verbal and written material and guidebook.  Patient learns how to plan ahead and communicate in order to maximize their dining experience in a healthy and nutritious manner. Included are recommended food choices based on the type of restaurant the patient is visiting.   Fueling a Best boy conducted group or individual video education with verbal and written material and guidebook.  There is a strong connection between our food choices and our health. Diseases like obesity and type 2 diabetes are very prevalent and are in large-part due to lifestyle choices. The Pritikin Eating Plan provides plenty of food and hunger-curbing satisfaction. It is easy to follow, affordable, and helps reduce health risks.  Menu Workshop  Clinical staff conducted group or individual video education with verbal and written material and guidebook.  Patient learns that restaurant meals can sabotage health goals because they are often packed with calories, fat, sodium, and sugar. Recommendations include strategies to plan ahead and to communicate with the manager, chef, or server to help order a healthier meal.  Planning Your Eating Strategy  Clinical staff conducted group or individual video education with verbal and written material and guidebook.  Patient learns about the [place] and its benefit of reducing the risk of disease. The [place] does not focus on calories.  Instead, it emphasizes high-quality, nutrient-rich foods. By knowing the characteristics of the foods, we choose, we can determine their calorie density and make informed decisions.  Targeting Your Nutrition Priorities  Clinical staff conducted group or individual video education with verbal and written material and guidebook.  Patient learns that lifestyle habits have a tremendous impact on disease risk and progression. This video provides eating and physical activity recommendations based on your personal health goals, such as reducing LDL  cholesterol, losing weight, preventing or controlling type 2 diabetes, and reducing high blood pressure.  Vitamins and Minerals  Clinical staff conducted group or individual video education with verbal and written material and guidebook.  Patient learns different ways to obtain key vitamins and minerals, including through a recommended healthy diet. It is important to discuss all supplements you take with your doctor.   Healthy Mind-Set    Smoking Cessation  Clinical staff conducted group or individual video education with verbal and written material and guidebook.  Patient learns that cigarette smoking and tobacco addiction pose a serious health risk which affects millions of people. Stopping smoking will significantly reduce the risk of heart disease, lung disease, and many forms of cancer. Recommended strategies for quitting are covered, including working with your doctor to develop a successful plan.  Culinary   Becoming a Financial trader conducted group or individual video education with verbal and written material and guidebook.  Patient learns that cooking at home can be healthy, cost-effective, quick, and puts them in control. Keys to cooking healthy recipes will include looking at your recipe, assessing your equipment needs, planning ahead, making it simple, choosing cost-effective seasonal ingredients, and limiting the use of added  fats, salts, and sugars.  Cooking - Breakfast and Snacks  Clinical staff conducted group or individual video education with verbal and written material and guidebook.  Patient learns how important breakfast is to satiety and nutrition through the entire day. Recommendations include key foods to eat during breakfast to help stabilize blood sugar levels and to prevent overeating at meals later in the day. Planning ahead is also a key component.  Cooking - Human resources officer conducted group or individual video education with verbal and written material and guidebook.  Patient learns eating strategies to improve overall health, including an approach to cook more at home. Recommendations include thinking of animal protein as a side on your plate rather than center stage and focusing instead on lower calorie dense options like vegetables, fruits, whole grains, and plant-based proteins, such as beans. Making sauces in large quantities to freeze for later and leaving the skin on your vegetables are also recommended to maximize your experience.  Cooking - Healthy Salads and Dressing Clinical staff conducted group or individual video education with verbal and written material and guidebook.  Patient learns that vegetables, fruits, whole grains, and legumes are the foundations of the [place]. Recommendations include how to incorporate each of these in flavorful and healthy salads, and how to create homemade salad dressings. Proper handling of ingredients is also covered. Cooking - Soups and [employer] - Soups and Desserts Clinical staff conducted group or individual video education with verbal and written material and guidebook.  Patient learns that Pritikin soups and desserts make for easy, nutritious, and delicious snacks and meal components that are low in sodium, fat, sugar, and calorie density, while high in vitamins, minerals, and filling fiber. Recommendations include  simple and healthy ideas for soups and desserts.   Overview     The Pritikin Solution Program Overview Clinical staff conducted group or individual video education with verbal and written material and guidebook.  Patient learns that the results of the [place] Program have been documented in more than 100 articles published in peer-reviewed journals, and the benefits include reducing risk factors for (and, in some cases, even reversing) high cholesterol, high blood pressure, type 2 diabetes, obesity, and more! An overview of the three  key pillars of the Pritikin Program will be covered: eating well, doing regular exercise, and having a healthy mind-set.  WORKSHOPS  Exercise: Exercise Basics: Building Your Action Plan Clinical staff led group instruction and group discussion with PowerPoint presentation and patient guidebook. To enhance the learning environment the use of posters, models and videos may be added. At the conclusion of this workshop, patients will comprehend the difference between physical activity and exercise, as well as the benefits of incorporating both, into their routine. Patients will understand the FITT (Frequency, Intensity, Time, and Type) principle and how to use it to build an exercise action plan. In addition, safety concerns and other considerations for exercise and cardiac rehab will be addressed by the presenter. The purpose of this lesson is to promote a comprehensive and effective weekly exercise routine in order to improve patients' overall level of fitness.   Managing Heart Disease: Your Path to a Healthier Heart Clinical staff led group instruction and group discussion with PowerPoint presentation and patient guidebook. To enhance the learning environment the use of posters, models and videos may be added.At the conclusion of this workshop, patients will understand the anatomy and physiology of the heart. Additionally, they will understand how Pritikin's three  pillars impact the risk factors, the progression, and the management of heart disease.  The purpose of this lesson is to provide a high-level overview of the heart, heart disease, and how the Pritikin lifestyle positively impacts risk factors.  Exercise Biomechanics Clinical staff led group instruction and group discussion with PowerPoint presentation and patient guidebook. To enhance the learning environment the use of posters, models and videos may be added. Patients will learn how the structural parts of their bodies function and how these functions impact their daily activities, movement, and exercise. Patients will learn how to promote a neutral spine, learn how to manage pain, and identify ways to improve their physical movement in order to promote healthy living. The purpose of this lesson is to expose patients to common physical limitations that impact physical activity. Participants will learn practical ways to adapt and manage aches and pains, and to minimize their effect on regular exercise. Patients will learn how to maintain good posture while sitting, walking, and lifting.  Balance Training and Fall Prevention  Clinical staff led group instruction and group discussion with PowerPoint presentation and patient guidebook. To enhance the learning environment the use of posters, models and videos may be added. At the conclusion of this workshop, patients will understand the importance of their sensorimotor skills (vision, proprioception, and the vestibular system) in maintaining their ability to balance as they age. Patients will apply a variety of balancing exercises that are appropriate for their current level of function. Patients will understand the common causes for poor balance, possible solutions to these problems, and ways to modify their physical environment in order to minimize their fall risk. The purpose of this lesson is to teach patients about the importance of maintaining  balance as they age and ways to minimize their risk of falling.  WORKSHOPS   Nutrition:  Fueling a Scientist, research (physical sciences) led group instruction and group discussion with PowerPoint presentation and patient guidebook. To enhance the learning environment the use of posters, models and videos may be added. Patients will review the foundational principles of the [place] and understand what constitutes a serving size in each of the food groups. Patients will also learn Pritikin-friendly foods that are better choices when away from home and review  make-ahead meal and snack options. Calorie density will be reviewed and applied to three nutrition priorities: weight maintenance, weight loss, and weight gain. The purpose of this lesson is to reinforce (in a group setting) the key concepts around what patients are recommended to eat and how to apply these guidelines when away from home by planning and selecting Pritikin-friendly options. Patients will understand how calorie density may be adjusted for different weight management goals.  Mindful Eating  Clinical staff led group instruction and group discussion with PowerPoint presentation and patient guidebook. To enhance the learning environment the use of posters, models and videos may be added. Patients will briefly review the concepts of the [place] and the importance of low-calorie dense foods. The concept of mindful eating will be introduced as well as the importance of paying attention to internal hunger signals. Triggers for non-hunger eating and techniques for dealing with triggers will be explored. The purpose of this lesson is to provide patients with the opportunity to review the basic principles of the [place], discuss the value of eating mindfully and how to measure internal cues of hunger and fullness using the Hunger Scale. Patients will also discuss reasons for non-hunger eating and learn strategies to use  for controlling emotional eating.  Targeting Your Nutrition Priorities Clinical staff led group instruction and group discussion with PowerPoint presentation and patient guidebook. To enhance the learning environment the use of posters, models and videos may be added. Patients will learn how to determine their genetic susceptibility to disease by reviewing their family history. Patients will gain insight into the importance of diet as part of an overall healthy lifestyle in mitigating the impact of genetics and other environmental insults. The purpose of this lesson is to provide patients with the opportunity to assess their personal nutrition priorities by looking at their family history, their own health history and current risk factors. Patients will also be able to discuss ways of prioritizing and modifying the [place] for their highest risk areas  Menu  Clinical staff led group instruction and group discussion with PowerPoint presentation and patient guidebook. To enhance the learning environment the use of posters, models and videos may be added. Using menus brought in from [employer], or printed from [employer], patients will apply the [place] dining out guidelines that were presented in the [employer] video. Patients will also be able to practice these guidelines in a variety of provided scenarios. The purpose of this lesson is to provide patients with the opportunity to practice hands-on learning of the [place] with actual menus and practice scenarios.  Label Reading Clinical staff led group instruction and group discussion with PowerPoint presentation and patient guidebook. To enhance the learning environment the use of posters, models and videos may be added. Patients will review and discuss the Pritikin label reading guidelines presented in Pritikin's Label Reading Educational series video. Using fool labels brought in from local  grocery stores and markets, patients will apply the label reading guidelines and determine if the packaged food meet the Pritikin guidelines. The purpose of this lesson is to provide patients with the opportunity to review, discuss, and practice hands-on learning of the Pritikin Label Reading guidelines with actual packaged food labels. [place] Workshops are designed to teach patients ways to prepare quick, simple, and affordable recipes at home. The importance of nutrition's role in chronic disease risk reduction is reflected in its emphasis in  the overall Pritikin program. By learning how to prepare essential core Pritikin Eating Plan recipes, patients will increase control over what they eat; be able to customize the flavor of foods without the use of added salt, sugar, or fat; and improve the quality of the food they consume. By learning a set of core recipes which are easily assembled, quickly prepared, and affordable, patients are more likely to prepare more healthy foods at home. These workshops focus on convenient breakfasts, simple entres, side dishes, and desserts which can be prepared with minimal effort and are consistent with nutrition recommendations for cardiovascular risk reduction. Cooking [employer] are taught by a Engineer, materials (RD) who has been trained by the [employer]. The chef or RD has a clear understanding of the importance of minimizing - if not completely eliminating - added fat, sugar, and sodium in recipes. Throughout the series of [place] Workshop sessions, patients will learn about healthy ingredients and efficient methods of cooking to build confidence in their capability to prepare    Cooking School weekly topics:  Adding Flavor- Sodium-Free  Fast and Healthy Breakfasts  Powerhouse Plant-Based Proteins  Satisfying Salads and Dressings  Simple Sides and Sauces  International Cuisine-Spotlight on  the [place] Zones  Delicious Desserts  Savory Soups  [employer] - Meals in a Agricultural consultant Appetizers and Snacks  Comforting Weekend Breakfasts  One-Pot Wonders   Fast Evening Meals  Contractor Your Pritikin Plate  WORKSHOPS   Healthy Mindset (Psychosocial):  Focused Goals, Sustainable Changes Clinical staff led group instruction and group discussion with PowerPoint presentation and patient guidebook. To enhance the learning environment the use of posters, models and videos may be added. Patients will be able to apply effective goal setting strategies to establish at least one personal goal, and then take consistent, meaningful action toward that goal. They will learn to identify common barriers to achieving personal goals and develop strategies to overcome them. Patients will also gain an understanding of how our mind-set can impact our ability to achieve goals and the importance of cultivating a positive and growth-oriented mind-set. The purpose of this lesson is to provide patients with a deeper understanding of how to set and achieve personal goals, as well as the tools and strategies needed to overcome common obstacles which may arise along the way.  From Head to Heart: The Power of a Healthy Outlook  Clinical staff led group instruction and group discussion with PowerPoint presentation and patient guidebook. To enhance the learning environment the use of posters, models and videos may be added. Patients will be able to recognize and describe the impact of emotions and mood on physical health. They will discover the importance of self-care and explore self-care practices which may work for them. Patients will also learn how to utilize the 4 C's to cultivate a healthier outlook and better manage stress and challenges. The purpose of this lesson is to demonstrate to patients how a healthy outlook is an essential part of maintaining good health, especially as they  continue their cardiac rehab journey.  Healthy Sleep for a Healthy Heart Clinical staff led group instruction and group discussion with PowerPoint presentation and patient guidebook. To enhance the learning environment the use of posters, models and videos may be added. At the conclusion of this workshop, patients will be able to demonstrate knowledge of the importance of sleep to overall health, well-being, and quality of life. They will understand the symptoms of,  and treatments for, common sleep disorders. Patients will also be able to identify daytime and nighttime behaviors which impact sleep, and they will be able to apply these tools to help manage sleep-related challenges. The purpose of this lesson is to provide patients with a general overview of sleep and outline the importance of quality sleep. Patients will learn about a few of the most common sleep disorders. Patients will also be introduced to the concept of "sleep hygiene," and discover ways to self-manage certain sleeping problems through simple daily behavior changes. Finally, the workshop will motivate patients by clarifying the links between quality sleep and their goals of heart-healthy living.   Recognizing and Reducing Stress Clinical staff led group instruction and group discussion with PowerPoint presentation and patient guidebook. To enhance the learning environment the use of posters, models and videos may be added. At the conclusion of this workshop, patients will be able to understand the types of stress reactions, differentiate between acute and chronic stress, and recognize the impact that chronic stress has on their health. They will also be able to apply different coping mechanisms, such as reframing negative self-talk. Patients will have the opportunity to practice a variety of stress management techniques, such as deep abdominal breathing, progressive muscle relaxation, and/or guided imagery.  The purpose of this lesson is to  educate patients on the role of stress in their lives and to provide healthy techniques for coping with it.  Learning Barriers/Preferences:  Learning Barriers/Preferences - 09/26/22 1606       Learning Barriers/Preferences   Learning Barriers Sight;Hearing   pt wears glasses and hearing aids   Learning Preferences Audio;Computer/Internet;Group Instruction;Individual Instruction;Pictoral;Skilled Demonstration;Verbal Instruction;Video;Written Material             Education Topics:  Knowledge Questionnaire Score:  Knowledge Questionnaire Score - 09/26/22 1606       Knowledge Questionnaire Score   Pre Score 16/24             Core Components/Risk Factors/Patient Goals at Admission:  Personal Goals and Risk Factors at Admission - 09/26/22 1609       Core Components/Risk Factors/Patient Goals on Admission    Weight Management Yes;Obesity;Weight Loss    Intervention Weight Management: Provide education and appropriate resources to help participant work on and attain dietary goals.;Weight Management: Develop a combined nutrition and exercise program designed to reach desired caloric intake, while maintaining appropriate intake of nutrient and fiber, sodium and fats, and appropriate energy expenditure required for the weight goal.;Weight Management/Obesity: Establish reasonable short term and long term weight goals.;Obesity: Provide education and appropriate resources to help participant work on and attain dietary goals.    Admit Weight 248 lb 7.3 oz (112.7 kg)    Goal Weight: Long Term 200 lb (90.7 kg)    Expected Outcomes Short Term: Continue to assess and modify interventions until short term weight is achieved;Long Term: Adherence to nutrition and physical activity/exercise program aimed toward attainment of established weight goal;Weight Loss: Understanding of general recommendations for a balanced deficit meal plan, which promotes 1-2 lb weight loss per week and includes a  negative energy balance of [phone] kcal/d;Understanding recommendations for meals to include 15-35% energy as protein, 25-35% energy from fat, 35-60% energy from carbohydrates, less than 200mg  of dietary cholesterol, 20-35 gm of total fiber daily;Understanding of distribution of calorie intake throughout the day with the consumption of 4-5 meals/snacks    Heart Failure Yes    Intervention Provide a combined exercise and nutrition program that is supplemented  with education, support and counseling about heart failure. Directed toward relieving symptoms such as shortness of breath, decreased exercise tolerance, and extremity edema.    Expected Outcomes Improve functional capacity of life;Short term: Attendance in program 2-3 days a week with increased exercise capacity. Reported lower sodium intake. Reported increased fruit and vegetable intake. Reports medication compliance.;Short term: Daily weights obtained and reported for increase. Utilizing diuretic protocols set by physician.;Long term: Adoption of self-care skills and reduction of barriers for early signs and symptoms recognition and intervention leading to self-care maintenance.    Hypertension Yes    Intervention Provide education on lifestyle modifcations including regular physical activity/exercise, weight management, moderate sodium restriction and increased consumption of fresh fruit, vegetables, and low fat dairy, alcohol moderation, and smoking cessation.;Monitor prescription use compliance.    Expected Outcomes Short Term: Continued assessment and intervention until BP is < 140/35mm HG in hypertensive participants. < 130/58mm HG in hypertensive participants with diabetes, heart failure or chronic kidney disease.;Long Term: Maintenance of blood pressure at goal levels.    Lipids Yes    Intervention Provide education and support for participant on nutrition & aerobic/resistive exercise along with prescribed medications to achieve LDL 70mg , HDL  >40mg .    Expected Outcomes Short Term: Participant states understanding of desired cholesterol values and is compliant with medications prescribed. Participant is following exercise prescription and nutrition guidelines.;Long Term: Cholesterol controlled with medications as prescribed, with individualized exercise RX and with personalized nutrition plan. Value goals: LDL < 70mg , HDL > 40 mg.    Stress Yes    Intervention Offer individual and/or small group education and counseling on adjustment to heart disease, stress management and health-related lifestyle change. Teach and support self-help strategies.;Refer participants experiencing significant psychosocial distress to appropriate mental health specialists for further evaluation and treatment. When possible, include family members and significant others in education/counseling sessions.    Expected Outcomes Short Term: Participant demonstrates changes in health-related behavior, relaxation and other stress management skills, ability to obtain effective social support, and compliance with psychotropic medications if prescribed.;Long Term: Emotional wellbeing is indicated by absence of clinically significant psychosocial distress or social isolation.             Core Components/Risk Factors/Patient Goals Review:   Goals and Risk Factor Review     Row Name 10/02/22 1546 10/15/22 1115 11/07/22 0841         Core Components/Risk Factors/Patient Goals Review   Personal Goals Review Weight Management/Obesity;Heart Failure;Stress;Hypertension;Lipids Weight Management/Obesity;Heart Failure;Stress;Hypertension;Lipids Weight Management/Obesity;Heart Failure;Stress;Hypertension;Lipids     Review Kennita started intensive cardiac rehab on  10/02/22. Treana did well with exercise. Vital signs stable. Carmindy is somewhat decondtioned. Karra enjoyed her first day of exercise. Winnell started intensive cardiac rehab on  10/02/22. Francheska is off to a good start to exercise.  Vital signs stable. Kaijah has lost 4.6 kg since starting cardiac rehab Adyn's last day of attendance at cardiac rehab was on 10/09/22. Patie t has been absent since     Expected Outcomes Adisyn will continue to participate in intensive cardiac rehab for exercise, nutrition and lifestyle modifications Livya will continue to participate in intensive cardiac rehab for exercise, nutrition and lifestyle modifications Ilissa will continue to participate in intensive cardiac rehab for exercise, nutrition and lifestyle modifications              Core Components/Risk Factors/Patient Goals at Discharge (Final Review):   Goals and Risk Factor Review - 11/07/22 0841       Core Components/Risk Factors/Patient  Goals Review   Personal Goals Review Weight Management/Obesity;Heart Failure;Stress;Hypertension;Lipids    Review Drema's last day of attendance at cardiac rehab was on 10/09/22. Patie t has been absent since    Expected Outcomes Saqqara will continue to participate in intensive cardiac rehab for exercise, nutrition and lifestyle modifications             ITP Comments:  ITP Comments     Row Name 09/26/22 1424 10/15/22 1112         ITP Comments Dr. Fransico Him medical director. Introduction to pritikin education program/ intensive cardiac rehab. Initial orientation packet reviewed with patient. 30 Day ITP Review. Saadia started intensive cardiac rehab on 10/02/22 and is off to a good start toexercise. Dim fell at home vaccuming at home yesterday 10/14/22 and will need to be evaluated by her primary care provider before returning to exercise               Comments: See ITP comments.Harrell Gave RN BSN

## 2022-11-08 ENCOUNTER — Encounter (HOSPITAL_COMMUNITY): Payer: Medicare Other

## 2022-11-08 ENCOUNTER — Telehealth (HOSPITAL_COMMUNITY): Payer: Self-pay | Admitting: *Deleted

## 2022-11-08 NOTE — Telephone Encounter (Signed)
Patient says she has had a cold and has taken 2 z packs. Brittany Jackson says she will be out of town in April and has an appointment with her Kidney doctor on April 3rd. Brittany Jackson plans to return to exercise on April 5th.Will cancel appointments until that point.Harrell Gave RN BSN

## 2022-11-11 ENCOUNTER — Encounter (HOSPITAL_COMMUNITY): Payer: Medicare Other

## 2022-11-13 ENCOUNTER — Encounter (HOSPITAL_COMMUNITY): Payer: Medicare Other

## 2022-11-13 NOTE — Telephone Encounter (Signed)
g

## 2022-11-15 ENCOUNTER — Encounter (HOSPITAL_COMMUNITY): Payer: Medicare Other

## 2022-11-18 ENCOUNTER — Encounter (HOSPITAL_COMMUNITY): Payer: Medicare Other

## 2022-11-20 ENCOUNTER — Encounter (HOSPITAL_COMMUNITY): Payer: Medicare Other

## 2022-11-20 DIAGNOSIS — I129 Hypertensive chronic kidney disease with stage 1 through stage 4 chronic kidney disease, or unspecified chronic kidney disease: Secondary | ICD-10-CM | POA: Diagnosis not present

## 2022-11-20 DIAGNOSIS — N2581 Secondary hyperparathyroidism of renal origin: Secondary | ICD-10-CM | POA: Diagnosis not present

## 2022-11-20 DIAGNOSIS — N184 Chronic kidney disease, stage 4 (severe): Secondary | ICD-10-CM | POA: Diagnosis not present

## 2022-11-20 DIAGNOSIS — D649 Anemia, unspecified: Secondary | ICD-10-CM | POA: Diagnosis not present

## 2022-11-22 ENCOUNTER — Encounter (HOSPITAL_COMMUNITY): Payer: Medicare Other

## 2022-11-25 ENCOUNTER — Encounter (HOSPITAL_COMMUNITY): Payer: Medicare Other

## 2022-11-27 ENCOUNTER — Encounter (HOSPITAL_COMMUNITY): Payer: Medicare Other

## 2022-11-29 ENCOUNTER — Encounter (HOSPITAL_COMMUNITY): Payer: Self-pay | Admitting: *Deleted

## 2022-11-29 ENCOUNTER — Encounter (HOSPITAL_COMMUNITY): Payer: Medicare Other

## 2022-11-29 NOTE — Progress Notes (Signed)
Discharge Progress Report  Patient Details  Name: Brittany Jackson MRN: [id number] Date of Birth: Nov 29, 1940 Referring Provider:   Flowsheet Row INTENSIVE CARDIAC REHAB ORIENT from 09/26/2022 in [facility]  Referring Provider Marca Ancona, MD        Number of Visits: 8  Reason for Discharge:  Early Exit:  Lack of attendance  Smoking History:  Social History   Tobacco Use  Smoking Status Former   Packs/day: 1.00   Years: 60.00   Additional pack years: 0.00   Total pack years: 60.00   Types: Cigarettes   Start date: 59   Quit date: 01/08/2022   Years since quitting: 0.8  Smokeless Tobacco Never    Diagnosis:  No diagnosis found.  ADL UCSD:   Initial Exercise Prescription:   Discharge Exercise Prescription (Final Exercise Prescription Changes):  Exercise Prescription Changes - 10/02/22 1615       Response to Exercise   Blood Pressure (Admit) 120/70    Blood Pressure (Exercise) 144/68    Blood Pressure (Exit) 108/80    Heart Rate (Admit) 69 bpm    Heart Rate (Exercise) 109 bpm    Heart Rate (Exit) 75 bpm    Rating of Perceived Exertion (Exercise) 13    Symptoms none    Comments Pt first day in teh Pritikin ICR program    Duration Progress to 30 minutes of  aerobic without signs/symptoms of physical distress    Intensity THRR unchanged      Progression   Progression Continue to progress workloads to maintain intensity without signs/symptoms of physical distress.    Average METs 2.3      Resistance Training   Training Prescription No      NuStep   Level 2    SPM 95    Minutes 25    METs 2.3             Functional Capacity:   Psychological, QOL, Others - Outcomes: PHQ 2/9:    09/26/2022    3:54 PM  Depression screen PHQ 2/9  Decreased Interest 0  Down, Depressed, Hopeless 1  PHQ - 2 Score 1  Altered sleeping 0  Tired, decreased energy 2  Change in appetite 3  Feeling bad or  failure about yourself  2  Trouble concentrating 0  Moving slowly or fidgety/restless 1  Suicidal thoughts 0  PHQ-9 Score 9  Difficult doing work/chores Not difficult at all    Quality of Life:   Personal Goals: Goals established at orientation with interventions provided to work toward goal.    Personal Goals Discharge:  Goals and Risk Factor Review     Row Name 10/02/22 1546 10/15/22 1115 11/07/22 0841         Core Components/Risk Factors/Patient Goals Review   Personal Goals Review Weight Management/Obesity;Heart Failure;Stress;Hypertension;Lipids Weight Management/Obesity;Heart Failure;Stress;Hypertension;Lipids Weight Management/Obesity;Heart Failure;Stress;Hypertension;Lipids     Review Evamaria started intensive cardiac rehab on  10/02/22. Kirstan did well with exercise. Vital signs stable. Katie is somewhat decondtioned. Latiana enjoyed her first day of exercise. Amalia started intensive cardiac rehab on  10/02/22. Joslynne is off to a good start to exercise. Vital signs stable. Raeghan has lost 4.6 kg since starting cardiac rehab Elisabeth's last day of attendance at cardiac rehab was on 10/09/22. Patie t has been absent since     Expected Outcomes Myosha will continue to participate in intensive cardiac rehab for exercise, nutrition and lifestyle modifications Raye will continue  to participate in intensive cardiac rehab for exercise, nutrition and lifestyle modifications Taylr will continue to participate in intensive cardiac rehab for exercise, nutrition and lifestyle modifications              Exercise Goals and Review:   Exercise Goals Re-Evaluation:  Exercise Goals Re-Evaluation     Row Name 10/02/22 1618             Exercise Goal Re-Evaluation   Exercise Goals Review Increase Physical Activity;Understanding of Exercise Prescription;Increase Strength and Stamina;Knowledge and understanding of Target Heart Rate Range (THRR);Able to understand and use rate of perceived exertion (RPE)  scale       Comments Pt first day in the CRP2 program, Pt tolerated exercise well with an average MET level of 2.3. Pt is learning her THRR, RPE and ExRx       Expected Outcomes Will continue to monitor pt and progress workloads as tolerated without sign or symptom.                Nutrition & Weight - Outcomes:    Nutrition:  Nutrition Therapy & Goals - 11/04/22 0912       Nutrition Therapy   Diet Heart healthy diet    Drug/Food Interactions Statins/Certain Fruits      Personal Nutrition Goals   Nutrition Goal Patient to identify strategies for reducing cardiovascular risk by attending the weekly Pritikin education and nutrition series    Personal Goal #2 Patient to improve diet quality by using the plate method as a daily guide for meal planning to include lean protein/plant protein, fruits, vegetables, whole grains, nonfat dairy as part of well balanced diet    Personal Goal #3 Patient to reduce sodium intake to 1500mg  per day    Comments Patient has not attended cardiac rehab since 10/09/22. Goals have not been reassessed due to attendance. Patient will continue to benefit from participation in intensive cardiac rehab for nutrition, exercise, and lifestyle modfications as able.      Intervention Plan   Intervention Prescribe, educate and counsel regarding individualized specific dietary modifications aiming towards targeted core components such as weight, hypertension, lipid management, diabetes, heart failure and other comorbidities.;Nutrition handout(s) given to patient.    Expected Outcomes Short Term Goal: Understand basic principles of dietary content, such as calories, fat, sodium, cholesterol and nutrients.;Long Term Goal: Adherence to prescribed nutrition plan.             Nutrition Discharge:   Education Questionnaire Score:  Kirsi attended 8 exercise session between 09/26/22-10/09/22. Orlene did well with exercise. Aradhya was out due to a cold and did not return.  Thayer Headings RN BSN

## 2022-12-02 ENCOUNTER — Encounter (HOSPITAL_COMMUNITY): Payer: Medicare Other

## 2022-12-04 ENCOUNTER — Encounter (HOSPITAL_COMMUNITY): Payer: Medicare Other

## 2022-12-06 ENCOUNTER — Encounter (HOSPITAL_COMMUNITY): Payer: Medicare Other

## 2022-12-27 ENCOUNTER — Ambulatory Visit (HOSPITAL_COMMUNITY)
Admission: RE | Admit: 2022-12-27 | Discharge: 2022-12-27 | Disposition: A | Discharge status: Home / Self Care | Payer: Medicare Other | Source: Ambulatory Visit | Attending: Cardiology | Admitting: Cardiology

## 2022-12-27 ENCOUNTER — Encounter (HOSPITAL_COMMUNITY): Payer: Self-pay | Admitting: Cardiology

## 2022-12-27 VITALS — BP 124/60 | HR 65 | Wt 254.0 lb

## 2022-12-27 DIAGNOSIS — Z79899 Other long term (current) drug therapy: Secondary | ICD-10-CM | POA: Diagnosis not present

## 2022-12-27 DIAGNOSIS — Z955 Presence of coronary angioplasty implant and graft: Secondary | ICD-10-CM | POA: Insufficient documentation

## 2022-12-27 DIAGNOSIS — I5042 Chronic combined systolic (congestive) and diastolic (congestive) heart failure: Secondary | ICD-10-CM | POA: Diagnosis not present

## 2022-12-27 DIAGNOSIS — I5032 Chronic diastolic (congestive) heart failure: Secondary | ICD-10-CM | POA: Diagnosis not present

## 2022-12-27 DIAGNOSIS — I21A1 Myocardial infarction type 2: Secondary | ICD-10-CM | POA: Diagnosis not present

## 2022-12-27 DIAGNOSIS — I2510 Atherosclerotic heart disease of native coronary artery without angina pectoris: Secondary | ICD-10-CM | POA: Diagnosis not present

## 2022-12-27 DIAGNOSIS — Z7984 Long term (current) use of oral hypoglycemic drugs: Secondary | ICD-10-CM | POA: Insufficient documentation

## 2022-12-27 DIAGNOSIS — Z7901 Long term (current) use of anticoagulants: Secondary | ICD-10-CM | POA: Diagnosis not present

## 2022-12-27 DIAGNOSIS — I480 Paroxysmal atrial fibrillation: Secondary | ICD-10-CM | POA: Diagnosis not present

## 2022-12-27 DIAGNOSIS — I130 Hypertensive heart and chronic kidney disease with heart failure and stage 1 through stage 4 chronic kidney disease, or unspecified chronic kidney disease: Secondary | ICD-10-CM | POA: Diagnosis not present

## 2022-12-27 DIAGNOSIS — Z87891 Personal history of nicotine dependence: Secondary | ICD-10-CM | POA: Insufficient documentation

## 2022-12-27 DIAGNOSIS — J449 Chronic obstructive pulmonary disease, unspecified: Secondary | ICD-10-CM | POA: Insufficient documentation

## 2022-12-27 DIAGNOSIS — E669 Obesity, unspecified: Secondary | ICD-10-CM | POA: Diagnosis not present

## 2022-12-27 DIAGNOSIS — I2102 ST elevation (STEMI) myocardial infarction involving left anterior descending coronary artery: Secondary | ICD-10-CM

## 2022-12-27 DIAGNOSIS — N179 Acute kidney failure, unspecified: Secondary | ICD-10-CM | POA: Insufficient documentation

## 2022-12-27 DIAGNOSIS — N1831 Chronic kidney disease, stage 3a: Secondary | ICD-10-CM | POA: Insufficient documentation

## 2022-12-27 LAB — COMPREHENSIVE METABOLIC PANEL
ALT: 18 U/L (ref 0–44)
AST: 24 U/L (ref 15–41)
Albumin: 3.8 g/dL (ref 3.5–5.0)
Alkaline Phosphatase: 101 U/L (ref 38–126)
Anion gap: 14 (ref 5–15)
BUN: 36 mg/dL — ABNORMAL HIGH (ref 8–23)
CO2: 21 mmol/L — ABNORMAL LOW (ref 22–32)
Calcium: 9 mg/dL (ref 8.9–10.3)
Chloride: 101 mmol/L (ref 98–111)
Creatinine, Ser: 2.1 mg/dL — ABNORMAL HIGH (ref 0.44–1.00)
GFR, Estimated: 23 mL/min — ABNORMAL LOW (ref >60)
Glucose, Bld: 92 mg/dL (ref 70–99)
Potassium: 3.7 mmol/L (ref 3.5–5.1)
Sodium: 136 mmol/L (ref 135–145)
Total Bilirubin: 0.4 mg/dL (ref 0.3–1.2)
Total Protein: 7 g/dL (ref 6.5–8.1)

## 2022-12-27 LAB — TSH: TSH: 1.283 u[IU]/mL (ref 0.350–4.500)

## 2022-12-27 LAB — LIPID PANEL
Cholesterol: 158 mg/dL (ref 0–200)
HDL: 74 mg/dL (ref >40)
LDL Cholesterol: 63 mg/dL (ref 0–99)
Total CHOL/HDL Ratio: 2.1 RATIO
Triglycerides: 106 mg/dL (ref <150)
VLDL: 21 mg/dL (ref 0–40)

## 2022-12-27 LAB — HEMOGLOBIN A1C
Hgb A1c MFr Bld: 5.7 % — ABNORMAL HIGH (ref 4.8–5.6)
Mean Plasma Glucose: 116.89 mg/dL

## 2022-12-27 MED ORDER — APIXABAN 2.5 MG PO TABS: 2.5000 mg | ORAL_TABLET | Freq: Two times a day (BID) | ORAL | 11 refills | Status: DC

## 2022-12-27 NOTE — Patient Instructions (Signed)
No changes to your medications.  Labs done today, your results will be available in MyChart, we will contact you for abnormal readings.  You have been referred to cardiac rehab. They will call you to arrange your appointment.  You have been [facility] the [facility]. They will call you to arrange your appointment.  Your physician recommends that you schedule a follow-up appointment in: 4 months  If you have any questions or concerns before your next appointment please send [place] a message through [place] or call our office at [phone].    TO LEAVE A MESSAGE FOR THE NURSE SELECT OPTION 2, PLEASE LEAVE A MESSAGE INCLUDING: YOUR NAME DATE OF BIRTH CALL BACK NUMBER REASON FOR CALL**this is important as we prioritize the call backs  YOU WILL RECEIVE A CALL BACK THE SAME DAY AS LONG AS YOU CALL BEFORE 4:00 PM  At the [facility], you and your health needs are our priority. As part of our continuing mission to provide you with exceptional heart care, we have created designated Provider Care Teams. These Care Teams include your primary Cardiologist (physician) and Advanced Practice Providers (APPs- Physician Assistants and Nurse Practitioners) who all work together to provide you with the care you need, when you need it.   You may see any of the following providers on your designated Care Team at your next follow up: Dr Arvilla Meres Dr Marca Ancona Dr. Marcos Eke, NP Robbie Lis, [place] [facility] [place], [place] Brynda Peon, NP Karle Plumber, PharmD   Please be sure to bring in all your medications bottles to every appointment.    Thank you for choosing [facility]

## 2022-12-29 NOTE — Progress Notes (Signed)
PCP: Elfredia Nevins, MD HF Cardiologist: Dr Shirlee Latch Nephrology:  [facility]    HPI: Ms Resendez is a 82 y.o. female with history of chronic diastolic CHF, tobacco use, HTN, former smoker, and COPD.   Admitted 01/08/22 with inferolateral STEMI c/b acute systolic CHF with low-output. No intervention initially. She was seen by CT surgery, CABG not recommended.  Hospital course c/b AKI on CKD Stage IIIa, HAP, and leukocytosis. Briefly on CVVHD and had gradual improvement. Creatinine at d/c down to 2.0.  Developed rash felt to be in the setting of amiodarone. Treated with steroids and switched to amio without dye with improvement. Discharged on 01/22/22. She had paroxysmal atrial fibrillation during this admission.   Echo in 8/23 showed EF 65-70%, normal RV, mild MR, IVC normal. CT chest in 9/23 showed emphysema.   Follow up 10/23, NYHA I-II, BP stable so midodrine stopped. SGLT2i started and torsemide decreased to 20 mg every other day.    Patient returns for followup of CHF, atrial fibrillation, CAD.  She has not had any chest pain.  She fell twice in the last few months due to tripping.  No lightheadedness/syncope.  She can climb up a flight of stairs without dyspnea.  She is short of breath carrying the laundary basket.  No dyspnea walking on flat ground.  Weight up 14 lbs.  Did not get to cardiac rehab over the winter due to bronchitis/colds.  Wants to try again.  She is not smoking.   ECG (personally reviewed): NSR, 1st degree AVB 222 msec  Labs (7/23): K 4.6, creatinine 2.4, BNP 48 Labs (8/23): LDL 71, HDL 56, hgb 10.8, TSH normal, K 4.5, creatinine 2.05 Labs (10/23): LFTs normal, TSH normal Labs (11/23): K 4.2, creatinine 2.12 Labs (1/24): K 4.3, creatinine 1.99, BNP 70  PMH: 1. Inflammatory dermatosis 2. HTN 3. CKD stage 3 4. COPD: Smoker until 5/23.  Obstructive PFTs in 5/23.  - CT chest (9/23) with emphysema.  5. Atrial fibrillation: Paroxysmal.  6. Cardiomyopathy: Initially  thought to be ischemic but suspect stress (Takotsubo-type) CMP given rapid improvement.  - Echo (5/23): EF 20-25%, mildly decreased RV systolic function, trivial MR, dilated IVC.  - Echo (5/23, repeat): improvement in EF to 65-70%  - Echo (8/23): EF 65-70%, normal RV, mild MR, IVC normal. 7. CAD: inferolateral STEMI by ECG 5/23 with HS-TnI in 3000 range, LHC with 3VD => 50-60% dLM, ?90% ostial LAD, 75% stenosis at bifurcation of mLAD and D, 75% stenosis at bifurcation of mLCx and OM.  The ostial LAD is not completely laid out and on review, I am not sure that it is truly critical.   ROS: All systems negative except as listed in HPI, PMH and Problem List.  SH:  Social History   Socioeconomic History   Marital status: Widowed    Spouse name: Not on file   Number of children: Not on file   Years of education: Not on file   Highest education level: Not on file  Occupational History   Not on file  Tobacco Use   Smoking status: Former    Packs/day: 1.00    Years: 60.00    Additional pack years: 0.00    Total pack years: 60.00    Types: Cigarettes    Start date: 73    Quit date: 01/08/2022    Years since quitting: 0.9   Smokeless tobacco: Never  Vaping Use   Vaping Use: Never used  Substance and Sexual Activity   Alcohol use:  Yes    Comment: occasional   Drug use: No   Sexual activity: Never    Birth control/protection: Surgical  Other Topics Concern   Not on file  Social History Narrative   Not on file   Social Determinants of Health   Financial Resource Strain: Not on file  Food Insecurity: No Food Insecurity (01/22/2022)   Hunger Vital Sign    Worried About Running Out of Food in the Last Year: Never true    Ran Out of Food in the Last Year: Never true  Transportation Needs: No Transportation Needs (01/22/2022)   PRAPARE - Administrator, Civil Service (Medical): No    Lack of Transportation (Non-Medical): No  Physical Activity: Not on file  Stress: Not on  file  Social Connections: Not on file  Intimate Partner Violence: Not on file   FH:  Family History  Problem Relation Age of Onset   Hypertension Sister    Allergic rhinitis Neg Hx    Asthma Neg Hx    Angioedema Neg Hx    Atopy Neg Hx    Eczema Neg Hx    Immunodeficiency Neg Hx    Urticaria Neg Hx    Past Medical History:  Diagnosis Date   CHF (congestive heart failure) (HCC)    Hypertension    Urticaria    Current Outpatient Medications  Medication Sig Dispense Refill   acetaminophen (TYLENOL) 500 MG tablet Take 500-1,000 mg by mouth every 6 (six) hours as needed (pain.).     albuterol (VENTOLIN HFA) 108 (90 Base) MCG/ACT inhaler Inhale 2 puffs into the lungs every 6 (six) hours as needed for wheezing or shortness of breath. 8 g 6   amiodarone (PACERONE) 200 MG tablet Take 0.5 tablets (100 mg total) by mouth daily. 45 tablet 3   atorvastatin (LIPITOR) 80 MG tablet TAKE ONE TABLET BY MOUTH DAILY 30 tablet 5   Coenzyme Q10 (COQ10 PO) Take 1 capsule by mouth in the morning.     colchicine 0.6 MG tablet Take 0.6 mg by mouth 2 (two) times daily as needed (onset of gout).     diazepam (VALIUM) 10 MG tablet Take 10 mg by mouth 3 (three) times daily as needed for anxiety.     empagliflozin (JARDIANCE) 10 MG TABS tablet Take 1 tablet (10 mg total) by mouth daily before breakfast. 14 tablet 11   hydrocortisone cream 1 % Apply topically as needed for itching. 28 g 0   Multiple Vitamins-Minerals (ADULT ONE DAILY GUMMIES PO) Take 1 tablet by mouth in the morning.     Red Yeast Rice 600 MG CAPS Take 600 mg by mouth daily.     torsemide (DEMADEX) 20 MG tablet Take 1 tablet (20 mg total) by mouth every other day. 45 tablet 3   apixaban (ELIQUIS) 2.5 MG TABS tablet Take 1 tablet (2.5 mg total) by mouth 2 (two) times daily. 60 tablet 11   umeclidinium-vilanterol (ANORO ELLIPTA) 62.5-25 MCG/ACT AEPB Inhale 1 puff into the lungs daily. (Patient not taking: Reported on 06/17/2022) 60 each 5   No  current facility-administered medications for this encounter.   BP 124/60   Pulse 65   Wt 115.2 kg (254 lb)   SpO2 96%   BMI 42.27 kg/m   Wt Readings from Last 3 Encounters:  12/27/22 115.2 kg (254 lb)  09/26/22 112.7 kg (248 lb 7.3 oz)  08/26/22 108.9 kg (240 lb)   PHYSICAL EXAM: General: NAD Neck: No JVD, no  thyromegaly or thyroid nodule.  Lungs: Clear to auscultation bilaterally with normal respiratory effort. CV: Nondisplaced PMI.  Heart regular S1/S2, no S3/S4, no murmur.  No peripheral edema.  No carotid bruit.  Normal pedal pulses.  Abdomen: Soft, nontender, no hepatosplenomegaly, no distention.  Skin: Intact without lesions or rashes.  Neurologic: Alert and oriented x 3.  Psych: Normal affect. Extremities: No clubbing or cyanosis.  HEENT: Normal.   ASSESSMENT/PLAN: 1. CAD: Patient presented with ACS in 5/23, peak HS-TnI 3557. LHC with 3VD => 50-60% dLM, ?90% ostial LAD, 75% stenosis at bifurcation of mLAD and D, 75% stenosis at bifurcation of mLCx and OM.  The ostial LAD is not completely laid out and on review, not sure that it is truly critical.  CABG would be a consideration if LAD disease is severe, may require FFR to determine this. PCI distal left main into LAD also an option if FFR shows severe disease.  She has had no ischemic chest pain or dyspnea and EF had recovered on last echo in 8/23. Think we can hold off on further evaluation for now, especially as creatinine remains elevated (last creatinine was 1.99).  - Not on ASA given apixaban use.      - Continue high intensity statin, check lipids today.  - I will refer her back to cardiac rehab. 2. Cardiomyopathy: Possible stress (Takotsubo-type) cardiomyopathy. Patient has CAD as above, but not sure it explains the extent of her initial cardiomyopathy in 5/23 and her rapid improvement.  Low output on initial RHC in 5/23 with CI 1.8.  Echo in 5/23 initially with EF 20-25%, mildly decreased RV systolic function, trivial  MR, dilated IVC. Repeat echo later in 5/23 with improvement in EF to 65-70% => suspect Takotsubo CMP.  Echo in 8/23 showed EF 65-70%, normal RV, mild MR, IVC normal.  NYHA class II symptoms.  She is not volume overloaded.  - Continue torsemide 20 mg every other day. BMET and BNP today. - Continue Jardiance 10 mg daily.  - Off midodrine with stable BP. 3.  Atrial fibrillation: Paroxysmal.  She is in NSR.  - Continue Eliquis.   - Continue amiodarone 100 mg daily. Check lipids/LFTs, will need regular eye exam.  4.  CKD Stage 3: AKI in 5/23 with transient CVVH.   - She followed with Nephrology. - BMET today. 5. COPD: Patient has quit smoking.  - She sees pulmonary.  6. Obesity: Interested in GLP-1 agonist.  - I will check HgbA1c today.  - Send to pharmacy clinic to see if she can get semaglutide or tirzepatide.   Followup APP 4 months.   Marca Ancona  12/29/2022

## 2023-01-02 ENCOUNTER — Ambulatory Visit: Payer: Medicare Other

## 2023-01-02 ENCOUNTER — Telehealth (HOSPITAL_COMMUNITY): Payer: Self-pay

## 2023-01-02 NOTE — Telephone Encounter (Signed)
Called pt to see if she wanted to reschedule for cardiac rehab pt stated that she does want to reschedule but then stated that she is having an issue of incontinence that started 01/01/23.I advised pt that due to her insurance she has to schedule on or before 01/09/23 in order for her insurance to cover her cardiac rehab. I advised pt that I would have to speak with my nurse navigator or a nurse and give her a call back at later date before scheduling.

## 2023-01-06 ENCOUNTER — Telehealth (HOSPITAL_COMMUNITY): Payer: Self-pay

## 2023-01-06 ENCOUNTER — Telehealth (HOSPITAL_COMMUNITY): Payer: Self-pay | Admitting: *Deleted

## 2023-01-06 NOTE — Telephone Encounter (Signed)
Asked by support staff to contact pt regarding symptoms reported and scheduling for Cardiac Rehab.  Called and spoke to pt who feels much better.  Pt thinks she had food poisoning from a fish fillet sandwich she ate at [employer].  Pt last loose stool was on Thursday.  No lingering nausea and had a normal stool on Sunday.  Ok to proceed with scheduling CR. Aware must be start this week for insurance reimbursement. Alanson Aly, BSN Cardiac and Emergency planning/management officer

## 2023-01-06 NOTE — Telephone Encounter (Signed)
Called patient to see if she was interested in participating in the Cardiac Rehab Program. Patient stated yes. Patient will come in for orientation on 01/09/23@930  and will attend the 1:45 exercise class.

## 2023-01-06 NOTE — Telephone Encounter (Signed)
Pt insurance is active and benefits verified through Medicare a/b Co-pay 0, DED $240/$240 met, out of pocket 0/0 met, co-insurance 20%. no pre-authorization required. Passport, 01/06/2023@2 :83, REF# [id number]   2ndary insurance is active and benefits verified through [facility]. Co-pay 0, DED 0/0 met, out of pocket 0/0 met, co-insurance 0%. No pre-authorization required.    How many CR sessions are covered? (72 sessions/visits for ICR) 72 Is this a lifetime maximum or an annual maximum? annual Has the member used any of these services to date? no Is there a time limit (weeks/months) on start of program and/or program completion? no

## 2023-01-07 ENCOUNTER — Telehealth (HOSPITAL_COMMUNITY): Payer: Self-pay

## 2023-01-07 NOTE — Telephone Encounter (Signed)
Reviewed with patient the Cardiac Rehab Cardiac Risk [facility] Nursing Assessment. Patient knows where our office is located, and to wear comfortable clothing/shoes. She stated she is unsteady and has fallen X 3 this past year. She said she will bring a cane, but hopes she won't have to use it.

## 2023-01-08 ENCOUNTER — Telehealth: Payer: Self-pay | Admitting: Primary Care

## 2023-01-08 ENCOUNTER — Encounter: Payer: Self-pay | Admitting: Primary Care

## 2023-01-08 ENCOUNTER — Ambulatory Visit (INDEPENDENT_AMBULATORY_CARE_PROVIDER_SITE_OTHER): Payer: Medicare Other | Admitting: Primary Care

## 2023-01-08 VITALS — BP 118/60 | HR 68 | Temp 97.9°F | Ht 65.0 in | Wt 247.6 lb

## 2023-01-08 DIAGNOSIS — J4489 Other specified chronic obstructive pulmonary disease: Secondary | ICD-10-CM | POA: Diagnosis not present

## 2023-01-08 MED ORDER — STIOLTO RESPIMAT 2.5-2.5 MCG/ACT IN AERS: 2.0000 | INHALATION_SPRAY | Freq: Every day | RESPIRATORY_TRACT | 0 refills | Status: DC

## 2023-01-08 NOTE — Patient Instructions (Signed)
CT chest in September 2023 showed lungs were clear, no suspicious pulmonary nodules. Mild emphysema and aortic atherosclerosis   Recommendations: Resume Anoro- take 1 puff daily in the morning  (sample given- I will check with pharmacy to see what inhaler is covered under your insurance similar to Anoro and we will send that prescription in)   Follow-up: 6 months with Waynetta Sandy NP

## 2023-01-08 NOTE — Progress Notes (Addendum)
@Patient  ID: Brittany Jackson, female    DOB: 1941/02/17, 82 y.o.   MRN: [id number]  Chief Complaint  Patient presents with   Follow-up    Follow up OV from Dr. Kendrick Fries.  SOB with exertion.    Referring provider: Elfredia Nevins, MD  HPI: 82 year old female, former smoker. Medical history significant for COPD with bronchitis, STEMI, hypertension, heart failure, obesity, tobacco abuse. Former patient of Dr. Kendrick Fries.  01/08/2023 Patient presents today for follow-up. Patient has COPD with chronic bronchitis. Her breathing has been alright. She has no acute respiratory symptoms today. She experiences mild dyspnea if she exerts herself. She is able to go up stairs without difficulty but would get short winded if she were to walk up a hill. She is able to do all ADLs. Not currently on maintenance inhaler, Anoro was too expensive. CAT score 7.    Allergies  Allergen Reactions   Chlorhexidine Other (See Comments)    Burns skin immediately   Codeine Other (See Comments)    Unknown   Meprobamate Swelling   Sulfa Antibiotics Other (See Comments)    Unknown    Immunization History  Administered Date(s) Administered   Influenza, High Dose Seasonal PF 07/29/2019   PFIZER(Purple Top)SARS-COV-2 Vaccination 09/07/2019, 09/28/2019, 08/04/2020    Past Medical History:  Diagnosis Date   CHF (congestive heart failure) (HCC)    Hypertension    Urticaria     Tobacco History: Social History   Tobacco Use  Smoking Status Former   Current packs/day: 0.00   Average packs/day: 1 pack/day for 65.4 years (65.4 ttl pk-yrs)   Types: Cigarettes   Start date: 17   Quit date: 01/08/2022   Years since quitting: 1.2  Smokeless Tobacco Never   Counseling given: Not Answered   Outpatient Medications Prior to Visit  Medication Sig Dispense Refill   acetaminophen (TYLENOL) 500 MG tablet Take 500-1,000 mg by mouth every 6 (six) hours as needed (pain.).     albuterol (VENTOLIN HFA) 108 (90 Base)  MCG/ACT inhaler Inhale 2 puffs into the lungs every 6 (six) hours as needed for wheezing or shortness of breath. 8 g 6   amiodarone (PACERONE) 200 MG tablet Take 0.5 tablets (100 mg total) by mouth daily. 45 tablet 3   apixaban (ELIQUIS) 2.5 MG TABS tablet Take 1 tablet (2.5 mg total) by mouth 2 (two) times daily. 60 tablet 11   colchicine 0.6 MG tablet Take 0.6 mg by mouth 2 (two) times daily as needed (onset of gout).     Cyanocobalamin POWD Take by mouth. (Patient not taking: Reported on 01/09/2023)     hydrocortisone cream 1 % Apply topically as needed for itching. 28 g 0   Multiple Vitamins-Minerals (ADULT ONE DAILY GUMMIES PO) Take 2 tablets by mouth in the morning.     atorvastatin (LIPITOR) 80 MG tablet TAKE ONE TABLET BY MOUTH DAILY 30 tablet 5   Coenzyme Q10 (COQ10 PO) Take 1 capsule by mouth in the morning. (Patient not taking: Reported on 01/09/2023)     diazepam (VALIUM) 10 MG tablet Take 10 mg by mouth 3 (three) times daily as needed for anxiety. (Patient not taking: Reported on 01/09/2023)     empagliflozin (JARDIANCE) 10 MG TABS tablet Take 1 tablet (10 mg total) by mouth daily before breakfast. 14 tablet 11   Red Yeast Rice 600 MG CAPS Take 600 mg by mouth daily.     torsemide (DEMADEX) 20 MG tablet Take 1 tablet (20 mg total) by  mouth every other day. 45 tablet 3   umeclidinium-vilanterol (ANORO ELLIPTA) 62.5-25 MCG/ACT AEPB Inhale 1 puff into the lungs daily. (Patient not taking: Reported on 01/09/2023) 60 each 5   methylPREDNISolone (MEDROL DOSEPAK) 4 MG TBPK tablet See admin instructions. (Patient not taking: Reported on 01/08/2023)     No facility-administered medications prior to visit.   Review of Systems  Review of Systems  Constitutional: Negative.   HENT: Negative.    Respiratory:  Negative for cough and wheezing.        Dyspnea     Physical Exam  BP 118/60 (BP Location: Left Arm, Patient Position: Sitting, Cuff Size: Large)   Pulse 68   Temp 97.9 F (36.6 C)  (Oral)   Ht 5\' 5"  (1.651 m)   Wt 247 lb 9.6 oz (112.3 kg)   SpO2 98%   BMI 41.20 kg/m  Physical Exam Constitutional:      Appearance: Normal appearance.  Cardiovascular:     Rate and Rhythm: Normal rate.  Pulmonary:     Effort: Pulmonary effort is normal.  Musculoskeletal:        General: Normal range of motion.  Skin:    General: Skin is warm and dry.  Neurological:     General: No focal deficit present.     Mental Status: She is alert and oriented to person, place, and time. Mental status is at baseline.  Psychiatric:        Mood and Affect: Mood normal.        Behavior: Behavior normal.        Thought Content: Thought content normal.        Judgment: Judgment normal.      Lab Results:  CBC    Component Value Date/Time   WBC 9.1 03/19/2022 1520   RBC 3.64 (L) 03/19/2022 1520   HGB 10.8 (L) 03/19/2022 1520   HCT 33.3 (L) 03/19/2022 1520   PLT 308 03/19/2022 1520   MCV 91.5 03/19/2022 1520   MCH 29.7 03/19/2022 1520   MCHC 32.4 03/19/2022 1520   RDW 13.4 03/19/2022 1520   LYMPHSABS 1.7 02/20/2022 1605   MONOABS 0.7 02/20/2022 1605   EOSABS 0.3 02/20/2022 1605   BASOSABS 0.0 02/20/2022 1605    BMET    Component Value Date/Time   NA 138 04/02/2023 1556   K 4.0 04/02/2023 1556   CL 106 04/02/2023 1556   CO2 23 04/02/2023 1556   GLUCOSE 90 04/02/2023 1556   BUN 32 (H) 04/02/2023 1556   CREATININE 1.91 (H) 04/02/2023 1556   CALCIUM 8.9 04/02/2023 1556   GFRNONAA 26 (L) 04/02/2023 1556   GFRAA 76 (L) 08/24/2013 1222    BNP    Component Value Date/Time   BNP 160.6 (H) 04/02/2023 1556    ProBNP    Component Value Date/Time   PROBNP 2,231.0 (H) 04/08/2012 2223    Imaging: No results found.   Assessment & Plan:   Chronic bronchitis with COPD (chronic obstructive pulmonary disease) - Stable.  Not currently exacerbated.  She has mild dyspnea symptoms with exertion.  She is able to do all the ADLs.  CAT score 7.  Not currently on maintenance  inhaler, Anoro was too expensive.  She was given a sample today and we will check with pharmacy what LABA/LAMA inhaler is formulary on her insurance plan. CT chest in September 2023 showed lungs were clear, no suspicious pulmonary nodules. Mild emphysema and aortic atherosclerosis. Follow-up 6 months with Waynetta Sandy NP   Lanora Manis  Doree Barthel, NP 04/07/2023

## 2023-01-08 NOTE — Telephone Encounter (Signed)
What LABA/LAMA or LAMA is covered on patients insurance? She tells me Anoro was too expensive costing her >900 dollars

## 2023-01-09 ENCOUNTER — Encounter (HOSPITAL_COMMUNITY)
Admission: RE | Admit: 2023-01-09 | Discharge: 2023-01-09 | Disposition: A | Discharge status: Home / Self Care | Payer: Medicare Other | Source: Ambulatory Visit | Attending: Cardiology | Admitting: Cardiology

## 2023-01-09 ENCOUNTER — Telehealth (HOSPITAL_COMMUNITY): Payer: Self-pay | Admitting: *Deleted

## 2023-01-09 VITALS — BP 120/60 | HR 79 | Ht 65.0 in | Wt 249.3 lb

## 2023-01-09 DIAGNOSIS — I213 ST elevation (STEMI) myocardial infarction of unspecified site: Secondary | ICD-10-CM | POA: Insufficient documentation

## 2023-01-09 MED ORDER — DIAZEPAM 10 MG PO TABS: 10.0000 mg | ORAL_TABLET | Freq: Three times a day (TID) | ORAL | 0 refills | Status: AC | PRN

## 2023-01-09 NOTE — Progress Notes (Deleted)
Cardiac Individual Treatment Plan  Patient Details  Name: Brittany Jackson MRN: [id number] Date of Birth: May 15, 1941 Referring Provider:   Flowsheet Row INTENSIVE CARDIAC REHAB ORIENT from 01/09/2023 in [facility]  Referring Provider Dr. Marca Ancona, MD       Initial Encounter Date:  Flowsheet Row INTENSIVE CARDIAC REHAB ORIENT from 01/09/2023 in [facility], [facility]  Date 01/09/23       Visit Diagnosis: 01/08/22 Type 2 STEMI  Patient's Home Medications on Admission:  Current Outpatient Medications:    acetaminophen (TYLENOL) 500 MG tablet, Take 500-1,000 mg by mouth every 6 (six) hours as needed (pain.)., Disp: , Rfl:    albuterol (VENTOLIN HFA) 108 (90 Base) MCG/ACT inhaler, Inhale 2 puffs into the lungs every 6 (six) hours as needed for wheezing or shortness of breath., Disp: 8 g, Rfl: 6   amiodarone (PACERONE) 200 MG tablet, Take 0.5 tablets (100 mg total) by mouth daily., Disp: 45 tablet, Rfl: 3   apixaban (ELIQUIS) 2.5 MG TABS tablet, Take 1 tablet (2.5 mg total) by mouth 2 (two) times daily., Disp: 60 tablet, Rfl: 11   atorvastatin (LIPITOR) 80 MG tablet, TAKE ONE TABLET BY MOUTH DAILY, Disp: 30 tablet, Rfl: 5   empagliflozin (JARDIANCE) 10 MG TABS tablet, Take 1 tablet (10 mg total) by mouth daily before breakfast., Disp: 14 tablet, Rfl: 11   hydrocortisone cream 1 %, Apply topically as needed for itching., Disp: 28 g, Rfl: 0   Multiple Vitamins-Minerals (ADULT ONE DAILY GUMMIES PO), Take 1 tablet by mouth in the morning., Disp: , Rfl:    torsemide (DEMADEX) 20 MG tablet, Take 1 tablet (20 mg total) by mouth every other day., Disp: 45 tablet, Rfl: 3   Coenzyme Q10 (COQ10 PO), Take 1 capsule by mouth in the morning. (Patient not taking: Reported on 01/09/2023), Disp: , Rfl:    colchicine 0.6 MG tablet, Take 0.6 mg by mouth 2 (two) times daily as needed (onset of gout).  (Patient not taking: Reported on 01/09/2023), Disp: , Rfl:    Cyanocobalamin POWD, Take by mouth. (Patient not taking: Reported on 01/09/2023), Disp: , Rfl:    diazepam (VALIUM) 10 MG tablet, Take 10 mg by mouth 3 (three) times daily as needed for anxiety. (Patient not taking: Reported on 01/09/2023), Disp: , Rfl:    methylPREDNISolone (MEDROL DOSEPAK) 4 MG TBPK tablet, See admin instructions. (Patient not taking: Reported on 01/08/2023), Disp: , Rfl:    Red Yeast Rice 600 MG CAPS, Take 600 mg by mouth daily. (Patient not taking: Reported on 01/09/2023), Disp: , Rfl:    Tiotropium Bromide-Olodaterol (STIOLTO RESPIMAT) 2.5-2.5 MCG/ACT AERS, Inhale 2 puffs into the lungs daily., Disp: 1 each, Rfl: 0   umeclidinium-vilanterol (ANORO ELLIPTA) 62.5-25 MCG/ACT AEPB, Inhale 1 puff into the lungs daily. (Patient not taking: Reported on 01/09/2023), Disp: 60 each, Rfl: 5  Past Medical History: Past Medical History:  Diagnosis Date   CHF (congestive heart failure) (HCC)    Hypertension    Urticaria     Tobacco Use: Social History   Tobacco Use  Smoking Status Former   Packs/day: 1.00   Years: 60.00   Additional pack years: 0.00   Total pack years: 60.00   Types: Cigarettes   Start date: 59   Quit date: 01/08/2022   Years since quitting: 1.0  Smokeless Tobacco Never    Labs: Review Flowsheet  More data exists  Latest Ref Rng & Units 01/20/2022 01/21/2022 01/22/2022 03/19/2022 12/27/2022  Labs for ITP Cardiac and Pulmonary Rehab  Cholestrol 0 - 200 mg/dL - - - 914  782   LDL (calc) 0 - 99 mg/dL - - - 71  63   HDL-C >95 mg/dL - - - 56  74   Trlycerides <150 mg/dL - - - 621  308   Hemoglobin A1c 4.8 - 5.6 % - - - - 5.7   O2 Saturation % 78.5  87.6  58  47.9  - -    Capillary Blood Glucose: Lab Results  Component Value Date   GLUCAP 105 (H) 01/22/2022   GLUCAP 80 01/22/2022   GLUCAP 83 01/22/2022   GLUCAP 93 01/21/2022   GLUCAP 86 01/21/2022     Exercise Target Goals: Exercise  Program Goal: Individual exercise prescription set using results from initial 6 min walk test and THRR while considering  patient's activity barriers and safety.   Exercise Prescription Goal: Initial exercise prescription builds to 30-45 minutes a day of aerobic activity, 2-3 days per week.  Home exercise guidelines will be given to patient during program as part of exercise prescription that the participant will acknowledge.  Activity Barriers & Risk Stratification:  Activity Barriers & Cardiac Risk Stratification - 01/09/23 1159       Activity Barriers & Cardiac Risk Stratification   Activity Barriers Joint Problems;Deconditioning;History of Falls;Balance Concerns;Muscular Weakness;Decreased Ventricular Function    Cardiac Risk Stratification High             6 Minute Walk:  6 Minute Walk     Row Name 09/26/22 1555 01/09/23 1156       6 Minute Walk   Phase Initial Initial    Distance 960 feet 840 feet    Walk Time 6 minutes 6 minutes    # of Rest Breaks 1  2:27-3:50 SOB, SpO2 100% 2  Both standing rest breaks due to hip pain and fatigue 3.24-3.44 and 1.07-1.28    MPH 1.8 1.59    METS 1.2 0.69    RPE 13 12    Perceived Dyspnea  1 0    VO2 Peak 4.22 2.41    Symptoms Yes (comment) Yes (comment)    Comments SOB, resolved with rest. Chronic bilateral hip pain, left toe gout pain, and fatigue 3/10 pain    Resting HR 69 bpm 79 bpm    Resting BP 120/54 120/60    Resting Oxygen Saturation  96 % 98 %    Exercise Oxygen Saturation  during 6 min walk 97 % 99 %    Max Ex. HR 110 bpm 106 bpm    Max Ex. BP 168/52 150/74    2 Minute Post BP 124/52 134/64             Oxygen Initial Assessment:   Oxygen Re-Evaluation:   Oxygen Discharge (Final Oxygen Re-Evaluation):   Initial Exercise Prescription:  Initial Exercise Prescription - 01/09/23 1200       Date of Initial Exercise RX and Referring Provider   Date 01/09/23    Referring Provider Dr. Marca Ancona, MD     Expected Discharge Date 03/21/23      NuStep   Level 1    SPM 80    Minutes 20    METs 1.7      Prescription Details   Frequency (times per week) 3    Duration Progress to 30 minutes of continuous aerobic without signs/symptoms of physical distress  Intensity   THRR 40-80% of Max Heartrate 54-109    Ratings of Perceived Exertion 11-13    Perceived Dyspnea 0-4      Progression   Progression Continue to progress workloads to maintain intensity without signs/symptoms of physical distress.      Resistance Training   Training Prescription Yes    Weight 2    Reps 10-15             Perform Capillary Blood Glucose checks as needed.  Exercise Prescription Changes:   Exercise Prescription Changes     Row Name 10/02/22 1615             Response to Exercise   Blood Pressure (Admit) 120/70       Blood Pressure (Exercise) 144/68       Blood Pressure (Exit) 108/80       Heart Rate (Admit) 69 bpm       Heart Rate (Exercise) 109 bpm       Heart Rate (Exit) 75 bpm       Rating of Perceived Exertion (Exercise) 13       Symptoms none       Comments Pt first day in teh [facility] ICR program       Duration Progress to 30 minutes of  aerobic without signs/symptoms of physical distress       Intensity THRR unchanged         Progression   Progression Continue to progress workloads to maintain intensity without signs/symptoms of physical distress.       Average METs 2.3         Resistance Training   Training Prescription No         NuStep   Level 2       SPM 95       Minutes 25       METs 2.3                Exercise Comments:   Exercise Comments     Row Name 10/02/22 1620 11/01/22 0851         Exercise Comments Pt first day in the CRP2 program, Pt tolerated exercise well with an average MET level of 2.3. Pt is learning her THRR, RPE and ExRx Pt has been absent. Will review goals and education upon her return               Exercise Goals and  Review:   Exercise Goals     Row Name 09/26/22 1600 01/09/23 1202           Exercise Goals   Increase Physical Activity Yes Yes      Intervention Provide advice, education, support and counseling about physical activity/exercise needs.;Develop an individualized exercise prescription for aerobic and resistive training based on initial evaluation findings, risk stratification, comorbidities and participant's personal goals. Provide advice, education, support and counseling about physical activity/exercise needs.;Develop an individualized exercise prescription for aerobic and resistive training based on initial evaluation findings, risk stratification, comorbidities and participant's personal goals.      Expected Outcomes Short Term: Attend rehab on a regular basis to increase amount of physical activity.;Long Term: Exercising regularly at least 3-5 days a week.;Long Term: Add in home exercise to make exercise part of routine and to increase amount of physical activity. Short Term: Attend rehab on a regular basis to increase amount of physical activity.;Long Term: Exercising regularly at least 3-5 days a week.;Long Term: Add in home exercise to  make exercise part of routine and to increase amount of physical activity.      Increase Strength and Stamina Yes Yes      Intervention Provide advice, education, support and counseling about physical activity/exercise needs.;Develop an individualized exercise prescription for aerobic and resistive training based on initial evaluation findings, risk stratification, comorbidities and participant's personal goals. Provide advice, education, support and counseling about physical activity/exercise needs.;Develop an individualized exercise prescription for aerobic and resistive training based on initial evaluation findings, risk stratification, comorbidities and participant's personal goals.      Expected Outcomes Short Term: Increase workloads from initial exercise  prescription for resistance, speed, and METs.;Short Term: Perform resistance training exercises routinely during rehab and add in resistance training at home;Long Term: Improve cardiorespiratory fitness, muscular endurance and strength as measured by increased METs and functional capacity ( ) Short Term: Increase workloads from initial exercise prescription for resistance, speed, and METs.;Short Term: Perform resistance training exercises routinely during rehab and add in resistance training at home;Long Term: Improve cardiorespiratory fitness, muscular endurance and strength as measured by increased METs and functional capacity ( )      Able to understand and use rate of perceived exertion (RPE) scale Yes Yes      Intervention Provide education and explanation on how to use RPE scale Provide education and explanation on how to use RPE scale      Expected Outcomes Short Term: Able to use RPE daily in rehab to express subjective intensity level;Long Term:  Able to use RPE to guide intensity level when exercising independently Short Term: Able to use RPE daily in rehab to express subjective intensity level;Long Term:  Able to use RPE to guide intensity level when exercising independently      Able to understand and use Dyspnea scale Yes --      Intervention Provide education and explanation on how to use Dyspnea scale --      Expected Outcomes Short Term: Able to use Dyspnea scale daily in rehab to express subjective sense of shortness of breath during exertion;Long Term: Able to use Dyspnea scale to guide intensity level when exercising independently --      Knowledge and understanding of Target Heart Rate Range (THRR) Yes Yes      Intervention Provide education and explanation of THRR including how the numbers were predicted and where they are located for reference Provide education and explanation of THRR including how the numbers were predicted and where they are located for reference      Expected  Outcomes Short Term: Able to state/look up THRR;Long Term: Able to use THRR to govern intensity when exercising independently;Short Term: Able to use daily as guideline for intensity in rehab Short Term: Able to state/look up THRR;Long Term: Able to use THRR to govern intensity when exercising independently;Short Term: Able to use daily as guideline for intensity in rehab      Understanding of Exercise Prescription Yes Yes      Intervention Provide education, explanation, and written materials on patient's individual exercise prescription Provide education, explanation, and written materials on patient's individual exercise prescription      Expected Outcomes Short Term: Able to explain program exercise prescription;Long Term: Able to explain home exercise prescription to exercise independently Short Term: Able to explain program exercise prescription;Long Term: Able to explain home exercise prescription to exercise independently               Exercise Goals Re-Evaluation :  Exercise Goals Re-Evaluation  Row Name 10/02/22 1618             Exercise Goal Re-Evaluation   Exercise Goals Review Increase Physical Activity;Understanding of Exercise Prescription;Increase Strength and Stamina;Knowledge and understanding of Target Heart Rate Range (THRR);Able to understand and use rate of perceived exertion (RPE) scale       Comments Pt first day in the CRP2 program, Pt tolerated exercise well with an average MET level of 2.3. Pt is learning her THRR, RPE and ExRx       Expected Outcomes Will continue to monitor pt and progress workloads as tolerated without sign or symptom.                Discharge Exercise Prescription (Final Exercise Prescription Changes):  Exercise Prescription Changes - 10/02/22 1615       Response to Exercise   Blood Pressure (Admit) 120/70    Blood Pressure (Exercise) 144/68    Blood Pressure (Exit) 108/80    Heart Rate (Admit) 69 bpm    Heart Rate (Exercise)  109 bpm    Heart Rate (Exit) 75 bpm    Rating of Perceived Exertion (Exercise) 13    Symptoms none    Comments Pt first day in teh [facility] ICR program    Duration Progress to 30 minutes of  aerobic without signs/symptoms of physical distress    Intensity THRR unchanged      Progression   Progression Continue to progress workloads to maintain intensity without signs/symptoms of physical distress.    Average METs 2.3      Resistance Training   Training Prescription No      NuStep   Level 2    SPM 95    Minutes 25    METs 2.3             Nutrition:  Target Goals: Understanding of nutrition guidelines, daily intake of sodium 1500mg , cholesterol 200mg , calories 30% from fat and 7% or less from saturated fats, daily to have 5 or more servings of fruits and vegetables.  Biometrics:  Pre Biometrics - 01/09/23 1202       Pre Biometrics   Height 5\' 5"  (1.651 m)    Weight 113.1 kg    Waist Circumference 51.5 inches    Hip Circumference 57.5 inches    Waist to Hip Ratio 0.9 %    BMI (Calculated) 41.49    Triceps Skinfold 35 mm    Grip Strength 27 kg    Flexibility --   Did not attempt due to pain   Single Leg Stand 1.2 seconds              Nutrition Therapy Plan and Nutrition Goals:  Nutrition Therapy & Goals - 11/04/22 0912       Nutrition Therapy   Diet Heart healthy diet    Drug/Food Interactions Statins/Certain Fruits      Personal Nutrition Goals   Nutrition Goal Patient to identify strategies for reducing cardiovascular risk by attending the weekly [facility] education and nutrition series    Personal Goal #2 Patient to improve diet quality by using the plate method as a daily guide for meal planning to include lean protein/plant protein, fruits, vegetables, whole grains, nonfat dairy as part of well balanced diet    Personal Goal #3 Patient to reduce sodium intake to 1500mg  per day    Comments Patient has not attended cardiac rehab since 10/09/22. Goals  have not been reassessed due to attendance. Patient will continue to benefit  from participation in intensive cardiac rehab for nutrition, exercise, and lifestyle modfications as able.      Intervention Plan   Intervention Prescribe, educate and counsel regarding individualized specific dietary modifications aiming towards targeted core components such as weight, hypertension, lipid management, diabetes, heart failure and other comorbidities.;Nutrition handout(s) given to patient.    Expected Outcomes Short Term Goal: Understand basic principles of dietary content, such as calories, fat, sodium, cholesterol and nutrients.;Long Term Goal: Adherence to prescribed nutrition plan.             Nutrition Assessments:  MEDIFICTS Score Key: ?70 Need to make dietary changes  40-70 Heart Healthy Diet ? 40 Therapeutic Level Cholesterol Diet    Picture Your Plate Scores: <40 Unhealthy dietary pattern with much room for improvement. 41-50 Dietary pattern unlikely to meet recommendations for good health and room for improvement. 51-60 More healthful dietary pattern, with some room for improvement.  >60 Healthy dietary pattern, although there may be some specific behaviors that could be improved.    Nutrition Goals Re-Evaluation:  Nutrition Goals Re-Evaluation     Row Name 10/03/22 0853 11/04/22 0912           Goals   Current Weight 245 lb 9.5 oz (111.4 kg) --      Comment lipids WNL, Cr 1.99, GFR 24 No new labs; most recent labs  lipids WNL, Cr 1.99, GFR 24      Expected Outcome Sandria follows with outside nephrology; not able to view notes/labs. She continues regular follow-up with the heart failure clinic. Lilinoe will continue to benefit from particiation in intensive cardiac rehab for nutrition, exercise, and lifestyle modification. Patient has not attended cardiac rehab since 10/09/22. Goals have not been reassessed due to attendance. Patient will continue to benefit from participation in  intensive cardiac rehab for nutrition, exercise, and lifestyle modfications as able.               Nutrition Goals Re-Evaluation:  Nutrition Goals Re-Evaluation     Row Name 10/03/22 0853 11/04/22 0912           Goals   Current Weight 245 lb 9.5 oz (111.4 kg) --      Comment lipids WNL, Cr 1.99, GFR 24 No new labs; most recent labs  lipids WNL, Cr 1.99, GFR 24      Expected Outcome Keonia follows with outside nephrology; not able to view notes/labs. She continues regular follow-up with the heart failure clinic. Sakari will continue to benefit from particiation in intensive cardiac rehab for nutrition, exercise, and lifestyle modification. Patient has not attended cardiac rehab since 10/09/22. Goals have not been reassessed due to attendance. Patient will continue to benefit from participation in intensive cardiac rehab for nutrition, exercise, and lifestyle modfications as able.               Nutrition Goals Discharge (Final Nutrition Goals Re-Evaluation):  Nutrition Goals Re-Evaluation - 11/04/22 0912       Goals   Comment No new labs; most recent labs  lipids WNL, Cr 1.99, GFR 24    Expected Outcome Patient has not attended cardiac rehab since 10/09/22. Goals have not been reassessed due to attendance. Patient will continue to benefit from participation in intensive cardiac rehab for nutrition, exercise, and lifestyle modfications as able.             Psychosocial: Target Goals: Acknowledge presence or absence of significant depression and/or stress, maximize coping skills, provide positive support system. Participant is  able to verbalize types and ability to use techniques and skills needed for reducing stress and depression.  Initial Review & Psychosocial Screening:  Initial Psych Review & Screening - 01/09/23 1009       Initial Review   Current issues with Current Depression;Current Stress Concerns;History of Depression    Source of Stress Concerns Chronic  Illness;Financial    Comments Pt states she has a hard time with her finances and her heart condition. Both of these issues are stresses for her, but she has friends and family who help. She has denighed counclining at this time      Family Dynamics   Good Support System? Yes    Comments friends and family      Barriers   Psychosocial barriers to participate in program The patient should benefit from training in stress management and relaxation.      Screening Interventions   Interventions Encouraged to exercise;To provide support and resources with identified psychosocial needs;Provide feedback about the scores to participant    Expected Outcomes Short Term goal: Identification and review with participant of any Quality of Life or Depression concerns found by scoring the questionnaire.   No counciling wanted            Quality of Life Scores:  Quality of Life - 01/09/23 1206       Quality of Life   Select Quality of Life      Quality of Life Scores   Health/Function Pre 19.85 %    Socioeconomic Pre 27.83 %    Psych/Spiritual Pre 27.43 %    Family Pre 12 %    GLOBAL Pre 22.52 %            Scores of 19 and below usually indicate a poorer quality of life in these areas.  A difference of  2-3 points is a clinically meaningful difference.  A difference of 2-3 points in the total score of the Quality of Life Index has been associated with significant improvement in overall quality of life, self-image, physical symptoms, and general health in studies assessing change in quality of life.  PHQ-9: Review Flowsheet       01/09/2023 09/26/2022  Depression screen PHQ 2/9  Decreased Interest 2 0  Down, Depressed, Hopeless 1 1  PHQ - 2 Score 3 1  Altered sleeping 0 0  Tired, decreased energy 2 2  Change in appetite 2 3  Feeling bad or failure about yourself  1 2  Trouble concentrating 0 0  Moving slowly or fidgety/restless 0 1  Suicidal thoughts 0 0  PHQ-9 Score 8 9   Difficult doing work/chores Not difficult at all Not difficult at all   Interpretation of Total Score  Total Score Depression Severity:  1-4 = Minimal depression, 5-9 = Mild depression, 10-14 = Moderate depression, 15-19 = Moderately severe depression, 20-27 = Severe depression   Psychosocial Evaluation and Intervention:   Psychosocial Re-Evaluation:  Psychosocial Re-Evaluation     Row Name 10/02/22 1544 10/15/22 1114           Psychosocial Re-Evaluation   Current issues with Current Abuse or Neglect to Report;Current Anxiety/Panic;History of Depression Current Abuse or Neglect to Report;Current Anxiety/Panic;History of Depression      Comments Kenzlee denies being depressed on her first day of exercise but admits to having a history of depression Gyda has not voiced any increased concerns or stressors at intensive cardiac rehab      Expected Outcomes Shontaye will have decreased  or controlled depression and anxiety upon completion of her first day of exercise Aslean will have decreased or controlled depression and anxiety upon completion of her first day of exercise      Interventions Stress management education;Relaxation education;Encouraged to attend Cardiac Rehabilitation for the exercise Stress management education;Relaxation education;Encouraged to attend Cardiac Rehabilitation for the exercise      Continue Psychosocial Services  No Follow up required No Follow up required               Psychosocial Discharge (Final Psychosocial Re-Evaluation):  Psychosocial Re-Evaluation - 10/15/22 1114       Psychosocial Re-Evaluation   Current issues with Current Abuse or Neglect to Report;Current Anxiety/Panic;History of Depression    Comments Saadia has not voiced any increased concerns or stressors at intensive cardiac rehab    Expected Outcomes Kayloni will have decreased or controlled depression and anxiety upon completion of her first day of exercise    Interventions Stress management  education;Relaxation education;Encouraged to attend Cardiac Rehabilitation for the exercise    Continue Psychosocial Services  No Follow up required             Vocational Rehabilitation: Provide vocational rehab assistance to qualifying candidates.   Vocational Rehab Evaluation & Intervention:  Vocational Rehab - 01/09/23 1210       Initial Vocational Rehab Evaluation & Intervention   Assessment shows need for Vocational Rehabilitation No   Patient is retired     Art therapist   Comments --             Education: Education Goals: Education classes will be provided on a weekly basis, covering required topics. Participant will state understanding/return demonstration of topics presented.    Education     Row Name 10/02/22 1600     Education   Cardiac Education Topics [facility]   Customer service manager   Weekly Topic Comforting Weekend Breakfasts   Instruction Review Code 1- Verbalizes Understanding   Class Start Time 1400   Class Stop Time 1445   Class Time Calculation (min) 45 min    Row Name 10/07/22 1600     Education   Cardiac Education Topics [facility]   Select Core Videos     Core Videos   Educator Exercise Physiologist   Select Exercise Education   Exercise Education Biomechanial Limitations   Instruction Review Code 1- Verbalizes Understanding   Class Start Time 1406   Class Stop Time 1442   Class Time Calculation (min) 36 min    Row Name 10/09/22 1600     Education   Cardiac Education Topics Pritikin   Customer service manager   Weekly Topic Fast Evening Meals   Instruction Review Code 1- Verbalizes Understanding   Class Start Time 1400   Class Stop Time 1445   Class Time Calculation (min) 45 min            Core Videos: Exercise    Move It!  Clinical staff conducted group or individual video education with verbal and written material  and guidebook.  Patient learns the recommended Pritikin exercise program. Exercise with the goal of living a long, healthy life. Some of the health benefits of exercise include controlled diabetes, healthier blood pressure levels, improved cholesterol levels, improved heart and lung capacity, improved sleep, and better body composition. Everyone should speak with their doctor before starting or changing  an exercise routine.  Biomechanical Limitations Clinical staff conducted group or individual video education with verbal and written material and guidebook.  Patient learns how biomechanical limitations can impact exercise and how we can mitigate and possibly overcome limitations to have an impactful and balanced exercise routine.  Body Composition Clinical staff conducted group or individual video education with verbal and written material and guidebook.  Patient learns that body composition (ratio of muscle mass to fat mass) is a key component to assessing overall fitness, rather than body weight alone. Increased fat mass, especially visceral belly fat, can put [place] at increased risk for metabolic syndrome, type 2 diabetes, heart disease, and even death. It is recommended to combine diet and exercise (cardiovascular and resistance training) to improve your body composition. Seek guidance from your physician and exercise physiologist before implementing an exercise routine.  Exercise Action Plan Clinical staff conducted group or individual video education with verbal and written material and guidebook.  Patient learns the recommended strategies to achieve and enjoy long-term exercise adherence, including variety, self-motivation, self-efficacy, and positive decision making. Benefits of exercise include fitness, good health, weight management, more energy, better sleep, less stress, and overall well-being.  Medical   Heart Disease Risk Reduction Clinical staff conducted group or individual video  education with verbal and written material and guidebook.  Patient learns our heart is our most vital organ as it circulates oxygen, nutrients, white blood cells, and hormones throughout the entire body, and carries waste away. Data supports a plant-based eating plan like the Pritikin Program for its effectiveness in slowing progression of and reversing heart disease. The video provides a number of recommendations to address heart disease.   Metabolic Syndrome and Belly Fat  Clinical staff conducted group or individual video education with verbal and written material and guidebook.  Patient learns what metabolic syndrome is, how it leads to heart disease, and how one can reverse it and keep it from coming back. You have metabolic syndrome if you have 3 of the following 5 criteria: abdominal obesity, high blood pressure, high triglycerides, low HDL cholesterol, and high blood sugar.  Hypertension and Heart Disease Clinical staff conducted group or individual video education with verbal and written material and guidebook.  Patient learns that high blood pressure, or hypertension, is very common in the [place]. Hypertension is largely due to excessive salt intake, but other important risk factors include being overweight, physical inactivity, drinking too much alcohol, smoking, and not eating enough potassium from fruits and vegetables. High blood pressure is a leading risk factor for heart attack, stroke, congestive heart failure, dementia, kidney failure, and premature death. Long-term effects of excessive salt intake include stiffening of the arteries and thickening of heart muscle and organ damage. Recommendations include ways to reduce hypertension and the risk of heart disease.  Diseases of Our Time - Focusing on Diabetes Clinical staff conducted group or individual video education with verbal and written material and guidebook.  Patient learns why the best way to stop diseases of our time is  prevention, through food and other lifestyle changes. Medicine (such as prescription pills and surgeries) is often only a Band-Aid on the problem, not a long-term solution. Most common diseases of our time include obesity, type 2 diabetes, hypertension, heart disease, and cancer. The Pritikin Program is recommended and has been proven to help reduce, reverse, and/or prevent the damaging effects of metabolic syndrome.  Nutrition   Overview of the Pritikin Eating Plan  Clinical staff conducted group or  individual video education with verbal and written material and guidebook.  Patient learns about the Pritikin Eating Plan for disease risk reduction. The Pritikin Eating Plan emphasizes a wide variety of unrefined, minimally-processed carbohydrates, like fruits, vegetables, whole grains, and legumes. Go, Caution, and Stop food choices are explained. Plant-based and lean animal proteins are emphasized. Rationale provided for low sodium intake for blood pressure control, low added sugars for blood sugar stabilization, and low added fats and oils for coronary artery disease risk reduction and weight management.  Calorie Density  Clinical staff conducted group or individual video education with verbal and written material and guidebook.  Patient learns about calorie density and how it impacts the Pritikin Eating Plan. Knowing the characteristics of the food you choose will help you decide whether those foods will lead to weight gain or weight loss, and whether you want to consume more or less of them. Weight loss is usually a side effect of the Pritikin Eating Plan because of its focus on low calorie-dense foods.  Label Reading  Clinical staff conducted group or individual video education with verbal and written material and guidebook.  Patient learns about the Pritikin recommended label reading guidelines and corresponding recommendations regarding calorie density, added sugars, sodium content, and whole  grains.  Dining Out - Part 1  Clinical staff conducted group or individual video education with verbal and written material and guidebook.  Patient learns that restaurant meals can be sabotaging because they can be so high in calories, fat, sodium, and/or sugar. Patient learns recommended strategies on how to positively address this and avoid unhealthy pitfalls.  Facts on Fats  Clinical staff conducted group or individual video education with verbal and written material and guidebook.  Patient learns that lifestyle modifications can be just as effective, if not more so, as many medications for lowering your risk of heart disease. A Pritikin lifestyle can help to reduce your risk of inflammation and atherosclerosis (cholesterol build-up, or plaque, in the artery walls). Lifestyle interventions such as dietary choices and physical activity address the cause of atherosclerosis. A review of the types of fats and their impact on blood cholesterol levels, along with dietary recommendations to reduce fat intake is also included.  Nutrition Action Plan  Clinical staff conducted group or individual video education with verbal and written material and guidebook.  Patient learns how to incorporate Pritikin recommendations into their lifestyle. Recommendations include planning and keeping personal health goals in mind as an important part of their success.  Healthy Mind-Set    Healthy Minds, Bodies, Hearts  Clinical staff conducted group or individual video education with verbal and written material and guidebook.  Patient learns how to identify when they are stressed. Video will discuss the impact of that stress, as well as the many benefits of stress management. Patient will also be introduced to stress management techniques. The way we think, act, and feel has an impact on our hearts.  How Our Thoughts Can Heal Our Hearts  Clinical staff conducted group or individual video education with verbal and  written material and guidebook.  Patient learns that negative thoughts can cause depression and anxiety. This can result in negative lifestyle behavior and serious health problems. Cognitive behavioral therapy is an effective method to help control our thoughts in order to change and improve our emotional outlook.  Additional Videos:  Exercise    Improving Performance  Clinical staff conducted group or individual video education with verbal and written material and guidebook.  Patient learns  to use a non-linear approach by alternating intensity levels and lengths of time spent exercising to help burn more calories and lose more body fat. Cardiovascular exercise helps improve heart health, metabolism, hormonal balance, blood sugar control, and recovery from fatigue. Resistance training improves strength, endurance, balance, coordination, reaction time, metabolism, and muscle mass. Flexibility exercise improves circulation, posture, and balance. Seek guidance from your physician and exercise physiologist before implementing an exercise routine and learn your capabilities and proper form for all exercise.  Introduction to Yoga  Clinical staff conducted group or individual video education with verbal and written material and guidebook.  Patient learns about yoga, a discipline of the coming together of mind, breath, and body. The benefits of yoga include improved flexibility, improved range of motion, better posture and core strength, increased lung function, weight loss, and positive self-image. Yoga's heart health benefits include lowered blood pressure, healthier heart rate, decreased cholesterol and triglyceride levels, improved immune function, and reduced stress. Seek guidance from your physician and exercise physiologist before implementing an exercise routine and learn your capabilities and proper form for all exercise.  Medical   Aging: Enhancing Your Quality of Life  Clinical staff conducted  group or individual video education with verbal and written material and guidebook.  Patient learns key strategies and recommendations to stay in good physical health and enhance quality of life, such as prevention strategies, having an advocate, securing a Health Care Proxy and Power of Attorney, and keeping a list of medications and system for tracking them. It also discusses how to avoid risk for bone loss.  Biology of Weight Control  Clinical staff conducted group or individual video education with verbal and written material and guidebook.  Patient learns that weight gain occurs because we consume more calories than we burn (eating more, moving less). Even if your body weight is normal, you may have higher ratios of fat compared to muscle mass. Too much body fat puts you at increased risk for cardiovascular disease, heart attack, stroke, type 2 diabetes, and obesity-related cancers. In addition to exercise, following the Pritikin Eating Plan can help reduce your risk.  Decoding Lab Results  Clinical staff conducted group or individual video education with verbal and written material and guidebook.  Patient learns that lab test reflects one measurement whose values change over time and are influenced by many factors, including medication, stress, sleep, exercise, food, hydration, pre-existing medical conditions, and more. It is recommended to use the knowledge from this video to become more involved with your lab results and evaluate your numbers to speak with your doctor.   Diseases of Our Time - Overview  Clinical staff conducted group or individual video education with verbal and written material and guidebook.  Patient learns that according to the CDC, 50% to 70% of chronic diseases (such as obesity, type 2 diabetes, elevated lipids, hypertension, and heart disease) are avoidable through lifestyle improvements including healthier food choices, listening to satiety cues, and increased physical  activity.  Sleep Disorders Clinical staff conducted group or individual video education with verbal and written material and guidebook.  Patient learns how good quality and duration of sleep are important to overall health and well-being. Patient also learns about sleep disorders and how they impact health along with recommendations to address them, including discussing with a physician.  Nutrition  Dining Out - Part 2 Clinical staff conducted group or individual video education with verbal and written material and guidebook.  Patient learns how to plan ahead and communicate  in order to maximize their dining experience in a healthy and nutritious manner. Included are recommended food choices based on the type of restaurant the patient is visiting.   Fueling a Banker conducted group or individual video education with verbal and written material and guidebook.  There is a strong connection between our food choices and our health. Diseases like obesity and type 2 diabetes are very prevalent and are in large-part due to lifestyle choices. The Pritikin Eating Plan provides plenty of food and hunger-curbing satisfaction. It is easy to follow, affordable, and helps reduce health risks.  Menu Workshop  Clinical staff conducted group or individual video education with verbal and written material and guidebook.  Patient learns that restaurant meals can sabotage health goals because they are often packed with calories, fat, sodium, and sugar. Recommendations include strategies to plan ahead and to communicate with the manager, chef, or server to help order a healthier meal.  Planning Your Eating Strategy  Clinical staff conducted group or individual video education with verbal and written material and guidebook.  Patient learns about the Pritikin Eating Plan and its benefit of reducing the risk of disease. The Pritikin Eating Plan does not focus on calories. Instead, it emphasizes  high-quality, nutrient-rich foods. By knowing the characteristics of the foods, we choose, we can determine their calorie density and make informed decisions.  Targeting Your Nutrition Priorities  Clinical staff conducted group or individual video education with verbal and written material and guidebook.  Patient learns that lifestyle habits have a tremendous impact on disease risk and progression. This video provides eating and physical activity recommendations based on your personal health goals, such as reducing LDL cholesterol, losing weight, preventing or controlling type 2 diabetes, and reducing high blood pressure.  Vitamins and Minerals  Clinical staff conducted group or individual video education with verbal and written material and guidebook.  Patient learns different ways to obtain key vitamins and minerals, including through a recommended healthy diet. It is important to discuss all supplements you take with your doctor.   Healthy Mind-Set    Smoking Cessation  Clinical staff conducted group or individual video education with verbal and written material and guidebook.  Patient learns that cigarette smoking and tobacco addiction pose a serious health risk which affects millions of people. Stopping smoking will significantly reduce the risk of heart disease, lung disease, and many forms of cancer. Recommended strategies for quitting are covered, including working with your doctor to develop a successful plan.  Culinary   Becoming a Set designer conducted group or individual video education with verbal and written material and guidebook.  Patient learns that cooking at home can be healthy, cost-effective, quick, and puts them in control. Keys to cooking healthy recipes will include looking at your recipe, assessing your equipment needs, planning ahead, making it simple, choosing cost-effective seasonal ingredients, and limiting the use of added fats, salts, and  sugars.  Cooking - Breakfast and Snacks  Clinical staff conducted group or individual video education with verbal and written material and guidebook.  Patient learns how important breakfast is to satiety and nutrition through the entire day. Recommendations include key foods to eat during breakfast to help stabilize blood sugar levels and to prevent overeating at meals later in the day. Planning ahead is also a key component.  Cooking - Educational psychologist conducted group or individual video education with verbal and written material and guidebook.  Patient learns  eating strategies to improve overall health, including an approach to cook more at home. Recommendations include thinking of animal protein as a side on your plate rather than center stage and focusing instead on lower calorie dense options like vegetables, fruits, whole grains, and plant-based proteins, such as beans. Making sauces in large quantities to freeze for later and leaving the skin on your vegetables are also recommended to maximize your experience.  Cooking - Healthy Salads and Dressing Clinical staff conducted group or individual video education with verbal and written material and guidebook.  Patient learns that vegetables, fruits, whole grains, and legumes are the foundations of the Pritikin Eating Plan. Recommendations include how to incorporate each of these in flavorful and healthy salads, and how to create homemade salad dressings. Proper handling of ingredients is also covered. Cooking - Soups and [employer] - Soups and Desserts Clinical staff conducted group or individual video education with verbal and written material and guidebook.  Patient learns that Pritikin soups and desserts make for easy, nutritious, and delicious snacks and meal components that are low in sodium, fat, sugar, and calorie density, while high in vitamins, minerals, and filling fiber. Recommendations include simple and healthy  ideas for soups and desserts.   Overview     The Pritikin Solution Program Overview Clinical staff conducted group or individual video education with verbal and written material and guidebook.  Patient learns that the results of the Pritikin Program have been documented in more than 100 articles published in peer-reviewed journals, and the benefits include reducing risk factors for (and, in some cases, even reversing) high cholesterol, high blood pressure, type 2 diabetes, obesity, and more! An overview of the three key pillars of the Pritikin Program will be covered: eating well, doing regular exercise, and having a healthy mind-set.  WORKSHOPS  Exercise: Exercise Basics: Building Your Action Plan Clinical staff led group instruction and group discussion with PowerPoint presentation and patient guidebook. To enhance the learning environment the use of posters, models and videos may be added. At the conclusion of this workshop, patients will comprehend the difference between physical activity and exercise, as well as the benefits of incorporating both, into their routine. Patients will understand the FITT (Frequency, Intensity, Time, and Type) principle and how to use it to build an exercise action plan. In addition, safety concerns and other considerations for exercise and cardiac rehab will be addressed by the presenter. The purpose of this lesson is to promote a comprehensive and effective weekly exercise routine in order to improve patients' overall level of fitness.   Managing Heart Disease: Your Path to a Healthier Heart Clinical staff led group instruction and group discussion with PowerPoint presentation and patient guidebook. To enhance the learning environment the use of posters, models and videos may be added.At the conclusion of this workshop, patients will understand the anatomy and physiology of the heart. Additionally, they will understand how Pritikin's three pillars impact the  risk factors, the progression, and the management of heart disease.  The purpose of this lesson is to provide a high-level overview of the heart, heart disease, and how the Pritikin lifestyle positively impacts risk factors.  Exercise Biomechanics Clinical staff led group instruction and group discussion with PowerPoint presentation and patient guidebook. To enhance the learning environment the use of posters, models and videos may be added. Patients will learn how the structural parts of their bodies function and how these functions impact their daily activities, movement, and exercise. Patients will learn how  to promote a neutral spine, learn how to manage pain, and identify ways to improve their physical movement in order to promote healthy living. The purpose of this lesson is to expose patients to common physical limitations that impact physical activity. Participants will learn practical ways to adapt and manage aches and pains, and to minimize their effect on regular exercise. Patients will learn how to maintain good posture while sitting, walking, and lifting.  Balance Training and Fall Prevention  Clinical staff led group instruction and group discussion with PowerPoint presentation and patient guidebook. To enhance the learning environment the use of posters, models and videos may be added. At the conclusion of this workshop, patients will understand the importance of their sensorimotor skills (vision, proprioception, and the vestibular system) in maintaining their ability to balance as they age. Patients will apply a variety of balancing exercises that are appropriate for their current level of function. Patients will understand the common causes for poor balance, possible solutions to these problems, and ways to modify their physical environment in order to minimize their fall risk. The purpose of this lesson is to teach patients about the importance of maintaining balance as they age  and ways to minimize their risk of falling.  WORKSHOPS   Nutrition:  Fueling a Ship broker led group instruction and group discussion with PowerPoint presentation and patient guidebook. To enhance the learning environment the use of posters, models and videos may be added. Patients will review the foundational principles of the Pritikin Eating Plan and understand what constitutes a serving size in each of the food groups. Patients will also learn Pritikin-friendly foods that are better choices when away from home and review make-ahead meal and snack options. Calorie density will be reviewed and applied to three nutrition priorities: weight maintenance, weight loss, and weight gain. The purpose of this lesson is to reinforce (in a group setting) the key concepts around what patients are recommended to eat and how to apply these guidelines when away from home by planning and selecting Pritikin-friendly options. Patients will understand how calorie density may be adjusted for different weight management goals.  Mindful Eating  Clinical staff led group instruction and group discussion with PowerPoint presentation and patient guidebook. To enhance the learning environment the use of posters, models and videos may be added. Patients will briefly review the concepts of the Pritikin Eating Plan and the importance of low-calorie dense foods. The concept of mindful eating will be introduced as well as the importance of paying attention to internal hunger signals. Triggers for non-hunger eating and techniques for dealing with triggers will be explored. The purpose of this lesson is to provide patients with the opportunity to review the basic principles of the Pritikin Eating Plan, discuss the value of eating mindfully and how to measure internal cues of hunger and fullness using the Hunger Scale. Patients will also discuss reasons for non-hunger eating and learn strategies to use for controlling  emotional eating.  Targeting Your Nutrition Priorities Clinical staff led group instruction and group discussion with PowerPoint presentation and patient guidebook. To enhance the learning environment the use of posters, models and videos may be added. Patients will learn how to determine their genetic susceptibility to disease by reviewing their family history. Patients will gain insight into the importance of diet as part of an overall healthy lifestyle in mitigating the impact of genetics and other environmental insults. The purpose of this lesson is to provide patients with the opportunity to assess  their personal nutrition priorities by looking at their family history, their own health history and current risk factors. Patients will also be able to discuss ways of prioritizing and modifying the Pritikin Eating Plan for their highest risk areas  Menu  Clinical staff led group instruction and group discussion with PowerPoint presentation and patient guidebook. To enhance the learning environment the use of posters, models and videos may be added. Using menus brought in from [employer], or printed from [employer], patients will apply the Pritikin dining out guidelines that were presented in the [employer] video. Patients will also be able to practice these guidelines in a variety of provided scenarios. The purpose of this lesson is to provide patients with the opportunity to practice hands-on learning of the Pritikin Dining Out guidelines with actual menus and practice scenarios.  Label Reading Clinical staff led group instruction and group discussion with PowerPoint presentation and patient guidebook. To enhance the learning environment the use of posters, models and videos may be added. Patients will review and discuss the Pritikin label reading guidelines presented in [employer]'s Label Reading Educational series video. Using fool labels brought in from local grocery stores  and markets, patients will apply the label reading guidelines and determine if the packaged food meet the Pritikin guidelines. The purpose of this lesson is to provide patients with the opportunity to review, discuss, and practice hands-on learning of the Pritikin Label Reading guidelines with actual packaged food labels. Cooking School  Pritikin's [employer] are designed to teach patients ways to prepare quick, simple, and affordable recipes at home. The importance of nutrition's role in chronic disease risk reduction is reflected in its emphasis in the overall Pritikin program. By learning how to prepare essential core Pritikin Eating Plan recipes, patients will increase control over what they eat; be able to customize the flavor of foods without the use of added salt, sugar, or fat; and improve the quality of the food they consume. By learning a set of core recipes which are easily assembled, quickly prepared, and affordable, patients are more likely to prepare more healthy foods at home. These workshops focus on convenient breakfasts, simple entres, side dishes, and desserts which can be prepared with minimal effort and are consistent with nutrition recommendations for cardiovascular risk reduction. Cooking [employer] are taught by a Armed forces logistics/support/administrative officer (RD) who has been trained by the [employer]. The chef or RD has a clear understanding of the importance of minimizing - if not completely eliminating - added fat, sugar, and sodium in recipes. Throughout the series of Cooking School Workshop sessions, patients will learn about healthy ingredients and efficient methods of cooking to build confidence in their capability to prepare    Cooking School weekly topics:  Adding Flavor- Sodium-Free  Fast and Healthy Breakfasts  Powerhouse Plant-Based Proteins  Satisfying Salads and Dressings  Simple Sides and Sauces  International Cuisine-Spotlight on the [employer]  Zones  Delicious Desserts  Savory Soups  [employer] - Meals in a Astronomer Appetizers and Snacks  Comforting Weekend Breakfasts  One-Pot Wonders   Fast Evening Meals  Landscape architect Your Pritikin Plate  WORKSHOPS   Healthy Mindset (Psychosocial):  Focused Goals, Sustainable Changes Clinical staff led group instruction and group discussion with PowerPoint presentation and patient guidebook. To enhance the learning environment the use of posters, models and videos may be added. Patients will be able to apply effective goal setting strategies to establish  at least one personal goal, and then take consistent, meaningful action toward that goal. They will learn to identify common barriers to achieving personal goals and develop strategies to overcome them. Patients will also gain an understanding of how our mind-set can impact our ability to achieve goals and the importance of cultivating a positive and growth-oriented mind-set. The purpose of this lesson is to provide patients with a deeper understanding of how to set and achieve personal goals, as well as the tools and strategies needed to overcome common obstacles which may arise along the way.  From Head to Heart: The Power of a Healthy Outlook  Clinical staff led group instruction and group discussion with PowerPoint presentation and patient guidebook. To enhance the learning environment the use of posters, models and videos may be added. Patients will be able to recognize and describe the impact of emotions and mood on physical health. They will discover the importance of self-care and explore self-care practices which may work for them. Patients will also learn how to utilize the 4 C's to cultivate a healthier outlook and better manage stress and challenges. The purpose of this lesson is to demonstrate to patients how a healthy outlook is an essential part of maintaining good health, especially as they continue their  cardiac rehab journey.  Healthy Sleep for a Healthy Heart Clinical staff led group instruction and group discussion with PowerPoint presentation and patient guidebook. To enhance the learning environment the use of posters, models and videos may be added. At the conclusion of this workshop, patients will be able to demonstrate knowledge of the importance of sleep to overall health, well-being, and quality of life. They will understand the symptoms of, and treatments for, common sleep disorders. Patients will also be able to identify daytime and nighttime behaviors which impact sleep, and they will be able to apply these tools to help manage sleep-related challenges. The purpose of this lesson is to provide patients with a general overview of sleep and outline the importance of quality sleep. Patients will learn about a few of the most common sleep disorders. Patients will also be introduced to the concept of "sleep hygiene," and discover ways to self-manage certain sleeping problems through simple daily behavior changes. Finally, the workshop will motivate patients by clarifying the links between quality sleep and their goals of heart-healthy living.   Recognizing and Reducing Stress Clinical staff led group instruction and group discussion with PowerPoint presentation and patient guidebook. To enhance the learning environment the use of posters, models and videos may be added. At the conclusion of this workshop, patients will be able to understand the types of stress reactions, differentiate between acute and chronic stress, and recognize the impact that chronic stress has on their health. They will also be able to apply different coping mechanisms, such as reframing negative self-talk. Patients will have the opportunity to practice a variety of stress management techniques, such as deep abdominal breathing, progressive muscle relaxation, and/or guided imagery.  The purpose of this lesson is to educate  patients on the role of stress in their lives and to provide healthy techniques for coping with it.  Learning Barriers/Preferences:  Learning Barriers/Preferences - 01/09/23 1207       Learning Barriers/Preferences   Learning Barriers Sight;Hearing;Exercise Concerns   Pt wears glasses, hearing aides and has H/O falls   Learning Preferences Audio;Computer/Internet;Group Instruction;Individual Instruction;Pictoral;Skilled Demonstration;Verbal Instruction;Video;Written Material             Education Topics:  Knowledge Questionnaire Score:  Knowledge Questionnaire Score - 01/09/23 1208       Knowledge Questionnaire Score   Pre Score 16/24             Core Components/Risk Factors/Patient Goals at Admission:  Personal Goals and Risk Factors at Admission - 01/09/23 1210       Core Components/Risk Factors/Patient Goals on Admission    Weight Management Yes;Obesity;Weight Loss    Intervention Weight Management: Provide education and appropriate resources to help participant work on and attain dietary goals.;Weight Management: Develop a combined nutrition and exercise program designed to reach desired caloric intake, while maintaining appropriate intake of nutrient and fiber, sodium and fats, and appropriate energy expenditure required for the weight goal.;Weight Management/Obesity: Establish reasonable short term and long term weight goals.;Obesity: Provide education and appropriate resources to help participant work on and attain dietary goals.    Expected Outcomes Short Term: Continue to assess and modify interventions until short term weight is achieved;Long Term: Adherence to nutrition and physical activity/exercise program aimed toward attainment of established weight goal;Weight Loss: Understanding of general recommendations for a balanced deficit meal plan, which promotes 1-2 lb weight loss per week and includes a negative energy balance of [phone] kcal/d;Understanding  recommendations for meals to include 15-35% energy as protein, 25-35% energy from fat, 35-60% energy from carbohydrates, less than 200mg  of dietary cholesterol, 20-35 gm of total fiber daily;Understanding of distribution of calorie intake throughout the day with the consumption of 4-5 meals/snacks    Heart Failure Yes    Intervention Provide a combined exercise and nutrition program that is supplemented with education, support and counseling about heart failure. Directed toward relieving symptoms such as shortness of breath, decreased exercise tolerance, and extremity edema.    Expected Outcomes Improve functional capacity of life;Short term: Attendance in program 2-3 days a week with increased exercise capacity. Reported lower sodium intake. Reported increased fruit and vegetable intake. Reports medication compliance.;Short term: Daily weights obtained and reported for increase. Utilizing diuretic protocols set by physician.;Long term: Adoption of self-care skills and reduction of barriers for early signs and symptoms recognition and intervention leading to self-care maintenance.    Hypertension Yes    Intervention Provide education on lifestyle modifcations including regular physical activity/exercise, weight management, moderate sodium restriction and increased consumption of fresh fruit, vegetables, and low fat dairy, alcohol moderation, and smoking cessation.;Monitor prescription use compliance.    Expected Outcomes Short Term: Continued assessment and intervention until BP is < 140/84mm HG in hypertensive participants. < 130/61mm HG in hypertensive participants with diabetes, heart failure or chronic kidney disease.;Long Term: Maintenance of blood pressure at goal levels.    Lipids Yes    Intervention Provide education and support for participant on nutrition & aerobic/resistive exercise along with prescribed medications to achieve LDL 70mg , HDL >40mg .    Expected Outcomes Short Term: Participant  states understanding of desired cholesterol values and is compliant with medications prescribed. Participant is following exercise prescription and nutrition guidelines.;Long Term: Cholesterol controlled with medications as prescribed, with individualized exercise RX and with personalized nutrition plan. Value goals: LDL < 70mg , HDL > 40 mg.    Stress Yes    Intervention Offer individual and/or small group education and counseling on adjustment to heart disease, stress management and health-related lifestyle change. Teach and support self-help strategies.;Refer participants experiencing significant psychosocial distress to appropriate mental health specialists for further evaluation and treatment. When possible, include family members and significant others in education/counseling sessions.    Expected Outcomes Short Term: Participant  demonstrates changes in health-related behavior, relaxation and other stress management skills, ability to obtain effective social support, and compliance with psychotropic medications if prescribed.;Long Term: Emotional wellbeing is indicated by absence of clinically significant psychosocial distress or social isolation.    Personal Goal Other Yes    Personal Goal Long and shor the same: get back into exercise and gain strength    Intervention will continue to monitor pt and progress workloads as tolerated without sign or symptom    Expected Outcomes Pt will achieve her goals             Core Components/Risk Factors/Patient Goals Review:   Goals and Risk Factor Review     Row Name 10/02/22 1546 10/15/22 1115           Core Components/Risk Factors/Patient Goals Review   Personal Goals Review Weight Management/Obesity;Heart Failure;Stress;Hypertension;Lipids Weight Management/Obesity;Heart Failure;Stress;Hypertension;Lipids      Review Elizabella started intensive cardiac rehab on  10/02/22. Jaileen did well with exercise. Vital signs stable. Zaylyn is somewhat  decondtioned. Terrica enjoyed her first day of exercise. Krystian started intensive cardiac rehab on  10/02/22. Zo is off to a good start to exercise. Vital signs stable. Jonetta has lost 4.6 kg since starting cardiac rehab      Expected Outcomes Areil will continue to participate in intensive cardiac rehab for exercise, nutrition and lifestyle modifications Denashia will continue to participate in intensive cardiac rehab for exercise, nutrition and lifestyle modifications               Core Components/Risk Factors/Patient Goals at Discharge (Final Review):   Goals and Risk Factor Review - 10/15/22 1115       Core Components/Risk Factors/Patient Goals Review   Personal Goals Review Weight Management/Obesity;Heart Failure;Stress;Hypertension;Lipids    Review Lener started intensive cardiac rehab on  10/02/22. Chadwick is off to a good start to exercise. Vital signs stable. Zamyra has lost 4.6 kg since starting cardiac rehab    Expected Outcomes Foua will continue to participate in intensive cardiac rehab for exercise, nutrition and lifestyle modifications             ITP Comments:  ITP Comments     Row Name 09/26/22 1424 10/15/22 1112 01/09/23 1155       ITP Comments Dr. Armanda Magic medical director. Introduction to pritikin education program/ intensive cardiac rehab. Initial orientation packet reviewed with patient. 30 Day ITP Review. Nadezhda started intensive cardiac rehab on 10/02/22 and is off to a good start toexercise. Jacquelin fell at home vaccuming at home yesterday 10/14/22 and will need to be evaluated by her primary care provider before returning to exercise Dr. Armanda Magic medical director. Introduction to pritikin education program/ intensive cardiac rehab. Initial orientation packet reviewed with patient.              Comments: Participant previously attended orientation on 09/26/2022 but was discharged due to illness, fall and poor attendance. Today patient attended a new orientation for the  cardiac rehabilitation program on  01/09/2023  to perform initial intake and exercise walk test. Patient introduced to the Pritikin Program education and orientation packet was reviewed. Completed 6-minute walk test, measurements, initial ITP, and exercise prescription. Vital signs stable. Telemetry-normal sinus rhythm, asymptomatic.   Service time was from 0930 to 1120.

## 2023-01-09 NOTE — Progress Notes (Signed)
Cardiac Rehab Medication Review   Does the patient  feel that his/her medications are working for him/her?  YES  Has the patient been experiencing any side effects to the medications prescribed?  NO  Does the patient measure his/her own blood pressure or blood glucose at home?  UNKNOWN  Does the patient have any problems obtaining medications due to transportation or finances?   YES  Understanding of regimen: good Understanding of indications: good Potential of compliance: fair   Comments: patient has a good understanding of her medications, but financially cannot afford some and others needs follow up with physicians for refills. [facility] nurse navigator made appt for pt today with physicians office to help relieve barriers to medications  Harrie Jeans 01/09/2023 12:15 PM

## 2023-01-09 NOTE — Telephone Encounter (Signed)
Pt called very upset that she has not been able to get a refill for her diazepam, she states she has been on this for 50+ years and Dr Sherwood Gambler (pcp) is on vacation, his office will not refill without appt however she can't get an appt and they advised her to come to walk-in clinic at 7 am. She states if she did theres still no guarntee she would be seen. Pt very upset over this. Advised we do not normally prescribe this med, she acknowledges this and ask for just a few to help until she can get in with pcp. Per Dr Shirlee Latch rx for 5 tabs printed for pt, she will come by to pick up

## 2023-01-09 NOTE — Progress Notes (Signed)
Pt in today for cardiac rehab orientation. During Medication Reconciliation pt reported that she has been out of her Diazepam  for several weeks.  Unable to get the medication refilled.  Noted that pt uses [facility].  Contacted [facility] for more information.  Informed that the medication was dispensed on 09/23/22 and she received 90 tablets.  Noted that the pharmacy did send via fax to her PCP - Dr. Sherwood Gambler last week.  Received message back that the pt needed to be seen before any refills provided. Called Dr. Sherwood Gambler office to help with scheduling an appt.  Informed that Dr. Sherwood Gambler is on vacation this week and the covering provider Dr. Renette Butters would like for her to be seen as she was last seen in December.  Able to scheduled a telephone visit with Dr. Renette Butters between 12:30- 1:30.  Request made for the provider to contact her on her cell phone verses through the portal. Runa stated she tried to do  a tele visit before and it was "too complicated". PCP provider will contact pt by 12:00 if request has been approved for direct call.  Written information provided for pt who verbalized understanding. Alanson Aly, BSN Cardiac and Emergency planning/management officer

## 2023-01-09 NOTE — Progress Notes (Signed)
Cardiac Individual Treatment Plan  Patient Details  Name: Brittany Jackson MRN: [id number] Date of Birth: [birth year] Referring Provider:   Flowsheet Row INTENSIVE CARDIAC REHAB ORIENT from 01/09/2023 in [facility]  Referring Provider Dr. Marca Ancona, MD       Initial Encounter Date:  Flowsheet Row INTENSIVE CARDIAC REHAB ORIENT from 01/09/2023 in [facility] for Heart, Vascular, & Lung Health  Date 01/09/23       Visit Diagnosis: 01/08/22 Type 2 STEMI  Patient's Home Medications on Admission:  Current Outpatient Medications:    acetaminophen (TYLENOL) 500 MG tablet, Take 500-1,000 mg by mouth every 6 (six) hours as needed (pain.)., Disp: , Rfl:    albuterol (VENTOLIN HFA) 108 (90 Base) MCG/ACT inhaler, Inhale 2 puffs into the lungs every 6 (six) hours as needed for wheezing or shortness of breath., Disp: 8 g, Rfl: 6   amiodarone (PACERONE) 200 MG tablet, Take 0.5 tablets (100 mg total) by mouth daily., Disp: 45 tablet, Rfl: 3   apixaban (ELIQUIS) 2.5 MG TABS tablet, Take 1 tablet (2.5 mg total) by mouth 2 (two) times daily., Disp: 60 tablet, Rfl: 11   atorvastatin (LIPITOR) 80 MG tablet, TAKE ONE TABLET BY MOUTH DAILY, Disp: 30 tablet, Rfl: 5   empagliflozin (JARDIANCE) 10 MG TABS tablet, Take 1 tablet (10 mg total) by mouth daily before breakfast., Disp: 14 tablet, Rfl: 11   hydrocortisone cream 1 %, Apply topically as needed for itching., Disp: 28 g, Rfl: 0   Multiple Vitamins-Minerals (ADULT ONE DAILY GUMMIES PO), Take 1 tablet by mouth in the morning., Disp: , Rfl:    torsemide (DEMADEX) 20 MG tablet, Take 1 tablet (20 mg total) by mouth every other day., Disp: 45 tablet, Rfl: 3   Coenzyme Q10 (COQ10 PO), Take 1 capsule by mouth in the morning. (Patient not taking: Reported on 01/09/2023), Disp: , Rfl:    colchicine 0.6 MG tablet, Take 0.6 mg by mouth 2 (two) times daily as needed (onset of gout).  (Patient not taking: Reported on 01/09/2023), Disp: , Rfl:    Cyanocobalamin POWD, Take by mouth. (Patient not taking: Reported on 01/09/2023), Disp: , Rfl:    diazepam (VALIUM) 10 MG tablet, Take 10 mg by mouth 3 (three) times daily as needed for anxiety. (Patient not taking: Reported on 01/09/2023), Disp: , Rfl:    methylPREDNISolone (MEDROL DOSEPAK) 4 MG TBPK tablet, See admin instructions. (Patient not taking: Reported on 01/08/2023), Disp: , Rfl:    Red Yeast Rice 600 MG CAPS, Take 600 mg by mouth daily. (Patient not taking: Reported on 01/09/2023), Disp: , Rfl:    Tiotropium Bromide-Olodaterol (STIOLTO RESPIMAT) 2.5-2.5 MCG/ACT AERS, Inhale 2 puffs into the lungs daily., Disp: 1 each, Rfl: 0   umeclidinium-vilanterol (ANORO ELLIPTA) 62.5-25 MCG/ACT AEPB, Inhale 1 puff into the lungs daily. (Patient not taking: Reported on 01/09/2023), Disp: 60 each, Rfl: 5  Past Medical History: Past Medical History:  Diagnosis Date   CHF (congestive heart failure) (HCC)    Hypertension    Urticaria     Tobacco Use: Social History   Tobacco Use  Smoking Status Former   Packs/day: 1.00   Years: 60.00   Additional pack years: 0.00   Total pack years: 60.00   Types: Cigarettes   Start date: 73   Quit date: 01/08/2022   Years since quitting: 1.0  Smokeless Tobacco Never    Labs: Review Flowsheet  More data exists  Latest Ref Rng & Units 01/20/2022 01/21/2022 01/22/2022 03/19/2022 12/27/2022  Labs for ITP Cardiac and Pulmonary Rehab  Cholestrol 0 - 200 mg/dL - - - 161  096   LDL (calc) 0 - 99 mg/dL - - - 71  63   HDL-C >04 mg/dL - - - 56  74   Trlycerides <150 mg/dL - - - 540  981   Hemoglobin A1c 4.8 - 5.6 % - - - - 5.7   O2 Saturation % 78.5  87.6  58  47.9  - -    Capillary Blood Glucose: Lab Results  Component Value Date   GLUCAP 105 (H) 01/22/2022   GLUCAP 80 01/22/2022   GLUCAP 83 01/22/2022   GLUCAP 93 01/21/2022   GLUCAP 86 01/21/2022     Exercise Target Goals: Exercise  Program Goal: Individual exercise prescription set using results from initial 6 min walk test and THRR while considering  patient's activity barriers and safety.   Exercise Prescription Goal: Initial exercise prescription builds to 30-45 minutes a day of aerobic activity, 2-3 days per week.  Home exercise guidelines will be given to patient during program as part of exercise prescription that the participant will acknowledge.  Activity Barriers & Risk Stratification:  Activity Barriers & Cardiac Risk Stratification - 01/09/23 1159       Activity Barriers & Cardiac Risk Stratification   Activity Barriers Joint Problems;Deconditioning;History of Falls;Balance Concerns;Muscular Weakness;Decreased Ventricular Function    Cardiac Risk Stratification High             6 Minute Walk:  6 Minute Walk     Row Name 09/26/22 1555 01/09/23 1156       6 Minute Walk   Phase Initial Initial    Distance 960 feet 840 feet    Walk Time 6 minutes 6 minutes    # of Rest Breaks 1  2:27-3:50 SOB, SpO2 100% 2  Both standing rest breaks due to hip pain and fatigue 3.24-3.44 and 1.07-1.28    MPH 1.8 1.59    METS 1.2 0.69    RPE 13 12    Perceived Dyspnea  1 0    VO2 Peak 4.22 2.41    Symptoms Yes (comment) Yes (comment)    Comments SOB, resolved with rest. Chronic bilateral hip pain, left toe gout pain, and fatigue 3/10 pain    Resting HR 69 bpm 79 bpm    Resting BP 120/54 120/60    Resting Oxygen Saturation  96 % 98 %    Exercise Oxygen Saturation  during 6 min walk 97 % 99 %    Max Ex. HR 110 bpm 106 bpm    Max Ex. BP 168/52 150/74    2 Minute Post BP 124/52 134/64             Oxygen Initial Assessment:   Oxygen Re-Evaluation:   Oxygen Discharge (Final Oxygen Re-Evaluation):   Initial Exercise Prescription:  Initial Exercise Prescription - 01/09/23 1200       Date of Initial Exercise RX and Referring Provider   Date 01/09/23    Referring Provider Dr. Marca Ancona, MD     Expected Discharge Date 03/21/23      NuStep   Level 1    SPM 80    Minutes 20    METs 1.7      Prescription Details   Frequency (times per week) 3    Duration Progress to 30 minutes of continuous aerobic without signs/symptoms of physical distress  Intensity   THRR 40-80% of Max Heartrate 54-109    Ratings of Perceived Exertion 11-13    Perceived Dyspnea 0-4      Progression   Progression Continue to progress workloads to maintain intensity without signs/symptoms of physical distress.      Resistance Training   Training Prescription Yes    Weight 2    Reps 10-15             Perform Capillary Blood Glucose checks as needed.  Exercise Prescription Changes:   Exercise Prescription Changes     Row Name 10/02/22 1615             Response to Exercise   Blood Pressure (Admit) 120/70       Blood Pressure (Exercise) 144/68       Blood Pressure (Exit) 108/80       Heart Rate (Admit) 69 bpm       Heart Rate (Exercise) 109 bpm       Heart Rate (Exit) 75 bpm       Rating of Perceived Exertion (Exercise) 13       Symptoms none       Comments Pt first day in teh [facility] ICR program       Duration Progress to 30 minutes of  aerobic without signs/symptoms of physical distress       Intensity THRR unchanged         Progression   Progression Continue to progress workloads to maintain intensity without signs/symptoms of physical distress.       Average METs 2.3         Resistance Training   Training Prescription No         NuStep   Level 2       SPM 95       Minutes 25       METs 2.3                Exercise Comments:   Exercise Comments     Row Name 10/02/22 1620 11/01/22 0851         Exercise Comments Pt first day in the CRP2 program, Pt tolerated exercise well with an average MET level of 2.3. Pt is learning her THRR, RPE and ExRx Pt has been absent. Will review goals and education upon her return               Exercise Goals and  Review:   Exercise Goals     Row Name 09/26/22 1600 01/09/23 1202           Exercise Goals   Increase Physical Activity Yes Yes      Intervention Provide advice, education, support and counseling about physical activity/exercise needs.;Develop an individualized exercise prescription for aerobic and resistive training based on initial evaluation findings, risk stratification, comorbidities and participant's personal goals. Provide advice, education, support and counseling about physical activity/exercise needs.;Develop an individualized exercise prescription for aerobic and resistive training based on initial evaluation findings, risk stratification, comorbidities and participant's personal goals.      Expected Outcomes Short Term: Attend rehab on a regular basis to increase amount of physical activity.;Long Term: Exercising regularly at least 3-5 days a week.;Long Term: Add in home exercise to make exercise part of routine and to increase amount of physical activity. Short Term: Attend rehab on a regular basis to increase amount of physical activity.;Long Term: Exercising regularly at least 3-5 days a week.;Long Term: Add in home exercise to  make exercise part of routine and to increase amount of physical activity.      Increase Strength and Stamina Yes Yes      Intervention Provide advice, education, support and counseling about physical activity/exercise needs.;Develop an individualized exercise prescription for aerobic and resistive training based on initial evaluation findings, risk stratification, comorbidities and participant's personal goals. Provide advice, education, support and counseling about physical activity/exercise needs.;Develop an individualized exercise prescription for aerobic and resistive training based on initial evaluation findings, risk stratification, comorbidities and participant's personal goals.      Expected Outcomes Short Term: Increase workloads from initial exercise  prescription for resistance, speed, and METs.;Short Term: Perform resistance training exercises routinely during rehab and add in resistance training at home;Long Term: Improve cardiorespiratory fitness, muscular endurance and strength as measured by increased METs and functional capacity ( ) Short Term: Increase workloads from initial exercise prescription for resistance, speed, and METs.;Short Term: Perform resistance training exercises routinely during rehab and add in resistance training at home;Long Term: Improve cardiorespiratory fitness, muscular endurance and strength as measured by increased METs and functional capacity ( )      Able to understand and use rate of perceived exertion (RPE) scale Yes Yes      Intervention Provide education and explanation on how to use RPE scale Provide education and explanation on how to use RPE scale      Expected Outcomes Short Term: Able to use RPE daily in rehab to express subjective intensity level;Long Term:  Able to use RPE to guide intensity level when exercising independently Short Term: Able to use RPE daily in rehab to express subjective intensity level;Long Term:  Able to use RPE to guide intensity level when exercising independently      Able to understand and use Dyspnea scale Yes --      Intervention Provide education and explanation on how to use Dyspnea scale --      Expected Outcomes Short Term: Able to use Dyspnea scale daily in rehab to express subjective sense of shortness of breath during exertion;Long Term: Able to use Dyspnea scale to guide intensity level when exercising independently --      Knowledge and understanding of Target Heart Rate Range (THRR) Yes Yes      Intervention Provide education and explanation of THRR including how the numbers were predicted and where they are located for reference Provide education and explanation of THRR including how the numbers were predicted and where they are located for reference      Expected  Outcomes Short Term: Able to state/look up THRR;Long Term: Able to use THRR to govern intensity when exercising independently;Short Term: Able to use daily as guideline for intensity in rehab Short Term: Able to state/look up THRR;Long Term: Able to use THRR to govern intensity when exercising independently;Short Term: Able to use daily as guideline for intensity in rehab      Understanding of Exercise Prescription Yes Yes      Intervention Provide education, explanation, and written materials on patient's individual exercise prescription Provide education, explanation, and written materials on patient's individual exercise prescription      Expected Outcomes Short Term: Able to explain program exercise prescription;Long Term: Able to explain home exercise prescription to exercise independently Short Term: Able to explain program exercise prescription;Long Term: Able to explain home exercise prescription to exercise independently               Exercise Goals Re-Evaluation :  Exercise Goals Re-Evaluation  Row Name 10/02/22 1618             Exercise Goal Re-Evaluation   Exercise Goals Review Increase Physical Activity;Understanding of Exercise Prescription;Increase Strength and Stamina;Knowledge and understanding of Target Heart Rate Range (THRR);Able to understand and use rate of perceived exertion (RPE) scale       Comments Pt first day in the CRP2 program, Pt tolerated exercise well with an average MET level of 2.3. Pt is learning her THRR, RPE and ExRx       Expected Outcomes Will continue to monitor pt and progress workloads as tolerated without sign or symptom.                Discharge Exercise Prescription (Final Exercise Prescription Changes):  Exercise Prescription Changes - 10/02/22 1615       Response to Exercise   Blood Pressure (Admit) 120/70    Blood Pressure (Exercise) 144/68    Blood Pressure (Exit) 108/80    Heart Rate (Admit) 69 bpm    Heart Rate (Exercise)  109 bpm    Heart Rate (Exit) 75 bpm    Rating of Perceived Exertion (Exercise) 13    Symptoms none    Comments Pt first day in teh [facility] ICR program    Duration Progress to 30 minutes of  aerobic without signs/symptoms of physical distress    Intensity THRR unchanged      Progression   Progression Continue to progress workloads to maintain intensity without signs/symptoms of physical distress.    Average METs 2.3      Resistance Training   Training Prescription No      NuStep   Level 2    SPM 95    Minutes 25    METs 2.3             Nutrition:  Target Goals: Understanding of nutrition guidelines, daily intake of sodium 1500mg , cholesterol 200mg , calories 30% from fat and 7% or less from saturated fats, daily to have 5 or more servings of fruits and vegetables.  Biometrics:  Pre Biometrics - 01/09/23 1202       Pre Biometrics   Height 5\' 5"  (1.651 m)    Weight 113.1 kg    Waist Circumference 51.5 inches    Hip Circumference 57.5 inches    Waist to Hip Ratio 0.9 %    BMI (Calculated) 41.49    Triceps Skinfold 35 mm    Grip Strength 27 kg    Flexibility --   Did not attempt due to pain   Single Leg Stand 1.2 seconds              Nutrition Therapy Plan and Nutrition Goals:  Nutrition Therapy & Goals - 11/04/22 0912       Nutrition Therapy   Diet Heart healthy diet    Drug/Food Interactions Statins/Certain Fruits      Personal Nutrition Goals   Nutrition Goal Patient to identify strategies for reducing cardiovascular risk by attending the weekly [facility] education and nutrition series    Personal Goal #2 Patient to improve diet quality by using the plate method as a daily guide for meal planning to include lean protein/plant protein, fruits, vegetables, whole grains, nonfat dairy as part of well balanced diet    Personal Goal #3 Patient to reduce sodium intake to 1500mg  per day    Comments Patient has not attended cardiac rehab since 10/09/22. Goals  have not been reassessed due to attendance. Patient will continue to benefit  from participation in intensive cardiac rehab for nutrition, exercise, and lifestyle modfications as able.      Intervention Plan   Intervention Prescribe, educate and counsel regarding individualized specific dietary modifications aiming towards targeted core components such as weight, hypertension, lipid management, diabetes, heart failure and other comorbidities.;Nutrition handout(s) given to patient.    Expected Outcomes Short Term Goal: Understand basic principles of dietary content, such as calories, fat, sodium, cholesterol and nutrients.;Long Term Goal: Adherence to prescribed nutrition plan.             Nutrition Assessments:  MEDIFICTS Score Key: ?70 Need to make dietary changes  40-70 Heart Healthy Diet ? 40 Therapeutic Level Cholesterol Diet    Picture Your Plate Scores: <16 Unhealthy dietary pattern with much room for improvement. 41-50 Dietary pattern unlikely to meet recommendations for good health and room for improvement. 51-60 More healthful dietary pattern, with some room for improvement.  >60 Healthy dietary pattern, although there may be some specific behaviors that could be improved.    Nutrition Goals Re-Evaluation:  Nutrition Goals Re-Evaluation     Row Name 10/03/22 0853 11/04/22 0912           Goals   Current Weight 245 lb 9.5 oz (111.4 kg) --      Comment lipids WNL, Cr 1.99, GFR 24 No new labs; most recent labs  lipids WNL, Cr 1.99, GFR 24      Expected Outcome Kaimana follows with outside nephrology; not able to view notes/labs. She continues regular follow-up with the heart failure clinic. Orianna will continue to benefit from particiation in intensive cardiac rehab for nutrition, exercise, and lifestyle modification. Patient has not attended cardiac rehab since 10/09/22. Goals have not been reassessed due to attendance. Patient will continue to benefit from participation in  intensive cardiac rehab for nutrition, exercise, and lifestyle modfications as able.               Nutrition Goals Re-Evaluation:  Nutrition Goals Re-Evaluation     Row Name 10/03/22 0853 11/04/22 0912           Goals   Current Weight 245 lb 9.5 oz (111.4 kg) --      Comment lipids WNL, Cr 1.99, GFR 24 No new labs; most recent labs  lipids WNL, Cr 1.99, GFR 24      Expected Outcome Lyrah follows with outside nephrology; not able to view notes/labs. She continues regular follow-up with the heart failure clinic. Shaquanna will continue to benefit from particiation in intensive cardiac rehab for nutrition, exercise, and lifestyle modification. Patient has not attended cardiac rehab since 10/09/22. Goals have not been reassessed due to attendance. Patient will continue to benefit from participation in intensive cardiac rehab for nutrition, exercise, and lifestyle modfications as able.               Nutrition Goals Discharge (Final Nutrition Goals Re-Evaluation):  Nutrition Goals Re-Evaluation - 11/04/22 0912       Goals   Comment No new labs; most recent labs  lipids WNL, Cr 1.99, GFR 24    Expected Outcome Patient has not attended cardiac rehab since 10/09/22. Goals have not been reassessed due to attendance. Patient will continue to benefit from participation in intensive cardiac rehab for nutrition, exercise, and lifestyle modfications as able.             Psychosocial: Target Goals: Acknowledge presence or absence of significant depression and/or stress, maximize coping skills, provide positive support system. Participant is  able to verbalize types and ability to use techniques and skills needed for reducing stress and depression.  Initial Review & Psychosocial Screening:  Initial Psych Review & Screening - 01/09/23 1009       Initial Review   Current issues with Current Depression;Current Stress Concerns;History of Depression    Source of Stress Concerns Chronic  Illness;Financial    Comments Pt states she has a hard time with her finances and her heart condition. Both of these issues are stresses for her, but she has friends and family who help. She has denighed counclining at this time      Family Dynamics   Good Support System? Yes    Comments friends and family      Barriers   Psychosocial barriers to participate in program The patient should benefit from training in stress management and relaxation.      Screening Interventions   Interventions Encouraged to exercise;To provide support and resources with identified psychosocial needs;Provide feedback about the scores to participant    Expected Outcomes Short Term goal: Identification and review with participant of any Quality of Life or Depression concerns found by scoring the questionnaire.   No counciling wanted            Quality of Life Scores:  Quality of Life - 01/09/23 1206       Quality of Life   Select Quality of Life      Quality of Life Scores   Health/Function Pre 19.85 %    Socioeconomic Pre 27.83 %    Psych/Spiritual Pre 27.43 %    Family Pre 12 %    GLOBAL Pre 22.52 %            Scores of 19 and below usually indicate a poorer quality of life in these areas.  A difference of  2-3 points is a clinically meaningful difference.  A difference of 2-3 points in the total score of the Quality of Life Index has been associated with significant improvement in overall quality of life, self-image, physical symptoms, and general health in studies assessing change in quality of life.  PHQ-9: Review Flowsheet       01/09/2023 09/26/2022  Depression screen PHQ 2/9  Decreased Interest 2 0  Down, Depressed, Hopeless 1 1  PHQ - 2 Score 3 1  Altered sleeping 0 0  Tired, decreased energy 2 2  Change in appetite 2 3  Feeling bad or failure about yourself  1 2  Trouble concentrating 0 0  Moving slowly or fidgety/restless 0 1  Suicidal thoughts 0 0  PHQ-9 Score 8 9   Difficult doing work/chores Not difficult at all Not difficult at all   Interpretation of Total Score  Total Score Depression Severity:  1-4 = Minimal depression, 5-9 = Mild depression, 10-14 = Moderate depression, 15-19 = Moderately severe depression, 20-27 = Severe depression   Psychosocial Evaluation and Intervention:   Psychosocial Re-Evaluation:  Psychosocial Re-Evaluation     Row Name 10/02/22 1544 10/15/22 1114           Psychosocial Re-Evaluation   Current issues with Current Abuse or Neglect to Report;Current Anxiety/Panic;History of Depression Current Abuse or Neglect to Report;Current Anxiety/Panic;History of Depression      Comments Breona denies being depressed on her first day of exercise but admits to having a history of depression Jenson has not voiced any increased concerns or stressors at intensive cardiac rehab      Expected Outcomes Tajane will have decreased  or controlled depression and anxiety upon completion of her first day of exercise Mayliah will have decreased or controlled depression and anxiety upon completion of her first day of exercise      Interventions Stress management education;Relaxation education;Encouraged to attend Cardiac Rehabilitation for the exercise Stress management education;Relaxation education;Encouraged to attend Cardiac Rehabilitation for the exercise      Continue Psychosocial Services  No Follow up required No Follow up required               Psychosocial Discharge (Final Psychosocial Re-Evaluation):  Psychosocial Re-Evaluation - 10/15/22 1114       Psychosocial Re-Evaluation   Current issues with Current Abuse or Neglect to Report;Current Anxiety/Panic;History of Depression    Comments Lulubelle has not voiced any increased concerns or stressors at intensive cardiac rehab    Expected Outcomes Braylei will have decreased or controlled depression and anxiety upon completion of her first day of exercise    Interventions Stress management  education;Relaxation education;Encouraged to attend Cardiac Rehabilitation for the exercise    Continue Psychosocial Services  No Follow up required             Vocational Rehabilitation: Provide vocational rehab assistance to qualifying candidates.   Vocational Rehab Evaluation & Intervention:  Vocational Rehab - 01/09/23 1210       Initial Vocational Rehab Evaluation & Intervention   Assessment shows need for Vocational Rehabilitation No   Patient is retired     Art therapist   Comments --             Education: Education Goals: Education classes will be provided on a weekly basis, covering required topics. Participant will state understanding/return demonstration of topics presented.    Education     Row Name 10/02/22 1600     Education   Cardiac Education Topics [facility]   Customer service manager   Weekly Topic Comforting Weekend Breakfasts   Instruction Review Code 1- Verbalizes Understanding   Class Start Time 1400   Class Stop Time 1445   Class Time Calculation (min) 45 min    Row Name 10/07/22 1600     Education   Cardiac Education Topics [facility]   Select Core Videos     Core Videos   Educator Exercise Physiologist   Select Exercise Education   Exercise Education Biomechanial Limitations   Instruction Review Code 1- Verbalizes Understanding   Class Start Time 1406   Class Stop Time 1442   Class Time Calculation (min) 36 min    Row Name 10/09/22 1600     Education   Cardiac Education Topics Pritikin   Customer service manager   Weekly Topic Fast Evening Meals   Instruction Review Code 1- Verbalizes Understanding   Class Start Time 1400   Class Stop Time 1445   Class Time Calculation (min) 45 min            Core Videos: Exercise    Move It!  Clinical staff conducted group or individual video education with verbal and written material  and guidebook.  Patient learns the recommended Pritikin exercise program. Exercise with the goal of living a long, healthy life. Some of the health benefits of exercise include controlled diabetes, healthier blood pressure levels, improved cholesterol levels, improved heart and lung capacity, improved sleep, and better body composition. Everyone should speak with their doctor before starting or changing  an exercise routine.  Biomechanical Limitations Clinical staff conducted group or individual video education with verbal and written material and guidebook.  Patient learns how biomechanical limitations can impact exercise and how we can mitigate and possibly overcome limitations to have an impactful and balanced exercise routine.  Body Composition Clinical staff conducted group or individual video education with verbal and written material and guidebook.  Patient learns that body composition (ratio of muscle mass to fat mass) is a key component to assessing overall fitness, rather than body weight alone. Increased fat mass, especially visceral belly fat, can put [place] at increased risk for metabolic syndrome, type 2 diabetes, heart disease, and even death. It is recommended to combine diet and exercise (cardiovascular and resistance training) to improve your body composition. Seek guidance from your physician and exercise physiologist before implementing an exercise routine.  Exercise Action Plan Clinical staff conducted group or individual video education with verbal and written material and guidebook.  Patient learns the recommended strategies to achieve and enjoy long-term exercise adherence, including variety, self-motivation, self-efficacy, and positive decision making. Benefits of exercise include fitness, good health, weight management, more energy, better sleep, less stress, and overall well-being.  Medical   Heart Disease Risk Reduction Clinical staff conducted group or individual video  education with verbal and written material and guidebook.  Patient learns our heart is our most vital organ as it circulates oxygen, nutrients, white blood cells, and hormones throughout the entire body, and carries waste away. Data supports a plant-based eating plan like the Pritikin Program for its effectiveness in slowing progression of and reversing heart disease. The video provides a number of recommendations to address heart disease.   Metabolic Syndrome and Belly Fat  Clinical staff conducted group or individual video education with verbal and written material and guidebook.  Patient learns what metabolic syndrome is, how it leads to heart disease, and how one can reverse it and keep it from coming back. You have metabolic syndrome if you have 3 of the following 5 criteria: abdominal obesity, high blood pressure, high triglycerides, low HDL cholesterol, and high blood sugar.  Hypertension and Heart Disease Clinical staff conducted group or individual video education with verbal and written material and guidebook.  Patient learns that high blood pressure, or hypertension, is very common in the [place]. Hypertension is largely due to excessive salt intake, but other important risk factors include being overweight, physical inactivity, drinking too much alcohol, smoking, and not eating enough potassium from fruits and vegetables. High blood pressure is a leading risk factor for heart attack, stroke, congestive heart failure, dementia, kidney failure, and premature death. Long-term effects of excessive salt intake include stiffening of the arteries and thickening of heart muscle and organ damage. Recommendations include ways to reduce hypertension and the risk of heart disease.  Diseases of Our Time - Focusing on Diabetes Clinical staff conducted group or individual video education with verbal and written material and guidebook.  Patient learns why the best way to stop diseases of our time is  prevention, through food and other lifestyle changes. Medicine (such as prescription pills and surgeries) is often only a Band-Aid on the problem, not a long-term solution. Most common diseases of our time include obesity, type 2 diabetes, hypertension, heart disease, and cancer. The Pritikin Program is recommended and has been proven to help reduce, reverse, and/or prevent the damaging effects of metabolic syndrome.  Nutrition   Overview of the Pritikin Eating Plan  Clinical staff conducted group or  individual video education with verbal and written material and guidebook.  Patient learns about the Pritikin Eating Plan for disease risk reduction. The Pritikin Eating Plan emphasizes a wide variety of unrefined, minimally-processed carbohydrates, like fruits, vegetables, whole grains, and legumes. Go, Caution, and Stop food choices are explained. Plant-based and lean animal proteins are emphasized. Rationale provided for low sodium intake for blood pressure control, low added sugars for blood sugar stabilization, and low added fats and oils for coronary artery disease risk reduction and weight management.  Calorie Density  Clinical staff conducted group or individual video education with verbal and written material and guidebook.  Patient learns about calorie density and how it impacts the Pritikin Eating Plan. Knowing the characteristics of the food you choose will help you decide whether those foods will lead to weight gain or weight loss, and whether you want to consume more or less of them. Weight loss is usually a side effect of the Pritikin Eating Plan because of its focus on low calorie-dense foods.  Label Reading  Clinical staff conducted group or individual video education with verbal and written material and guidebook.  Patient learns about the Pritikin recommended label reading guidelines and corresponding recommendations regarding calorie density, added sugars, sodium content, and whole  grains.  Dining Out - Part 1  Clinical staff conducted group or individual video education with verbal and written material and guidebook.  Patient learns that restaurant meals can be sabotaging because they can be so high in calories, fat, sodium, and/or sugar. Patient learns recommended strategies on how to positively address this and avoid unhealthy pitfalls.  Facts on Fats  Clinical staff conducted group or individual video education with verbal and written material and guidebook.  Patient learns that lifestyle modifications can be just as effective, if not more so, as many medications for lowering your risk of heart disease. A Pritikin lifestyle can help to reduce your risk of inflammation and atherosclerosis (cholesterol build-up, or plaque, in the artery walls). Lifestyle interventions such as dietary choices and physical activity address the cause of atherosclerosis. A review of the types of fats and their impact on blood cholesterol levels, along with dietary recommendations to reduce fat intake is also included.  Nutrition Action Plan  Clinical staff conducted group or individual video education with verbal and written material and guidebook.  Patient learns how to incorporate Pritikin recommendations into their lifestyle. Recommendations include planning and keeping personal health goals in mind as an important part of their success.  Healthy Mind-Set    Healthy Minds, Bodies, Hearts  Clinical staff conducted group or individual video education with verbal and written material and guidebook.  Patient learns how to identify when they are stressed. Video will discuss the impact of that stress, as well as the many benefits of stress management. Patient will also be introduced to stress management techniques. The way we think, act, and feel has an impact on our hearts.  How Our Thoughts Can Heal Our Hearts  Clinical staff conducted group or individual video education with verbal and  written material and guidebook.  Patient learns that negative thoughts can cause depression and anxiety. This can result in negative lifestyle behavior and serious health problems. Cognitive behavioral therapy is an effective method to help control our thoughts in order to change and improve our emotional outlook.  Additional Videos:  Exercise    Improving Performance  Clinical staff conducted group or individual video education with verbal and written material and guidebook.  Patient learns  to use a non-linear approach by alternating intensity levels and lengths of time spent exercising to help burn more calories and lose more body fat. Cardiovascular exercise helps improve heart health, metabolism, hormonal balance, blood sugar control, and recovery from fatigue. Resistance training improves strength, endurance, balance, coordination, reaction time, metabolism, and muscle mass. Flexibility exercise improves circulation, posture, and balance. Seek guidance from your physician and exercise physiologist before implementing an exercise routine and learn your capabilities and proper form for all exercise.  Introduction to Yoga  Clinical staff conducted group or individual video education with verbal and written material and guidebook.  Patient learns about yoga, a discipline of the coming together of mind, breath, and body. The benefits of yoga include improved flexibility, improved range of motion, better posture and core strength, increased lung function, weight loss, and positive self-image. Yoga's heart health benefits include lowered blood pressure, healthier heart rate, decreased cholesterol and triglyceride levels, improved immune function, and reduced stress. Seek guidance from your physician and exercise physiologist before implementing an exercise routine and learn your capabilities and proper form for all exercise.  Medical   Aging: Enhancing Your Quality of Life  Clinical staff conducted  group or individual video education with verbal and written material and guidebook.  Patient learns key strategies and recommendations to stay in good physical health and enhance quality of life, such as prevention strategies, having an advocate, securing a Health Care Proxy and Power of Attorney, and keeping a list of medications and system for tracking them. It also discusses how to avoid risk for bone loss.  Biology of Weight Control  Clinical staff conducted group or individual video education with verbal and written material and guidebook.  Patient learns that weight gain occurs because we consume more calories than we burn (eating more, moving less). Even if your body weight is normal, you may have higher ratios of fat compared to muscle mass. Too much body fat puts you at increased risk for cardiovascular disease, heart attack, stroke, type 2 diabetes, and obesity-related cancers. In addition to exercise, following the Pritikin Eating Plan can help reduce your risk.  Decoding Lab Results  Clinical staff conducted group or individual video education with verbal and written material and guidebook.  Patient learns that lab test reflects one measurement whose values change over time and are influenced by many factors, including medication, stress, sleep, exercise, food, hydration, pre-existing medical conditions, and more. It is recommended to use the knowledge from this video to become more involved with your lab results and evaluate your numbers to speak with your doctor.   Diseases of Our Time - Overview  Clinical staff conducted group or individual video education with verbal and written material and guidebook.  Patient learns that according to the CDC, 50% to 70% of chronic diseases (such as obesity, type 2 diabetes, elevated lipids, hypertension, and heart disease) are avoidable through lifestyle improvements including healthier food choices, listening to satiety cues, and increased physical  activity.  Sleep Disorders Clinical staff conducted group or individual video education with verbal and written material and guidebook.  Patient learns how good quality and duration of sleep are important to overall health and well-being. Patient also learns about sleep disorders and how they impact health along with recommendations to address them, including discussing with a physician.  Nutrition  Dining Out - Part 2 Clinical staff conducted group or individual video education with verbal and written material and guidebook.  Patient learns how to plan ahead and communicate  in order to maximize their dining experience in a healthy and nutritious manner. Included are recommended food choices based on the type of restaurant the patient is visiting.   Fueling a Banker conducted group or individual video education with verbal and written material and guidebook.  There is a strong connection between our food choices and our health. Diseases like obesity and type 2 diabetes are very prevalent and are in large-part due to lifestyle choices. The Pritikin Eating Plan provides plenty of food and hunger-curbing satisfaction. It is easy to follow, affordable, and helps reduce health risks.  Menu Workshop  Clinical staff conducted group or individual video education with verbal and written material and guidebook.  Patient learns that restaurant meals can sabotage health goals because they are often packed with calories, fat, sodium, and sugar. Recommendations include strategies to plan ahead and to communicate with the manager, chef, or server to help order a healthier meal.  Planning Your Eating Strategy  Clinical staff conducted group or individual video education with verbal and written material and guidebook.  Patient learns about the Pritikin Eating Plan and its benefit of reducing the risk of disease. The Pritikin Eating Plan does not focus on calories. Instead, it emphasizes  high-quality, nutrient-rich foods. By knowing the characteristics of the foods, we choose, we can determine their calorie density and make informed decisions.  Targeting Your Nutrition Priorities  Clinical staff conducted group or individual video education with verbal and written material and guidebook.  Patient learns that lifestyle habits have a tremendous impact on disease risk and progression. This video provides eating and physical activity recommendations based on your personal health goals, such as reducing LDL cholesterol, losing weight, preventing or controlling type 2 diabetes, and reducing high blood pressure.  Vitamins and Minerals  Clinical staff conducted group or individual video education with verbal and written material and guidebook.  Patient learns different ways to obtain key vitamins and minerals, including through a recommended healthy diet. It is important to discuss all supplements you take with your doctor.   Healthy Mind-Set    Smoking Cessation  Clinical staff conducted group or individual video education with verbal and written material and guidebook.  Patient learns that cigarette smoking and tobacco addiction pose a serious health risk which affects millions of people. Stopping smoking will significantly reduce the risk of heart disease, lung disease, and many forms of cancer. Recommended strategies for quitting are covered, including working with your doctor to develop a successful plan.  Culinary   Becoming a Set designer conducted group or individual video education with verbal and written material and guidebook.  Patient learns that cooking at home can be healthy, cost-effective, quick, and puts them in control. Keys to cooking healthy recipes will include looking at your recipe, assessing your equipment needs, planning ahead, making it simple, choosing cost-effective seasonal ingredients, and limiting the use of added fats, salts, and  sugars.  Cooking - Breakfast and Snacks  Clinical staff conducted group or individual video education with verbal and written material and guidebook.  Patient learns how important breakfast is to satiety and nutrition through the entire day. Recommendations include key foods to eat during breakfast to help stabilize blood sugar levels and to prevent overeating at meals later in the day. Planning ahead is also a key component.  Cooking - Educational psychologist conducted group or individual video education with verbal and written material and guidebook.  Patient learns  eating strategies to improve overall health, including an approach to cook more at home. Recommendations include thinking of animal protein as a side on your plate rather than center stage and focusing instead on lower calorie dense options like vegetables, fruits, whole grains, and plant-based proteins, such as beans. Making sauces in large quantities to freeze for later and leaving the skin on your vegetables are also recommended to maximize your experience.  Cooking - Healthy Salads and Dressing Clinical staff conducted group or individual video education with verbal and written material and guidebook.  Patient learns that vegetables, fruits, whole grains, and legumes are the foundations of the Pritikin Eating Plan. Recommendations include how to incorporate each of these in flavorful and healthy salads, and how to create homemade salad dressings. Proper handling of ingredients is also covered. Cooking - Soups and [employer] - Soups and Desserts Clinical staff conducted group or individual video education with verbal and written material and guidebook.  Patient learns that Pritikin soups and desserts make for easy, nutritious, and delicious snacks and meal components that are low in sodium, fat, sugar, and calorie density, while high in vitamins, minerals, and filling fiber. Recommendations include simple and healthy  ideas for soups and desserts.   Overview     The Pritikin Solution Program Overview Clinical staff conducted group or individual video education with verbal and written material and guidebook.  Patient learns that the results of the Pritikin Program have been documented in more than 100 articles published in peer-reviewed journals, and the benefits include reducing risk factors for (and, in some cases, even reversing) high cholesterol, high blood pressure, type 2 diabetes, obesity, and more! An overview of the three key pillars of the Pritikin Program will be covered: eating well, doing regular exercise, and having a healthy mind-set.  WORKSHOPS  Exercise: Exercise Basics: Building Your Action Plan Clinical staff led group instruction and group discussion with PowerPoint presentation and patient guidebook. To enhance the learning environment the use of posters, models and videos may be added. At the conclusion of this workshop, patients will comprehend the difference between physical activity and exercise, as well as the benefits of incorporating both, into their routine. Patients will understand the FITT (Frequency, Intensity, Time, and Type) principle and how to use it to build an exercise action plan. In addition, safety concerns and other considerations for exercise and cardiac rehab will be addressed by the presenter. The purpose of this lesson is to promote a comprehensive and effective weekly exercise routine in order to improve patients' overall level of fitness.   Managing Heart Disease: Your Path to a Healthier Heart Clinical staff led group instruction and group discussion with PowerPoint presentation and patient guidebook. To enhance the learning environment the use of posters, models and videos may be added.At the conclusion of this workshop, patients will understand the anatomy and physiology of the heart. Additionally, they will understand how Pritikin's three pillars impact the  risk factors, the progression, and the management of heart disease.  The purpose of this lesson is to provide a high-level overview of the heart, heart disease, and how the Pritikin lifestyle positively impacts risk factors.  Exercise Biomechanics Clinical staff led group instruction and group discussion with PowerPoint presentation and patient guidebook. To enhance the learning environment the use of posters, models and videos may be added. Patients will learn how the structural parts of their bodies function and how these functions impact their daily activities, movement, and exercise. Patients will learn how  to promote a neutral spine, learn how to manage pain, and identify ways to improve their physical movement in order to promote healthy living. The purpose of this lesson is to expose patients to common physical limitations that impact physical activity. Participants will learn practical ways to adapt and manage aches and pains, and to minimize their effect on regular exercise. Patients will learn how to maintain good posture while sitting, walking, and lifting.  Balance Training and Fall Prevention  Clinical staff led group instruction and group discussion with PowerPoint presentation and patient guidebook. To enhance the learning environment the use of posters, models and videos may be added. At the conclusion of this workshop, patients will understand the importance of their sensorimotor skills (vision, proprioception, and the vestibular system) in maintaining their ability to balance as they age. Patients will apply a variety of balancing exercises that are appropriate for their current level of function. Patients will understand the common causes for poor balance, possible solutions to these problems, and ways to modify their physical environment in order to minimize their fall risk. The purpose of this lesson is to teach patients about the importance of maintaining balance as they age  and ways to minimize their risk of falling.  WORKSHOPS   Nutrition:  Fueling a Ship broker led group instruction and group discussion with PowerPoint presentation and patient guidebook. To enhance the learning environment the use of posters, models and videos may be added. Patients will review the foundational principles of the Pritikin Eating Plan and understand what constitutes a serving size in each of the food groups. Patients will also learn Pritikin-friendly foods that are better choices when away from home and review make-ahead meal and snack options. Calorie density will be reviewed and applied to three nutrition priorities: weight maintenance, weight loss, and weight gain. The purpose of this lesson is to reinforce (in a group setting) the key concepts around what patients are recommended to eat and how to apply these guidelines when away from home by planning and selecting Pritikin-friendly options. Patients will understand how calorie density may be adjusted for different weight management goals.  Mindful Eating  Clinical staff led group instruction and group discussion with PowerPoint presentation and patient guidebook. To enhance the learning environment the use of posters, models and videos may be added. Patients will briefly review the concepts of the Pritikin Eating Plan and the importance of low-calorie dense foods. The concept of mindful eating will be introduced as well as the importance of paying attention to internal hunger signals. Triggers for non-hunger eating and techniques for dealing with triggers will be explored. The purpose of this lesson is to provide patients with the opportunity to review the basic principles of the Pritikin Eating Plan, discuss the value of eating mindfully and how to measure internal cues of hunger and fullness using the Hunger Scale. Patients will also discuss reasons for non-hunger eating and learn strategies to use for controlling  emotional eating.  Targeting Your Nutrition Priorities Clinical staff led group instruction and group discussion with PowerPoint presentation and patient guidebook. To enhance the learning environment the use of posters, models and videos may be added. Patients will learn how to determine their genetic susceptibility to disease by reviewing their family history. Patients will gain insight into the importance of diet as part of an overall healthy lifestyle in mitigating the impact of genetics and other environmental insults. The purpose of this lesson is to provide patients with the opportunity to assess  their personal nutrition priorities by looking at their family history, their own health history and current risk factors. Patients will also be able to discuss ways of prioritizing and modifying the Pritikin Eating Plan for their highest risk areas  Menu  Clinical staff led group instruction and group discussion with PowerPoint presentation and patient guidebook. To enhance the learning environment the use of posters, models and videos may be added. Using menus brought in from [employer], or printed from [employer], patients will apply the Pritikin dining out guidelines that were presented in the [employer] video. Patients will also be able to practice these guidelines in a variety of provided scenarios. The purpose of this lesson is to provide patients with the opportunity to practice hands-on learning of the Pritikin Dining Out guidelines with actual menus and practice scenarios.  Label Reading Clinical staff led group instruction and group discussion with PowerPoint presentation and patient guidebook. To enhance the learning environment the use of posters, models and videos may be added. Patients will review and discuss the Pritikin label reading guidelines presented in [employer]'s Label Reading Educational series video. Using fool labels brought in from local grocery stores  and markets, patients will apply the label reading guidelines and determine if the packaged food meet the Pritikin guidelines. The purpose of this lesson is to provide patients with the opportunity to review, discuss, and practice hands-on learning of the Prit[facility]in Label Reading guidelines with actual packaged food labels. Cooking School  [facility]'s [employer] are designed to teach patients ways to prepare quick, simple, and affordable recipes at home. The importance of nutrition's role in chronic disease risk reduction is reflected in its emphasis in the overall Pritikin program. By learning how to prepare essential core Pritikin Eating Plan recipes, patients will increase control over what they eat; be able to customize the flavor of foods without the use of added salt, sugar, or fat; and improve the quality of the food they consume. By learning a set of core recipes which are easily assembled, quickly prepared, and affordable, patients are more likely to prepare more healthy foods at home. These workshops focus on convenient breakfasts, simple entres, side dishes, and desserts which can be prepared with minimal effort and are consistent with nutrition recommendations for cardiovascular risk reduction. Cooking [employer] are taught by a Armed forces logistics/support/administrative officer (RD) who has been trained by the [employer]. The chef or RD has a clear understanding of the importance of minimizing - if not completely eliminating - added fat, sugar, and sodium in recipes. Throughout the series of Cooking School Workshop sessions, patients will learn about healthy ingredients and efficient methods of cooking to build confidence in their capability to prepare    Cooking School weekly topics:  Adding Flavor- Sodium-Free  Fast and Healthy Breakfasts  Powerhouse Plant-Based Proteins  Satisfying Salads and Dressings  Simple Sides and Sauces  International Cuisine-Spotlight on the [employer]  Zones  Delicious Desserts  Savory Soups  [employer] - Meals in a Astronomer Appetizers and Snacks  Comforting Weekend Breakfasts  One-Pot Wonders   Fast Evening Meals  Landscape architect Your Pritikin Plate  WORKSHOPS   Healthy Mindset (Psychosocial):  Focused Goals, Sustainable Changes Clinical staff led group instruction and group discussion with PowerPoint presentation and patient guidebook. To enhance the learning environment the use of posters, models and videos may be added. Patients will be able to apply effective goal setting strategies to establish  at least one personal goal, and then take consistent, meaningful action toward that goal. They will learn to identify common barriers to achieving personal goals and develop strategies to overcome them. Patients will also gain an understanding of how our mind-set can impact our ability to achieve goals and the importance of cultivating a positive and growth-oriented mind-set. The purpose of this lesson is to provide patients with a deeper understanding of how to set and achieve personal goals, as well as the tools and strategies needed to overcome common obstacles which may arise along the way.  From Head to Heart: The Power of a Healthy Outlook  Clinical staff led group instruction and group discussion with PowerPoint presentation and patient guidebook. To enhance the learning environment the use of posters, models and videos may be added. Patients will be able to recognize and describe the impact of emotions and mood on physical health. They will discover the importance of self-care and explore self-care practices which may work for them. Patients will also learn how to utilize the 4 C's to cultivate a healthier outlook and better manage stress and challenges. The purpose of this lesson is to demonstrate to patients how a healthy outlook is an essential part of maintaining good health, especially as they continue their  cardiac rehab journey.  Healthy Sleep for a Healthy Heart Clinical staff led group instruction and group discussion with PowerPoint presentation and patient guidebook. To enhance the learning environment the use of posters, models and videos may be added. At the conclusion of this workshop, patients will be able to demonstrate knowledge of the importance of sleep to overall health, well-being, and quality of life. They will understand the symptoms of, and treatments for, common sleep disorders. Patients will also be able to identify daytime and nighttime behaviors which impact sleep, and they will be able to apply these tools to help manage sleep-related challenges. The purpose of this lesson is to provide patients with a general overview of sleep and outline the importance of quality sleep. Patients will learn about a few of the most common sleep disorders. Patients will also be introduced to the concept of "sleep hygiene," and discover ways to self-manage certain sleeping problems through simple daily behavior changes. Finally, the workshop will motivate patients by clarifying the links between quality sleep and their goals of heart-healthy living.   Recognizing and Reducing Stress Clinical staff led group instruction and group discussion with PowerPoint presentation and patient guidebook. To enhance the learning environment the use of posters, models and videos may be added. At the conclusion of this workshop, patients will be able to understand the types of stress reactions, differentiate between acute and chronic stress, and recognize the impact that chronic stress has on their health. They will also be able to apply different coping mechanisms, such as reframing negative self-talk. Patients will have the opportunity to practice a variety of stress management techniques, such as deep abdominal breathing, progressive muscle relaxation, and/or guided imagery.  The purpose of this lesson is to educate  patients on the role of stress in their lives and to provide healthy techniques for coping with it.  Learning Barriers/Preferences:  Learning Barriers/Preferences - 01/09/23 1207       Learning Barriers/Preferences   Learning Barriers Sight;Hearing;Exercise Concerns   Pt wears glasses, hearing aides and has H/O falls   Learning Preferences Audio;Computer/Internet;Group Instruction;Individual Instruction;Pictoral;Skilled Demonstration;Verbal Instruction;Video;Written Material             Education Topics:  Knowledge Questionnaire Score:  Knowledge Questionnaire Score - 01/09/23 1208       Knowledge Questionnaire Score   Pre Score 16/24             Core Components/Risk Factors/Patient Goals at Admission:  Personal Goals and Risk Factors at Admission - 01/09/23 1210       Core Components/Risk Factors/Patient Goals on Admission    Weight Management Yes;Obesity;Weight Loss    Intervention Weight Management: Provide education and appropriate resources to help participant work on and attain dietary goals.;Weight Management: Develop a combined nutrition and exercise program designed to reach desired caloric intake, while maintaining appropriate intake of nutrient and fiber, sodium and fats, and appropriate energy expenditure required for the weight goal.;Weight Management/Obesity: Establish reasonable short term and long term weight goals.;Obesity: Provide education and appropriate resources to help participant work on and attain dietary goals.    Expected Outcomes Short Term: Continue to assess and modify interventions until short term weight is achieved;Long Term: Adherence to nutrition and physical activity/exercise program aimed toward attainment of established weight goal;Weight Loss: Understanding of general recommendations for a balanced deficit meal plan, which promotes 1-2 lb weight loss per week and includes a negative energy balance of [phone] kcal/d;Understanding  recommendations for meals to include 15-35% energy as protein, 25-35% energy from fat, 35-60% energy from carbohydrates, less than 200mg  of dietary cholesterol, 20-35 gm of total fiber daily;Understanding of distribution of calorie intake throughout the day with the consumption of 4-5 meals/snacks    Heart Failure Yes    Intervention Provide a combined exercise and nutrition program that is supplemented with education, support and counseling about heart failure. Directed toward relieving symptoms such as shortness of breath, decreased exercise tolerance, and extremity edema.    Expected Outcomes Improve functional capacity of life;Short term: Attendance in program 2-3 days a week with increased exercise capacity. Reported lower sodium intake. Reported increased fruit and vegetable intake. Reports medication compliance.;Short term: Daily weights obtained and reported for increase. Utilizing diuretic protocols set by physician.;Long term: Adoption of self-care skills and reduction of barriers for early signs and symptoms recognition and intervention leading to self-care maintenance.    Hypertension Yes    Intervention Provide education on lifestyle modifcations including regular physical activity/exercise, weight management, moderate sodium restriction and increased consumption of fresh fruit, vegetables, and low fat dairy, alcohol moderation, and smoking cessation.;Monitor prescription use compliance.    Expected Outcomes Short Term: Continued assessment and intervention until BP is < 140/21mm HG in hypertensive participants. < 130/55mm HG in hypertensive participants with diabetes, heart failure or chronic kidney disease.;Long Term: Maintenance of blood pressure at goal levels.    Lipids Yes    Intervention Provide education and support for participant on nutrition & aerobic/resistive exercise along with prescribed medications to achieve LDL 70mg , HDL >40mg .    Expected Outcomes Short Term: Participant  states understanding of desired cholesterol values and is compliant with medications prescribed. Participant is following exercise prescription and nutrition guidelines.;Long Term: Cholesterol controlled with medications as prescribed, with individualized exercise RX and with personalized nutrition plan. Value goals: LDL < 70mg , HDL > 40 mg.    Stress Yes    Intervention Offer individual and/or small group education and counseling on adjustment to heart disease, stress management and health-related lifestyle change. Teach and support self-help strategies.;Refer participants experiencing significant psychosocial distress to appropriate mental health specialists for further evaluation and treatment. When possible, include family members and significant others in education/counseling sessions.    Expected Outcomes Short Term: Participant  demonstrates changes in health-related behavior, relaxation and other stress management skills, ability to obtain effective social support, and compliance with psychotropic medications if prescribed.;Long Term: Emotional wellbeing is indicated by absence of clinically significant psychosocial distress or social isolation.    Personal Goal Other Yes    Personal Goal Long and shor the same: get back into exercise and gain strength    Intervention will continue to monitor pt and progress workloads as tolerated without sign or symptom    Expected Outcomes Pt will achieve her goals             Core Components/Risk Factors/Patient Goals Review:   Goals and Risk Factor Review     Row Name 10/02/22 1546 10/15/22 1115           Core Components/Risk Factors/Patient Goals Review   Personal Goals Review Weight Management/Obesity;Heart Failure;Stress;Hypertension;Lipids Weight Management/Obesity;Heart Failure;Stress;Hypertension;Lipids      Review Jonni started intensive cardiac rehab on  10/02/22. Shanisha did well with exercise. Vital signs stable. Kamilla is somewhat  decondtioned. Jhoanna enjoyed her first day of exercise. Lariya started intensive cardiac rehab on  10/02/22. Geraldin is off to a good start to exercise. Vital signs stable. Corrisa has lost 4.6 kg since starting cardiac rehab      Expected Outcomes Nuzhat will continue to participate in intensive cardiac rehab for exercise, nutrition and lifestyle modifications Rashita will continue to participate in intensive cardiac rehab for exercise, nutrition and lifestyle modifications               Core Components/Risk Factors/Patient Goals at Discharge (Final Review):   Goals and Risk Factor Review - 10/15/22 1115       Core Components/Risk Factors/Patient Goals Review   Personal Goals Review Weight Management/Obesity;Heart Failure;Stress;Hypertension;Lipids    Review Vayla started intensive cardiac rehab on  10/02/22. Rodnika is off to a good start to exercise. Vital signs stable. 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 Armanda Magic medical director. Introduction to pritikin education program/ intensive cardiac rehab. Initial orientation packet reviewed with patient. 30 Day ITP Review. Merrillyn started intensive cardiac rehab on 10/02/22 and is off to a good start toexercise. Najee fell at home vaccuming at home yesterday 10/14/22 and will need to be evaluated by her primary care provider before returning to exercise Dr. Armanda Magic medical director. Introduction to pritikin education program/ intensive cardiac rehab. Initial orientation packet reviewed with patient.              Comments:  Participant previously attended orientation on 09/26/2022 but was discharged due to illness, fall at home and poor attendance. Today patient attended a new  orientation for the cardiac rehabilitation program on  01/09/2023  to perform initial intake and exercise walk test. Patient introduced to the [facility] Program education and orientation packet was reviewed. Completed 6-minute walk test, measurements, initial ITP, and exercise prescription. Vital signs stable. Telemetry-normal sinus rhythm, asymptomatic.    Service time was from 0930 to 1120.

## 2023-01-10 DIAGNOSIS — F419 Anxiety disorder, unspecified: Secondary | ICD-10-CM | POA: Diagnosis not present

## 2023-01-10 DIAGNOSIS — Z6841 Body Mass Index (BMI) 40.0 and over, adult: Secondary | ICD-10-CM | POA: Diagnosis not present

## 2023-01-10 DIAGNOSIS — M1A00X0 Idiopathic chronic gout, unspecified site, without tophus (tophi): Secondary | ICD-10-CM | POA: Diagnosis not present

## 2023-01-15 ENCOUNTER — Encounter (HOSPITAL_COMMUNITY): Payer: Medicare Other

## 2023-01-15 ENCOUNTER — Telehealth (HOSPITAL_COMMUNITY): Payer: Self-pay | Admitting: *Deleted

## 2023-01-17 ENCOUNTER — Other Ambulatory Visit (HOSPITAL_COMMUNITY): Payer: Self-pay

## 2023-01-17 ENCOUNTER — Encounter (HOSPITAL_COMMUNITY): Payer: Medicare Other

## 2023-01-17 NOTE — Telephone Encounter (Signed)
Per test claim [employer] is $104.99. Patient is in the initial benefit phase which is contributing to co-pays

## 2023-01-20 ENCOUNTER — Encounter (HOSPITAL_COMMUNITY): Payer: Medicare Other

## 2023-01-20 NOTE — Telephone Encounter (Signed)
Attempted to call pt but unable to reach. Left message to return call.  

## 2023-01-20 NOTE — Telephone Encounter (Signed)
Patient is returning phone call. Patient phone number is [phone].

## 2023-01-21 NOTE — Progress Notes (Signed)
Cardiac Individual Treatment Plan  Patient Details  Name: Brittany Jackson MRN: [id number] Date of Birth: 1940-11-25 Referring Provider:   Flowsheet Row INTENSIVE CARDIAC REHAB ORIENT from 01/09/2023 in [facility]  Referring Provider Dr. Marca Ancona, MD       Initial Encounter Date:  Flowsheet Row INTENSIVE CARDIAC REHAB ORIENT from 01/09/2023 in [facility]  Date 01/09/23       Visit Diagnosis: 01/08/22 Type 2 STEMI  Patient's Home Medications on Admission:  Current Outpatient Medications:    acetaminophen (TYLENOL) 500 MG tablet, Take 500-1,000 mg by mouth every 6 (six) hours as needed (pain.)., Disp: , Rfl:    albuterol (VENTOLIN HFA) 108 (90 Base) MCG/ACT inhaler, Inhale 2 puffs into the lungs every 6 (six) hours as needed for wheezing or shortness of breath., Disp: 8 g, Rfl: 6   amiodarone (PACERONE) 200 MG tablet, Take 0.5 tablets (100 mg total) by mouth daily., Disp: 45 tablet, Rfl: 3   apixaban (ELIQUIS) 2.5 MG TABS tablet, Take 1 tablet (2.5 mg total) by mouth 2 (two) times daily., Disp: 60 tablet, Rfl: 11   atorvastatin (LIPITOR) 80 MG tablet, TAKE ONE TABLET BY MOUTH DAILY, Disp: 30 tablet, Rfl: 5   empagliflozin (JARDIANCE) 10 MG TABS tablet, Take 1 tablet (10 mg total) by mouth daily before breakfast., Disp: 14 tablet, Rfl: 11   hydrocortisone cream 1 %, Apply topically as needed for itching., Disp: 28 g, Rfl: 0   Multiple Vitamins-Minerals (ADULT ONE DAILY GUMMIES PO), Take 1 tablet by mouth in the morning., Disp: , Rfl:    torsemide (DEMADEX) 20 MG tablet, Take 1 tablet (20 mg total) by mouth every other day., Disp: 45 tablet, Rfl: 3   Coenzyme Q10 (COQ10 PO), Take 1 capsule by mouth in the morning. (Patient not taking: Reported on 01/09/2023), Disp: , Rfl:    colchicine 0.6 MG tablet, Take 0.6 mg by mouth 2 (two) times daily as needed (onset of gout).  (Patient not taking: Reported on 01/09/2023), Disp: , Rfl:    Cyanocobalamin POWD, Take by mouth. (Patient not taking: Reported on 01/09/2023), Disp: , Rfl:    diazepam (VALIUM) 10 MG tablet, Take 1 tablet (10 mg total) by mouth 3 (three) times daily as needed for anxiety., Disp: 5 tablet, Rfl: 0   methylPREDNISolone (MEDROL DOSEPAK) 4 MG TBPK tablet, See admin instructions. (Patient not taking: Reported on 01/08/2023), Disp: , Rfl:    Red Yeast Rice 600 MG CAPS, Take 600 mg by mouth daily. (Patient not taking: Reported on 01/09/2023), Disp: , Rfl:    Tiotropium Bromide-Olodaterol (STIOLTO RESPIMAT) 2.5-2.5 MCG/ACT AERS, Inhale 2 puffs into the lungs daily., Disp: 1 each, Rfl: 0   umeclidinium-vilanterol (ANORO ELLIPTA) 62.5-25 MCG/ACT AEPB, Inhale 1 puff into the lungs daily. (Patient not taking: Reported on 01/09/2023), Disp: 60 each, Rfl: 5  Past Medical History: Past Medical History:  Diagnosis Date   CHF (congestive heart failure) (HCC)    Hypertension    Urticaria     Tobacco Use: Social History   Tobacco Use  Smoking Status Former   Packs/day: 1.00   Years: 60.00   Additional pack years: 0.00   Total pack years: 60.00   Types: Cigarettes   Start date: 53   Quit date: 01/08/2022   Years since quitting: 1.0  Smokeless Tobacco Never    Labs: Review Flowsheet  More data exists  Latest Ref Rng & Units 01/20/2022 01/21/2022 01/22/2022 03/19/2022 12/27/2022  Labs for ITP Cardiac and Pulmonary Rehab  Cholestrol 0 - 200 mg/dL - - - 161  096   LDL (calc) 0 - 99 mg/dL - - - 71  63   HDL-C >04 mg/dL - - - 56  74   Trlycerides <150 mg/dL - - - 540  981   Hemoglobin A1c 4.8 - 5.6 % - - - - 5.7   O2 Saturation % 78.5  87.6  58  47.9  - -    Capillary Blood Glucose: Lab Results  Component Value Date   GLUCAP 105 (H) 01/22/2022   GLUCAP 80 01/22/2022   GLUCAP 83 01/22/2022   GLUCAP 93 01/21/2022   GLUCAP 86 01/21/2022     Exercise Target Goals: Exercise Program  Goal: Individual exercise prescription set using results from initial 6 min walk test and THRR while considering  patient's activity barriers and safety.   Exercise Prescription Goal: Initial exercise prescription builds to 30-45 minutes a day of aerobic activity, 2-3 days per week.  Home exercise guidelines will be given to patient during program as part of exercise prescription that the participant will acknowledge.  Activity Barriers & Risk Stratification:  Activity Barriers & Cardiac Risk Stratification - 01/09/23 1159       Activity Barriers & Cardiac Risk Stratification   Activity Barriers Joint Problems;Deconditioning;History of Falls;Balance Concerns;Muscular Weakness;Decreased Ventricular Function    Cardiac Risk Stratification High             6 Minute Walk:  6 Minute Walk     Row Name 09/26/22 1555 01/09/23 1156       6 Minute Walk   Phase Initial Initial    Distance 960 feet 840 feet    Walk Time 6 minutes 6 minutes    # of Rest Breaks 1  2:27-3:50 SOB, SpO2 100% 2  Both standing rest breaks due to hip pain and fatigue 3.24-3.44 and 1.07-1.28    MPH 1.8 1.59    METS 1.2 0.69    RPE 13 12    Perceived Dyspnea  1 0    VO2 Peak 4.22 2.41    Symptoms Yes (comment) Yes (comment)    Comments SOB, resolved with rest. Chronic bilateral hip pain, left toe gout pain, and fatigue 3/10 pain    Resting HR 69 bpm 79 bpm    Resting BP 120/54 120/60    Resting Oxygen Saturation  96 % 98 %    Exercise Oxygen Saturation  during 6 min walk 97 % 99 %    Max Ex. HR 110 bpm 106 bpm    Max Ex. BP 168/52 150/74    2 Minute Post BP 124/52 134/64             Oxygen Initial Assessment:   Oxygen Re-Evaluation:   Oxygen Discharge (Final Oxygen Re-Evaluation):   Initial Exercise Prescription:  Initial Exercise Prescription - 01/09/23 1200       Date of Initial Exercise RX and Referring Provider   Date 01/09/23    Referring Provider Dr. Marca Ancona, MD    Expected  Discharge Date 03/21/23      NuStep   Level 1    SPM 80    Minutes 20    METs 1.7      Prescription Details   Frequency (times per week) 3    Duration Progress to 30 minutes of continuous aerobic without signs/symptoms of physical distress  Intensity   THRR 40-80% of Max Heartrate 54-109    Ratings of Perceived Exertion 11-13    Perceived Dyspnea 0-4      Progression   Progression Continue to progress workloads to maintain intensity without signs/symptoms of physical distress.      Resistance Training   Training Prescription Yes    Weight 2    Reps 10-15             Perform Capillary Blood Glucose checks as needed.  Exercise Prescription Changes:   Exercise Prescription Changes     Row Name 10/02/22 1615             Response to Exercise   Blood Pressure (Admit) 120/70       Blood Pressure (Exercise) 144/68       Blood Pressure (Exit) 108/80       Heart Rate (Admit) 69 bpm       Heart Rate (Exercise) 109 bpm       Heart Rate (Exit) 75 bpm       Rating of Perceived Exertion (Exercise) 13       Symptoms none       Comments Pt first day in teh [facility] ICR program       Duration Progress to 30 minutes of  aerobic without signs/symptoms of physical distress       Intensity THRR unchanged         Progression   Progression Continue to progress workloads to maintain intensity without signs/symptoms of physical distress.       Average METs 2.3         Resistance Training   Training Prescription No         NuStep   Level 2       SPM 95       Minutes 25       METs 2.3                Exercise Comments:   Exercise Comments     Row Name 10/02/22 1620 11/01/22 0851         Exercise Comments Pt first day in the CRP2 program, Pt tolerated exercise well with an average MET level of 2.3. Pt is learning her THRR, RPE and ExRx Pt has been absent. Will review goals and education upon her return               Exercise Goals and Review:    Exercise Goals     Row Name 09/26/22 1600 01/09/23 1202           Exercise Goals   Increase Physical Activity Yes Yes      Intervention Provide advice, education, support and counseling about physical activity/exercise needs.;Develop an individualized exercise prescription for aerobic and resistive training based on initial evaluation findings, risk stratification, comorbidities and participant's personal goals. Provide advice, education, support and counseling about physical activity/exercise needs.;Develop an individualized exercise prescription for aerobic and resistive training based on initial evaluation findings, risk stratification, comorbidities and participant's personal goals.      Expected Outcomes Short Term: Attend rehab on a regular basis to increase amount of physical activity.;Long Term: Exercising regularly at least 3-5 days a week.;Long Term: Add in home exercise to make exercise part of routine and to increase amount of physical activity. Short Term: Attend rehab on a regular basis to increase amount of physical activity.;Long Term: Exercising regularly at least 3-5 days a week.;Long Term: Add in home exercise to  make exercise part of routine and to increase amount of physical activity.      Increase Strength and Stamina Yes Yes      Intervention Provide advice, education, support and counseling about physical activity/exercise needs.;Develop an individualized exercise prescription for aerobic and resistive training based on initial evaluation findings, risk stratification, comorbidities and participant's personal goals. Provide advice, education, support and counseling about physical activity/exercise needs.;Develop an individualized exercise prescription for aerobic and resistive training based on initial evaluation findings, risk stratification, comorbidities and participant's personal goals.      Expected Outcomes Short Term: Increase workloads from initial exercise prescription  for resistance, speed, and METs.;Short Term: Perform resistance training exercises routinely during rehab and add in resistance training at home;Long Term: Improve cardiorespiratory fitness, muscular endurance and strength as measured by increased METs and functional capacity ( ) Short Term: Increase workloads from initial exercise prescription for resistance, speed, and METs.;Short Term: Perform resistance training exercises routinely during rehab and add in resistance training at home;Long Term: Improve cardiorespiratory fitness, muscular endurance and strength as measured by increased METs and functional capacity ( )      Able to understand and use rate of perceived exertion (RPE) scale Yes Yes      Intervention Provide education and explanation on how to use RPE scale Provide education and explanation on how to use RPE scale      Expected Outcomes Short Term: Able to use RPE daily in rehab to express subjective intensity level;Long Term:  Able to use RPE to guide intensity level when exercising independently Short Term: Able to use RPE daily in rehab to express subjective intensity level;Long Term:  Able to use RPE to guide intensity level when exercising independently      Able to understand and use Dyspnea scale Yes --      Intervention Provide education and explanation on how to use Dyspnea scale --      Expected Outcomes Short Term: Able to use Dyspnea scale daily in rehab to express subjective sense of shortness of breath during exertion;Long Term: Able to use Dyspnea scale to guide intensity level when exercising independently --      Knowledge and understanding of Target Heart Rate Range (THRR) Yes Yes      Intervention Provide education and explanation of THRR including how the numbers were predicted and where they are located for reference Provide education and explanation of THRR including how the numbers were predicted and where they are located for reference      Expected Outcomes  Short Term: Able to state/look up THRR;Long Term: Able to use THRR to govern intensity when exercising independently;Short Term: Able to use daily as guideline for intensity in rehab Short Term: Able to state/look up THRR;Long Term: Able to use THRR to govern intensity when exercising independently;Short Term: Able to use daily as guideline for intensity in rehab      Understanding of Exercise Prescription Yes Yes      Intervention Provide education, explanation, and written materials on patient's individual exercise prescription Provide education, explanation, and written materials on patient's individual exercise prescription      Expected Outcomes Short Term: Able to explain program exercise prescription;Long Term: Able to explain home exercise prescription to exercise independently Short Term: Able to explain program exercise prescription;Long Term: Able to explain home exercise prescription to exercise independently               Exercise Goals Re-Evaluation :  Exercise Goals Re-Evaluation  Row Name 10/02/22 1618             Exercise Goal Re-Evaluation   Exercise Goals Review Increase Physical Activity;Understanding of Exercise Prescription;Increase Strength and Stamina;Knowledge and understanding of Target Heart Rate Range (THRR);Able to understand and use rate of perceived exertion (RPE) scale       Comments Pt first day in the CRP2 program, Pt tolerated exercise well with an average MET level of 2.3. Pt is learning her THRR, RPE and ExRx       Expected Outcomes Will continue to monitor pt and progress workloads as tolerated without sign or symptom.                Discharge Exercise Prescription (Final Exercise Prescription Changes):  Exercise Prescription Changes - 10/02/22 1615       Response to Exercise   Blood Pressure (Admit) 120/70    Blood Pressure (Exercise) 144/68    Blood Pressure (Exit) 108/80    Heart Rate (Admit) 69 bpm    Heart Rate (Exercise) 109 bpm     Heart Rate (Exit) 75 bpm    Rating of Perceived Exertion (Exercise) 13    Symptoms none    Comments Pt first day in teh [facility] ICR program    Duration Progress to 30 minutes of  aerobic without signs/symptoms of physical distress    Intensity THRR unchanged      Progression   Progression Continue to progress workloads to maintain intensity without signs/symptoms of physical distress.    Average METs 2.3      Resistance Training   Training Prescription No      NuStep   Level 2    SPM 95    Minutes 25    METs 2.3             Nutrition:  Target Goals: Understanding of nutrition guidelines, daily intake of sodium 1500mg , cholesterol 200mg , calories 30% from fat and 7% or less from saturated fats, daily to have 5 or more servings of fruits and vegetables.  Biometrics:  Pre Biometrics - 01/09/23 1202       Pre Biometrics   Height 5\' 5"  (1.651 m)    Weight 113.1 kg    Waist Circumference 51.5 inches    Hip Circumference 57.5 inches    Waist to Hip Ratio 0.9 %    BMI (Calculated) 41.49    Triceps Skinfold 35 mm    Grip Strength 27 kg    Flexibility --   Did not attempt due to pain   Single Leg Stand 1.2 seconds              Nutrition Therapy Plan and Nutrition Goals:  Nutrition Therapy & Goals - 11/04/22 0912       Nutrition Therapy   Diet Heart healthy diet    Drug/Food Interactions Statins/Certain Fruits      Personal Nutrition Goals   Nutrition Goal Patient to identify strategies for reducing cardiovascular risk by attending the weekly [facility] education and nutrition series    Personal Goal #2 Patient to improve diet quality by using the plate method as a daily guide for meal planning to include lean protein/plant protein, fruits, vegetables, whole grains, nonfat dairy as part of well balanced diet    Personal Goal #3 Patient to reduce sodium intake to 1500mg  per day    Comments Patient has not attended cardiac rehab since 10/09/22. Goals have not  been reassessed due to attendance. Patient will continue to benefit  from participation in intensive cardiac rehab for nutrition, exercise, and lifestyle modfications as able.      Intervention Plan   Intervention Prescribe, educate and counsel regarding individualized specific dietary modifications aiming towards targeted core components such as weight, hypertension, lipid management, diabetes, heart failure and other comorbidities.;Nutrition handout(s) given to patient.    Expected Outcomes Short Term Goal: Understand basic principles of dietary content, such as calories, fat, sodium, cholesterol and nutrients.;Long Term Goal: Adherence to prescribed nutrition plan.             Nutrition Assessments:  MEDIFICTS Score Key: ?70 Need to make dietary changes  40-70 Heart Healthy Diet ? 40 Therapeutic Level Cholesterol Diet    Picture Your Plate Scores: <16 Unhealthy dietary pattern with much room for improvement. 41-50 Dietary pattern unlikely to meet recommendations for good health and room for improvement. 51-60 More healthful dietary pattern, with some room for improvement.  >60 Healthy dietary pattern, although there may be some specific behaviors that could be improved.    Nutrition Goals Re-Evaluation:  Nutrition Goals Re-Evaluation     Row Name 10/03/22 0853 11/04/22 0912           Goals   Current Weight 245 lb 9.5 oz (111.4 kg) --      Comment lipids WNL, Cr 1.99, GFR 24 No new labs; most recent labs  lipids WNL, Cr 1.99, GFR 24      Expected Outcome Brittany Jackson follows with outside nephrology; not able to view notes/labs. She continues regular follow-up with the heart failure clinic. Brittany Jackson will continue to benefit from particiation in intensive cardiac rehab for nutrition, exercise, and lifestyle modification. Patient has not attended cardiac rehab since 10/09/22. Goals have not been reassessed due to attendance. Patient will continue to benefit from participation in intensive  cardiac rehab for nutrition, exercise, and lifestyle modfications as able.               Nutrition Goals Re-Evaluation:  Nutrition Goals Re-Evaluation     Row Name 10/03/22 0853 11/04/22 0912           Goals   Current Weight 245 lb 9.5 oz (111.4 kg) --      Comment lipids WNL, Cr 1.99, GFR 24 No new labs; most recent labs  lipids WNL, Cr 1.99, GFR 24      Expected Outcome Brittany Jackson follows with outside nephrology; not able to view notes/labs. She continues regular follow-up with the heart failure clinic. Brittany Jackson will continue to benefit from particiation in intensive cardiac rehab for nutrition, exercise, and lifestyle modification. Patient has not attended cardiac rehab since 10/09/22. Goals have not been reassessed due to attendance. Patient will continue to benefit from participation in intensive cardiac rehab for nutrition, exercise, and lifestyle modfications as able.               Nutrition Goals Discharge (Final Nutrition Goals Re-Evaluation):  Nutrition Goals Re-Evaluation - 11/04/22 0912       Goals   Comment No new labs; most recent labs  lipids WNL, Cr 1.99, GFR 24    Expected Outcome Patient has not attended cardiac rehab since 10/09/22. Goals have not been reassessed due to attendance. Patient will continue to benefit from participation in intensive cardiac rehab for nutrition, exercise, and lifestyle modfications as able.             Psychosocial: Target Goals: Acknowledge presence or absence of significant depression and/or stress, maximize coping skills, provide positive support system. Participant is  able to verbalize types and ability to use techniques and skills needed for reducing stress and depression.  Initial Review & Psychosocial Screening:  Initial Psych Review & Screening - 01/09/23 1009       Initial Review   Current issues with Current Depression;Current Stress Concerns;History of Depression    Source of Stress Concerns Chronic Illness;Financial     Comments Pt states she has a hard time with her finances and her heart condition. Both of these issues are stresses for her, but she has friends and family who help. She has denighed counclining at this time      Family Dynamics   Good Support System? Yes    Comments friends and family      Barriers   Psychosocial barriers to participate in program The patient should benefit from training in stress management and relaxation.      Screening Interventions   Interventions Encouraged to exercise;To provide support and resources with identified psychosocial needs;Provide feedback about the scores to participant    Expected Outcomes Short Term goal: Identification and review with participant of any Quality of Life or Depression concerns found by scoring the questionnaire.   No counciling wanted            Quality of Life Scores:  Quality of Life - 01/09/23 1206       Quality of Life   Select Quality of Life      Quality of Life Scores   Health/Function Pre 19.85 %    Socioeconomic Pre 27.83 %    Psych/Spiritual Pre 27.43 %    Family Pre 12 %    GLOBAL Pre 22.52 %            Scores of 19 and below usually indicate a poorer quality of life in these areas.  A difference of  2-3 points is a clinically meaningful difference.  A difference of 2-3 points in the total score of the Quality of Life Index has been associated with significant improvement in overall quality of life, self-image, physical symptoms, and general health in studies assessing change in quality of life.  PHQ-9: Review Flowsheet       01/09/2023 09/26/2022  Depression screen PHQ 2/9  Decreased Interest 2 0  Down, Depressed, Hopeless 1 1  PHQ - 2 Score 3 1  Altered sleeping 0 0  Tired, decreased energy 2 2  Change in appetite 2 3  Feeling bad or failure about yourself  1 2  Trouble concentrating 0 0  Moving slowly or fidgety/restless 0 1  Suicidal thoughts 0 0  PHQ-9 Score 8 9  Difficult doing work/chores  Not difficult at all Not difficult at all   Interpretation of Total Score  Total Score Depression Severity:  1-4 = Minimal depression, 5-9 = Mild depression, 10-14 = Moderate depression, 15-19 = Moderately severe depression, 20-27 = Severe depression   Psychosocial Evaluation and Intervention:   Psychosocial Re-Evaluation:  Psychosocial Re-Evaluation     Row Name 10/02/22 1544 10/15/22 1114           Psychosocial Re-Evaluation   Current issues with Current Abuse or Neglect to Report;Current Anxiety/Panic;History of Depression Current Abuse or Neglect to Report;Current Anxiety/Panic;History of Depression      Comments Brittany Jackson denies being depressed on her first day of exercise but admits to having a history of depression Brittany Jackson has not voiced any increased concerns or stressors at intensive cardiac rehab      Expected Outcomes Brittany Jackson will have decreased  or controlled depression and anxiety upon completion of her first day of exercise Brittany Jackson will have decreased or controlled depression and anxiety upon completion of her first day of exercise      Interventions Stress management education;Relaxation education;Encouraged to attend Cardiac Rehabilitation for the exercise Stress management education;Relaxation education;Encouraged to attend Cardiac Rehabilitation for the exercise      Continue Psychosocial Services  No Follow up required No Follow up required               Psychosocial Discharge (Final Psychosocial Re-Evaluation):  Psychosocial Re-Evaluation - 10/15/22 1114       Psychosocial Re-Evaluation   Current issues with Current Abuse or Neglect to Report;Current Anxiety/Panic;History of Depression    Comments Brittany Jackson has not voiced any increased concerns or stressors at intensive cardiac rehab    Expected Outcomes Brittany Jackson will have decreased or controlled depression and anxiety upon completion of her first day of exercise    Interventions Stress management education;Relaxation  education;Encouraged to attend Cardiac Rehabilitation for the exercise    Continue Psychosocial Services  No Follow up required             Vocational Rehabilitation: Provide vocational rehab assistance to qualifying candidates.   Vocational Rehab Evaluation & Intervention:  Vocational Rehab - 01/09/23 1210       Initial Vocational Rehab Evaluation & Intervention   Assessment shows need for Vocational Rehabilitation No   Patient is retired     Art therapist   Comments --             Education: Education Goals: Education classes will be provided on a weekly basis, covering required topics. Participant will state understanding/return demonstration of topics presented.    Education     Row Name 10/02/22 1600     Education   Cardiac Education Topics [facility]   Customer service manager   Weekly Topic Comforting Weekend Breakfasts   Instruction Review Code 1- Verbalizes Understanding   Class Start Time 1400   Class Stop Time 1445   Class Time Calculation (min) 45 min    Row Name 10/07/22 1600     Education   Cardiac Education Topics Pritikin   Select Core Videos     Core Videos   Educator Exercise Physiologist   Select Exercise Education   Exercise Education Biomechanial Limitations   Instruction Review Code 1- Verbalizes Understanding   Class Start Time 1406   Class Stop Time 1442   Class Time Calculation (min) 36 min    Row Name 10/09/22 1600     Education   Cardiac Education Topics Pritikin   Customer service manager   Weekly Topic Fast Evening Meals   Instruction Review Code 1- Verbalizes Understanding   Class Start Time 1400   Class Stop Time 1445   Class Time Calculation (min) 45 min            Core Videos: Exercise    Move It!  Clinical staff conducted group or individual video education with verbal and written material and guidebook.   Patient learns the recommended Pritikin exercise program. Exercise with the goal of living a long, healthy life. Some of the health benefits of exercise include controlled diabetes, healthier blood pressure levels, improved cholesterol levels, improved heart and lung capacity, improved sleep, and better body composition. Everyone should speak with their doctor before starting or changing  an exercise routine.  Biomechanical Limitations Clinical staff conducted group or individual video education with verbal and written material and guidebook.  Patient learns how biomechanical limitations can impact exercise and how we can mitigate and possibly overcome limitations to have an impactful and balanced exercise routine.  Body Composition Clinical staff conducted group or individual video education with verbal and written material and guidebook.  Patient learns that body composition (ratio of muscle mass to fat mass) is a key component to assessing overall fitness, rather than body weight alone. Increased fat mass, especially visceral belly fat, can put [place] at increased risk for metabolic syndrome, type 2 diabetes, heart disease, and even death. It is recommended to combine diet and exercise (cardiovascular and resistance training) to improve your body composition. Seek guidance from your physician and exercise physiologist before implementing an exercise routine.  Exercise Action Plan Clinical staff conducted group or individual video education with verbal and written material and guidebook.  Patient learns the recommended strategies to achieve and enjoy long-term exercise adherence, including variety, self-motivation, self-efficacy, and positive decision making. Benefits of exercise include fitness, good health, weight management, more energy, better sleep, less stress, and overall well-being.  Medical   Heart Disease Risk Reduction Clinical staff conducted group or individual video education with verbal  and written material and guidebook.  Patient learns our heart is our most vital organ as it circulates oxygen, nutrients, white blood cells, and hormones throughout the entire body, and carries waste away. Data supports a plant-based eating plan like the Pritikin Program for its effectiveness in slowing progression of and reversing heart disease. The video provides a number of recommendations to address heart disease.   Metabolic Syndrome and Belly Fat  Clinical staff conducted group or individual video education with verbal and written material and guidebook.  Patient learns what metabolic syndrome is, how it leads to heart disease, and how one can reverse it and keep it from coming back. You have metabolic syndrome if you have 3 of the following 5 criteria: abdominal obesity, high blood pressure, high triglycerides, low HDL cholesterol, and high blood sugar.  Hypertension and Heart Disease Clinical staff conducted group or individual video education with verbal and written material and guidebook.  Patient learns that high blood pressure, or hypertension, is very common in the [place]. Hypertension is largely due to excessive salt intake, but other important risk factors include being overweight, physical inactivity, drinking too much alcohol, smoking, and not eating enough potassium from fruits and vegetables. High blood pressure is a leading risk factor for heart attack, stroke, congestive heart failure, dementia, kidney failure, and premature death. Long-term effects of excessive salt intake include stiffening of the arteries and thickening of heart muscle and organ damage. Recommendations include ways to reduce hypertension and the risk of heart disease.  Diseases of Our Time - Focusing on Diabetes Clinical staff conducted group or individual video education with verbal and written material and guidebook.  Patient learns why the best way to stop diseases of our time is prevention, through  food and other lifestyle changes. Medicine (such as prescription pills and surgeries) is often only a Band-Aid on the problem, not a long-term solution. Most common diseases of our time include obesity, type 2 diabetes, hypertension, heart disease, and cancer. The Pritikin Program is recommended and has been proven to help reduce, reverse, and/or prevent the damaging effects of metabolic syndrome.  Nutrition   Overview of the Pritikin Eating Plan  Clinical staff conducted group or  individual video education with verbal and written material and guidebook.  Patient learns about the Pritikin Eating Plan for disease risk reduction. The Pritikin Eating Plan emphasizes a wide variety of unrefined, minimally-processed carbohydrates, like fruits, vegetables, whole grains, and legumes. Go, Caution, and Stop food choices are explained. Plant-based and lean animal proteins are emphasized. Rationale provided for low sodium intake for blood pressure control, low added sugars for blood sugar stabilization, and low added fats and oils for coronary artery disease risk reduction and weight management.  Calorie Density  Clinical staff conducted group or individual video education with verbal and written material and guidebook.  Patient learns about calorie density and how it impacts the Pritikin Eating Plan. Knowing the characteristics of the food you choose will help you decide whether those foods will lead to weight gain or weight loss, and whether you want to consume more or less of them. Weight loss is usually a side effect of the Pritikin Eating Plan because of its focus on low calorie-dense foods.  Label Reading  Clinical staff conducted group or individual video education with verbal and written material and guidebook.  Patient learns about the Pritikin recommended label reading guidelines and corresponding recommendations regarding calorie density, added sugars, sodium content, and whole grains.  Dining Out -  Part 1  Clinical staff conducted group or individual video education with verbal and written material and guidebook.  Patient learns that restaurant meals can be sabotaging because they can be so high in calories, fat, sodium, and/or sugar. Patient learns recommended strategies on how to positively address this and avoid unhealthy pitfalls.  Facts on Fats  Clinical staff conducted group or individual video education with verbal and written material and guidebook.  Patient learns that lifestyle modifications can be just as effective, if not more so, as many medications for lowering your risk of heart disease. A Pritikin lifestyle can help to reduce your risk of inflammation and atherosclerosis (cholesterol build-up, or plaque, in the artery walls). Lifestyle interventions such as dietary choices and physical activity address the cause of atherosclerosis. A review of the types of fats and their impact on blood cholesterol levels, along with dietary recommendations to reduce fat intake is also included.  Nutrition Action Plan  Clinical staff conducted group or individual video education with verbal and written material and guidebook.  Patient learns how to incorporate Pritikin recommendations into their lifestyle. Recommendations include planning and keeping personal health goals in mind as an important part of their success.  Healthy Mind-Set    Healthy Minds, Bodies, Hearts  Clinical staff conducted group or individual video education with verbal and written material and guidebook.  Patient learns how to identify when they are stressed. Video will discuss the impact of that stress, as well as the many benefits of stress management. Patient will also be introduced to stress management techniques. The way we think, act, and feel has an impact on our hearts.  How Our Thoughts Can Heal Our Hearts  Clinical staff conducted group or individual video education with verbal and written material and  guidebook.  Patient learns that negative thoughts can cause depression and anxiety. This can result in negative lifestyle behavior and serious health problems. Cognitive behavioral therapy is an effective method to help control our thoughts in order to change and improve our emotional outlook.  Additional Videos:  Exercise    Improving Performance  Clinical staff conducted group or individual video education with verbal and written material and guidebook.  Patient learns  to use a non-linear approach by alternating intensity levels and lengths of time spent exercising to help burn more calories and lose more body fat. Cardiovascular exercise helps improve heart health, metabolism, hormonal balance, blood sugar control, and recovery from fatigue. Resistance training improves strength, endurance, balance, coordination, reaction time, metabolism, and muscle mass. Flexibility exercise improves circulation, posture, and balance. Seek guidance from your physician and exercise physiologist before implementing an exercise routine and learn your capabilities and proper form for all exercise.  Introduction to Yoga  Clinical staff conducted group or individual video education with verbal and written material and guidebook.  Patient learns about yoga, a discipline of the coming together of mind, breath, and body. The benefits of yoga include improved flexibility, improved range of motion, better posture and core strength, increased lung function, weight loss, and positive self-image. Yoga's heart health benefits include lowered blood pressure, healthier heart rate, decreased cholesterol and triglyceride levels, improved immune function, and reduced stress. Seek guidance from your physician and exercise physiologist before implementing an exercise routine and learn your capabilities and proper form for all exercise.  Medical   Aging: Enhancing Your Quality of Life  Clinical staff conducted group or individual  video education with verbal and written material and guidebook.  Patient learns key strategies and recommendations to stay in good physical health and enhance quality of life, such as prevention strategies, having an advocate, securing a Health Care Proxy and Power of Attorney, and keeping a list of medications and system for tracking them. It also discusses how to avoid risk for bone loss.  Biology of Weight Control  Clinical staff conducted group or individual video education with verbal and written material and guidebook.  Patient learns that weight gain occurs because we consume more calories than we burn (eating more, moving less). Even if your body weight is normal, you may have higher ratios of fat compared to muscle mass. Too much body fat puts you at increased risk for cardiovascular disease, heart attack, stroke, type 2 diabetes, and obesity-related cancers. In addition to exercise, following the Pritikin Eating Plan can help reduce your risk.  Decoding Lab Results  Clinical staff conducted group or individual video education with verbal and written material and guidebook.  Patient learns that lab test reflects one measurement whose values change over time and are influenced by many factors, including medication, stress, sleep, exercise, food, hydration, pre-existing medical conditions, and more. It is recommended to use the knowledge from this video to become more involved with your lab results and evaluate your numbers to speak with your doctor.   Diseases of Our Time - Overview  Clinical staff conducted group or individual video education with verbal and written material and guidebook.  Patient learns that according to the CDC, 50% to 70% of chronic diseases (such as obesity, type 2 diabetes, elevated lipids, hypertension, and heart disease) are avoidable through lifestyle improvements including healthier food choices, listening to satiety cues, and increased physical activity.  Sleep  Disorders Clinical staff conducted group or individual video education with verbal and written material and guidebook.  Patient learns how good quality and duration of sleep are important to overall health and well-being. Patient also learns about sleep disorders and how they impact health along with recommendations to address them, including discussing with a physician.  Nutrition  Dining Out - Part 2 Clinical staff conducted group or individual video education with verbal and written material and guidebook.  Patient learns how to plan ahead and communicate  in order to maximize their dining experience in a healthy and nutritious manner. Included are recommended food choices based on the type of restaurant the patient is visiting.   Fueling a Banker conducted group or individual video education with verbal and written material and guidebook.  There is a strong connection between our food choices and our health. Diseases like obesity and type 2 diabetes are very prevalent and are in large-part due to lifestyle choices. The Pritikin Eating Plan provides plenty of food and hunger-curbing satisfaction. It is easy to follow, affordable, and helps reduce health risks.  Menu Workshop  Clinical staff conducted group or individual video education with verbal and written material and guidebook.  Patient learns that restaurant meals can sabotage health goals because they are often packed with calories, fat, sodium, and sugar. Recommendations include strategies to plan ahead and to communicate with the manager, chef, or server to help order a healthier meal.  Planning Your Eating Strategy  Clinical staff conducted group or individual video education with verbal and written material and guidebook.  Patient learns about the Pritikin Eating Plan and its benefit of reducing the risk of disease. The Pritikin Eating Plan does not focus on calories. Instead, it emphasizes high-quality,  nutrient-rich foods. By knowing the characteristics of the foods, we choose, we can determine their calorie density and make informed decisions.  Targeting Your Nutrition Priorities  Clinical staff conducted group or individual video education with verbal and written material and guidebook.  Patient learns that lifestyle habits have a tremendous impact on disease risk and progression. This video provides eating and physical activity recommendations based on your personal health goals, such as reducing LDL cholesterol, losing weight, preventing or controlling type 2 diabetes, and reducing high blood pressure.  Vitamins and Minerals  Clinical staff conducted group or individual video education with verbal and written material and guidebook.  Patient learns different ways to obtain key vitamins and minerals, including through a recommended healthy diet. It is important to discuss all supplements you take with your doctor.   Healthy Mind-Set    Smoking Cessation  Clinical staff conducted group or individual video education with verbal and written material and guidebook.  Patient learns that cigarette smoking and tobacco addiction pose a serious health risk which affects millions of people. Stopping smoking will significantly reduce the risk of heart disease, lung disease, and many forms of cancer. Recommended strategies for quitting are covered, including working with your doctor to develop a successful plan.  Culinary   Becoming a Set designer conducted group or individual video education with verbal and written material and guidebook.  Patient learns that cooking at home can be healthy, cost-effective, quick, and puts them in control. Keys to cooking healthy recipes will include looking at your recipe, assessing your equipment needs, planning ahead, making it simple, choosing cost-effective seasonal ingredients, and limiting the use of added fats, salts, and sugars.  Cooking -  Breakfast and Snacks  Clinical staff conducted group or individual video education with verbal and written material and guidebook.  Patient learns how important breakfast is to satiety and nutrition through the entire day. Recommendations include key foods to eat during breakfast to help stabilize blood sugar levels and to prevent overeating at meals later in the day. Planning ahead is also a key component.  Cooking - Educational psychologist conducted group or individual video education with verbal and written material and guidebook.  Patient learns  eating strategies to improve overall health, including an approach to cook more at home. Recommendations include thinking of animal protein as a side on your plate rather than center stage and focusing instead on lower calorie dense options like vegetables, fruits, whole grains, and plant-based proteins, such as beans. Making sauces in large quantities to freeze for later and leaving the skin on your vegetables are also recommended to maximize your experience.  Cooking - Healthy Salads and Dressing Clinical staff conducted group or individual video education with verbal and written material and guidebook.  Patient learns that vegetables, fruits, whole grains, and legumes are the foundations of the Pritikin Eating Plan. Recommendations include how to incorporate each of these in flavorful and healthy salads, and how to create homemade salad dressings. Proper handling of ingredients is also covered. Cooking - Soups and [employer] - Soups and Desserts Clinical staff conducted group or individual video education with verbal and written material and guidebook.  Patient learns that Pritikin soups and desserts make for easy, nutritious, and delicious snacks and meal components that are low in sodium, fat, sugar, and calorie density, while high in vitamins, minerals, and filling fiber. Recommendations include simple and healthy ideas for soups and  desserts.   Overview     The Pritikin Solution Program Overview Clinical staff conducted group or individual video education with verbal and written material and guidebook.  Patient learns that the results of the Pritikin Program have been documented in more than 100 articles published in peer-reviewed journals, and the benefits include reducing risk factors for (and, in some cases, even reversing) high cholesterol, high blood pressure, type 2 diabetes, obesity, and more! An overview of the three key pillars of the Pritikin Program will be covered: eating well, doing regular exercise, and having a healthy mind-set.  WORKSHOPS  Exercise: Exercise Basics: Building Your Action Plan Clinical staff led group instruction and group discussion with PowerPoint presentation and patient guidebook. To enhance the learning environment the use of posters, models and videos may be added. At the conclusion of this workshop, patients will comprehend the difference between physical activity and exercise, as well as the benefits of incorporating both, into their routine. Patients will understand the FITT (Frequency, Intensity, Time, and Type) principle and how to use it to build an exercise action plan. In addition, safety concerns and other considerations for exercise and cardiac rehab will be addressed by the presenter. The purpose of this lesson is to promote a comprehensive and effective weekly exercise routine in order to improve patients' overall level of fitness.   Managing Heart Disease: Your Path to a Healthier Heart Clinical staff led group instruction and group discussion with PowerPoint presentation and patient guidebook. To enhance the learning environment the use of posters, models and videos may be added.At the conclusion of this workshop, patients will understand the anatomy and physiology of the heart. Additionally, they will understand how Pritikin's three pillars impact the risk factors, the  progression, and the management of heart disease.  The purpose of this lesson is to provide a high-level overview of the heart, heart disease, and how the Pritikin lifestyle positively impacts risk factors.  Exercise Biomechanics Clinical staff led group instruction and group discussion with PowerPoint presentation and patient guidebook. To enhance the learning environment the use of posters, models and videos may be added. Patients will learn how the structural parts of their bodies function and how these functions impact their daily activities, movement, and exercise. Patients will learn how  to promote a neutral spine, learn how to manage pain, and identify ways to improve their physical movement in order to promote healthy living. The purpose of this lesson is to expose patients to common physical limitations that impact physical activity. Participants will learn practical ways to adapt and manage aches and pains, and to minimize their effect on regular exercise. Patients will learn how to maintain good posture while sitting, walking, and lifting.  Balance Training and Fall Prevention  Clinical staff led group instruction and group discussion with PowerPoint presentation and patient guidebook. To enhance the learning environment the use of posters, models and videos may be added. At the conclusion of this workshop, patients will understand the importance of their sensorimotor skills (vision, proprioception, and the vestibular system) in maintaining their ability to balance as they age. Patients will apply a variety of balancing exercises that are appropriate for their current level of function. Patients will understand the common causes for poor balance, possible solutions to these problems, and ways to modify their physical environment in order to minimize their fall risk. The purpose of this lesson is to teach patients about the importance of maintaining balance as they age and ways to  minimize their risk of falling.  WORKSHOPS   Nutrition:  Fueling a Ship broker led group instruction and group discussion with PowerPoint presentation and patient guidebook. To enhance the learning environment the use of posters, models and videos may be added. Patients will review the foundational principles of the Pritikin Eating Plan and understand what constitutes a serving size in each of the food groups. Patients will also learn Pritikin-friendly foods that are better choices when away from home and review make-ahead meal and snack options. Calorie density will be reviewed and applied to three nutrition priorities: weight maintenance, weight loss, and weight gain. The purpose of this lesson is to reinforce (in a group setting) the key concepts around what patients are recommended to eat and how to apply these guidelines when away from home by planning and selecting Pritikin-friendly options. Patients will understand how calorie density may be adjusted for different weight management goals.  Mindful Eating  Clinical staff led group instruction and group discussion with PowerPoint presentation and patient guidebook. To enhance the learning environment the use of posters, models and videos may be added. Patients will briefly review the concepts of the Pritikin Eating Plan and the importance of low-calorie dense foods. The concept of mindful eating will be introduced as well as the importance of paying attention to internal hunger signals. Triggers for non-hunger eating and techniques for dealing with triggers will be explored. The purpose of this lesson is to provide patients with the opportunity to review the basic principles of the Pritikin Eating Plan, discuss the value of eating mindfully and how to measure internal cues of hunger and fullness using the Hunger Scale. Patients will also discuss reasons for non-hunger eating and learn strategies to use for controlling emotional  eating.  Targeting Your Nutrition Priorities Clinical staff led group instruction and group discussion with PowerPoint presentation and patient guidebook. To enhance the learning environment the use of posters, models and videos may be added. Patients will learn how to determine their genetic susceptibility to disease by reviewing their family history. Patients will gain insight into the importance of diet as part of an overall healthy lifestyle in mitigating the impact of genetics and other environmental insults. The purpose of this lesson is to provide patients with the opportunity to assess  their personal nutrition priorities by looking at their family history, their own health history and current risk factors. Patients will also be able to discuss ways of prioritizing and modifying the Pritikin Eating Plan for their highest risk areas  Menu  Clinical staff led group instruction and group discussion with PowerPoint presentation and patient guidebook. To enhance the learning environment the use of posters, models and videos may be added. Using menus brought in from [employer], or printed from [employer], patients will apply the Pritikin dining out guidelines that were presented in the [employer] video. Patients will also be able to practice these guidelines in a variety of provided scenarios. The purpose of this lesson is to provide patients with the opportunity to practice hands-on learning of the Pritikin Dining Out guidelines with actual menus and practice scenarios.  Label Reading Clinical staff led group instruction and group discussion with PowerPoint presentation and patient guidebook. To enhance the learning environment the use of posters, models and videos may be added. Patients will review and discuss the Pritikin label reading guidelines presented in Pritikin's Label Reading Educational series video. Using fool labels brought in from local grocery stores and  markets, patients will apply the label reading guidelines and determine if the packaged food meet the Pritikin guidelines. The purpose of this lesson is to provide patients with the opportunity to review, discuss, and practice hands-on learning of the Pritikin Label Reading guidelines with actual packaged food labels. Cooking School  Pritikin's [employer] are designed to teach patients ways to prepare quick, simple, and affordable recipes at home. The importance of nutrition's role in chronic disease risk reduction is reflected in its emphasis in the overall Pritikin program. By learning how to prepare essential core Pritikin Eating Plan recipes, patients will increase control over what they eat; be able to customize the flavor of foods without the use of added salt, sugar, or fat; and improve the quality of the food they consume. By learning a set of core recipes which are easily assembled, quickly prepared, and affordable, patients are more likely to prepare more healthy foods at home. These workshops focus on convenient breakfasts, simple entres, side dishes, and desserts which can be prepared with minimal effort and are consistent with nutrition recommendations for cardiovascular risk reduction. Cooking [employer] are taught by a Armed forces logistics/support/administrative officer (RD) who has been trained by the [employer]. The chef or RD has a clear understanding of the importance of minimizing - if not completely eliminating - added fat, sugar, and sodium in recipes. Throughout the series of Cooking School Workshop sessions, patients will learn about healthy ingredients and efficient methods of cooking to build confidence in their capability to prepare    Cooking School weekly topics:  Adding Flavor- Sodium-Free  Fast and Healthy Breakfasts  Powerhouse Plant-Based Proteins  Satisfying Salads and Dressings  Simple Sides and Sauces  International Cuisine-Spotlight on the [employer]  Zones  Delicious Desserts  Savory Soups  [employer] - Meals in a Astronomer Appetizers and Snacks  Comforting Weekend Breakfasts  One-Pot Wonders   Fast Evening Meals  Landscape architect Your Pritikin Plate  WORKSHOPS   Healthy Mindset (Psychosocial):  Focused Goals, Sustainable Changes Clinical staff led group instruction and group discussion with PowerPoint presentation and patient guidebook. To enhance the learning environment the use of posters, models and videos may be added. Patients will be able to apply effective goal setting strategies to establish  at least one personal goal, and then take consistent, meaningful action toward that goal. They will learn to identify common barriers to achieving personal goals and develop strategies to overcome them. Patients will also gain an understanding of how our mind-set can impact our ability to achieve goals and the importance of cultivating a positive and growth-oriented mind-set. The purpose of this lesson is to provide patients with a deeper understanding of how to set and achieve personal goals, as well as the tools and strategies needed to overcome common obstacles which may arise along the way.  From Head to Heart: The Power of a Healthy Outlook  Clinical staff led group instruction and group discussion with PowerPoint presentation and patient guidebook. To enhance the learning environment the use of posters, models and videos may be added. Patients will be able to recognize and describe the impact of emotions and mood on physical health. They will discover the importance of self-care and explore self-care practices which may work for them. Patients will also learn how to utilize the 4 C's to cultivate a healthier outlook and better manage stress and challenges. The purpose of this lesson is to demonstrate to patients how a healthy outlook is an essential part of maintaining good health, especially as they continue their  cardiac rehab journey.  Healthy Sleep for a Healthy Heart Clinical staff led group instruction and group discussion with PowerPoint presentation and patient guidebook. To enhance the learning environment the use of posters, models and videos may be added. At the conclusion of this workshop, patients will be able to demonstrate knowledge of the importance of sleep to overall health, well-being, and quality of life. They will understand the symptoms of, and treatments for, common sleep disorders. Patients will also be able to identify daytime and nighttime behaviors which impact sleep, and they will be able to apply these tools to help manage sleep-related challenges. The purpose of this lesson is to provide patients with a general overview of sleep and outline the importance of quality sleep. Patients will learn about a few of the most common sleep disorders. Patients will also be introduced to the concept of "sleep hygiene," and discover ways to self-manage certain sleeping problems through simple daily behavior changes. Finally, the workshop will motivate patients by clarifying the links between quality sleep and their goals of heart-healthy living.   Recognizing and Reducing Stress Clinical staff led group instruction and group discussion with PowerPoint presentation and patient guidebook. To enhance the learning environment the use of posters, models and videos may be added. At the conclusion of this workshop, patients will be able to understand the types of stress reactions, differentiate between acute and chronic stress, and recognize the impact that chronic stress has on their health. They will also be able to apply different coping mechanisms, such as reframing negative self-talk. Patients will have the opportunity to practice a variety of stress management techniques, such as deep abdominal breathing, progressive muscle relaxation, and/or guided imagery.  The purpose of this lesson is to educate  patients on the role of stress in their lives and to provide healthy techniques for coping with it.  Learning Barriers/Preferences:  Learning Barriers/Preferences - 01/09/23 1207       Learning Barriers/Preferences   Learning Barriers Sight;Hearing;Exercise Concerns   Pt wears glasses, hearing aides and has H/O falls   Learning Preferences Audio;Computer/Internet;Group Instruction;Individual Instruction;Pictoral;Skilled Demonstration;Verbal Instruction;Video;Written Material             Education Topics:  Knowledge Questionnaire Score:  Knowledge Questionnaire Score - 01/09/23 1208       Knowledge Questionnaire Score   Pre Score 16/24             Core Components/Risk Factors/Patient Goals at Admission:  Personal Goals and Risk Factors at Admission - 01/09/23 1210       Core Components/Risk Factors/Patient Goals on Admission    Weight Management Yes;Obesity;Weight Loss    Intervention Weight Management: Provide education and appropriate resources to help participant work on and attain dietary goals.;Weight Management: Develop a combined nutrition and exercise program designed to reach desired caloric intake, while maintaining appropriate intake of nutrient and fiber, sodium and fats, and appropriate energy expenditure required for the weight goal.;Weight Management/Obesity: Establish reasonable short term and long term weight goals.;Obesity: Provide education and appropriate resources to help participant work on and attain dietary goals.    Expected Outcomes Short Term: Continue to assess and modify interventions until short term weight is achieved;Long Term: Adherence to nutrition and physical activity/exercise program aimed toward attainment of established weight goal;Weight Loss: Understanding of general recommendations for a balanced deficit meal plan, which promotes 1-2 lb weight loss per week and includes a negative energy balance of [phone] kcal/d;Understanding  recommendations for meals to include 15-35% energy as protein, 25-35% energy from fat, 35-60% energy from carbohydrates, less than 200mg  of dietary cholesterol, 20-35 gm of total fiber daily;Understanding of distribution of calorie intake throughout the day with the consumption of 4-5 meals/snacks    Heart Failure Yes    Intervention Provide a combined exercise and nutrition program that is supplemented with education, support and counseling about heart failure. Directed toward relieving symptoms such as shortness of breath, decreased exercise tolerance, and extremity edema.    Expected Outcomes Improve functional capacity of life;Short term: Attendance in program 2-3 days a week with increased exercise capacity. Reported lower sodium intake. Reported increased fruit and vegetable intake. Reports medication compliance.;Short term: Daily weights obtained and reported for increase. Utilizing diuretic protocols set by physician.;Long term: Adoption of self-care skills and reduction of barriers for early signs and symptoms recognition and intervention leading to self-care maintenance.    Hypertension Yes    Intervention Provide education on lifestyle modifcations including regular physical activity/exercise, weight management, moderate sodium restriction and increased consumption of fresh fruit, vegetables, and low fat dairy, alcohol moderation, and smoking cessation.;Monitor prescription use compliance.    Expected Outcomes Short Term: Continued assessment and intervention until BP is < 140/32mm HG in hypertensive participants. < 130/37mm HG in hypertensive participants with diabetes, heart failure or chronic kidney disease.;Long Term: Maintenance of blood pressure at goal levels.    Lipids Yes    Intervention Provide education and support for participant on nutrition & aerobic/resistive exercise along with prescribed medications to achieve LDL 70mg , HDL >40mg .    Expected Outcomes Short Term: Participant  states understanding of desired cholesterol values and is compliant with medications prescribed. Participant is following exercise prescription and nutrition guidelines.;Long Term: Cholesterol controlled with medications as prescribed, with individualized exercise RX and with personalized nutrition plan. Value goals: LDL < 70mg , HDL > 40 mg.    Stress Yes    Intervention Offer individual and/or small group education and counseling on adjustment to heart disease, stress management and health-related lifestyle change. Teach and support self-help strategies.;Refer participants experiencing significant psychosocial distress to appropriate mental health specialists for further evaluation and treatment. When possible, include family members and significant others in education/counseling sessions.    Expected Outcomes Short Term: Participant  demonstrates changes in health-related behavior, relaxation and other stress management skills, ability to obtain effective social support, and compliance with psychotropic medications if prescribed.;Long Term: Emotional wellbeing is indicated by absence of clinically significant psychosocial distress or social isolation.    Personal Goal Other Yes    Personal Goal Long and shor the same: get back into exercise and gain strength    Intervention will continue to monitor pt and progress workloads as tolerated without sign or symptom    Expected Outcomes Pt will achieve her goals             Core Components/Risk Factors/Patient Goals Review:   Goals and Risk Factor Review     Row Name 10/02/22 1546 10/15/22 1115           Core Components/Risk Factors/Patient Goals Review   Personal Goals Review Weight Management/Obesity;Heart Failure;Stress;Hypertension;Lipids Weight Management/Obesity;Heart Failure;Stress;Hypertension;Lipids      Review Zakaria started intensive cardiac rehab on  10/02/22. Jette did well with exercise. Vital signs stable. Dazah is somewhat  decondtioned. Moises enjoyed her first day of exercise. Fawna started intensive cardiac rehab on  10/02/22. Ernestine is off to a good start to exercise. Vital signs stable. Connor has lost 4.6 kg since starting cardiac rehab      Expected Outcomes Rubee will continue to participate in intensive cardiac rehab for exercise, nutrition and lifestyle modifications Lachlan will continue to participate in intensive cardiac rehab for exercise, nutrition and lifestyle modifications               Core Components/Risk Factors/Patient Goals at Discharge (Final Review):   Goals and Risk Factor Review - 10/15/22 1115       Core Components/Risk Factors/Patient Goals Review   Personal Goals Review Weight Management/Obesity;Heart Failure;Stress;Hypertension;Lipids    Review Robinette started intensive cardiac rehab on  10/02/22. Dream is off to a good start to exercise. Vital signs stable. Nataija has lost 4.6 kg since starting cardiac rehab    Expected Outcomes Leeyah will continue to participate in intensive cardiac rehab for exercise, nutrition and lifestyle modifications             ITP Comments:  ITP Comments     Row Name 09/26/22 1424 10/15/22 1112 01/09/23 1155 01/21/23 1701     ITP Comments Dr. Armanda Magic medical director. Introduction to pritikin education program/ intensive cardiac rehab. Initial orientation packet reviewed with patient. 30 Day ITP Review. Sherlyn started intensive cardiac rehab on 10/02/22 and is off to a good start toexercise. Kilah fell at home vaccuming at home yesterday 10/14/22 and will need to be evaluated by her primary care provider before returning to exercise Dr. Armanda Magic medical director. Introduction to pritikin education program/ intensive cardiac rehab. Initial orientation packet reviewed with patient. 30 Day ITP Reivew. Pavneet hopes to start intensive cardiac rehab on 01/22/23 as she has been out with gout.             Comments: See ITP comments.Thayer Headings RN BSN

## 2023-01-22 ENCOUNTER — Encounter (HOSPITAL_COMMUNITY)
Admission: RE | Admit: 2023-01-22 | Discharge: 2023-01-22 | Disposition: A | Discharge status: Home / Self Care | Payer: Medicare Other | Source: Ambulatory Visit | Attending: Cardiology | Admitting: Cardiology

## 2023-01-22 DIAGNOSIS — I5032 Chronic diastolic (congestive) heart failure: Secondary | ICD-10-CM | POA: Diagnosis not present

## 2023-01-22 DIAGNOSIS — I213 ST elevation (STEMI) myocardial infarction of unspecified site: Secondary | ICD-10-CM | POA: Insufficient documentation

## 2023-01-22 NOTE — Telephone Encounter (Signed)
Due to multiple attempts trying to reach pt with not being able to do so, per protocol encounter will be closed.

## 2023-01-22 NOTE — Progress Notes (Signed)
Daily Session Note  Patient Details  Name: TYENA DUHE MRN: [id number] Date of Birth: 1940/12/06 Referring Provider:   Flowsheet Row INTENSIVE CARDIAC REHAB ORIENT from 01/09/2023 in [facility]  Referring Provider Dr. Marca Ancona, MD       Encounter Date: 01/22/2023  Check In:  Session Check In - 01/22/23 1526       Check-In   Supervising physician immediately available to respond to emergencies [facility] MD immediately available    Physician(s) Carlos Levering, NP    Location [facility]    Staff Present Hughie Closs BS, ACSM-CEP, Exercise Physiologist;Bailey Wallace Cullens, MS, Exercise Physiologist;David Manus Gunning, MS, ACSM-CEP, CCRP, Exercise Physiologist;Johnny Hale Bogus, MS, Exercise Physiologist;Olinty Peggye Pitt, MS, ACSM-CEP, Exercise Physiologist;Casey Melany Guernsey, RN, MHA    Virtual Visit No    Medication changes reported     No    Fall or balance concerns reported    No    Tobacco Cessation No Change    Warm-up and Cool-down Performed as group-led instruction    Resistance Training Performed No    VAD Patient? No    PAD/SET Patient? No      Pain Assessment   Currently in Pain? No/denies    Pain Score 0-No pain    Multiple Pain Sites No             Capillary Blood Glucose: No results found for this or any previous visit (from the past 24 hour(s)).   Exercise Prescription Changes - 01/22/23 1634       Response to Exercise   Blood Pressure (Admit) 144/84    Blood Pressure (Exercise) 184/74   DEC WL recheck 180/56   Blood Pressure (Exit) 110/58    Heart Rate (Admit) 65 bpm    Heart Rate (Exercise) 105 bpm    Heart Rate (Exit) 74 bpm    Rating of Perceived Exertion (Exercise) 13    Perceived Dyspnea (Exercise) 0    Symptoms elevated BP with exercise    Comments Pt first day in teh Pritikin ICR program    Duration Progress to 30 minutes of  aerobic without signs/symptoms of  physical distress    Intensity THRR unchanged      Progression   Progression Continue to progress workloads to maintain intensity without signs/symptoms of physical distress.    Average METs 1.8      Resistance Training   Training Prescription Yes    Weight 2    Reps 10-15    Time 10 Minutes      NuStep   Level 1    Minutes 20    METs 1.8             Social History   Tobacco Use  Smoking Status Former   Packs/day: 1.00   Years: 60.00   Additional pack years: 0.00   Total pack years: 60.00   Types: Cigarettes   Start date: 42   Quit date: 01/08/2022   Years since quitting: 1.0  Smokeless Tobacco Never    Goals Met:  No report of concerns or symptoms today  Goals Unmet:  Not Applicable  Comments: Pt started cardiac rehab today.  Pt tolerated light exercise without difficulty. VSS, telemetry-Sinus Rhythm, first degree heart block, asymptomatic.  Medication list reconciled. Pt denies barriers to medicaiton compliance.  PSYCHOSOCIAL ASSESSMENT:  PHQ-8.  Will review quality of life questionnaire in PHQ2-9 in the upcoming week  Pt enjoys spending time with  friends and family.   Pt oriented to exercise equipment and routine.    Understanding verbalized. Thayer Headings RN BSN    Dr. Armanda Magic is Medical Director for Cardiac Rehab at [facility].

## 2023-01-22 NOTE — Telephone Encounter (Signed)
Left message x 2 for patient to call clinic to give information regarding Anoro test claim amount.

## 2023-01-23 ENCOUNTER — Ambulatory Visit: Payer: Medicare Other

## 2023-01-23 ENCOUNTER — Ambulatory Visit: Payer: Medicare Other | Attending: Cardiology | Admitting: Student

## 2023-01-23 VITALS — Ht 65.0 in | Wt 248.0 lb

## 2023-01-23 DIAGNOSIS — Z6841 Body Mass Index (BMI) 40.0 and over, adult: Secondary | ICD-10-CM

## 2023-01-23 NOTE — Progress Notes (Signed)
HPI: Brittany Jackson is a 82 y.o. female patient referred to pharmacy clinic by Dr. Shirlee Latch to initiate weight loss therapy with GLP1-RA.  Most recent BMI 41. PMH significant for chronic diastolic CHF, tobacco use, HTN, former smoker, and COPD, STEMI 01/08/22.   Patient presented today for GLP1 consultation. We discussed current dietary habits and dose titration schedule for [facility]. Few of her family members are on [facility] and she is hoping to get that one approved. her A1c is still in pre-dietetic range and Greggory Keen is not indicated to treat pre-diabetes patient made aware of the fact. We discussed dose titration schedule for Wegovy, common side effects and its management. Importance of lifestyle interventions along with GLP1    Current weight management medications: none   Previously tried meds: none  Current meds that may affect weight: none   Baseline weight/BMI: 248 lbs  41 kg/m2   Diet: she does not eat 3 meals per day  She some times snacks instead of having full meal. Snacks include peanut butter and crackers, fruit with cheese  She likes boiled egg  Does not like water much but drinks around 4 -5 full glass in a day  Avoids soda and sugary drinks   Exercise: none    Family History:  Confirmed patient has no personal or family history of medullary thyroid carcinoma (MTC) or Multiple Endocrine Neoplasia syndrome type 2 (MEN 2). Or hx of pancreatitis or gall bladder issue    Labs: Lab Results  Component Value Date   HGBA1C 5.7 (H) 12/27/2022    Wt Readings from Last 1 Encounters:  01/09/23 249 lb 5.4 oz (113.1 kg)    BP Readings from Last 1 Encounters:  01/09/23 120/60   Pulse Readings from Last 1 Encounters:  01/09/23 79       Component Value Date/Time   CHOL 158 12/27/2022 1600   TRIG 106 12/27/2022 1600   HDL 74 12/27/2022 1600   CHOLHDL 2.1 12/27/2022 1600   VLDL 21 12/27/2022 1600   LDLCALC 63 12/27/2022 1600    Past Medical History:   Diagnosis Date   CHF (congestive heart failure) (HCC)    Hypertension    Urticaria     Current Outpatient Medications on File Prior to Visit  Medication Sig Dispense Refill   acetaminophen (TYLENOL) 500 MG tablet Take 500-1,000 mg by mouth every 6 (six) hours as needed (pain.).     albuterol (VENTOLIN HFA) 108 (90 Base) MCG/ACT inhaler Inhale 2 puffs into the lungs every 6 (six) hours as needed for wheezing or shortness of breath. 8 g 6   amiodarone (PACERONE) 200 MG tablet Take 0.5 tablets (100 mg total) by mouth daily. 45 tablet 3   apixaban (ELIQUIS) 2.5 MG TABS tablet Take 1 tablet (2.5 mg total) by mouth 2 (two) times daily. 60 tablet 11   atorvastatin (LIPITOR) 80 MG tablet TAKE ONE TABLET BY MOUTH DAILY 30 tablet 5   Coenzyme Q10 (COQ10 PO) Take 1 capsule by mouth in the morning. (Patient not taking: Reported on 01/09/2023)     colchicine 0.6 MG tablet Take 0.6 mg by mouth 2 (two) times daily as needed (onset of gout). (Patient not taking: Reported on 01/09/2023)     Cyanocobalamin POWD Take by mouth. (Patient not taking: Reported on 01/09/2023)     diazepam (VALIUM) 10 MG tablet Take 1 tablet (10 mg total) by mouth 3 (three) times daily as needed for anxiety. 5 tablet 0   empagliflozin (JARDIANCE) 10 MG  TABS tablet Take 1 tablet (10 mg total) by mouth daily before breakfast. 14 tablet 11   hydrocortisone cream 1 % Apply topically as needed for itching. 28 g 0   methylPREDNISolone (MEDROL DOSEPAK) 4 MG TBPK tablet See admin instructions. (Patient not taking: Reported on 01/08/2023)     Multiple Vitamins-Minerals (ADULT ONE DAILY GUMMIES PO) Take 1 tablet by mouth in the morning.     Red Yeast Rice 600 MG CAPS Take 600 mg by mouth daily. (Patient not taking: Reported on 01/09/2023)     Tiotropium Bromide-Olodaterol (STIOLTO RESPIMAT) 2.5-2.5 MCG/ACT AERS Inhale 2 puffs into the lungs daily. 1 each 0   torsemide (DEMADEX) 20 MG tablet Take 1 tablet (20 mg total) by mouth every other day. 45  tablet 3   umeclidinium-vilanterol (ANORO ELLIPTA) 62.5-25 MCG/ACT AEPB Inhale 1 puff into the lungs daily. (Patient not taking: Reported on 01/09/2023) 60 each 5   No current facility-administered medications on file prior to visit.    Allergies  Allergen Reactions   Chlorhexidine Other (See Comments)    Burns skin immediately   Codeine Other (See Comments)    Unknown   Meprobamate Swelling   Sulfa Antibiotics Other (See Comments)    Unknown    Assessment/Plan   Obesity Assessment and Plan: Current weight 248 lbs BMI 41 kg/m2 Fairly healthy diet, exercise none  Discussed Wegovy dose titration schedule, common side effects and its management  Will initiate PA for [facility]  Will inform patient upon approval   .vp

## 2023-01-23 NOTE — Patient Instructions (Addendum)
Will apply for [facility] PA and will let you know when it get approved from your insurance.

## 2023-01-24 ENCOUNTER — Telehealth: Payer: Self-pay | Admitting: Pharmacist

## 2023-01-24 ENCOUNTER — Other Ambulatory Visit (HOSPITAL_COMMUNITY): Payer: Self-pay

## 2023-01-24 ENCOUNTER — Encounter (HOSPITAL_COMMUNITY): Payer: Medicare Other

## 2023-01-24 ENCOUNTER — Encounter: Payer: Self-pay | Admitting: Student

## 2023-01-24 ENCOUNTER — Telehealth: Payer: Self-pay

## 2023-01-24 DIAGNOSIS — E669 Obesity, unspecified: Secondary | ICD-10-CM | POA: Insufficient documentation

## 2023-01-24 NOTE — Telephone Encounter (Signed)
   Informed patient about Wegovy or Zepbound coverage.   Encouraged patient to continue with life style interventions.

## 2023-01-24 NOTE — Assessment & Plan Note (Signed)
Assessment and Plan: Current weight 248 lbs BMI 41 kg/m2 Fairly healthy diet, exercise none  Discussed Wegovy dose titration schedule, common side effects and its management  Will initiate PA for [facility]  Will inform patient upon approval

## 2023-01-24 NOTE — Telephone Encounter (Signed)
   No p/a req for Mounjaro. Insurance covers at a descent amt of 20.67monthly if you buy the drug mthly vs every 3 mths.       Test claim: SUPPLY     SUPPLY

## 2023-01-27 ENCOUNTER — Encounter (HOSPITAL_COMMUNITY)
Admission: RE | Admit: 2023-01-27 | Discharge: 2023-01-27 | Disposition: A | Discharge status: Home / Self Care | Payer: Medicare Other | Source: Ambulatory Visit | Attending: Cardiology | Admitting: Cardiology

## 2023-01-27 DIAGNOSIS — I213 ST elevation (STEMI) myocardial infarction of unspecified site: Secondary | ICD-10-CM | POA: Diagnosis not present

## 2023-01-27 DIAGNOSIS — I5032 Chronic diastolic (congestive) heart failure: Secondary | ICD-10-CM

## 2023-01-27 NOTE — Progress Notes (Addendum)
QUALITY OF LIFE SCORE REVIEW  Pt completed Quality of Life survey as a participant in Cardiac Rehab.  Scores 19.0 or below are considered low. Overall 22.52, Health and Function 19.85, socioeconomic 27.83, physiological and spiritual 27.43, family 12.00. Patient quality of life slightly altered by physical constraints which limits ability to perform as prior to recent cardiac illness.  Patient's concerns are lack of money and the bills she has to pay. She gets assistance for food, and her son and daughter help as they can. Offered to help her get counseling services, but she stated she does not want any more appointments. She blames herself for her cardiac issues. 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 Offered emotional support and reassurance.  Will continue to monitor and intervene as necessary.

## 2023-01-29 ENCOUNTER — Telehealth: Payer: Self-pay | Admitting: Pharmacist

## 2023-01-29 ENCOUNTER — Encounter (HOSPITAL_COMMUNITY)
Admission: RE | Admit: 2023-01-29 | Discharge: 2023-01-29 | Disposition: A | Discharge status: Home / Self Care | Payer: Medicare Other | Source: Ambulatory Visit | Attending: Cardiology | Admitting: Cardiology

## 2023-01-29 DIAGNOSIS — I213 ST elevation (STEMI) myocardial infarction of unspecified site: Secondary | ICD-10-CM

## 2023-01-29 DIAGNOSIS — I5032 Chronic diastolic (congestive) heart failure: Secondary | ICD-10-CM

## 2023-01-29 NOTE — Telephone Encounter (Signed)
Patient is calling requesting her daughter be called to discuss the medication study she is taking part of and the details of said study. Please advise.

## 2023-01-29 NOTE — Telephone Encounter (Signed)
Called daughter N/A LVM.

## 2023-01-31 ENCOUNTER — Encounter (HOSPITAL_COMMUNITY): Payer: Medicare Other

## 2023-02-03 ENCOUNTER — Encounter (HOSPITAL_COMMUNITY): Payer: Medicare Other

## 2023-02-05 ENCOUNTER — Encounter (HOSPITAL_COMMUNITY): Payer: Medicare Other

## 2023-02-07 ENCOUNTER — Encounter (HOSPITAL_COMMUNITY): Payer: Medicare Other

## 2023-02-10 ENCOUNTER — Encounter (HOSPITAL_COMMUNITY): Payer: Medicare Other

## 2023-02-10 NOTE — Telephone Encounter (Signed)
We reached out to her regarding the study.  She was not interested in the trial due to the chance of getting a placebo.    Thank you for the referral.  Mal Misty

## 2023-02-11 ENCOUNTER — Ambulatory Visit: Payer: Medicare Other

## 2023-02-11 NOTE — Progress Notes (Deleted)
HPI: Brittany Jackson is a 82 y.o. female patient referred to pharmacy clinic by Dr. Shirlee Latch to initiate weight loss therapy with GLP1-RA.  Most recent BMI 41. PMH significant for chronic diastolic CHF, tobacco use, HTN, former smoker, and COPD, STEMI 01/08/22.   Patient presented today for GLP1 consultation. We discussed current dietary habits and dose titration schedule for [facility]. Few of her family members are on [facility] and she is hoping to get that one approved. her A1c is still in pre-dietetic range and Greggory Keen is not indicated to treat pre-diabetes patient made aware of the fact. We discussed dose titration schedule for Wegovy, common side effects and its management. Importance of lifestyle interventions along with GLP1    Current weight management medications: none   Previously tried meds: none  Current meds that may affect weight: none   Baseline weight/BMI: 248 lbs  41 kg/m2   Diet: she does not eat 3 meals per day  She some times snacks instead of having full meal. Snacks include peanut butter and crackers, fruit with cheese  She likes boiled egg  Does not like water much but drinks around 4 -5 full glass in a day  Avoids soda and sugary drinks   Exercise: none    Family History:  Confirmed patient has no personal or family history of medullary thyroid carcinoma (MTC) or Multiple Endocrine Neoplasia syndrome type 2 (MEN 2). Or hx of pancreatitis or gall bladder issue    Labs: Lab Results  Component Value Date   HGBA1C 5.7 (H) 12/27/2022    Wt Readings from Last 1 Encounters:  01/24/23 248 lb (112.5 kg)    BP Readings from Last 1 Encounters:  01/09/23 120/60   Pulse Readings from Last 1 Encounters:  01/09/23 79       Component Value Date/Time   CHOL 158 12/27/2022 1600   TRIG 106 12/27/2022 1600   HDL 74 12/27/2022 1600   CHOLHDL 2.1 12/27/2022 1600   VLDL 21 12/27/2022 1600   LDLCALC 63 12/27/2022 1600    Past Medical History:  Diagnosis  Date   CHF (congestive heart failure) (HCC)    Hypertension    Urticaria     Current Outpatient Medications on File Prior to Visit  Medication Sig Dispense Refill   acetaminophen (TYLENOL) 500 MG tablet Take 500-1,000 mg by mouth every 6 (six) hours as needed (pain.).     albuterol (VENTOLIN HFA) 108 (90 Base) MCG/ACT inhaler Inhale 2 puffs into the lungs every 6 (six) hours as needed for wheezing or shortness of breath. 8 g 6   amiodarone (PACERONE) 200 MG tablet Take 0.5 tablets (100 mg total) by mouth daily. 45 tablet 3   apixaban (ELIQUIS) 2.5 MG TABS tablet Take 1 tablet (2.5 mg total) by mouth 2 (two) times daily. 60 tablet 11   atorvastatin (LIPITOR) 80 MG tablet TAKE ONE TABLET BY MOUTH DAILY 30 tablet 5   Coenzyme Q10 (COQ10 PO) Take 1 capsule by mouth in the morning. (Patient not taking: Reported on 01/09/2023)     colchicine 0.6 MG tablet Take 0.6 mg by mouth 2 (two) times daily as needed (onset of gout). (Patient not taking: Reported on 01/09/2023)     Cyanocobalamin POWD Take by mouth. (Patient not taking: Reported on 01/09/2023)     diazepam (VALIUM) 10 MG tablet Take 1 tablet (10 mg total) by mouth 3 (three) times daily as needed for anxiety. 5 tablet 0   empagliflozin (JARDIANCE) 10 MG TABS tablet  Take 1 tablet (10 mg total) by mouth daily before breakfast. 14 tablet 11   hydrocortisone cream 1 % Apply topically as needed for itching. 28 g 0   methylPREDNISolone (MEDROL DOSEPAK) 4 MG TBPK tablet See admin instructions. (Patient not taking: Reported on 01/08/2023)     Multiple Vitamins-Minerals (ADULT ONE DAILY GUMMIES PO) Take 1 tablet by mouth in the morning.     Red Yeast Rice 600 MG CAPS Take 600 mg by mouth daily. (Patient not taking: Reported on 01/09/2023)     Tiotropium Bromide-Olodaterol (STIOLTO RESPIMAT) 2.5-2.5 MCG/ACT AERS Inhale 2 puffs into the lungs daily. 1 each 0   torsemide (DEMADEX) 20 MG tablet Take 1 tablet (20 mg total) by mouth every other day. 45 tablet 3    umeclidinium-vilanterol (ANORO ELLIPTA) 62.5-25 MCG/ACT AEPB Inhale 1 puff into the lungs daily. (Patient not taking: Reported on 01/09/2023) 60 each 5   No current facility-administered medications on file prior to visit.    Allergies  Allergen Reactions   Chlorhexidine Other (See Comments)    Burns skin immediately   Codeine Other (See Comments)    Unknown   Meprobamate Swelling   Sulfa Antibiotics Other (See Comments)    Unknown    Assessment/Plan   No problem-specific Assessment & Plan notes found for this encounter.

## 2023-02-12 ENCOUNTER — Encounter (HOSPITAL_COMMUNITY): Payer: Medicare Other

## 2023-02-14 ENCOUNTER — Encounter (HOSPITAL_COMMUNITY): Payer: Medicare Other

## 2023-02-17 ENCOUNTER — Encounter (HOSPITAL_COMMUNITY): Payer: Medicare Other

## 2023-02-18 ENCOUNTER — Encounter (HOSPITAL_COMMUNITY): Payer: Self-pay | Admitting: *Deleted

## 2023-02-18 ENCOUNTER — Telehealth (HOSPITAL_COMMUNITY): Payer: Self-pay | Admitting: *Deleted

## 2023-02-18 DIAGNOSIS — I5032 Chronic diastolic (congestive) heart failure: Secondary | ICD-10-CM

## 2023-02-18 NOTE — Progress Notes (Signed)
Cardiac Individual Treatment Plan  Patient Details  Name: Brittany Jackson MRN: [id number] Date of Birth: 05-Jul-1941 Referring Provider:   Flowsheet Row INTENSIVE CARDIAC REHAB ORIENT from 01/09/2023 in [facility]  Referring Provider Dr. Marca Ancona, MD       Initial Encounter Date:  Flowsheet Row INTENSIVE CARDIAC REHAB ORIENT from 01/09/2023 in [facility] for [facility], [facility], [facility]  Date 01/09/23       Visit Diagnosis: Heart failure, diastolic, chronic (HCC)  Patient's Home Medications on Admission:  Current Outpatient Medications:    acetaminophen (TYLENOL) 500 MG tablet, Take 500-1,000 mg by mouth every 6 (six) hours as needed (pain.)., Disp: , Rfl:    albuterol (VENTOLIN HFA) 108 (90 Base) MCG/ACT inhaler, Inhale 2 puffs into the lungs every 6 (six) hours as needed for wheezing or shortness of breath., Disp: 8 g, Rfl: 6   amiodarone (PACERONE) 200 MG tablet, Take 0.5 tablets (100 mg total) by mouth daily., Disp: 45 tablet, Rfl: 3   apixaban (ELIQUIS) 2.5 MG TABS tablet, Take 1 tablet (2.5 mg total) by mouth 2 (two) times daily., Disp: 60 tablet, Rfl: 11   atorvastatin (LIPITOR) 80 MG tablet, TAKE ONE TABLET BY MOUTH DAILY, Disp: 30 tablet, Rfl: 5   Coenzyme Q10 (COQ10 PO), Take 1 capsule by mouth in the morning. (Patient not taking: Reported on 01/09/2023), Disp: , Rfl:    colchicine 0.6 MG tablet, Take 0.6 mg by mouth 2 (two) times daily as needed (onset of gout). (Patient not taking: Reported on 01/09/2023), Disp: , Rfl:    Cyanocobalamin POWD, Take by mouth. (Patient not taking: Reported on 01/09/2023), Disp: , Rfl:    diazepam (VALIUM) 10 MG tablet, Take 1 tablet (10 mg total) by mouth 3 (three) times daily as needed for anxiety., Disp: 5 tablet, Rfl: 0   empagliflozin (JARDIANCE) 10 MG TABS tablet, Take 1 tablet (10 mg total) by mouth daily before breakfast., Disp: 14 tablet, Rfl: 11    hydrocortisone cream 1 %, Apply topically as needed for itching., Disp: 28 g, Rfl: 0   methylPREDNISolone (MEDROL DOSEPAK) 4 MG TBPK tablet, See admin instructions. (Patient not taking: Reported on 01/08/2023), Disp: , Rfl:    Multiple Vitamins-Minerals (ADULT ONE DAILY GUMMIES PO), Take 1 tablet by mouth in the morning., Disp: , Rfl:    Red Yeast Rice 600 MG CAPS, Take 600 mg by mouth daily. (Patient not taking: Reported on 01/09/2023), Disp: , Rfl:    Tiotropium Bromide-Olodaterol (STIOLTO RESPIMAT) 2.5-2.5 MCG/ACT AERS, Inhale 2 puffs into the lungs daily., Disp: 1 each, Rfl: 0   torsemide (DEMADEX) 20 MG tablet, Take 1 tablet (20 mg total) by mouth every other day., Disp: 45 tablet, Rfl: 3   umeclidinium-vilanterol (ANORO ELLIPTA) 62.5-25 MCG/ACT AEPB, Inhale 1 puff into the lungs daily. (Patient not taking: Reported on 01/09/2023), Disp: 60 each, Rfl: 5  Past Medical History: Past Medical History:  Diagnosis Date   CHF (congestive heart failure) (HCC)    Hypertension    Urticaria     Tobacco Use: Social History   Tobacco Use  Smoking Status Former   Packs/day: 1.00   Years: 60.00   Additional pack years: 0.00   Total pack years: 60.00   Types: Cigarettes   Start date: 110   Quit date: 01/08/2022   Years since quitting: 1.1  Smokeless Tobacco Never    Labs: Review Flowsheet  More data exists  Latest Ref Rng & Units 01/20/2022 01/21/2022 01/22/2022 03/19/2022 12/27/2022  Labs for ITP Cardiac and Pulmonary Rehab  Cholestrol 0 - 200 mg/dL - - - 161  096   LDL (calc) 0 - 99 mg/dL - - - 71  63   HDL-C >04 mg/dL - - - 56  74   Trlycerides <150 mg/dL - - - 540  981   Hemoglobin A1c 4.8 - 5.6 % - - - - 5.7   O2 Saturation % 78.5  87.6  58  47.9  - -    Capillary Blood Glucose: Lab Results  Component Value Date   GLUCAP 105 (H) 01/22/2022   GLUCAP 80 01/22/2022   GLUCAP 83 01/22/2022   GLUCAP 93 01/21/2022   GLUCAP 86 01/21/2022     Exercise Target Goals: Exercise Program  Goal: Individual exercise prescription set using results from initial 6 min walk test and THRR while considering  patient's activity barriers and safety.   Exercise Prescription Goal: Initial exercise prescription builds to 30-45 minutes a day of aerobic activity, 2-3 days per week.  Home exercise guidelines will be given to patient during program as part of exercise prescription that the participant will acknowledge.  Activity Barriers & Risk Stratification:  Activity Barriers & Cardiac Risk Stratification - 01/09/23 1159       Activity Barriers & Cardiac Risk Stratification   Activity Barriers Joint Problems;Deconditioning;History of Falls;Balance Concerns;Muscular Weakness;Decreased Ventricular Function    Cardiac Risk Stratification High             6 Minute Walk:  6 Minute Walk     Row Name 01/09/23 1156         6 Minute Walk   Phase Initial     Distance 840 feet     Walk Time 6 minutes     # of Rest Breaks 2  Both standing rest breaks due to hip pain and fatigue 3.24-3.44 and 1.07-1.28     MPH 1.59     METS 0.69     RPE 12     Perceived Dyspnea  0     VO2 Peak 2.41     Symptoms Yes (comment)     Comments Chronic bilateral hip pain, left toe gout pain, and fatigue 3/10 pain     Resting HR 79 bpm     Resting BP 120/60     Resting Oxygen Saturation  98 %     Exercise Oxygen Saturation  during 6 min walk 99 %     Max Ex. HR 106 bpm     Max Ex. BP 150/74     2 Minute Post BP 134/64              Oxygen Initial Assessment:   Oxygen Re-Evaluation:   Oxygen Discharge (Final Oxygen Re-Evaluation):   Initial Exercise Prescription:  Initial Exercise Prescription - 01/09/23 1200       Date of Initial Exercise RX and Referring Provider   Date 01/09/23    Referring Provider Dr. Marca Ancona, MD    Expected Discharge Date 03/21/23      NuStep   Level 1    SPM 80    Minutes 20    METs 1.7      Prescription Details   Frequency (times per week) 3     Duration Progress to 30 minutes of continuous aerobic without signs/symptoms of physical distress      Intensity   THRR 40-80% of Max Heartrate 54-109  Ratings of Perceived Exertion 11-13    Perceived Dyspnea 0-4      Progression   Progression Continue to progress workloads to maintain intensity without signs/symptoms of physical distress.      Resistance Training   Training Prescription Yes    Weight 2    Reps 10-15             Perform Capillary Blood Glucose checks as needed.  Exercise Prescription Changes:   Exercise Prescription Changes     Row Name 01/22/23 1634             Response to Exercise   Blood Pressure (Admit) 144/84       Blood Pressure (Exercise) 184/74  DEC WL recheck 180/56       Blood Pressure (Exit) 110/58       Heart Rate (Admit) 65 bpm       Heart Rate (Exercise) 105 bpm       Heart Rate (Exit) 74 bpm       Rating of Perceived Exertion (Exercise) 13       Perceived Dyspnea (Exercise) 0       Symptoms elevated BP with exercise       Comments Pt first day in teh Pritikin ICR program       Duration Progress to 30 minutes of  aerobic without signs/symptoms of physical distress       Intensity THRR unchanged         Progression   Progression Continue to progress workloads to maintain intensity without signs/symptoms of physical distress.       Average METs 1.8         Resistance Training   Training Prescription Yes       Weight 2       Reps 10-15       Time 10 Minutes         NuStep   Level 1       Minutes 20       METs 1.8                Exercise Comments:   Exercise Comments     Row Name 01/22/23 1633 02/13/23 1237         Exercise Comments Pt was discharged, but has returned, Pt new first day in the CRP2 program, Pt tolerated exercise well with an average MET level of 1.8. Pt did have elevated BP, DEC WL with exercise. Pt is relearning her THRR, RPE and ExRx Since readmission patient has been absent since 01/29/23. Will  review education and goals when patient returns for exercise               Exercise Goals and Review:   Exercise Goals     Row Name 01/09/23 1202             Exercise Goals   Increase Physical Activity Yes       Intervention Provide advice, education, support and counseling about physical activity/exercise needs.;Develop an individualized exercise prescription for aerobic and resistive training based on initial evaluation findings, risk stratification, comorbidities and participant's personal goals.       Expected Outcomes Short Term: Attend rehab on a regular basis to increase amount of physical activity.;Long Term: Exercising regularly at least 3-5 days a week.;Long Term: Add in home exercise to make exercise part of routine and to increase amount of physical activity.       Increase Strength and Stamina Yes  Intervention Provide advice, education, support and counseling about physical activity/exercise needs.;Develop an individualized exercise prescription for aerobic and resistive training based on initial evaluation findings, risk stratification, comorbidities and participant's personal goals.       Expected Outcomes Short Term: Increase workloads from initial exercise prescription for resistance, speed, and METs.;Short Term: Perform resistance training exercises routinely during rehab and add in resistance training at home;Long Term: Improve cardiorespiratory fitness, muscular endurance and strength as measured by increased METs and functional capacity ( )       Able to understand and use rate of perceived exertion (RPE) scale Yes       Intervention Provide education and explanation on how to use RPE scale       Expected Outcomes Short Term: Able to use RPE daily in rehab to express subjective intensity level;Long Term:  Able to use RPE to guide intensity level when exercising independently       Knowledge and understanding of Target Heart Rate Range (THRR) Yes        Intervention Provide education and explanation of THRR including how the numbers were predicted and where they are located for reference       Expected Outcomes Short Term: Able to state/look up THRR;Long Term: Able to use THRR to govern intensity when exercising independently;Short Term: Able to use daily as guideline for intensity in rehab       Understanding of Exercise Prescription Yes       Intervention Provide education, explanation, and written materials on patient's individual exercise prescription       Expected Outcomes Short Term: Able to explain program exercise prescription;Long Term: Able to explain home exercise prescription to exercise independently                Exercise Goals Re-Evaluation :  Exercise Goals Re-Evaluation     Row Name 01/22/23 1630             Exercise Goal Re-Evaluation   Exercise Goals Review Increase Physical Activity;Understanding of Exercise Prescription;Increase Strength and Stamina;Knowledge and understanding of Target Heart Rate Range (THRR);Able to understand and use rate of perceived exertion (RPE) scale       Comments Pt was discharged, but has returned, Pt new first day in the CRP2 program, Pt tolerated exercise well with an average MET level of 1.8. Pt did have elevated BP, DEC WL with exercise. Pt is relearning her THRR, RPE and ExRx       Expected Outcomes Will continue to monitor pt and progress workloads as tolerated without sign or symptom.                Discharge Exercise Prescription (Final Exercise Prescription Changes):  Exercise Prescription Changes - 01/22/23 1634       Response to Exercise   Blood Pressure (Admit) 144/84    Blood Pressure (Exercise) 184/74   DEC WL recheck 180/56   Blood Pressure (Exit) 110/58    Heart Rate (Admit) 65 bpm    Heart Rate (Exercise) 105 bpm    Heart Rate (Exit) 74 bpm    Rating of Perceived Exertion (Exercise) 13    Perceived Dyspnea (Exercise) 0    Symptoms elevated BP with  exercise    Comments Pt first day in teh Pritikin ICR program    Duration Progress to 30 minutes of  aerobic without signs/symptoms of physical distress    Intensity THRR unchanged      Progression   Progression Continue to progress workloads to maintain  intensity without signs/symptoms of physical distress.    Average METs 1.8      Resistance Training   Training Prescription Yes    Weight 2    Reps 10-15    Time 10 Minutes      NuStep   Level 1    Minutes 20    METs 1.8             Nutrition:  Target Goals: Understanding of nutrition guidelines, daily intake of sodium 1500mg , cholesterol 200mg , calories 30% from fat and 7% or less from saturated fats, daily to have 5 or more servings of fruits and vegetables.  Biometrics:  Pre Biometrics - 01/09/23 1202       Pre Biometrics   Height 5\' 5"  (1.651 m)    Weight 249 lb 5.4 oz (113.1 kg)    Waist Circumference 51.5 inches    Hip Circumference 57.5 inches    Waist to Hip Ratio 0.9 %    BMI (Calculated) 41.49    Triceps Skinfold 35 mm    Grip Strength 27 kg    Flexibility --   Did not attempt due to pain   Single Leg Stand 1.2 seconds              Nutrition Therapy Plan and Nutrition Goals:  Nutrition Therapy & Goals - 01/23/23 0839       Nutrition Therapy   Diet Heart Healthy Diet    Drug/Food Interactions Statins/Certain Fruits      Personal Nutrition Goals   Nutrition Goal Patient to identify strategies for reducing cardiovascular risk by attending the P[facility]in education and nutrition series weekly.    Personal Goal #2 Patient to improve diet quality by using the plate method as a guide for meal planning to include lean protein/plant protein, fruits, vegetables, whole grains, nonfat dairy as part of a well-balanced diet.    Personal Goal #3 Patient to limit sodium to 1500mg  per day.    Comments Brittany Jackson has previously started Intensive Cardiac Rehab in February 2024 but was unable to complete. Per  heart failure clinic notes, between 08/26/22 visit and 12/27/22 visit she was up 14#. She continues regular follow-up with nephrology. Patient will benefit from participation in intensive cardiac rehab for nutrition, exercise, and lifestyle modification.      Intervention Plan   Intervention Prescribe, educate and counsel regarding individualized specific dietary modifications aiming towards targeted core components such as weight, hypertension, lipid management, diabetes, heart failure and other comorbidities.;Nutrition handout(s) given to patient.    Expected Outcomes Short Term Goal: Understand basic principles of dietary content, such as calories, fat, sodium, cholesterol and nutrients.;Long Term Goal: Adherence to prescribed nutrition plan.             Nutrition Assessments:  MEDIFICTS Score Key: ?70 Need to make dietary changes  40-70 Heart Healthy Diet ? 40 Therapeutic Level Cholesterol Diet    Picture Your Plate Scores: <11 Unhealthy dietary pattern with much room for improvement. 41-50 Dietary pattern unlikely to meet recommendations for good health and room for improvement. 51-60 More healthful dietary pattern, with some room for improvement.  >60 Healthy dietary pattern, although there may be some specific behaviors that could be improved.    Nutrition Goals Re-Evaluation:  Nutrition Goals Re-Evaluation     Row Name 01/23/23 0839             Goals   Current Weight 250 lb 3.6 oz (113.5 kg)       Comment A1c  5.7, lipids WNL, GFR 23, Cr 2.10       Expected Outcome Brittany Jackson has previously started Intensive Cardiac Rehab in February 2024 but was unable to complete. Per heart failure clinic notes, between 08/26/22 visit and 12/27/22 visit she was up 14#. She continues regular follow-up with nephrology. Patient will benefit from participation in intensive cardiac rehab for nutrition, exercise, and lifestyle modification.                Nutrition Goals Re-Evaluation:   Nutrition Goals Re-Evaluation     Row Name 01/23/23 0839             Goals   Current Weight 250 lb 3.6 oz (113.5 kg)       Comment A1c 5.7, lipids WNL, GFR 23, Cr 2.10       Expected Outcome Brittany Jackson has previously started Intensive Cardiac Rehab in February 2024 but was unable to complete. Per heart failure clinic notes, between 08/26/22 visit and 12/27/22 visit she was up 14#. She continues regular follow-up with nephrology. Patient will benefit from participation in intensive cardiac rehab for nutrition, exercise, and lifestyle modification.                Nutrition Goals Discharge (Final Nutrition Goals Re-Evaluation):  Nutrition Goals Re-Evaluation - 01/23/23 0839       Goals   Current Weight 250 lb 3.6 oz (113.5 kg)    Comment A1c 5.7, lipids WNL, GFR 23, Cr 2.10    Expected Outcome Brittany Jackson has previously started Intensive Cardiac Rehab in February 2024 but was unable to complete. Per heart failure clinic notes, between 08/26/22 visit and 12/27/22 visit she was up 14#. She continues regular follow-up with nephrology. Patient will benefit from participation in intensive cardiac rehab for nutrition, exercise, and lifestyle modification.             Psychosocial: Target Goals: Acknowledge presence or absence of significant depression and/or stress, maximize coping skills, provide positive support system. Participant is able to verbalize types and ability to use techniques and skills needed for reducing stress and depression.  Initial Review & Psychosocial Screening:  Initial Psych Review & Screening - 01/09/23 1009       Initial Review   Current issues with Current Depression;Current Stress Concerns;History of Depression    Source of Stress Concerns Chronic Illness;Financial    Comments Pt states she has a hard time with her finances and her heart condition. Both of these issues are stresses for her, but she has friends and family who help. She has denighed counclining at this time       Family Dynamics   Good Support System? Yes    Comments friends and family      Barriers   Psychosocial barriers to participate in program The patient should benefit from training in stress management and relaxation.      Screening Interventions   Interventions Encouraged to exercise;To provide support and resources with identified psychosocial needs;Provide feedback about the scores to participant    Expected Outcomes Short Term goal: Identification and review with participant of any Quality of Life or Depression concerns found by scoring the questionnaire.   No counciling wanted            Quality of Life Scores:  Quality of Life - 01/09/23 1206       Quality of Life   Select Quality of Life      Quality of Life Scores   Health/Function Pre 19.85 %  Socioeconomic Pre 27.83 %    Psych/Spiritual Pre 27.43 %    Family Pre 12 %    GLOBAL Pre 22.52 %            Scores of 19 and below usually indicate a poorer quality of life in these areas.  A difference of  2-3 points is a clinically meaningful difference.  A difference of 2-3 points in the total score of the Quality of Life Index has been associated with significant improvement in overall quality of life, self-image, physical symptoms, and general health in studies assessing change in quality of life.  PHQ-9: Review Flowsheet       01/09/2023 09/26/2022  Depression screen PHQ 2/9  Decreased Interest 2 0  Down, Depressed, Hopeless 1 1  PHQ - 2 Score 3 1  Altered sleeping 0 0  Tired, decreased energy 2 2  Change in appetite 2 3  Feeling bad or failure about yourself  1 2  Trouble concentrating 0 0  Moving slowly or fidgety/restless 0 1  Suicidal thoughts 0 0  PHQ-9 Score 8 9  Difficult doing work/chores Not difficult at all Not difficult at all   Interpretation of Total Score  Total Score Depression Severity:  1-4 = Minimal depression, 5-9 = Mild depression, 10-14 = Moderate depression, 15-19 = Moderately  severe depression, 20-27 = Severe depression   Psychosocial Evaluation and Intervention:   Psychosocial Re-Evaluation:  Psychosocial Re-Evaluation     Row Name 02/18/23 1204             Psychosocial Re-Evaluation   Current issues with Current Stress Concerns;Current Depression;History of Depression       Comments Brittany Jackson's last day of was on 01/29/23. Unable to reassess       Continue Psychosocial Services  Follow up required by staff                Psychosocial Discharge (Final Psychosocial Re-Evaluation):  Psychosocial Re-Evaluation - 02/18/23 1204       Psychosocial Re-Evaluation   Current issues with Current Stress Concerns;Current Depression;History of Depression    Comments Brittany Jackson's last day of was on 01/29/23. Unable to reassess    Continue Psychosocial Services  Follow up required by staff             Vocational Rehabilitation: Provide vocational rehab assistance to qualifying candidates.   Vocational Rehab Evaluation & Intervention:  Vocational Rehab - 01/09/23 1210       Initial Vocational Rehab Evaluation & Intervention   Assessment shows need for Vocational Rehabilitation No   Patient is retired     Art therapist   Comments --             Education: Education Goals: Education classes will be provided on a weekly basis, covering required topics. Participant will state understanding/return demonstration of topics presented.    Education     Row Name 01/22/23 1500     Education   Cardiac Education Topics Pritikin   Customer service manager   Weekly Topic Fast and Healthy Breakfasts   Instruction Review Code 1- Verbalizes Understanding   Class Start Time 1400   Class Stop Time 1440   Class Time Calculation (min) 40 min            Core Videos: Exercise    Move It!  Clinical staff conducted group or individual video education with verbal and written material and guidebook.  Patient learns the recommended Pritikin exercise program. Exercise with the goal of living a long, healthy life. Some of the health benefits of exercise include controlled diabetes, healthier blood pressure levels, improved cholesterol levels, improved heart and lung capacity, improved sleep, and better body composition. Everyone should speak with their doctor before starting or changing an exercise routine.  Biomechanical Limitations Clinical staff conducted group or individual video education with verbal and written material and guidebook.  Patient learns how biomechanical limitations can impact exercise and how we can mitigate and possibly overcome limitations to have an impactful and balanced exercise routine.  Body Composition Clinical staff conducted group or individual video education with verbal and written material and guidebook.  Patient learns that body composition (ratio of muscle mass to fat mass) is a key component to assessing overall fitness, rather than body weight alone. Increased fat mass, especially visceral belly fat, can put [place] at increased risk for metabolic syndrome, type 2 diabetes, heart disease, and even death. It is recommended to combine diet and exercise (cardiovascular and resistance training) to improve your body composition. Seek guidance from your physician and exercise physiologist before implementing an exercise routine.  Exercise Action Plan Clinical staff conducted group or individual video education with verbal and written material and guidebook.  Patient learns the recommended strategies to achieve and enjoy long-term exercise adherence, including variety, self-motivation, self-efficacy, and positive decision making. Benefits of exercise include fitness, good health, weight management, more energy, better sleep, less stress, and overall well-being.  Medical   Heart Disease Risk Reduction Clinical staff conducted group or individual video education with verbal  and written material and guidebook.  Patient learns our heart is our most vital organ as it circulates oxygen, nutrients, white blood cells, and hormones throughout the entire body, and carries waste away. Data supports a plant-based eating plan like the Pritikin Program for its effectiveness in slowing progression of and reversing heart disease. The video provides a number of recommendations to address heart disease.   Metabolic Syndrome and Belly Fat  Clinical staff conducted group or individual video education with verbal and written material and guidebook.  Patient learns what metabolic syndrome is, how it leads to heart disease, and how one can reverse it and keep it from coming back. You have metabolic syndrome if you have 3 of the following 5 criteria: abdominal obesity, high blood pressure, high triglycerides, low HDL cholesterol, and high blood sugar.  Hypertension and Heart Disease Clinical staff conducted group or individual video education with verbal and written material and guidebook.  Patient learns that high blood pressure, or hypertension, is very common in the [place]. Hypertension is largely due to excessive salt intake, but other important risk factors include being overweight, physical inactivity, drinking too much alcohol, smoking, and not eating enough potassium from fruits and vegetables. High blood pressure is a leading risk factor for heart attack, stroke, congestive heart failure, dementia, kidney failure, and premature death. Long-term effects of excessive salt intake include stiffening of the arteries and thickening of heart muscle and organ damage. Recommendations include ways to reduce hypertension and the risk of heart disease.  Diseases of Our Time - Focusing on Diabetes Clinical staff conducted group or individual video education with verbal and written material and guidebook.  Patient learns why the best way to stop diseases of our time is prevention, through  food and other lifestyle changes. Medicine (such as prescription pills and surgeries) is often only a Band-Aid on the problem, not a  long-term solution. Most common diseases of our time include obesity, type 2 diabetes, hypertension, heart disease, and cancer. The Pritikin Program is recommended and has been proven to help reduce, reverse, and/or prevent the damaging effects of metabolic syndrome.  Nutrition   Overview of the Pritikin Eating Plan  Clinical staff conducted group or individual video education with verbal and written material and guidebook.  Patient learns about the Pritikin Eating Plan for disease risk reduction. The Pritikin Eating Plan emphasizes a wide variety of unrefined, minimally-processed carbohydrates, like fruits, vegetables, whole grains, and legumes. Go, Caution, and Stop food choices are explained. Plant-based and lean animal proteins are emphasized. Rationale provided for low sodium intake for blood pressure control, low added sugars for blood sugar stabilization, and low added fats and oils for coronary artery disease risk reduction and weight management.  Calorie Density  Clinical staff conducted group or individual video education with verbal and written material and guidebook.  Patient learns about calorie density and how it impacts the Pritikin Eating Plan. Knowing the characteristics of the food you choose will help you decide whether those foods will lead to weight gain or weight loss, and whether you want to consume more or less of them. Weight loss is usually a side effect of the Pritikin Eating Plan because of its focus on low calorie-dense foods.  Label Reading  Clinical staff conducted group or individual video education with verbal and written material and guidebook.  Patient learns about the Pritikin recommended label reading guidelines and corresponding recommendations regarding calorie density, added sugars, sodium content, and whole grains.  Dining Out -  Part 1  Clinical staff conducted group or individual video education with verbal and written material and guidebook.  Patient learns that restaurant meals can be sabotaging because they can be so high in calories, fat, sodium, and/or sugar. Patient learns recommended strategies on how to positively address this and avoid unhealthy pitfalls.  Facts on Fats  Clinical staff conducted group or individual video education with verbal and written material and guidebook.  Patient learns that lifestyle modifications can be just as effective, if not more so, as many medications for lowering your risk of heart disease. A Pritikin lifestyle can help to reduce your risk of inflammation and atherosclerosis (cholesterol build-up, or plaque, in the artery walls). Lifestyle interventions such as dietary choices and physical activity address the cause of atherosclerosis. A review of the types of fats and their impact on blood cholesterol levels, along with dietary recommendations to reduce fat intake is also included.  Nutrition Action Plan  Clinical staff conducted group or individual video education with verbal and written material and guidebook.  Patient learns how to incorporate Pritikin recommendations into their lifestyle. Recommendations include planning and keeping personal health goals in mind as an important part of their success.  Healthy Mind-Set    Healthy Minds, Bodies, Hearts  Clinical staff conducted group or individual video education with verbal and written material and guidebook.  Patient learns how to identify when they are stressed. Video will discuss the impact of that stress, as well as the many benefits of stress management. Patient will also be introduced to stress management techniques. The way we think, act, and feel has an impact on our hearts.  How Our Thoughts Can Heal Our Hearts  Clinical staff conducted group or individual video education with verbal and written material and  guidebook.  Patient learns that negative thoughts can cause depression and anxiety. This can result in negative lifestyle  behavior and serious health problems. Cognitive behavioral therapy is an effective method to help control our thoughts in order to change and improve our emotional outlook.  Additional Videos:  Exercise    Improving Performance  Clinical staff conducted group or individual video education with verbal and written material and guidebook.  Patient learns to use a non-linear approach by alternating intensity levels and lengths of time spent exercising to help burn more calories and lose more body fat. Cardiovascular exercise helps improve heart health, metabolism, hormonal balance, blood sugar control, and recovery from fatigue. Resistance training improves strength, endurance, balance, coordination, reaction time, metabolism, and muscle mass. Flexibility exercise improves circulation, posture, and balance. Seek guidance from your physician and exercise physiologist before implementing an exercise routine and learn your capabilities and proper form for all exercise.  Introduction to Yoga  Clinical staff conducted group or individual video education with verbal and written material and guidebook.  Patient learns about yoga, a discipline of the coming together of mind, breath, and body. The benefits of yoga include improved flexibility, improved range of motion, better posture and core strength, increased lung function, weight loss, and positive self-image. Yoga's heart health benefits include lowered blood pressure, healthier heart rate, decreased cholesterol and triglyceride levels, improved immune function, and reduced stress. Seek guidance from your physician and exercise physiologist before implementing an exercise routine and learn your capabilities and proper form for all exercise.  Medical   Aging: Enhancing Your Quality of Life  Clinical staff conducted group or individual  video education with verbal and written material and guidebook.  Patient learns key strategies and recommendations to stay in good physical health and enhance quality of life, such as prevention strategies, having an advocate, securing a Health Care Proxy and Power of Attorney, and keeping a list of medications and system for tracking them. It also discusses how to avoid risk for bone loss.  Biology of Weight Control  Clinical staff conducted group or individual video education with verbal and written material and guidebook.  Patient learns that weight gain occurs because we consume more calories than we burn (eating more, moving less). Even if your body weight is normal, you may have higher ratios of fat compared to muscle mass. Too much body fat puts you at increased risk for cardiovascular disease, heart attack, stroke, type 2 diabetes, and obesity-related cancers. In addition to exercise, following the Pritikin Eating Plan can help reduce your risk.  Decoding Lab Results  Clinical staff conducted group or individual video education with verbal and written material and guidebook.  Patient learns that lab test reflects one measurement whose values change over time and are influenced by many factors, including medication, stress, sleep, exercise, food, hydration, pre-existing medical conditions, and more. It is recommended to use the knowledge from this video to become more involved with your lab results and evaluate your numbers to speak with your doctor.   Diseases of Our Time - Overview  Clinical staff conducted group or individual video education with verbal and written material and guidebook.  Patient learns that according to the CDC, 50% to 70% of chronic diseases (such as obesity, type 2 diabetes, elevated lipids, hypertension, and heart disease) are avoidable through lifestyle improvements including healthier food choices, listening to satiety cues, and increased physical activity.  Sleep  Disorders Clinical staff conducted group or individual video education with verbal and written material and guidebook.  Patient learns how good quality and duration of sleep are important to overall health and  well-being. Patient also learns about sleep disorders and how they impact health along with recommendations to address them, including discussing with a physician.  Nutrition  Dining Out - Part 2 Clinical staff conducted group or individual video education with verbal and written material and guidebook.  Patient learns how to plan ahead and communicate in order to maximize their dining experience in a healthy and nutritious manner. Included are recommended food choices based on the type of restaurant the patient is visiting.   Fueling a Banker conducted group or individual video education with verbal and written material and guidebook.  There is a strong connection between our food choices and our health. Diseases like obesity and type 2 diabetes are very prevalent and are in large-part due to lifestyle choices. The Pritikin Eating Plan provides plenty of food and hunger-curbing satisfaction. It is easy to follow, affordable, and helps reduce health risks.  Menu Workshop  Clinical staff conducted group or individual video education with verbal and written material and guidebook.  Patient learns that restaurant meals can sabotage health goals because they are often packed with calories, fat, sodium, and sugar. Recommendations include strategies to plan ahead and to communicate with the manager, chef, or server to help order a healthier meal.  Planning Your Eating Strategy  Clinical staff conducted group or individual video education with verbal and written material and guidebook.  Patient learns about the Pritikin Eating Plan and its benefit of reducing the risk of disease. The Pritikin Eating Plan does not focus on calories. Instead, it emphasizes high-quality,  nutrient-rich foods. By knowing the characteristics of the foods, we choose, we can determine their calorie density and make informed decisions.  Targeting Your Nutrition Priorities  Clinical staff conducted group or individual video education with verbal and written material and guidebook.  Patient learns that lifestyle habits have a tremendous impact on disease risk and progression. This video provides eating and physical activity recommendations based on your personal health goals, such as reducing LDL cholesterol, losing weight, preventing or controlling type 2 diabetes, and reducing high blood pressure.  Vitamins and Minerals  Clinical staff conducted group or individual video education with verbal and written material and guidebook.  Patient learns different ways to obtain key vitamins and minerals, including through a recommended healthy diet. It is important to discuss all supplements you take with your doctor.   Healthy Mind-Set    Smoking Cessation  Clinical staff conducted group or individual video education with verbal and written material and guidebook.  Patient learns that cigarette smoking and tobacco addiction pose a serious health risk which affects millions of people. Stopping smoking will significantly reduce the risk of heart disease, lung disease, and many forms of cancer. Recommended strategies for quitting are covered, including working with your doctor to develop a successful plan.  Culinary   Becoming a Set designer conducted group or individual video education with verbal and written material and guidebook.  Patient learns that cooking at home can be healthy, cost-effective, quick, and puts them in control. Keys to cooking healthy recipes will include looking at your recipe, assessing your equipment needs, planning ahead, making it simple, choosing cost-effective seasonal ingredients, and limiting the use of added fats, salts, and sugars.  Cooking -  Breakfast and Snacks  Clinical staff conducted group or individual video education with verbal and written material and guidebook.  Patient learns how important breakfast is to satiety and nutrition through the entire day. Recommendations  include key foods to eat during breakfast to help stabilize blood sugar levels and to prevent overeating at meals later in the day. Planning ahead is also a key component.  Cooking - Educational psychologist conducted group or individual video education with verbal and written material and guidebook.  Patient learns eating strategies to improve overall health, including an approach to cook more at home. Recommendations include thinking of animal protein as a side on your plate rather than center stage and focusing instead on lower calorie dense options like vegetables, fruits, whole grains, and plant-based proteins, such as beans. Making sauces in large quantities to freeze for later and leaving the skin on your vegetables are also recommended to maximize your experience.  Cooking - Healthy Salads and Dressing Clinical staff conducted group or individual video education with verbal and written material and guidebook.  Patient learns that vegetables, fruits, whole grains, and legumes are the foundations of the Pritikin Eating Plan. Recommendations include how to incorporate each of these in flavorful and healthy salads, and how to create homemade salad dressings. Proper handling of ingredients is also covered. Cooking - Soups and [employer] - Soups and Desserts Clinical staff conducted group or individual video education with verbal and written material and guidebook.  Patient learns that Pritikin soups and desserts make for easy, nutritious, and delicious snacks and meal components that are low in sodium, fat, sugar, and calorie density, while high in vitamins, minerals, and filling fiber. Recommendations include simple and healthy ideas for soups and  desserts.   Overview     The Pritikin Solution Program Overview Clinical staff conducted group or individual video education with verbal and written material and guidebook.  Patient learns that the results of the Pritikin Program have been documented in more than 100 articles published in peer-reviewed journals, and the benefits include reducing risk factors for (and, in some cases, even reversing) high cholesterol, high blood pressure, type 2 diabetes, obesity, and more! An overview of the three key pillars of the Pritikin Program will be covered: eating well, doing regular exercise, and having a healthy mind-set.  WORKSHOPS  Exercise: Exercise Basics: Building Your Action Plan Clinical staff led group instruction and group discussion with PowerPoint presentation and patient guidebook. To enhance the learning environment the use of posters, models and videos may be added. At the conclusion of this workshop, patients will comprehend the difference between physical activity and exercise, as well as the benefits of incorporating both, into their routine. Patients will understand the FITT (Frequency, Intensity, Time, and Type) principle and how to use it to build an exercise action plan. In addition, safety concerns and other considerations for exercise and cardiac rehab will be addressed by the presenter. The purpose of this lesson is to promote a comprehensive and effective weekly exercise routine in order to improve patients' overall level of fitness.   Managing Heart Disease: Your Path to a Healthier Heart Clinical staff led group instruction and group discussion with PowerPoint presentation and patient guidebook. To enhance the learning environment the use of posters, models and videos may be added.At the conclusion of this workshop, patients will understand the anatomy and physiology of the heart. Additionally, they will understand how Pritikin's three pillars impact the risk factors, the  progression, and the management of heart disease.  The purpose of this lesson is to provide a high-level overview of the heart, heart disease, and how the Pritikin lifestyle positively impacts risk factors.  Exercise Biomechanics  Clinical staff led group instruction and group discussion with PowerPoint presentation and patient guidebook. To enhance the learning environment the use of posters, models and videos may be added. Patients will learn how the structural parts of their bodies function and how these functions impact their daily activities, movement, and exercise. Patients will learn how to promote a neutral spine, learn how to manage pain, and identify ways to improve their physical movement in order to promote healthy living. The purpose of this lesson is to expose patients to common physical limitations that impact physical activity. Participants will learn practical ways to adapt and manage aches and pains, and to minimize their effect on regular exercise. Patients will learn how to maintain good posture while sitting, walking, and lifting.  Balance Training and Fall Prevention  Clinical staff led group instruction and group discussion with PowerPoint presentation and patient guidebook. To enhance the learning environment the use of posters, models and videos may be added. At the conclusion of this workshop, patients will understand the importance of their sensorimotor skills (vision, proprioception, and the vestibular system) in maintaining their ability to balance as they age. Patients will apply a variety of balancing exercises that are appropriate for their current level of function. Patients will understand the common causes for poor balance, possible solutions to these problems, and ways to modify their physical environment in order to minimize their fall risk. The purpose of this lesson is to teach patients about the importance of maintaining balance as they age and ways to  minimize their risk of falling.  WORKSHOPS   Nutrition:  Fueling a Ship broker led group instruction and group discussion with PowerPoint presentation and patient guidebook. To enhance the learning environment the use of posters, models and videos may be added. Patients will review the foundational principles of the Pritikin Eating Plan and understand what constitutes a serving size in each of the food groups. Patients will also learn Pritikin-friendly foods that are better choices when away from home and review make-ahead meal and snack options. Calorie density will be reviewed and applied to three nutrition priorities: weight maintenance, weight loss, and weight gain. The purpose of this lesson is to reinforce (in a group setting) the key concepts around what patients are recommended to eat and how to apply these guidelines when away from home by planning and selecting Pritikin-friendly options. Patients will understand how calorie density may be adjusted for different weight management goals.  Mindful Eating  Clinical staff led group instruction and group discussion with PowerPoint presentation and patient guidebook. To enhance the learning environment the use of posters, models and videos may be added. Patients will briefly review the concepts of the Pritikin Eating Plan and the importance of low-calorie dense foods. The concept of mindful eating will be introduced as well as the importance of paying attention to internal hunger signals. Triggers for non-hunger eating and techniques for dealing with triggers will be explored. The purpose of this lesson is to provide patients with the opportunity to review the basic principles of the Pritikin Eating Plan, discuss the value of eating mindfully and how to measure internal cues of hunger and fullness using the Hunger Scale. Patients will also discuss reasons for non-hunger eating and learn strategies to use for controlling emotional  eating.  Targeting Your Nutrition Priorities Clinical staff led group instruction and group discussion with PowerPoint presentation and patient guidebook. To enhance the learning environment the use of posters, models and videos may be added. Patients  will learn how to determine their genetic susceptibility to disease by reviewing their family history. Patients will gain insight into the importance of diet as part of an overall healthy lifestyle in mitigating the impact of genetics and other environmental insults. The purpose of this lesson is to provide patients with the opportunity to assess their personal nutrition priorities by looking at their family history, their own health history and current risk factors. Patients will also be able to discuss ways of prioritizing and modifying the Pritikin Eating Plan for their highest risk areas  Menu  Clinical staff led group instruction and group discussion with PowerPoint presentation and patient guidebook. To enhance the learning environment the use of posters, models and videos may be added. Using menus brought in from [employer], or printed from [employer], patients will apply the Pritikin dining out guidelines that were presented in the [employer] video. Patients will also be able to practice these guidelines in a variety of provided scenarios. The purpose of this lesson is to provide patients with the opportunity to practice hands-on learning of the Pritikin Dining Out guidelines with actual menus and practice scenarios.  Label Reading Clinical staff led group instruction and group discussion with PowerPoint presentation and patient guidebook. To enhance the learning environment the use of posters, models and videos may be added. Patients will review and discuss the Pritikin label reading guidelines presented in Pritikin's Label Reading Educational series video. Using fool labels brought in from local grocery stores and  markets, patients will apply the label reading guidelines and determine if the packaged food meet the Pritikin guidelines. The purpose of this lesson is to provide patients with the opportunity to review, discuss, and practice hands-on learning of the Pritikin Label Reading guidelines with actual packaged food labels. Cooking School  Pritikin's [employer] are designed to teach patients ways to prepare quick, simple, and affordable recipes at home. The importance of nutrition's role in chronic disease risk reduction is reflected in its emphasis in the overall Pritikin program. By learning how to prepare essential core Pritikin Eating Plan recipes, patients will increase control over what they eat; be able to customize the flavor of foods without the use of added salt, sugar, or fat; and improve the quality of the food they consume. By learning a set of core recipes which are easily assembled, quickly prepared, and affordable, patients are more likely to prepare more healthy foods at home. These workshops focus on convenient breakfasts, simple entres, side dishes, and desserts which can be prepared with minimal effort and are consistent with nutrition recommendations for cardiovascular risk reduction. Cooking [employer] are taught by a Armed forces logistics/support/administrative officer (RD) who has been trained by the [employer]. The chef or RD has a clear understanding of the importance of minimizing - if not completely eliminating - added fat, sugar, and sodium in recipes. Throughout the series of Cooking School Workshop sessions, patients will learn about healthy ingredients and efficient methods of cooking to build confidence in their capability to prepare    Cooking School weekly topics:  Adding Flavor- Sodium-Free  Fast and Healthy Breakfasts  Powerhouse Plant-Based Proteins  Satisfying Salads and Dressings  Simple Sides and Sauces  International Cuisine-Spotlight on the [employer]  Zones  Delicious Desserts  Savory Soups  [employer] - Meals in a Astronomer Appetizers and Snacks  Comforting Weekend Breakfasts  One-Pot Wonders   Fast Evening Meals  Landscape architect Your  Pritikin Plate  WORKSHOPS   Healthy Mindset (Psychosocial):  Focused Goals, Sustainable Changes Clinical staff led group instruction and group discussion with PowerPoint presentation and patient guidebook. To enhance the learning environment the use of posters, models and videos may be added. Patients will be able to apply effective goal setting strategies to establish at least one personal goal, and then take consistent, meaningful action toward that goal. They will learn to identify common barriers to achieving personal goals and develop strategies to overcome them. Patients will also gain an understanding of how our mind-set can impact our ability to achieve goals and the importance of cultivating a positive and growth-oriented mind-set. The purpose of this lesson is to provide patients with a deeper understanding of how to set and achieve personal goals, as well as the tools and strategies needed to overcome common obstacles which may arise along the way.  From Head to Heart: The Power of a Healthy Outlook  Clinical staff led group instruction and group discussion with PowerPoint presentation and patient guidebook. To enhance the learning environment the use of posters, models and videos may be added. Patients will be able to recognize and describe the impact of emotions and mood on physical health. They will discover the importance of self-care and explore self-care practices which may work for them. Patients will also learn how to utilize the 4 C's to cultivate a healthier outlook and better manage stress and challenges. The purpose of this lesson is to demonstrate to patients how a healthy outlook is an essential part of maintaining good health, especially as they continue their  cardiac rehab journey.  Healthy Sleep for a Healthy Heart Clinical staff led group instruction and group discussion with PowerPoint presentation and patient guidebook. To enhance the learning environment the use of posters, models and videos may be added. At the conclusion of this workshop, patients will be able to demonstrate knowledge of the importance of sleep to overall health, well-being, and quality of life. They will understand the symptoms of, and treatments for, common sleep disorders. Patients will also be able to identify daytime and nighttime behaviors which impact sleep, and they will be able to apply these tools to help manage sleep-related challenges. The purpose of this lesson is to provide patients with a general overview of sleep and outline the importance of quality sleep. Patients will learn about a few of the most common sleep disorders. Patients will also be introduced to the concept of "sleep hygiene," and discover ways to self-manage certain sleeping problems through simple daily behavior changes. Finally, the workshop will motivate patients by clarifying the links between quality sleep and their goals of heart-healthy living.   Recognizing and Reducing Stress Clinical staff led group instruction and group discussion with PowerPoint presentation and patient guidebook. To enhance the learning environment the use of posters, models and videos may be added. At the conclusion of this workshop, patients will be able to understand the types of stress reactions, differentiate between acute and chronic stress, and recognize the impact that chronic stress has on their health. They will also be able to apply different coping mechanisms, such as reframing negative self-talk. Patients will have the opportunity to practice a variety of stress management techniques, such as deep abdominal breathing, progressive muscle relaxation, and/or guided imagery.  The purpose of this lesson is to educate  patients on the role of stress in their lives and to provide healthy techniques for coping with it.  Learning Barriers/Preferences:  Learning Barriers/Preferences -  01/09/23 1207       Learning Barriers/Preferences   Learning Barriers Sight;Hearing;Exercise Concerns   Pt wears glasses, hearing aides and has H/O falls   Learning Preferences Audio;Computer/Internet;Group Instruction;Individual Instruction;Pictoral;Skilled Demonstration;Verbal Instruction;Video;Written Material             Education Topics:  Knowledge Questionnaire Score:  Knowledge Questionnaire Score - 01/09/23 1208       Knowledge Questionnaire Score   Pre Score 16/24             Core Components/Risk Factors/Patient Goals at Admission:  Personal Goals and Risk Factors at Admission - 01/09/23 1210       Core Components/Risk Factors/Patient Goals on Admission    Weight Management Yes;Obesity;Weight Loss    Intervention Weight Management: Provide education and appropriate resources to help participant work on and attain dietary goals.;Weight Management: Develop a combined nutrition and exercise program designed to reach desired caloric intake, while maintaining appropriate intake of nutrient and fiber, sodium and fats, and appropriate energy expenditure required for the weight goal.;Weight Management/Obesity: Establish reasonable short term and long term weight goals.;Obesity: Provide education and appropriate resources to help participant work on and attain dietary goals.    Expected Outcomes Short Term: Continue to assess and modify interventions until short term weight is achieved;Long Term: Adherence to nutrition and physical activity/exercise program aimed toward attainment of established weight goal;Weight Loss: Understanding of general recommendations for a balanced deficit meal plan, which promotes 1-2 lb weight loss per week and includes a negative energy balance of [phone] kcal/d;Understanding  recommendations for meals to include 15-35% energy as protein, 25-35% energy from fat, 35-60% energy from carbohydrates, less than 200mg  of dietary cholesterol, 20-35 gm of total fiber daily;Understanding of distribution of calorie intake throughout the day with the consumption of 4-5 meals/snacks    Heart Failure Yes    Intervention Provide a combined exercise and nutrition program that is supplemented with education, support and counseling about heart failure. Directed toward relieving symptoms such as shortness of breath, decreased exercise tolerance, and extremity edema.    Expected Outcomes Improve functional capacity of life;Short term: Attendance in program 2-3 days a week with increased exercise capacity. Reported lower sodium intake. Reported increased fruit and vegetable intake. Reports medication compliance.;Short term: Daily weights obtained and reported for increase. Utilizing diuretic protocols set by physician.;Long term: Adoption of self-care skills and reduction of barriers for early signs and symptoms recognition and intervention leading to self-care maintenance.    Hypertension Yes    Intervention Provide education on lifestyle modifcations including regular physical activity/exercise, weight management, moderate sodium restriction and increased consumption of fresh fruit, vegetables, and low fat dairy, alcohol moderation, and smoking cessation.;Monitor prescription use compliance.    Expected Outcomes Short Term: Continued assessment and intervention until BP is < 140/58mm HG in hypertensive participants. < 130/20mm HG in hypertensive participants with diabetes, heart failure or chronic kidney disease.;Long Term: Maintenance of blood pressure at goal levels.    Lipids Yes    Intervention Provide education and support for participant on nutrition & aerobic/resistive exercise along with prescribed medications to achieve LDL 70mg , HDL >40mg .    Expected Outcomes Short Term: Participant  states understanding of desired cholesterol values and is compliant with medications prescribed. Participant is following exercise prescription and nutrition guidelines.;Long Term: Cholesterol controlled with medications as prescribed, with individualized exercise RX and with personalized nutrition plan. Value goals: LDL < 70mg , HDL > 40 mg.    Stress Yes    Intervention Offer individual  and/or small group education and counseling on adjustment to heart disease, stress management and health-related lifestyle change. Teach and support self-help strategies.;Refer participants experiencing significant psychosocial distress to appropriate mental health specialists for further evaluation and treatment. When possible, include family members and significant others in education/counseling sessions.    Expected Outcomes Short Term: Participant demonstrates changes in health-related behavior, relaxation and other stress management skills, ability to obtain effective social support, and compliance with psychotropic medications if prescribed.;Long Term: Emotional wellbeing is indicated by absence of clinically significant psychosocial distress or social isolation.    Personal Goal Other Yes    Personal Goal Long and shor the same: get back into exercise and gain strength    Intervention will continue to monitor pt and progress workloads as tolerated without sign or symptom    Expected Outcomes Pt will achieve her goals             Core Components/Risk Factors/Patient Goals Review:   Goals and Risk Factor Review     Row Name 02/18/23 1208             Core Components/Risk Factors/Patient Goals Review   Personal Goals Review Weight Management/Obesity;Heart Failure;Stress;Hypertension;Lipids       Review last day of exercise on 01/29/23. Patient says she plans to return on 02/18/23       Expected Outcomes Brittany Jackson will continue to participate in intensive cardiac rehab for exercise, nutrition and lifestyle  modifications                Core Components/Risk Factors/Patient Goals at Discharge (Final Review):   Goals and Risk Factor Review - 02/18/23 1208       Core Components/Risk Factors/Patient Goals Review   Personal Goals Review Weight Management/Obesity;Heart Failure;Stress;Hypertension;Lipids    Review last day of exercise on 01/29/23. Patient says she plans to return on 02/18/23    Expected Outcomes Brittany Jackson will continue to participate in intensive cardiac rehab for exercise, nutrition and lifestyle modifications             ITP Comments:  ITP Comments     Row Name 01/09/23 1155 01/21/23 1701 02/18/23 1203       ITP Comments Dr. Armanda Magic medical director. Introduction to pritikin education program/ intensive cardiac rehab. Initial orientation packet reviewed with patient. 30 Day ITP Reivew. Brittany Jackson hopes to start intensive cardiac rehab on 01/22/23 as she has been out with gout. 30 Day ITP Reivew. Yosselyn's last day of attendance at cardiac rehab was on 01/29/23. Brittany Jackson plans to return to exercise on 02/18/23              Comments: See ITP comments.Brittany Headings RN BSN

## 2023-02-18 NOTE — Telephone Encounter (Signed)
Spoke with the patient. Brittany Jackson says she plans to return to exercise on Friday.Brittany Jackson's last day of attendance was on 01/29/23.Gladstone Lighter, RN,BSN 02/18/2023 12:01 PM

## 2023-02-19 ENCOUNTER — Encounter (HOSPITAL_COMMUNITY): Payer: Medicare Other

## 2023-02-21 ENCOUNTER — Encounter (HOSPITAL_COMMUNITY): Payer: Medicare Other

## 2023-02-24 ENCOUNTER — Encounter (HOSPITAL_COMMUNITY)
Admission: RE | Admit: 2023-02-24 | Discharge: 2023-02-24 | Disposition: A | Discharge status: Home / Self Care | Payer: Medicare Other | Source: Ambulatory Visit | Attending: Cardiology | Admitting: Cardiology

## 2023-02-24 DIAGNOSIS — I252 Old myocardial infarction: Secondary | ICD-10-CM | POA: Insufficient documentation

## 2023-02-24 DIAGNOSIS — I213 ST elevation (STEMI) myocardial infarction of unspecified site: Secondary | ICD-10-CM | POA: Insufficient documentation

## 2023-02-24 DIAGNOSIS — Z48812 Encounter for surgical aftercare following surgery on the circulatory system: Secondary | ICD-10-CM | POA: Insufficient documentation

## 2023-02-24 DIAGNOSIS — I5032 Chronic diastolic (congestive) heart failure: Secondary | ICD-10-CM | POA: Insufficient documentation

## 2023-02-24 NOTE — Progress Notes (Signed)
Arianis returned to exercise at cardiac rehab after being absent for several days. Thayer Headings RN BSN

## 2023-02-26 ENCOUNTER — Other Ambulatory Visit (HOSPITAL_COMMUNITY): Payer: Self-pay | Admitting: Adult Health

## 2023-02-26 ENCOUNTER — Encounter (HOSPITAL_COMMUNITY): Payer: Medicare Other

## 2023-02-26 ENCOUNTER — Other Ambulatory Visit (HOSPITAL_COMMUNITY): Payer: Self-pay | Admitting: Cardiology

## 2023-02-27 ENCOUNTER — Telehealth (HOSPITAL_COMMUNITY): Payer: Self-pay | Admitting: Cardiology

## 2023-02-27 NOTE — Telephone Encounter (Signed)
Patient called to request samples of eliquis until she can get filled  Medication Samples have been provided to the patient.  Drug name: eliquis       Strength: 2.5        Qty: 28  LOT: [id number]  Exp.Date: 08/2023  Dosing instructions: ONE TAB TWICE A DAY  The patient has been instructed regarding the correct time, dose, and frequency of taking this medication, including desired effects and most common side effects.   Brittany Jackson M 11:10 AM 02/27/2023

## 2023-02-28 ENCOUNTER — Encounter (HOSPITAL_COMMUNITY): Payer: Medicare Other

## 2023-03-03 ENCOUNTER — Encounter (HOSPITAL_COMMUNITY): Payer: Medicare Other

## 2023-03-05 ENCOUNTER — Encounter (HOSPITAL_COMMUNITY): Payer: Medicare Other

## 2023-03-07 ENCOUNTER — Telehealth (HOSPITAL_COMMUNITY): Payer: Self-pay

## 2023-03-07 ENCOUNTER — Encounter (HOSPITAL_COMMUNITY): Payer: Medicare Other

## 2023-03-07 NOTE — Telephone Encounter (Signed)
Called pt to inform of no CRP2 due to telemetry downtime. No naswer, LM on VM.   Jonna Coup, MS, ACSM-CEP 03/07/2023 9:53 AM

## 2023-03-10 ENCOUNTER — Encounter (HOSPITAL_COMMUNITY): Payer: Self-pay | Admitting: *Deleted

## 2023-03-10 ENCOUNTER — Encounter (HOSPITAL_COMMUNITY): Payer: Medicare Other

## 2023-03-10 DIAGNOSIS — I5032 Chronic diastolic (congestive) heart failure: Secondary | ICD-10-CM

## 2023-03-10 NOTE — Progress Notes (Signed)
Discharge Progress Report  Patient Details  Name: Brittany Jackson MRN: [id number] Date of Birth: June 08, 1941 Referring Provider:   Flowsheet Row INTENSIVE CARDIAC REHAB ORIENT from 01/09/2023 in [facility]  Referring Provider Dr. Marca Ancona, MD        Number of Visits: 15  Reason for Discharge:  Non attendance  Smoking History:  Social History   Tobacco Use  Smoking Status Former   Current packs/day: 0.00   Average packs/day: 1 pack/day for 65.4 years (65.4 ttl pk-yrs)   Types: Cigarettes   Start date: 71   Quit date: 01/08/2022   Years since quitting: 1.1  Smokeless Tobacco Never    Diagnosis:  Heart failure, diastolic, chronic (HCC)  ADL UCSD:   Initial Exercise Prescription:  Initial Exercise Prescription - 01/09/23 1200       Date of Initial Exercise RX and Referring Provider   Date 01/09/23    Referring Provider Dr. Marca Ancona, MD    Expected Discharge Date 03/21/23      NuStep   Level 1    SPM 80    Minutes 20    METs 1.7      Prescription Details   Frequency (times per week) 3    Duration Progress to 30 minutes of continuous aerobic without signs/symptoms of physical distress      Intensity   THRR 40-80% of Max Heartrate 54-109    Ratings of Perceived Exertion 11-13    Perceived Dyspnea 0-4      Progression   Progression Continue to progress workloads to maintain intensity without signs/symptoms of physical distress.      Resistance Training   Training Prescription Yes    Weight 2    Reps 10-15             Discharge Exercise Prescription (Final Exercise Prescription Changes):  Exercise Prescription Changes - 01/22/23 1634       Response to Exercise   Blood Pressure (Admit) 144/84    Blood Pressure (Exercise) 184/74   DEC WL recheck 180/56   Blood Pressure (Exit) 110/58    Heart Rate (Admit) 65 bpm    Heart Rate (Exercise) 105 bpm    Heart Rate (Exit) 74 bpm     Rating of Perceived Exertion (Exercise) 13    Perceived Dyspnea (Exercise) 0    Symptoms elevated BP with exercise    Comments Pt first day in teh [facility] ICR program    Duration Progress to 30 minutes of  aerobic without signs/symptoms of physical distress    Intensity THRR unchanged      Progression   Progression Continue to progress workloads to maintain intensity without signs/symptoms of physical distress.    Average METs 1.8      Resistance Training   Training Prescription Yes    Weight 2    Reps 10-15    Time 10 Minutes      NuStep   Level 1    Minutes 20    METs 1.8             Functional Capacity:  6 Minute Walk     Row Name 01/09/23 1156         6 Minute Walk   Phase Initial     Distance 840 feet     Walk Time 6 minutes     # of Rest Breaks 2  Both standing rest breaks due to hip pain and fatigue  3.24-3.44 and 1.07-1.28     MPH 1.59     METS 0.69     RPE 12     Perceived Dyspnea  0     VO2 Peak 2.41     Symptoms Yes (comment)     Comments Chronic bilateral hip pain, left toe gout pain, and fatigue 3/10 pain     Resting HR 79 bpm     Resting BP 120/60     Resting Oxygen Saturation  98 %     Exercise Oxygen Saturation  during 6 min walk 99 %     Max Ex. HR 106 bpm     Max Ex. BP 150/74     2 Minute Post BP 134/64              6 Minute Walk     Row Name 01/09/23 1156         6 Minute Walk   Phase Initial     Distance 840 feet     Walk Time 6 minutes     # of Rest Breaks 2  Both standing rest breaks due to hip pain and fatigue 3.24-3.44 and 1.07-1.28     MPH 1.59     METS 0.69     RPE 12     Perceived Dyspnea  0     VO2 Peak 2.41     Symptoms Yes (comment)     Comments Chronic bilateral hip pain, left toe gout pain, and fatigue 3/10 pain     Resting HR 79 bpm     Resting BP 120/60     Resting Oxygen Saturation  98 %     Exercise Oxygen Saturation  during 6 min walk 99 %     Max Ex. HR 106 bpm     Max Ex. BP 150/74     2 Minute  Post BP 134/64              Psychological, QOL, Others - Outcomes: PHQ 2/9:    01/09/2023   11:53 AM 09/26/2022    3:54 PM  Depression screen PHQ 2/9  Decreased Interest 2 0  Down, Depressed, Hopeless 1 1  PHQ - 2 Score 3 1  Altered sleeping 0 0  Tired, decreased energy 2 2  Change in appetite 2 3  Feeling bad or failure about yourself  1 2  Trouble concentrating 0 0  Moving slowly or fidgety/restless 0 1  Suicidal thoughts 0 0  PHQ-9 Score 8 9  Difficult doing work/chores Not difficult at all Not difficult at all    Quality of Life:  Quality of Life - 01/09/23 1206       Quality of Life   Select Quality of Life      Quality of Life Scores   Health/Function Pre 19.85 %    Socioeconomic Pre 27.83 %    Psych/Spiritual Pre 27.43 %    Family Pre 12 %    GLOBAL Pre 22.52 %             Personal Goals: Goals established at orientation with interventions provided to work toward goal.  Personal Goals and Risk Factors at Admission - 01/09/23 1210       Core Components/Risk Factors/Patient Goals on Admission    Weight Management Yes;Obesity;Weight Loss    Intervention Weight Management: Provide education and appropriate resources to help participant work on and attain dietary goals.;Weight Management: Develop a combined nutrition and exercise program designed to reach  desired caloric intake, while maintaining appropriate intake of nutrient and fiber, sodium and fats, and appropriate energy expenditure required for the weight goal.;Weight Management/Obesity: Establish reasonable short term and long term weight goals.;Obesity: Provide education and appropriate resources to help participant work on and attain dietary goals.    Expected Outcomes Short Term: Continue to assess and modify interventions until short term weight is achieved;Long Term: Adherence to nutrition and physical activity/exercise program aimed toward attainment of established weight goal;Weight Loss:  Understanding of general recommendations for a balanced deficit meal plan, which promotes 1-2 lb weight loss per week and includes a negative energy balance of [id number] kcal/d;Understanding recommendations for meals to include 15-35% energy as protein, 25-35% energy from fat, 35-60% energy from carbohydrates, less than 200mg  of dietary cholesterol, 20-35 gm of total fiber daily;Understanding of distribution of calorie intake throughout the day with the consumption of 4-5 meals/snacks    Heart Failure Yes    Intervention Provide a combined exercise and nutrition program that is supplemented with education, support and counseling about heart failure. Directed toward relieving symptoms such as shortness of breath, decreased exercise tolerance, and extremity edema.    Expected Outcomes Improve functional capacity of life;Short term: Attendance in program 2-3 days a week with increased exercise capacity. Reported lower sodium intake. Reported increased fruit and vegetable intake. Reports medication compliance.;Short term: Daily weights obtained and reported for increase. Utilizing diuretic protocols set by physician.;Long term: Adoption of self-care skills and reduction of barriers for early signs and symptoms recognition and intervention leading to self-care maintenance.    Hypertension Yes    Intervention Provide education on lifestyle modifcations including regular physical activity/exercise, weight management, moderate sodium restriction and increased consumption of fresh fruit, vegetables, and low fat dairy, alcohol moderation, and smoking cessation.;Monitor prescription use compliance.    Expected Outcomes Short Term: Continued assessment and intervention until BP is < 140/44mm HG in hypertensive participants. < 130/64mm HG in hypertensive participants with diabetes, heart failure or chronic kidney disease.;Long Term: Maintenance of blood pressure at goal levels.    Lipids Yes    Intervention Provide  education and support for participant on nutrition & aerobic/resistive exercise along with prescribed medications to achieve LDL 70mg , HDL >40mg .    Expected Outcomes Short Term: Participant states understanding of desired cholesterol values and is compliant with medications prescribed. Participant is following exercise prescription and nutrition guidelines.;Long Term: Cholesterol controlled with medications as prescribed, with individualized exercise RX and with personalized nutrition plan. Value goals: LDL < 70mg , HDL > 40 mg.    Stress Yes    Intervention Offer individual and/or small group education and counseling on adjustment to heart disease, stress management and health-related lifestyle change. Teach and support self-help strategies.;Refer participants experiencing significant psychosocial distress to appropriate mental health specialists for further evaluation and treatment. When possible, include family members and significant others in education/counseling sessions.    Expected Outcomes Short Term: Participant demonstrates changes in health-related behavior, relaxation and other stress management skills, ability to obtain effective social support, and compliance with psychotropic medications if prescribed.;Long Term: Emotional wellbeing is indicated by absence of clinically significant psychosocial distress or social isolation.    Personal Goal Other Yes    Personal Goal Long and shor the same: get back into exercise and gain strength    Intervention will continue to monitor pt and progress workloads as tolerated without sign or symptom    Expected Outcomes Pt will achieve her goals  Personal Goals Discharge:  Goals and Risk Factor Review     Row Name 02/18/23 1208             Core Components/Risk Factors/Patient Goals Review   Personal Goals Review Weight Management/Obesity;Heart Failure;Stress;Hypertension;Lipids       Review last day of exercise on 01/29/23.  Patient says she plans to return on 02/18/23       Expected Outcomes Brittany Jackson will continue to participate in intensive cardiac rehab for exercise, nutrition and lifestyle modifications                Goals and Risk Factor Review     Row Name 02/18/23 1208             Core Components/Risk Factors/Patient Goals Review   Personal Goals Review Weight Management/Obesity;Heart Failure;Stress;Hypertension;Lipids       Review last day of exercise on 01/29/23. Patient says she plans to return on 02/18/23       Expected Outcomes Brittany Jackson will continue to participate in intensive cardiac rehab for exercise, nutrition and lifestyle modifications                Exercise Goals and Review:  Exercise Goals     Row Name 01/09/23 1202             Exercise Goals   Increase Physical Activity Yes       Intervention Provide advice, education, support and counseling about physical activity/exercise needs.;Develop an individualized exercise prescription for aerobic and resistive training based on initial evaluation findings, risk stratification, comorbidities and participant's personal goals.       Expected Outcomes Short Term: Attend rehab on a regular basis to increase amount of physical activity.;Long Term: Exercising regularly at least 3-5 days a week.;Long Term: Add in home exercise to make exercise part of routine and to increase amount of physical activity.       Increase Strength and Stamina Yes       Intervention Provide advice, education, support and counseling about physical activity/exercise needs.;Develop an individualized exercise prescription for aerobic and resistive training based on initial evaluation findings, risk stratification, comorbidities and participant's personal goals.       Expected Outcomes Short Term: Increase workloads from initial exercise prescription for resistance, speed, and METs.;Short Term: Perform resistance training exercises routinely during rehab and add in  resistance training at home;Long Term: Improve cardiorespiratory fitness, muscular endurance and strength as measured by increased METs and functional capacity ( )       Able to understand and use rate of perceived exertion (RPE) scale Yes       Intervention Provide education and explanation on how to use RPE scale       Expected Outcomes Short Term: Able to use RPE daily in rehab to express subjective intensity level;Long Term:  Able to use RPE to guide intensity level when exercising independently       Knowledge and understanding of Target Heart Rate Range (THRR) Yes       Intervention Provide education and explanation of THRR including how the numbers were predicted and where they are located for reference       Expected Outcomes Short Term: Able to state/look up THRR;Long Term: Able to use THRR to govern intensity when exercising independently;Short Term: Able to use daily as guideline for intensity in rehab       Understanding of Exercise Prescription Yes       Intervention Provide education, explanation, and written materials on patient's individual  exercise prescription       Expected Outcomes Short Term: Able to explain program exercise prescription;Long Term: Able to explain home exercise prescription to exercise independently                Exercise Goals     Row Name 01/09/23 1202             Exercise Goals   Increase Physical Activity Yes       Intervention Provide advice, education, support and counseling about physical activity/exercise needs.;Develop an individualized exercise prescription for aerobic and resistive training based on initial evaluation findings, risk stratification, comorbidities and participant's personal goals.       Expected Outcomes Short Term: Attend rehab on a regular basis to increase amount of physical activity.;Long Term: Exercising regularly at least 3-5 days a week.;Long Term: Add in home exercise to make exercise part of routine and to  increase amount of physical activity.       Increase Strength and Stamina Yes       Intervention Provide advice, education, support and counseling about physical activity/exercise needs.;Develop an individualized exercise prescription for aerobic and resistive training based on initial evaluation findings, risk stratification, comorbidities and participant's personal goals.       Expected Outcomes Short Term: Increase workloads from initial exercise prescription for resistance, speed, and METs.;Short Term: Perform resistance training exercises routinely during rehab and add in resistance training at home;Long Term: Improve cardiorespiratory fitness, muscular endurance and strength as measured by increased METs and functional capacity ( )       Able to understand and use rate of perceived exertion (RPE) scale Yes       Intervention Provide education and explanation on how to use RPE scale       Expected Outcomes Short Term: Able to use RPE daily in rehab to express subjective intensity level;Long Term:  Able to use RPE to guide intensity level when exercising independently       Knowledge and understanding of Target Heart Rate Range (THRR) Yes       Intervention Provide education and explanation of THRR including how the numbers were predicted and where they are located for reference       Expected Outcomes Short Term: Able to state/look up THRR;Long Term: Able to use THRR to govern intensity when exercising independently;Short Term: Able to use daily as guideline for intensity in rehab       Understanding of Exercise Prescription Yes       Intervention Provide education, explanation, and written materials on patient's individual exercise prescription       Expected Outcomes Short Term: Able to explain program exercise prescription;Long Term: Able to explain home exercise prescription to exercise independently                Exercise Goals Re-Evaluation:  Exercise Goals Re-Evaluation     Row  Name 01/22/23 1630             Exercise Goal Re-Evaluation   Exercise Goals Review Increase Physical Activity;Understanding of Exercise Prescription;Increase Strength and Stamina;Knowledge and understanding of Target Heart Rate Range (THRR);Able to understand and use rate of perceived exertion (RPE) scale       Comments Pt was discharged, but has returned, Pt new first day in the CRP2 program, Pt tolerated exercise well with an average MET level of 1.8. Pt did have elevated BP, DEC WL with exercise. Pt is relearning her THRR, RPE and ExRx  Expected Outcomes Will continue to monitor pt and progress workloads as tolerated without sign or symptom.                Exercise Goals Re-Evaluation     Row Name 01/22/23 1630             Exercise Goal Re-Evaluation   Exercise Goals Review Increase Physical Activity;Understanding of Exercise Prescription;Increase Strength and Stamina;Knowledge and understanding of Target Heart Rate Range (THRR);Able to understand and use rate of perceived exertion (RPE) scale       Comments Pt was discharged, but has returned, Pt new first day in the CRP2 program, Pt tolerated exercise well with an average MET level of 1.8. Pt did have elevated BP, DEC WL with exercise. Pt is relearning her THRR, RPE and ExRx       Expected Outcomes Will continue to monitor pt and progress workloads as tolerated without sign or symptom.                Nutrition & Weight - Outcomes:  Pre Biometrics - 01/09/23 1202       Pre Biometrics   Height 5\' 5"  (1.651 m)    Weight 249 lb 5.4 oz (113.1 kg)    Waist Circumference 51.5 inches    Hip Circumference 57.5 inches    Waist to Hip Ratio 0.9 %    BMI (Calculated) 41.49    Triceps Skinfold 35 mm    Grip Strength 27 kg    Flexibility --   Did not attempt due to pain   Single Leg Stand 1.2 seconds              Nutrition:  Nutrition Therapy & Goals - 02/21/23 1123       Nutrition Therapy   Diet Heart  Healthy Diet    Drug/Food Interactions Statins/Certain Fruits      Personal Nutrition Goals   Nutrition Goal Patient to identify strategies for reducing cardiovascular risk by attending the [facility] education and nutrition series weekly.    Personal Goal #2 Patient to improve diet quality by using the plate method as a guide for meal planning to include lean protein/plant protein, fruits, vegetables, whole grains, nonfat dairy as part of a well-balanced diet.    Personal Goal #3 Patient to limit sodium to 1500mg  per day.    Comments Lagena has previously started Intensive Cardiac Rehab in February 2024 but was unable to complete. She has not attended cardiac rehab since 01/29/23 and has only attended 3 exercise sessions and 1 education session.   Per heart failure clinic notes, between 08/26/22 visit and 12/27/22 visit she was up 14#. She continues regular follow-up with nephrology. She remains pre-contemplative toward dietary changes; will continue to follow-up on goals upon patient's return. Patient will benefit from participation in intensive cardiac rehab for nutrition, exercise, and lifestyle modification.      Intervention Plan   Intervention Prescribe, educate and counsel regarding individualized specific dietary modifications aiming towards targeted core components such as weight, hypertension, lipid management, diabetes, heart failure and other comorbidities.;Nutrition handout(s) given to patient.    Expected Outcomes Short Term Goal: Understand basic principles of dietary content, such as calories, fat, sodium, cholesterol and nutrients.;Long Term Goal: Adherence to prescribed nutrition plan.             Nutrition Discharge:   Education Questionnaire Score:  Knowledge Questionnaire Score - 01/09/23 1208       Knowledge Questionnaire Score   Pre Score 16/24  Brittany Jackson attended 15 exercise sessions between 01/09/23- 02/24/23. Brittany Jackson did well with exercise when in  attendance. Brittany Jackson's attendance was poor. Brittany Jackson stopped participating in cardiac rehab due to a family emergency.Thayer Headings RN BSN

## 2023-03-12 ENCOUNTER — Encounter (HOSPITAL_COMMUNITY): Payer: Medicare Other

## 2023-03-14 ENCOUNTER — Encounter (HOSPITAL_COMMUNITY): Payer: Medicare Other

## 2023-03-17 ENCOUNTER — Encounter (HOSPITAL_COMMUNITY): Payer: Medicare Other

## 2023-03-19 ENCOUNTER — Encounter (HOSPITAL_COMMUNITY): Payer: Medicare Other

## 2023-03-21 ENCOUNTER — Encounter (HOSPITAL_COMMUNITY): Payer: Medicare Other

## 2023-04-02 ENCOUNTER — Encounter (HOSPITAL_COMMUNITY): Payer: Self-pay

## 2023-04-02 ENCOUNTER — Ambulatory Visit (HOSPITAL_COMMUNITY)
Admission: RE | Admit: 2023-04-02 | Discharge: 2023-04-02 | Disposition: A | Discharge status: Home / Self Care | Payer: Medicare Other | Source: Ambulatory Visit | Attending: Internal Medicine | Admitting: Internal Medicine

## 2023-04-02 ENCOUNTER — Encounter (HOSPITAL_COMMUNITY): Payer: Medicare Other

## 2023-04-02 VITALS — BP 132/66 | HR 62 | Wt 255.8 lb

## 2023-04-02 DIAGNOSIS — I5032 Chronic diastolic (congestive) heart failure: Secondary | ICD-10-CM | POA: Diagnosis not present

## 2023-04-02 DIAGNOSIS — I5022 Chronic systolic (congestive) heart failure: Secondary | ICD-10-CM

## 2023-04-02 DIAGNOSIS — I2510 Atherosclerotic heart disease of native coronary artery without angina pectoris: Secondary | ICD-10-CM | POA: Insufficient documentation

## 2023-04-02 DIAGNOSIS — I480 Paroxysmal atrial fibrillation: Secondary | ICD-10-CM | POA: Insufficient documentation

## 2023-04-02 DIAGNOSIS — I130 Hypertensive heart and chronic kidney disease with heart failure and stage 1 through stage 4 chronic kidney disease, or unspecified chronic kidney disease: Secondary | ICD-10-CM | POA: Diagnosis not present

## 2023-04-02 DIAGNOSIS — Z87891 Personal history of nicotine dependence: Secondary | ICD-10-CM | POA: Diagnosis not present

## 2023-04-02 DIAGNOSIS — J449 Chronic obstructive pulmonary disease, unspecified: Secondary | ICD-10-CM | POA: Diagnosis not present

## 2023-04-02 DIAGNOSIS — N183 Chronic kidney disease, stage 3 unspecified: Secondary | ICD-10-CM | POA: Diagnosis not present

## 2023-04-02 DIAGNOSIS — I110 Hypertensive heart disease with heart failure: Secondary | ICD-10-CM | POA: Diagnosis present

## 2023-04-02 DIAGNOSIS — I429 Cardiomyopathy, unspecified: Secondary | ICD-10-CM | POA: Diagnosis not present

## 2023-04-02 LAB — BASIC METABOLIC PANEL
Anion gap: 9 (ref 5–15)
BUN: 32 mg/dL — ABNORMAL HIGH (ref 8–23)
CO2: 23 mmol/L (ref 22–32)
Calcium: 8.9 mg/dL (ref 8.9–10.3)
Chloride: 106 mmol/L (ref 98–111)
Creatinine, Ser: 1.91 mg/dL — ABNORMAL HIGH (ref 0.44–1.00)
GFR, Estimated: 26 mL/min — ABNORMAL LOW (ref >60)
Glucose, Bld: 90 mg/dL (ref 70–99)
Potassium: 4 mmol/L (ref 3.5–5.1)
Sodium: 138 mmol/L (ref 135–145)

## 2023-04-02 LAB — BRAIN NATRIURETIC PEPTIDE: B Natriuretic Peptide: 160.6 pg/mL — ABNORMAL HIGH (ref 0.0–100.0)

## 2023-04-02 NOTE — Progress Notes (Signed)
PCP: Elfredia Nevins, MD HF Cardiologist: Dr Shirlee Latch Nephrology:  [facility]    HPI: Ms Koors is a 82 y.o. female with history of chronic diastolic CHF, tobacco use, HTN, former smoker, and COPD.   Admitted 01/08/22 with inferolateral STEMI c/b acute systolic CHF with low-output. No intervention initially. She was seen by CT surgery, CABG not recommended.  Hospital course c/b AKI on CKD Stage IIIa, HAP, and leukocytosis. Briefly on CVVHD and had gradual improvement. Creatinine at d/c down to 2.0.  Developed rash felt to be in the setting of amiodarone. Treated with steroids and switched to amio without dye with improvement. Discharged on 01/22/22. She had paroxysmal atrial fibrillation during this admission.   Echo in 8/23 showed EF 65-70%, normal RV, mild MR, IVC normal. CT chest in 9/23 showed emphysema.   Follow up 10/23, NYHA I-II, BP stable so midodrine stopped. SGLT2i started and torsemide decreased to 20 mg every other day.  Called clinic 07/30/22 with complaints of dizziness.  Seen in AHF clinic 5/24 for acute visit with complaints of dizziness. ReDS stable. Suspected transient positional dizziness.   Today she returns for AHF follow up. Overall feeling pissed off d/t weight gain. Denies palpitations, CP, dizziness, edema, or PND/Orthopnea. SOB with excessive exercise. Appetite fair, refusing to eat much until she starts to lose weight. No fever or chills. Weight at home 249 pounds. Does not check BP at home. Taking all medications. Discharged from cardiac rehab d/t lack of attendance.   ECG (personally reviewed from 5/24): NSR with 1AVB, 65 bpm  Labs (7/23): K 4.6, creatinine 2.4, BNP 48 Labs (8/23): LDL 71, HDL 56, hgb 10.8, TSH normal, K 4.5, creatinine 2.05 Labs (10/23): LFTs normal, TSH normal Labs (11/23): K 4.2, creatinine 2.12 Labs (5/24): K 3.7, SCr 2.1, TSH 1.2, LDL 63, A1c 5.7  PMH: 1. Inflammatory dermatosis 2. HTN 3. CKD stage 3 4. COPD: Smoker until 5/23.   Obstructive PFTs in 5/23.  - CT chest (9/23) with emphysema.  5. Atrial fibrillation: Paroxysmal.  6. Cardiomyopathy: Initially thought to be ischemic but suspect stress (Takotsubo-type) CMP given rapid improvement.  - Echo (5/23): EF 20-25%, mildly decreased RV systolic function, trivial MR, dilated IVC.  - Echo (5/23, repeat): improvement in EF to 65-70%  - Echo (8/23): EF 65-70%, normal RV, mild MR, IVC normal. 7. CAD: inferolateral STEMI by ECG 5/23 with HS-TnI in 3000 range, LHC with 3VD => 50-60% dLM, ?90% ostial LAD, 75% stenosis at bifurcation of mLAD and D, 75% stenosis at bifurcation of mLCx and OM.  The ostial LAD is not completely laid out and on review, I am not sure that it is truly critical.   ROS: All systems negative except as listed in HPI, PMH and Problem List.  SH:  Social History   Socioeconomic History   Marital status: Widowed    Spouse name: Not on file   Number of children: Not on file   Years of education: Not on file   Highest education level: Not on file  Occupational History   Not on file  Tobacco Use   Smoking status: Former    Current packs/day: 0.00    Average packs/day: 1 pack/day for 65.4 years (65.4 ttl pk-yrs)    Types: Cigarettes    Start date: 20    Quit date: 01/08/2022    Years since quitting: 1.2   Smokeless tobacco: Never  Vaping Use   Vaping status: Never Used  Substance and Sexual Activity   Alcohol  use: Yes    Comment: occasional   Drug use: No   Sexual activity: Never    Birth control/protection: Surgical  Other Topics Concern   Not on file  Social History Narrative   Not on file   Social Determinants of Health   Financial Resource Strain: Not on file  Food Insecurity: No Food Insecurity (01/22/2022)   Hunger Vital Sign    Worried About Running Out of Food in the Last Year: Never true    Ran Out of Food in the Last Year: Never true  Transportation Needs: No Transportation Needs (01/22/2022)   PRAPARE - Therapist, art (Medical): No    Lack of Transportation (Non-Medical): No  Physical Activity: Not on file  Stress: Not on file  Social Connections: Not on file  Intimate Partner Violence: Not on file   FH:  Family History  Problem Relation Age of Onset   Hypertension Sister    Allergic rhinitis Neg Hx    Asthma Neg Hx    Angioedema Neg Hx    Atopy Neg Hx    Eczema Neg Hx    Immunodeficiency Neg Hx    Urticaria Neg Hx    Past Medical History:  Diagnosis Date   CHF (congestive heart failure) (HCC)    Hypertension    Urticaria    Current Outpatient Medications  Medication Sig Dispense Refill   acetaminophen (TYLENOL) 500 MG tablet Take 500-1,000 mg by mouth every 6 (six) hours as needed (pain.).     albuterol (VENTOLIN HFA) 108 (90 Base) MCG/ACT inhaler Inhale 2 puffs into the lungs every 6 (six) hours as needed for wheezing or shortness of breath. 8 g 6   amiodarone (PACERONE) 200 MG tablet Take 0.5 tablets (100 mg total) by mouth daily. 45 tablet 3   apixaban (ELIQUIS) 2.5 MG TABS tablet Take 1 tablet (2.5 mg total) by mouth 2 (two) times daily. 60 tablet 11   atorvastatin (LIPITOR) 80 MG tablet TAKE ONE TABLET BY MOUTH DAILY 90 tablet 3   colchicine 0.6 MG tablet Take 0.6 mg by mouth 2 (two) times daily as needed (onset of gout).     diazepam (VALIUM) 10 MG tablet Take 1 tablet (10 mg total) by mouth 3 (three) times daily as needed for anxiety. 5 tablet 0   empagliflozin (JARDIANCE) 10 MG TABS tablet Take 1 tablet (10 mg total) by mouth daily before breakfast. 30 tablet 11   hydrocortisone cream 1 % Apply topically as needed for itching. 28 g 0   Multiple Vitamins-Minerals (ADULT ONE DAILY GUMMIES PO) Take 2 tablets by mouth in the morning.     torsemide (DEMADEX) 20 MG tablet Take 1 tablet (20 mg total) by mouth every other day. 45 tablet 3   Cyanocobalamin POWD Take by mouth. (Patient not taking: Reported on 01/09/2023)     No current facility-administered medications  for this encounter.   BP 132/66   Pulse 62   Wt 116 kg (255 lb 12.8 oz)   SpO2 97%   BMI 42.57 kg/m   Wt Readings from Last 3 Encounters:  04/02/23 116 kg (255 lb 12.8 oz)  01/24/23 112.5 kg (248 lb)  01/09/23 113.1 kg (249 lb 5.4 oz)   PHYSICAL EXAM: General:  well appearing.  No respiratory difficulty. Walked into clinic HEENT: normal Neck: supple. JVD ~10. Carotids 2+ bilat; no bruits. No lymphadenopathy or thyromegaly appreciated. Cor: PMI nondisplaced. Regular rate & rhythm. No rubs, gallops or  murmurs. Lungs: clear Abdomen: soft, nontender, nondistended. No hepatosplenomegaly. No bruits or masses. Good bowel sounds. Extremities: no cyanosis, clubbing, rash, +1 BLE edema  Neuro: alert & oriented x 3, cranial nerves grossly intact. moves all 4 extremities w/o difficulty. Affect pleasant.   ASSESSMENT/PLAN: 1. CAD: Patient presented with ACS in 5/23, peak HS-TnI 3557. LHC with 3VD => 50-60% dLM, ?90% ostial LAD, 75% stenosis at bifurcation of mLAD and D, 75% stenosis at bifurcation of mLCx and OM.  The ostial LAD is not completely laid out and on review, not sure that it is truly critical.  CABG would be a consideration if LAD disease is severe, may require FFR to determine this. PCI distal left main into LAD also an option if FFR shows severe disease.  She has had no ischemic chest pain or dyspnea and EF had recovered on last echo in 8/23. Think we can hold off on further evaluation for now, especially as creatinine remains elevated (last creatinine was 2.05).  - Not on ASA given apixaban use.      - Continue high intensity statin, Lipids ok 5/24.  - Dismissed from Cardiac rehab due to not attending 2. Cardiomyopathy: Possible stress (Takotsubo-type) cardiomyopathy. Patient has CAD as above, but not sure it explains the extent of her initial cardiomyopathy in 5/23 and her rapid improvement.  Low output on initial RHC in 5/23 with CI 1.8.  Echo in 5/23 initially with EF 20-25%, mildly  decreased RV systolic function, trivial MR, dilated IVC. Repeat echo later in 5/23 with improvement in EF to 65-70% => suspect Takotsubo CMP.  Echo in 8/23 showed EF 65-70%, normal RV, mild MR, IVC normal.  ReDS 34% - NYHA class II symptoms.  Slightly volume overloaded on exam.  - Increase torsemide to 20 mg daily Thur-Sunday then resume every other day dosing. BMET and BNP today. - Continue Jardiance 10 mg daily. No GU symptoms. - Off midodrine with stable BP. 3.  Atrial fibrillation: Paroxysmal.   - Continue Eliquis.  No bleeding issues. - Continue amiodarone 100 mg daily. LFTs/TSH ok 5/24, will need regular eye exam.  4.  CKD Stage 3: AKI in 5/23 with transient CVVH.  Last creatinine 2.1.  - She followed with Nephrology. - BMET today. 5. COPD: Patient has quit smoking.  - She has followup with pulmonary.   Will reach out to pharmacy team, patient asking about status of Mounjaro.   Follow up in 6 weeks with APP to reassess fluid status. Follow up 4 months with Dr. Thressa Sheller AGACNP-BC  04/02/2023

## 2023-04-02 NOTE — Patient Instructions (Addendum)
Thank you for coming in today  If you had labs drawn today, any labs that are abnormal the clinic will call you No news is good news  Medications: Take Torsemide 20 mg daily starting 04/03/2023 to 04/06/2023 Thursday to Sunday Then 04/07/2023 Monday Resume 20 mg every other day  Follow up appointments:  Your physician recommends that you schedule a follow-up appointment in:  6 weeks in clinic 4 months With Dr. Shirlee Latch    Do the following things EVERYDAY: Weigh yourself in the morning before breakfast. Write it down and keep it in a log. Take your medicines as prescribed Eat low salt foods--Limit salt (sodium) to 2000 mg per day.  Stay as active as you can everyday Limit all fluids for the day to less than 2 liters   At the [facility], you and your health needs are our priority. As part of our continuing mission to provide you with exceptional heart care, we have created designated Provider Care Teams. These Care Teams include your primary Cardiologist (physician) and Advanced Practice Providers (APPs- Physician Assistants and Nurse Practitioners) who all work together to provide you with the care you need, when you need it.   You may see any of the following providers on your designated Care Team at your next follow up: Dr Arvilla Meres Dr Marca Ancona Dr. Marcos Eke, NP Robbie Lis, [facility] [place], [place] Brynda Peon, NP Karle Plumber, PharmD   Please be sure to bring in all your medications bottles to every appointment.    Thank you for choosing [facility]  If you have any questions or concerns before your next appointment please send [place] a message through [place] or call our office at [phone].    TO LEAVE A MESSAGE FOR THE NURSE SELECT OPTION 2, PLEASE LEAVE A MESSAGE INCLUDING: YOUR NAME DATE OF BIRTH CALL BACK NUMBER REASON FOR CALL**this is important as we  prioritize the call backs  YOU WILL RECEIVE A CALL BACK THE SAME DAY AS LONG AS YOU CALL BEFORE 4:00 PM

## 2023-04-02 NOTE — Progress Notes (Signed)
ReDS Vest / Clip - 04/02/23 1500       ReDS Vest / Clip   Station Marker D    Ruler Value 34    ReDS Value Range Low volume    ReDS Actual Value 34

## 2023-04-03 ENCOUNTER — Encounter (HOSPITAL_COMMUNITY): Payer: Medicare Other

## 2023-04-04 ENCOUNTER — Telehealth: Payer: Self-pay | Admitting: Student

## 2023-04-04 NOTE — Telephone Encounter (Signed)
Pt returning call

## 2023-04-04 NOTE — Telephone Encounter (Signed)
Called. NA. LVM.

## 2023-04-04 NOTE — Telephone Encounter (Addendum)
Spoke to patient.  Inform her insurance coverage for [employer] - they are excluded from the formulary. Her A1c 5.7 - prediabetic range patient does not qualify for Ozempic or Mounjaro. Patient was interested in knowing cash price for  [employer] provided - $550 per month for Zepbound with coupon for patients who has Nurse, learning disability and the medication is not covered. Coupon will not work with Medicaid or medicare patient.  Patient said she will talk to her daughter and if interested will call back.

## 2023-04-04 NOTE — Telephone Encounter (Signed)
Pt c/o medication issue:  1. Name of Medication: Mounjaro   2. How are you currently taking this medication (dosage and times per day)?   3. Are you having a reaction (difficulty breathing--STAT)? No  4. What is your medication issue? Patient is calling because she would like start the [facility] medication.

## 2023-04-07 ENCOUNTER — Other Ambulatory Visit (HOSPITAL_COMMUNITY): Payer: Self-pay

## 2023-04-07 ENCOUNTER — Telehealth: Payer: Self-pay

## 2023-04-07 DIAGNOSIS — J4489 Other specified chronic obstructive pulmonary disease: Secondary | ICD-10-CM | POA: Insufficient documentation

## 2023-04-07 MED ORDER — WEGOVY 0.25 MG/0.5ML ~~LOC~~ SOAJ: 0.2500 mg | SUBCUTANEOUS | 0 refills | Status: DC

## 2023-04-07 NOTE — Assessment & Plan Note (Addendum)
-   Stable.  Not currently exacerbated.  She has mild dyspnea symptoms with exertion.  She is able to do all the ADLs.  CAT score 7.  Not currently on maintenance inhaler, Anoro was too expensive.  She was given a sample today and we will check with pharmacy what LABA/LAMA inhaler is formulary on her insurance plan. CT chest in September 2023 showed lungs were clear, no suspicious pulmonary nodules. Mild emphysema and aortic atherosclerosis. Follow-up 6 months with Waynetta Sandy NP

## 2023-04-07 NOTE — Telephone Encounter (Signed)
Call patient to inform about the approval. N/A LVM.   Prescription for Wegovy 0.25 mg sent to [facility]

## 2023-04-07 NOTE — Addendum Note (Signed)
Addended by: Tylene Fantasia on: 04/07/2023 04:05 PM   Modules accepted: Orders

## 2023-04-07 NOTE — Telephone Encounter (Signed)
Pharmacy Patient Advocate Encounter  Received notification from [facility] Medicaid that Prior Authorization for [facility] has been APPROVED from 04/07/23 to 08/18/98. Ran test claim, Copay is $298 (PATIENT HAS A COVERAGE GAP (DONUT HOLE)). This test claim was processed through [facility]- copay amounts may vary at other pharmacies due to pharmacy/plan contracts, or as the patient moves through the different stages of their insurance plan.

## 2023-04-07 NOTE — Telephone Encounter (Signed)
PA request for Brittany Jackson has been Submitted. New Encounter created for follow up. For additional info see Pharmacy Prior Auth telephone encounter from 04/07/23.

## 2023-04-07 NOTE — Telephone Encounter (Signed)
Pharmacy Patient Advocate Encounter   Received notification from Physician's Office that prior authorization for [facility] is required/requested.   Insurance verification completed.   The patient is insured through [facility] [place] .   Per test claim: PA required; PA submitted to [facility]  Medicaid via CoverMyMeds Key/confirmation #/EOC [id number] Status is pending

## 2023-04-16 ENCOUNTER — Other Ambulatory Visit (HOSPITAL_COMMUNITY): Payer: Self-pay

## 2023-04-16 MED ORDER — WEGOVY 0.25 MG/0.5ML ~~LOC~~ SOAJ
0.2500 mg | SUBCUTANEOUS | 0 refills | Status: DC
  Filled 2023-04-16: qty 2, 28d supply, fill #0

## 2023-04-16 NOTE — Telephone Encounter (Addendum)
Spoke to patient, patient's pharmacy did not have it in [place] so want [place] to send the prescription to [facility] pharmacy - [place]  location.  Prescription sent follow up is due in 4 weeks

## 2023-04-16 NOTE — Addendum Note (Signed)
Addended by: Tylene Fantasia on: 04/16/2023 03:56 PM   Modules accepted: Orders

## 2023-04-17 DIAGNOSIS — H43811 Vitreous degeneration, right eye: Secondary | ICD-10-CM | POA: Diagnosis not present

## 2023-04-17 DIAGNOSIS — H04123 Dry eye syndrome of bilateral lacrimal glands: Secondary | ICD-10-CM | POA: Diagnosis not present

## 2023-04-28 ENCOUNTER — Other Ambulatory Visit (HOSPITAL_COMMUNITY): Payer: Self-pay

## 2023-05-08 DIAGNOSIS — H04123 Dry eye syndrome of bilateral lacrimal glands: Secondary | ICD-10-CM | POA: Diagnosis not present

## 2023-05-08 DIAGNOSIS — H353131 Nonexudative age-related macular degeneration, bilateral, early dry stage: Secondary | ICD-10-CM | POA: Diagnosis not present

## 2023-05-08 DIAGNOSIS — E119 Type 2 diabetes mellitus without complications: Secondary | ICD-10-CM | POA: Diagnosis not present

## 2023-05-08 DIAGNOSIS — H25813 Combined forms of age-related cataract, bilateral: Secondary | ICD-10-CM | POA: Diagnosis not present

## 2023-05-09 NOTE — Progress Notes (Incomplete)
PCP: Elfredia Nevins, MD HF Cardiologist: Dr Shirlee Latch Nephrology: Dr. Thedore Mins   HPI: Ms Ziebell is a 82 y.o. female with history of chronic diastolic CHF, tobacco use, HTN, former smoker, and COPD.   Admitted 01/08/22 with inferolateral STEMI c/b acute systolic CHF with low-output. No intervention initially. She was seen by CT surgery, CABG not recommended.  Hospital course c/b AKI on CKD Stage IIIa, HAP, and leukocytosis. Briefly on CVVHD and had gradual improvement. Creatinine at d/c down to 2.0.  Developed rash felt to be in the setting of amiodarone. Treated with steroids and switched to amio without dye with improvement. Discharged on 01/22/22. She had paroxysmal atrial fibrillation during this admission.   Echo in 8/23 showed EF 65-70%, normal RV, mild MR, IVC normal. CT chest in 9/23 showed emphysema.   Follow up 10/23, NYHA I-II, BP stable so midodrine stopped. SGLT2i started and torsemide decreased to 20 mg every other day.  Called clinic 07/30/22 with complaints of dizziness.  Seen in AHF clinic 5/24 for acute visit with complaints of dizziness. ReDS stable. Suspected transient positional dizziness.   Today she returns for HF follow up. Overall feeling fine. Swelling has improved on every other day torsemide dosing. Rare palpitations. Denies CP, dizziness, edema, or PND/Orthopnea. Appetite ok. No fever or chills. Weight at home stable. Taking all medications. Has not lost weight yet on GLP1, she is frustrated by this.  ECG (personally reviewed): NSR 1st degree AVB, 63 bpm  REDs: 32%  Labs (7/23): K 4.6, creatinine 2.4, BNP 48 Labs (8/23): LDL 71, HDL 56, hgb 10.8, TSH normal, K 4.5, creatinine 2.05 Labs (10/23): LFTs normal, TSH normal Labs (11/23): K 4.2, creatinine 2.12 Labs (5/24): K 3.7, creatinine 2.1, TSH 1.2, LDL 63, A1c 5.7 Labs (8/24): K 4.0, creatinine 1.91  PMH: 1. Inflammatory dermatosis 2. HTN 3. CKD stage 3 4. COPD: Smoker until 5/23.  Obstructive PFTs in 5/23.  -  CT chest (9/23) with emphysema.  5. Atrial fibrillation: Paroxysmal.  6. Cardiomyopathy: Initially thought to be ischemic but suspect stress (Takotsubo-type) CMP given rapid improvement.  - Echo (5/23): EF 20-25%, mildly decreased RV systolic function, trivial MR, dilated IVC.  - Echo (5/23, repeat): improvement in EF to 65-70%  - Echo (8/23): EF 65-70%, normal RV, mild MR, IVC normal. 7. CAD: inferolateral STEMI by ECG 5/23 with HS-TnI in 3000 range, LHC with 3VD => 50-60% dLM, ?90% ostial LAD, 75% stenosis at bifurcation of mLAD and D, 75% stenosis at bifurcation of mLCx and OM.  The ostial LAD is not completely laid out and on review, I am not sure that it is truly critical.   ROS: All systems negative except as listed in HPI, PMH and Problem List.  SH:  Social History   Socioeconomic History   Marital status: Widowed    Spouse name: Not on file   Number of children: Not on file   Years of education: Not on file   Highest education level: Not on file  Occupational History   Not on file  Tobacco Use   Smoking status: Former    Current packs/day: 0.00    Average packs/day: 1 pack/day for 65.4 years (65.4 ttl pk-yrs)    Types: Cigarettes    Start date: 61    Quit date: 01/08/2022    Years since quitting: 1.3   Smokeless tobacco: Never  Vaping Use   Vaping status: Never Used  Substance and Sexual Activity   Alcohol use: Yes    Comment:  occasional   Drug use: No   Sexual activity: Never    Birth control/protection: Surgical  Other Topics Concern   Not on file  Social History Narrative   Not on file   Social Determinants of Health   Financial Resource Strain: Not on file  Food Insecurity: No Food Insecurity (01/22/2022)   Hunger Vital Sign    Worried About Running Out of Food in the Last Year: Never true    Ran Out of Food in the Last Year: Never true  Transportation Needs: No Transportation Needs (01/22/2022)   PRAPARE - Administrator, Civil Service  (Medical): No    Lack of Transportation (Non-Medical): No  Physical Activity: Not on file  Stress: Not on file  Social Connections: Not on file  Intimate Partner Violence: Not on file   FH:  Family History  Problem Relation Age of Onset   Hypertension Sister    Allergic rhinitis Neg Hx    Asthma Neg Hx    Angioedema Neg Hx    Atopy Neg Hx    Eczema Neg Hx    Immunodeficiency Neg Hx    Urticaria Neg Hx    Past Medical History:  Diagnosis Date   CHF (congestive heart failure) (HCC)    Hypertension    Urticaria    Current Outpatient Medications  Medication Sig Dispense Refill   acetaminophen (TYLENOL) 500 MG tablet Take 500-1,000 mg by mouth every 6 (six) hours as needed (pain.).     albuterol (VENTOLIN HFA) 108 (90 Base) MCG/ACT inhaler Inhale 2 puffs into the lungs every 6 (six) hours as needed for wheezing or shortness of breath. 8 g 6   amiodarone (PACERONE) 200 MG tablet Take 0.5 tablets (100 mg total) by mouth daily. 45 tablet 3   atorvastatin (LIPITOR) 80 MG tablet TAKE ONE TABLET BY MOUTH DAILY 90 tablet 3   colchicine 0.6 MG tablet Take 0.6 mg by mouth 2 (two) times daily as needed (onset of gout).     diazepam (VALIUM) 10 MG tablet Take 1 tablet (10 mg total) by mouth 3 (three) times daily as needed for anxiety. 5 tablet 0   empagliflozin (JARDIANCE) 10 MG TABS tablet Take 1 tablet (10 mg total) by mouth daily before breakfast. 30 tablet 11   hydrocortisone cream 1 % Apply topically as needed for itching. 28 g 0   Multiple Vitamins-Minerals (ADULT ONE DAILY GUMMIES PO) Take 2 tablets by mouth in the morning.     Semaglutide-Weight Management (WEGOVY) 0.25 MG/0.5ML SOAJ Inject 0.25 mg into the skin once a week. 2 mL 0   torsemide (DEMADEX) 20 MG tablet Take 1 tablet (20 mg total) by mouth every other day. 45 tablet 3   apixaban (ELIQUIS) 2.5 MG TABS tablet Take 1 tablet (2.5 mg total) by mouth 2 (two) times daily. (Patient not taking: Reported on 05/14/2023) 60 tablet 11    No current facility-administered medications for this encounter.   BP (!) 148/76   Pulse 75   Wt 115.8 kg (255 lb 3.2 oz)   SpO2 97%   BMI 42.47 kg/m   Wt Readings from Last 3 Encounters:  05/14/23 115.8 kg (255 lb 3.2 oz)  04/02/23 116 kg (255 lb 12.8 oz)  01/24/23 112.5 kg (248 lb)   PHYSICAL EXAM: General:  NAD. No resp difficulty, walked into clinic with cane HEENT: Normal Neck: Supple. No JVD. Carotids 2+ bilat; no bruits. No lymphadenopathy or thryomegaly appreciated. Cor: PMI nondisplaced. Regular rate &  rhythm. No rubs, gallops or murmurs. Lungs: Clear Abdomen: Soft, obese, nontender, nondistended. No hepatosplenomegaly. No bruits or masses. Good bowel sounds. Extremities: No cyanosis, clubbing, rash, edema Neuro: Alert & oriented x 3, cranial nerves grossly intact. Moves all 4 extremities w/o difficulty. Affect pleasant.  ASSESSMENT/PLAN: 1. CAD: Patient presented with ACS in 5/23, peak HS-TnI 3557. LHC with 3VD => 50-60% dLM, ?90% ostial LAD, 75% stenosis at bifurcation of mLAD and D, 75% stenosis at bifurcation of mLCx and OM.  The ostial LAD is not completely laid out and on review, not sure that it is truly critical.  CABG would be a consideration if LAD disease is severe, may require FFR to determine this. PCI distal left main into LAD also an option if FFR shows severe disease.  She has had no ischemic chest pain or dyspnea and EF had recovered on last echo in 8/23. Think we can hold off on further evaluation for now, especially as creatinine remains elevated (last creatinine was 2.05).  - Not on ASA given apixaban use.      - Continue high intensity statin, Lipids ok 5/24.  - Dismissed from Cardiac rehab due to not attending 2. Cardiomyopathy: Possible stress (Takotsubo-type) cardiomyopathy. Patient has CAD as above, but not sure it explains the extent of her initial cardiomyopathy in 5/23 and her rapid improvement.  Low output on initial RHC in 5/23 with CI 1.8.   Echo in 5/23 initially with EF 20-25%, mildly decreased RV systolic function, trivial MR, dilated IVC. Repeat echo later in 5/23 with improvement in EF to 65-70% => suspect Takotsubo CMP.  Echo in 8/23 showed EF 65-70%, normal RV, mild MR, IVC normal.  NYHA class II symptoms.  She is not volume overloaded. ReDs 32% - Continue torsemide 20 mg every other day. BMET and BNP today. - Continue Jardiance 10 mg daily. No GU symptoms. - Off midodrine with stable BP. - Update echo soon. 3.  Atrial fibrillation: Paroxysmal.  Regular on exam today. - Continue Eliquis.  No bleeding issues. - Continue amiodarone 100 mg daily. LFTs/TSH ok 5/24, will need regular eye exam.  4.  CKD Stage 3: AKI in 5/23 with transient CVVH.  Last creatinine 1.91 - She followed with Nephrology. - BMET today. 5. COPD: Patient has quit smoking.  - She has followup with pulmonary.  6. Obesity: Body mass index is 42.47 kg/m. - Continue GLP1RA, has not lost any weight after a month of use. No side effects. Will reach out to pharmacy team about increasing dose, but I told [place] the availability of the drug may be a barrier.  Follow up 4 months with Dr. Kathreen Cornfield [facility] FNP-BC 05/14/2023

## 2023-05-12 ENCOUNTER — Other Ambulatory Visit (HOSPITAL_COMMUNITY): Payer: Self-pay | Admitting: Cardiology

## 2023-05-12 NOTE — Telephone Encounter (Signed)
Patient called for refill request of wegovy. Reports current dose has not dropped weight at all. Would like to increase dose.   [facility]   Message to pharmacy team to assist/educate

## 2023-05-14 ENCOUNTER — Ambulatory Visit (HOSPITAL_COMMUNITY)
Admission: RE | Admit: 2023-05-14 | Discharge: 2023-05-14 | Disposition: A | Discharge status: Home / Self Care | Payer: Medicare Other | Source: Ambulatory Visit | Attending: Family Medicine

## 2023-05-14 ENCOUNTER — Other Ambulatory Visit (HOSPITAL_COMMUNITY): Payer: Self-pay

## 2023-05-14 ENCOUNTER — Encounter (HOSPITAL_COMMUNITY): Payer: Self-pay

## 2023-05-14 VITALS — BP 148/76 | HR 75 | Wt 255.2 lb

## 2023-05-14 DIAGNOSIS — I2510 Atherosclerotic heart disease of native coronary artery without angina pectoris: Secondary | ICD-10-CM | POA: Diagnosis not present

## 2023-05-14 DIAGNOSIS — N189 Chronic kidney disease, unspecified: Secondary | ICD-10-CM | POA: Diagnosis not present

## 2023-05-14 DIAGNOSIS — I429 Cardiomyopathy, unspecified: Secondary | ICD-10-CM | POA: Diagnosis not present

## 2023-05-14 DIAGNOSIS — I480 Paroxysmal atrial fibrillation: Secondary | ICD-10-CM | POA: Insufficient documentation

## 2023-05-14 DIAGNOSIS — E669 Obesity, unspecified: Secondary | ICD-10-CM | POA: Diagnosis not present

## 2023-05-14 DIAGNOSIS — I5032 Chronic diastolic (congestive) heart failure: Secondary | ICD-10-CM | POA: Diagnosis not present

## 2023-05-14 DIAGNOSIS — I130 Hypertensive heart and chronic kidney disease with heart failure and stage 1 through stage 4 chronic kidney disease, or unspecified chronic kidney disease: Secondary | ICD-10-CM | POA: Diagnosis not present

## 2023-05-14 DIAGNOSIS — J449 Chronic obstructive pulmonary disease, unspecified: Secondary | ICD-10-CM | POA: Insufficient documentation

## 2023-05-14 DIAGNOSIS — Z6841 Body Mass Index (BMI) 40.0 and over, adult: Secondary | ICD-10-CM | POA: Insufficient documentation

## 2023-05-14 DIAGNOSIS — I252 Old myocardial infarction: Secondary | ICD-10-CM | POA: Diagnosis not present

## 2023-05-14 DIAGNOSIS — Z87891 Personal history of nicotine dependence: Secondary | ICD-10-CM | POA: Diagnosis not present

## 2023-05-14 DIAGNOSIS — R9431 Abnormal electrocardiogram [ECG] [EKG]: Secondary | ICD-10-CM | POA: Diagnosis not present

## 2023-05-14 DIAGNOSIS — I110 Hypertensive heart disease with heart failure: Secondary | ICD-10-CM | POA: Diagnosis present

## 2023-05-14 DIAGNOSIS — N183 Chronic kidney disease, stage 3 unspecified: Secondary | ICD-10-CM

## 2023-05-14 LAB — BASIC METABOLIC PANEL
Anion gap: 8 (ref 5–15)
BUN: 36 mg/dL — ABNORMAL HIGH (ref 8–23)
CO2: 23 mmol/L (ref 22–32)
Calcium: 9.3 mg/dL (ref 8.9–10.3)
Chloride: 107 mmol/L (ref 98–111)
Creatinine, Ser: 1.9 mg/dL — ABNORMAL HIGH (ref 0.44–1.00)
GFR, Estimated: 26 mL/min — ABNORMAL LOW (ref >60)
Glucose, Bld: 90 mg/dL (ref 70–99)
Potassium: 4.2 mmol/L (ref 3.5–5.1)
Sodium: 138 mmol/L (ref 135–145)

## 2023-05-14 NOTE — Addendum Note (Signed)
Encounter addended by: Jacklynn Ganong, FNP on: 05/14/2023 4:25 PM  Actions taken: Clinical Note Signed

## 2023-05-14 NOTE — Progress Notes (Signed)
ReDS Vest / Clip - 05/14/23 1500       ReDS Vest / Clip   Station Marker D    Ruler Value 33    ReDS Value Range Low volume    ReDS Actual Value 32

## 2023-05-14 NOTE — Patient Instructions (Addendum)
Thank you for coming in today  If you had labs drawn today, any labs that are abnormal the clinic will call you No news is good news  Medications: No changes  Follow up appointments:  Your physician recommends that you schedule a follow-up appointment in: keep follow up appointment    Do the following things EVERYDAY: Weigh yourself in the morning before breakfast. Write it down and keep it in a log. Take your medicines as prescribed Eat low salt foods--Limit salt (sodium) to 2000 mg per day.  Stay as active as you can everyday Limit all fluids for the day to less than 2 liters   At the [facility], you and your health needs are our priority. As part of our continuing mission to provide you with exceptional heart care, we have created designated Provider Care Teams. These Care Teams include your primary Cardiologist (physician) and Advanced Practice Providers (APPs- Physician Assistants and Nurse Practitioners) who all work together to provide you with the care you need, when you need it.   You may see any of the following providers on your designated Care Team at your next follow up: Dr Arvilla Meres Dr Marca Ancona Dr. Marcos Eke, NP Robbie Lis, [place] [facility] [place], [place] Brynda Peon, NP Karle Plumber, PharmD   Please be sure to bring in all your medications bottles to every appointment.    Thank you for choosing [facility]  If you have any questions or concerns before your next appointment please send [place] a message through [place] or call our office at [phone].    TO LEAVE A MESSAGE FOR THE NURSE SELECT OPTION 2, PLEASE LEAVE A MESSAGE INCLUDING: YOUR NAME DATE OF BIRTH CALL BACK NUMBER REASON FOR CALL**this is important as we prioritize the call backs  YOU WILL RECEIVE A CALL BACK THE SAME DAY AS LONG AS YOU CALL BEFORE 4:00 PM

## 2023-05-15 ENCOUNTER — Telehealth: Payer: Self-pay | Admitting: Pharmacist

## 2023-05-15 ENCOUNTER — Other Ambulatory Visit (HOSPITAL_COMMUNITY): Payer: Self-pay

## 2023-05-15 ENCOUNTER — Other Ambulatory Visit (HOSPITAL_COMMUNITY): Payer: Self-pay | Admitting: Cardiology

## 2023-05-15 MED ORDER — WEGOVY 0.5 MG/0.5ML ~~LOC~~ SOAJ
0.5000 mg | SUBCUTANEOUS | 0 refills | Status: DC
  Filled 2023-05-15: qty 2, 28d supply, fill #0

## 2023-05-15 NOTE — Telephone Encounter (Signed)
Spoke to patient send Wegovy 0.5 mg once week prescription to [facility] pharmacy.   Follow up is due in 4 weeks.

## 2023-05-15 NOTE — Addendum Note (Signed)
Addended by: Tylene Fantasia on: 05/15/2023 01:09 PM   Modules accepted: Orders

## 2023-05-15 NOTE — Telephone Encounter (Signed)
Wegovy dose titration due - MyChart msg sent to pt.

## 2023-05-16 ENCOUNTER — Other Ambulatory Visit (HOSPITAL_COMMUNITY): Payer: Self-pay

## 2023-06-06 ENCOUNTER — Telehealth: Payer: Self-pay | Admitting: Pharmacist

## 2023-06-06 ENCOUNTER — Other Ambulatory Visit (HOSPITAL_COMMUNITY): Payer: Self-pay

## 2023-06-06 MED ORDER — WEGOVY 1 MG/0.5ML ~~LOC~~ SOAJ
1.0000 mg | SUBCUTANEOUS | 0 refills | Status: DC
  Filled 2023-06-06 – 2023-06-21 (×2): qty 2, 28d supply, fill #0

## 2023-06-06 NOTE — Telephone Encounter (Signed)
Spoke to patient, tolerates 0.5 mg Wegovy dose well, her diet has improved but admits she can start doing more physical activity. Will start going for walks regularly. Will up Wegovy dose to 1 mg once week.  Dose titration is due in 4 weeks

## 2023-06-06 NOTE — Telephone Encounter (Signed)
Patient is returning call. Transferred to [place], [facility].

## 2023-06-06 NOTE — Telephone Encounter (Signed)
Call to titrate Wegovy dose, N/A LVM.

## 2023-06-06 NOTE — Addendum Note (Signed)
Addended by: Tylene Fantasia on: 06/06/2023 11:14 AM   Modules accepted: Orders

## 2023-06-18 ENCOUNTER — Other Ambulatory Visit (HOSPITAL_COMMUNITY): Payer: Self-pay

## 2023-06-21 ENCOUNTER — Other Ambulatory Visit (HOSPITAL_COMMUNITY): Payer: Self-pay

## 2023-06-23 ENCOUNTER — Other Ambulatory Visit: Payer: Self-pay

## 2023-06-25 ENCOUNTER — Other Ambulatory Visit (HOSPITAL_COMMUNITY): Payer: Self-pay | Admitting: Cardiology

## 2023-06-25 ENCOUNTER — Telehealth (HOSPITAL_COMMUNITY): Payer: Self-pay | Admitting: Pharmacist

## 2023-06-25 NOTE — Telephone Encounter (Signed)
Patient left VM regarding Wegovy. Patient indicates she is extremely upset about the [facility] as it was "sold out from under me". Says she wants to speak with Dr. Shirlee Latch personally or Susa Day. Patient indicates she would like a call back ASAP on her home phone.  Will forward to [facility] to review as she has been helping the patient with the medication. Hopefully she can help th patient access the medication and resolve any other problems the patient may be having.

## 2023-06-25 NOTE — Telephone Encounter (Signed)
Called patient on home and Cell straight to VM, LVM to call back at [phone]

## 2023-06-26 NOTE — Telephone Encounter (Signed)
Spoke with patient. We discussed that the pharmacy put the wegovy back to stock bc she didn't pick it up. I understand she had to wait to get paid to pay for it. She doesn't have the money to get until the 20th. She took her last injection the week before. I advised that she should go that long and then resume at 1mg . The risk of side effect is high, but pt insists she will not go down on the dose  I will send in wegovy rx next week so it wont be returned to stock  She also needs her healthwell grant for jardiance renewed and is requesting Eliquis samples from the AHF clinic

## 2023-06-26 NOTE — Telephone Encounter (Signed)
Called patient on cell. Mail box was full. LVM on home phone.

## 2023-06-27 ENCOUNTER — Telehealth (HOSPITAL_COMMUNITY): Payer: Self-pay | Admitting: Pharmacy Technician

## 2023-06-27 NOTE — Telephone Encounter (Signed)
Advanced Heart Failure Patient Advocate Encounter  Medication Samples have been provided to the patient.  Drug name: Eliquis       Strength: 2.5 mg        Qty: 4 boxes  LOT: [id number]  Exp.Date: 08/2024  Dosing instructions: Take 1 tablet by mouth twice daily.   The patient has been instructed regarding the correct time, dose, and frequency of taking this medication, including desired effects and most common side effects.   Allen Kell [facility] 10:41 AM 06/27/2023

## 2023-06-27 NOTE — Telephone Encounter (Signed)
Advanced Heart Failure Patient Advocate Encounter  The patient was approved for a [employer] grant that will help cover the cost of Jardiance. Total amount awarded, $10,000. Eligibility, 06/04/23 - 06/02/24.  ID [id number]  BIN [id number]  PCN [id number]  Group [id number]  Called and spoke with the patient. Emailed copy of grant information.  Archer Asa, CPhT

## 2023-07-04 ENCOUNTER — Other Ambulatory Visit (HOSPITAL_COMMUNITY): Payer: Self-pay

## 2023-07-04 ENCOUNTER — Other Ambulatory Visit: Payer: Self-pay

## 2023-07-04 MED ORDER — WEGOVY 1 MG/0.5ML ~~LOC~~ SOAJ
1.0000 mg | SUBCUTANEOUS | 0 refills | Status: DC
  Filled 2023-07-04: qty 2, fill #0
  Filled 2023-07-11: qty 2, 28d supply, fill #0

## 2023-07-04 NOTE — Addendum Note (Signed)
Addended by: Malena Peer D on: 07/04/2023 09:34 AM   Modules accepted: Orders

## 2023-07-09 ENCOUNTER — Other Ambulatory Visit (HOSPITAL_COMMUNITY): Payer: Self-pay

## 2023-07-09 DIAGNOSIS — M1A00X0 Idiopathic chronic gout, unspecified site, without tophus (tophi): Secondary | ICD-10-CM | POA: Diagnosis not present

## 2023-07-09 DIAGNOSIS — I10 Essential (primary) hypertension: Secondary | ICD-10-CM | POA: Diagnosis not present

## 2023-07-09 DIAGNOSIS — F419 Anxiety disorder, unspecified: Secondary | ICD-10-CM | POA: Diagnosis not present

## 2023-07-09 DIAGNOSIS — N182 Chronic kidney disease, stage 2 (mild): Secondary | ICD-10-CM | POA: Diagnosis not present

## 2023-07-09 DIAGNOSIS — K5900 Constipation, unspecified: Secondary | ICD-10-CM | POA: Diagnosis not present

## 2023-07-09 DIAGNOSIS — I700 Atherosclerosis of aorta: Secondary | ICD-10-CM | POA: Diagnosis not present

## 2023-07-09 DIAGNOSIS — Z23 Encounter for immunization: Secondary | ICD-10-CM | POA: Diagnosis not present

## 2023-07-09 DIAGNOSIS — Z6841 Body Mass Index (BMI) 40.0 and over, adult: Secondary | ICD-10-CM | POA: Diagnosis not present

## 2023-07-11 ENCOUNTER — Other Ambulatory Visit (HOSPITAL_COMMUNITY): Payer: Self-pay

## 2023-07-14 ENCOUNTER — Other Ambulatory Visit (HOSPITAL_COMMUNITY): Payer: Self-pay

## 2023-07-18 ENCOUNTER — Other Ambulatory Visit (HOSPITAL_COMMUNITY): Payer: Self-pay

## 2023-07-21 ENCOUNTER — Other Ambulatory Visit (HOSPITAL_COMMUNITY): Payer: Self-pay

## 2023-07-21 ENCOUNTER — Telehealth: Payer: Self-pay | Admitting: Pharmacist Clinician (PhC)/ Clinical Pharmacy Specialist

## 2023-07-21 MED ORDER — WEGOVY 1.7 MG/0.75ML ~~LOC~~ SOAJ
1.7000 mg | SUBCUTANEOUS | 1 refills | Status: DC
  Filled 2023-07-21: qty 3, 28d supply, fill #0
  Filled 2023-08-14 – 2023-09-24 (×3): qty 3, 28d supply, fill #1

## 2023-07-21 NOTE — Telephone Encounter (Signed)
Patient reached out, requesting refill on Wegovy, to 1.7 mg dose.   She states not having side effects, currently on 1 mg dose, but still not losing any weight.  States she's gained weight.    Will send in prescription for 1.7 mg dose to [facility] pharmacy

## 2023-07-24 ENCOUNTER — Other Ambulatory Visit (HOSPITAL_COMMUNITY): Payer: Self-pay

## 2023-07-24 MED ORDER — APIXABAN 2.5 MG PO TABS
2.5000 mg | ORAL_TABLET | Freq: Two times a day (BID) | ORAL | 1 refills | Status: DC
  Filled 2023-07-24: qty 60, 30d supply, fill #0

## 2023-07-24 MED FILL — Empagliflozin Tab 10 MG: ORAL | 30 days supply | Qty: 30 | Fill #0 | Status: AC

## 2023-08-01 ENCOUNTER — Ambulatory Visit (HOSPITAL_COMMUNITY)
Admission: RE | Admit: 2023-08-01 | Discharge: 2023-08-01 | Disposition: A | Discharge status: Home / Self Care | Payer: Medicare Other | Source: Ambulatory Visit | Attending: Cardiology | Admitting: Cardiology

## 2023-08-01 ENCOUNTER — Encounter (HOSPITAL_COMMUNITY): Payer: Self-pay | Admitting: Cardiology

## 2023-08-01 VITALS — BP 120/78 | HR 77 | Wt 254.6 lb

## 2023-08-01 DIAGNOSIS — I2510 Atherosclerotic heart disease of native coronary artery without angina pectoris: Secondary | ICD-10-CM | POA: Insufficient documentation

## 2023-08-01 DIAGNOSIS — I252 Old myocardial infarction: Secondary | ICD-10-CM | POA: Diagnosis not present

## 2023-08-01 DIAGNOSIS — R0602 Shortness of breath: Secondary | ICD-10-CM | POA: Diagnosis not present

## 2023-08-01 DIAGNOSIS — I5032 Chronic diastolic (congestive) heart failure: Secondary | ICD-10-CM | POA: Diagnosis not present

## 2023-08-01 DIAGNOSIS — Z6841 Body Mass Index (BMI) 40.0 and over, adult: Secondary | ICD-10-CM | POA: Diagnosis not present

## 2023-08-01 DIAGNOSIS — R918 Other nonspecific abnormal finding of lung field: Secondary | ICD-10-CM | POA: Diagnosis not present

## 2023-08-01 DIAGNOSIS — I110 Hypertensive heart disease with heart failure: Secondary | ICD-10-CM | POA: Diagnosis present

## 2023-08-01 DIAGNOSIS — I130 Hypertensive heart and chronic kidney disease with heart failure and stage 1 through stage 4 chronic kidney disease, or unspecified chronic kidney disease: Secondary | ICD-10-CM | POA: Insufficient documentation

## 2023-08-01 DIAGNOSIS — R9431 Abnormal electrocardiogram [ECG] [EKG]: Secondary | ICD-10-CM | POA: Insufficient documentation

## 2023-08-01 DIAGNOSIS — N1831 Chronic kidney disease, stage 3a: Secondary | ICD-10-CM | POA: Insufficient documentation

## 2023-08-01 DIAGNOSIS — Z87891 Personal history of nicotine dependence: Secondary | ICD-10-CM | POA: Insufficient documentation

## 2023-08-01 DIAGNOSIS — J449 Chronic obstructive pulmonary disease, unspecified: Secondary | ICD-10-CM | POA: Insufficient documentation

## 2023-08-01 DIAGNOSIS — I480 Paroxysmal atrial fibrillation: Secondary | ICD-10-CM | POA: Insufficient documentation

## 2023-08-01 DIAGNOSIS — Z7901 Long term (current) use of anticoagulants: Secondary | ICD-10-CM | POA: Diagnosis not present

## 2023-08-01 DIAGNOSIS — E669 Obesity, unspecified: Secondary | ICD-10-CM | POA: Insufficient documentation

## 2023-08-01 DIAGNOSIS — I700 Atherosclerosis of aorta: Secondary | ICD-10-CM | POA: Diagnosis not present

## 2023-08-01 DIAGNOSIS — I429 Cardiomyopathy, unspecified: Secondary | ICD-10-CM | POA: Diagnosis not present

## 2023-08-01 LAB — COMPREHENSIVE METABOLIC PANEL
ALT: 22 U/L (ref 0–44)
AST: 28 U/L (ref 15–41)
Albumin: 3.9 g/dL (ref 3.5–5.0)
Alkaline Phosphatase: 81 U/L (ref 38–126)
Anion gap: 10 (ref 5–15)
BUN: 20 mg/dL (ref 8–23)
CO2: 26 mmol/L (ref 22–32)
Calcium: 9.2 mg/dL (ref 8.9–10.3)
Chloride: 104 mmol/L (ref 98–111)
Creatinine, Ser: 2.04 mg/dL — ABNORMAL HIGH (ref 0.44–1.00)
GFR, Estimated: 24 mL/min — ABNORMAL LOW (ref >60)
Glucose, Bld: 93 mg/dL (ref 70–99)
Potassium: 3.9 mmol/L (ref 3.5–5.1)
Sodium: 140 mmol/L (ref 135–145)
Total Bilirubin: 1 mg/dL (ref <1.2)
Total Protein: 7 g/dL (ref 6.5–8.1)

## 2023-08-01 LAB — TSH: TSH: 1.66 u[IU]/mL (ref 0.350–4.500)

## 2023-08-01 LAB — LIPID PANEL
Cholesterol: 146 mg/dL (ref 0–200)
HDL: 68 mg/dL (ref >40)
LDL Cholesterol: 59 mg/dL (ref 0–99)
Total CHOL/HDL Ratio: 2.1 {ratio}
Triglycerides: 96 mg/dL (ref <150)
VLDL: 19 mg/dL (ref 0–40)

## 2023-08-01 LAB — CBC
HCT: 37.2 % (ref 36.0–46.0)
Hemoglobin: 11.9 g/dL — ABNORMAL LOW (ref 12.0–15.0)
MCH: 28.8 pg (ref 26.0–34.0)
MCHC: 32 g/dL (ref 30.0–36.0)
MCV: 90.1 fL (ref 80.0–100.0)
Platelets: 267 10*3/uL (ref 150–400)
RBC: 4.13 MIL/uL (ref 3.87–5.11)
RDW: 13.8 % (ref 11.5–15.5)
WBC: 8.3 10*3/uL (ref 4.0–10.5)
nRBC: 0 % (ref 0.0–0.2)

## 2023-08-01 LAB — BRAIN NATRIURETIC PEPTIDE: B Natriuretic Peptide: 109.5 pg/mL — ABNORMAL HIGH (ref 0.0–100.0)

## 2023-08-01 NOTE — Patient Instructions (Addendum)
Great to see you today!!!  Labs done today, your results will be available in MyChart, we will contact you for abnormal readings.  X ray done today we will call if anything is abnormal  Pharmacy Clinic will call you regarding your [employer]  Your physician has requested that you have an echocardiogram. Echocardiography is a painless test that uses sound waves to create images of your heart. It provides your doctor with information about the size and shape of your heart and how well your heart's chambers and valves are working. This procedure takes approximately one hour. There are no restrictions for this procedure. Please do NOT wear cologne, perfume, aftershave, or lotions (deodorant is allowed). Please arrive 15 minutes prior to your appointment time.  Please note: We ask at that you not bring children with you during ultrasound (echo/ vascular) testing. Due to room size and safety concerns, children are not allowed in the ultrasound rooms during exams. Our front office staff cannot provide observation of children in our lobby area while testing is being conducted. An adult accompanying a patient to their appointment will only be allowed in the ultrasound room at the discretion of the ultrasound technician under special circumstances. We apologize for any inconvenience.  Your physician recommends that you schedule a follow-up appointment in: 4 months  If you have any questions or concerns before your next appointment please send [place] a message through [place] or call our office at [phone].    TO LEAVE A MESSAGE FOR THE NURSE SELECT OPTION 2, PLEASE LEAVE A MESSAGE INCLUDING: YOUR NAME DATE OF BIRTH CALL BACK NUMBER REASON FOR CALL**this is important as we prioritize the call backs  YOU WILL RECEIVE A CALL BACK THE SAME DAY AS LONG AS YOU CALL BEFORE 4:00 PM  At the [facility], you and your health needs are our priority. As part of our continuing mission to provide you  with exceptional heart care, we have created designated Provider Care Teams. These Care Teams include your primary Cardiologist (physician) and Advanced Practice Providers (APPs- Physician Assistants and Nurse Practitioners) who all work together to provide you with the care you need, when you need it.   You may see any of the following providers on your designated Care Team at your next follow up: Dr Arvilla Meres Dr Marca Ancona Dr. Dorthula Nettles Dr. Clearnce Hasten Amy Filbert Schilder, NP Robbie Lis, [place] [facility] [place], [place] Brynda Peon, NP [place] Lee, NP Karle Plumber, PharmD   Please be sure to bring in all your medications bottles to every appointment.    Thank you for choosing [facility]

## 2023-08-03 NOTE — Progress Notes (Signed)
PCP: Elfredia Nevins, MD HF Cardiologist: Dr Shirlee Latch Nephrology: Dr. Thedore Mins   HPI: [name] is a 82 y.o. female with history of chronic diastolic CHF, tobacco use, HTN, former smoker, and COPD.   Admitted 01/08/22 with inferolateral STEMI c/b acute systolic CHF with low-output. No intervention initially. She was seen by CT surgery, CABG not recommended.  Hospital course c/b AKI on CKD Stage IIIa, HAP, and leukocytosis. Briefly on CVVHD and had gradual improvement. Creatinine at d/c down to 2.0.  Developed rash felt to be in the setting of amiodarone. Treated with steroids and switched to amio without dye with improvement. Discharged on 01/22/22. She had paroxysmal atrial fibrillation during this admission.   Echo in 8/23 showed EF 65-70%, normal RV, mild MR, IVC normal. CT chest in 9/23 showed emphysema.   Today she returns for HF follow up.  She is on semaglutide, weight only down 1 lb.  She is not smoking. She is short of breath with stairs or carrying the laundary basket.  No dyspnea walking on flat ground.  No chest pain. No lightheadedness.    ECG (personally reviewed): NSR with 1st degree AVB  Labs (7/23): K 4.6, creatinine 2.4, BNP 48 Labs (8/23): LDL 71, HDL 56, hgb 10.8, TSH normal, K 4.5, creatinine 2.05 Labs (10/23): LFTs normal, TSH normal Labs (11/23): K 4.2, creatinine 2.12 Labs (5/24): K 3.7, creatinine 2.1, TSH 1.2, LDL 63, A1c 5.7 Labs (8/24): K 4.0, creatinine 1.91, BNP 160 Labs (9/24): K 4.2, creatinine 1.9  PMH: 1. Inflammatory dermatosis 2. HTN 3. CKD stage 3 4. COPD: Smoker until 5/23.  Obstructive PFTs in 5/23.  - CT chest (9/23) with emphysema.  5. Atrial fibrillation: Paroxysmal.  6. Cardiomyopathy: Initially thought to be ischemic but suspect stress (Takotsubo-type) CMP given rapid improvement.  - Echo (5/23): EF 20-25%, mildly decreased RV systolic function, trivial MR, dilated IVC.  - Echo (5/23, repeat): improvement in EF to 65-70%  - Echo (8/23): EF  65-70%, normal RV, mild MR, IVC normal. 7. CAD: inferolateral STEMI by ECG 5/23 with HS-TnI in 3000 range, LHC with 3VD => 50-60% dLM, ?90% ostial LAD, 75% stenosis at bifurcation of mLAD and D, 75% stenosis at bifurcation of mLCx and OM.  The ostial LAD is not completely laid out and on review, I am not sure that it is truly critical.   ROS: All systems negative except as listed in HPI, PMH and Problem List.  SH:  Social History   Socioeconomic History   Marital status: Widowed    Spouse name: Not on file   Number of children: Not on file   Years of education: Not on file   Highest education level: Not on file  Occupational History   Not on file  Tobacco Use   Smoking status: Former    Current packs/day: 0.00    Average packs/day: 1 pack/day for 65.4 years (65.4 ttl pk-yrs)    Types: Cigarettes    Start date: 24    Quit date: 01/08/2022    Years since quitting: 1.5   Smokeless tobacco: Never  Vaping Use   Vaping status: Never Used  Substance and Sexual Activity   Alcohol use: Yes    Comment: occasional   Drug use: No   Sexual activity: Never    Birth control/protection: Surgical  Other Topics Concern   Not on file  Social History Narrative   Not on file   Social Drivers of Health   Financial Resource Strain: Not on file  Food Insecurity: No Food Insecurity (01/22/2022)   Hunger Vital Sign    Worried About Running Out of Food in the Last Year: Never true    Ran Out of Food in the Last Year: Never true  Transportation Needs: No Transportation Needs (01/22/2022)   PRAPARE - Administrator, Civil Service (Medical): No    Lack of Transportation (Non-Medical): No  Physical Activity: Not on file  Stress: Not on file  Social Connections: Not on file  Intimate Partner Violence: Not on file   FH:  Family History  Problem Relation Age of Onset   Hypertension Sister    Allergic rhinitis Neg Hx    Asthma Neg Hx    Angioedema Neg Hx    Atopy Neg Hx    Eczema  Neg Hx    Immunodeficiency Neg Hx    Urticaria Neg Hx    Past Medical History:  Diagnosis Date   CHF (congestive heart failure) (HCC)    Hypertension    Urticaria    Current Outpatient Medications  Medication Sig Dispense Refill   acetaminophen (TYLENOL) 500 MG tablet Take 500-1,000 mg by mouth every 6 (six) hours as needed (pain.).     albuterol (VENTOLIN HFA) 108 (90 Base) MCG/ACT inhaler Inhale 2 puffs into the lungs every 6 (six) hours as needed for wheezing or shortness of breath. 8 g 6   amiodarone (PACERONE) 200 MG tablet Take 0.5 tablets (100 mg total) by mouth daily. 45 tablet 3   apixaban (ELIQUIS) 2.5 MG TABS tablet Take 1 tablet (2.5 mg total) by mouth 2 (two) times daily. 60 tablet 1   atorvastatin (LIPITOR) 80 MG tablet TAKE ONE TABLET BY MOUTH DAILY 90 tablet 3   colchicine 0.6 MG tablet Take 0.6 mg by mouth 2 (two) times daily as needed (onset of gout).     diazepam (VALIUM) 10 MG tablet Take 1 tablet (10 mg total) by mouth 3 (three) times daily as needed for anxiety. 5 tablet 0   empagliflozin (JARDIANCE) 10 MG TABS tablet Take 1 tablet (10 mg total) by mouth daily before breakfast. 30 tablet 11   Multiple Vitamins-Minerals (ADULT ONE DAILY GUMMIES PO) Take 2 tablets by mouth in the morning.     Semaglutide-Weight Management (WEGOVY) 1 MG/0.5ML SOAJ Inject 1 mg into the skin once a week. 2 mL 0   torsemide (DEMADEX) 20 MG tablet Take 1 tablet (20 mg total) by mouth every other day. 45 tablet 3   Semaglutide-Weight Management (WEGOVY) 1.7 MG/0.75ML SOAJ Inject 1.7 mg into the skin every 7 (seven) days. (Patient not taking: Reported on 08/01/2023) 3 mL 1   No current facility-administered medications for this encounter.   BP 120/78   Pulse 77   Wt 115.5 kg (254 lb 9.6 oz)   SpO2 95%   BMI 42.37 kg/m   Wt Readings from Last 3 Encounters:  08/01/23 115.5 kg (254 lb 9.6 oz)  05/14/23 115.8 kg (255 lb 3.2 oz)  04/02/23 116 kg (255 lb 12.8 oz)   PHYSICAL  EXAM: General: NAD Neck: No JVD, no thyromegaly or thyroid nodule.  Lungs: Clear to auscultation bilaterally with normal respiratory effort. CV: Nondisplaced PMI.  Heart regular S1/S2, no S3/S4, no murmur.  No peripheral edema.  No carotid bruit.  Normal pedal pulses.  Abdomen: Soft, nontender, no hepatosplenomegaly, no distention.  Skin: Intact without lesions or rashes.  Neurologic: Alert and oriented x 3.  Psych: Normal affect. Extremities: No clubbing or cyanosis.  HEENT: Normal.   ASSESSMENT/PLAN: 1. CAD: Patient presented with ACS in 5/23, peak HS-TnI 3557. LHC with 3VD => 50-60% dLM, ?90% ostial LAD, 75% stenosis at bifurcation of mLAD and D, 75% stenosis at bifurcation of mLCx and OM.  The ostial LAD is not completely laid out and on review, not sure that it is truly critical.  CABG would be a consideration if LAD disease is severe, may require FFR to determine this. PCI distal left main into LAD also an option if FFR shows severe disease.  She has had no ischemic chest pain or dyspnea and EF had recovered on last echo in 8/23. Think we can hold off on further evaluation for now, especially as creatinine remains elevated (last creatinine was 1.9).  - Not on ASA given apixaban use.      - Continue high intensity statin, Lipids ok 5/24.  2. Cardiomyopathy: Possible stress (Takotsubo-type) cardiomyopathy. Patient has CAD as above, but not sure it explains the extent of her initial cardiomyopathy in 5/23 and her rapid improvement.  Low output on initial RHC in 5/23 with CI 1.8.  Echo in 5/23 initially with EF 20-25%, mildly decreased RV systolic function, trivial MR, dilated IVC. Repeat echo later in 5/23 with improvement in EF to 65-70% => suspect Takotsubo CMP.  Echo in 8/23 showed EF 65-70%, normal RV, mild MR, IVC normal.  NYHA class II symptoms chronically.  She is not volume overloaded on exam.  - Continue torsemide 20 mg every other day. BMET/BNP today.  - Continue Jardiance 10 mg daily.   - Off midodrine with stable BP. - I will arrange for repeat echo to make sure EF remains normal. 3.  Atrial fibrillation: Paroxysmal.  NSR today.  - Continue Eliquis.  CBC today. - Continue amiodarone 100 mg daily. Check LFTs and TSH, will need regular eye exam.  4.  CKD Stage 3: AKI in 5/23 with transient CVVH.  Last creatinine 1.9. - She followed with Nephrology. - BMET today. 5. COPD: Patient has quit smoking.  - She has followup with pulmonary.  6. Obesity: Body mass index is 42.37 kg/m. - She will continue semaglutide, has not lost much weight yet.   Follow up 4 months with APP if echo looks ok.   Marca Ancona  08/03/2023

## 2023-08-04 ENCOUNTER — Encounter: Payer: Self-pay | Admitting: Pharmacist

## 2023-08-04 ENCOUNTER — Telehealth: Payer: Self-pay | Admitting: Pharmacist

## 2023-08-04 NOTE — Telephone Encounter (Signed)
Called patient. Patient states she ahs not lost any weight on Wegovy. Has last injection of 1 mg which she will use it on Monday 23 Dec. Then she will start using 1.7 mg once week dose from Monday Dec 30. States she met her deductible so wants [place] to send 2.4 mg Wegovy prescription to her pharmacy before end of this year. Will send Wegovy 2.4 mg prescription to her pharmacy on Dec 30/2025.

## 2023-08-04 NOTE — Telephone Encounter (Signed)
This encounter was created in error - please disregard.

## 2023-08-04 NOTE — Telephone Encounter (Signed)
-----   Message from Nurse Herbert Seta S sent at 08/01/2023  3:16 PM EST ----- Please call pt regarding Wegovy, she has questions about dose, thanks

## 2023-08-05 DIAGNOSIS — I872 Venous insufficiency (chronic) (peripheral): Secondary | ICD-10-CM | POA: Diagnosis not present

## 2023-08-05 DIAGNOSIS — Z0001 Encounter for general adult medical examination with abnormal findings: Secondary | ICD-10-CM | POA: Diagnosis not present

## 2023-08-05 DIAGNOSIS — Z6841 Body Mass Index (BMI) 40.0 and over, adult: Secondary | ICD-10-CM | POA: Diagnosis not present

## 2023-08-05 DIAGNOSIS — I5032 Chronic diastolic (congestive) heart failure: Secondary | ICD-10-CM | POA: Diagnosis not present

## 2023-08-05 DIAGNOSIS — Z1331 Encounter for screening for depression: Secondary | ICD-10-CM | POA: Diagnosis not present

## 2023-08-05 DIAGNOSIS — I10 Essential (primary) hypertension: Secondary | ICD-10-CM | POA: Diagnosis not present

## 2023-08-05 DIAGNOSIS — I252 Old myocardial infarction: Secondary | ICD-10-CM | POA: Diagnosis not present

## 2023-08-05 DIAGNOSIS — I700 Atherosclerosis of aorta: Secondary | ICD-10-CM | POA: Diagnosis not present

## 2023-08-14 ENCOUNTER — Other Ambulatory Visit (HOSPITAL_COMMUNITY): Payer: Self-pay

## 2023-08-14 MED ORDER — WEGOVY 2.4 MG/0.75ML ~~LOC~~ SOAJ
2.4000 mg | SUBCUTANEOUS | 5 refills | Status: DC
  Filled 2023-08-14 – 2023-09-15 (×2): qty 3, 28d supply, fill #0

## 2023-08-14 NOTE — Telephone Encounter (Signed)
Patient called to report she is missing 2.4mg  dose of wegovy Was under the impression med would be sent 12/16   Advised documentation states new RX to start 12/30 however would forward to pharmD to review

## 2023-08-14 NOTE — Telephone Encounter (Signed)
Prescription for 2.4 mg Wegovy 1 month with 5 refills sent to [facility] pharmacy, try to call patient no answer.

## 2023-08-14 NOTE — Addendum Note (Signed)
Addended by: Tylene Fantasia on: 08/14/2023 04:49 PM   Modules accepted: Orders

## 2023-08-18 ENCOUNTER — Other Ambulatory Visit (HOSPITAL_COMMUNITY): Payer: Self-pay

## 2023-08-18 MED FILL — Empagliflozin Tab 10 MG: ORAL | 30 days supply | Qty: 30 | Fill #1 | Status: AC

## 2023-08-23 ENCOUNTER — Other Ambulatory Visit (HOSPITAL_COMMUNITY): Payer: Self-pay

## 2023-09-02 ENCOUNTER — Other Ambulatory Visit (HOSPITAL_COMMUNITY): Payer: Self-pay

## 2023-09-02 ENCOUNTER — Other Ambulatory Visit (HOSPITAL_COMMUNITY): Payer: Self-pay | Admitting: Adult Health

## 2023-09-02 MED ORDER — APIXABAN 2.5 MG PO TABS
2.5000 mg | ORAL_TABLET | Freq: Two times a day (BID) | ORAL | 11 refills | Status: DC
  Filled 2023-09-15 – 2023-09-24 (×2): qty 60, 30d supply, fill #0
  Filled 2023-10-29: qty 14, 7d supply, fill #0
  Filled 2023-11-08: qty 18, 9d supply, fill #1
  Filled 2023-11-17: qty 30, 15d supply, fill #2
  Filled 2023-11-24: qty 60, 30d supply, fill #3
  Filled 2023-12-09: qty 30, 15d supply, fill #3
  Filled 2023-12-30: qty 30, 15d supply, fill #4
  Filled 2023-12-30 – 2024-03-23 (×2): qty 30, 15d supply, fill #5
  Filled 2024-03-26: qty 60, 30d supply, fill #5
  Filled 2024-04-20: qty 60, 30d supply, fill #6
  Filled 2024-05-13: qty 60, 30d supply, fill #7
  Filled 2024-06-04 (×2): qty 60, 30d supply, fill #8
  Filled 2024-07-16: qty 60, 30d supply, fill #9
  Filled 2024-08-06 – 2024-08-07 (×2): qty 60, 30d supply, fill #10
  Filled ????-??-?? (×2): fill #5

## 2023-09-04 ENCOUNTER — Ambulatory Visit (HOSPITAL_COMMUNITY)
Admission: RE | Admit: 2023-09-04 | Discharge: 2023-09-04 | Disposition: A | Discharge status: Home / Self Care | Payer: Medicare Other | Source: Ambulatory Visit | Attending: Cardiology | Admitting: Cardiology

## 2023-09-04 DIAGNOSIS — I110 Hypertensive heart disease with heart failure: Secondary | ICD-10-CM | POA: Diagnosis not present

## 2023-09-04 DIAGNOSIS — I5032 Chronic diastolic (congestive) heart failure: Secondary | ICD-10-CM | POA: Diagnosis not present

## 2023-09-04 DIAGNOSIS — I509 Heart failure, unspecified: Secondary | ICD-10-CM | POA: Insufficient documentation

## 2023-09-04 LAB — ECHOCARDIOGRAM COMPLETE
Area-P 1/2: 3.31 cm2
S' Lateral: 2.2 cm

## 2023-09-14 ENCOUNTER — Other Ambulatory Visit (HOSPITAL_COMMUNITY): Payer: Self-pay

## 2023-09-15 ENCOUNTER — Telehealth (HOSPITAL_COMMUNITY): Payer: Self-pay

## 2023-09-15 ENCOUNTER — Other Ambulatory Visit: Payer: Self-pay

## 2023-09-15 ENCOUNTER — Telehealth (HOSPITAL_COMMUNITY): Payer: Self-pay | Admitting: Pharmacy Technician

## 2023-09-15 ENCOUNTER — Other Ambulatory Visit (HOSPITAL_COMMUNITY): Payer: Self-pay

## 2023-09-15 MED FILL — Empagliflozin Tab 10 MG: ORAL | 30 days supply | Qty: 30 | Fill #2 | Status: CN

## 2023-09-15 NOTE — Telephone Encounter (Signed)
Advanced Heart Failure Patient Advocate Encounter  Medication Samples have been left at registration desk for patient pick up. Drug name: Eliquis 2.5 MG Qty: 4x 14 ct packages LOT: [id number] Exp.: 08/2024 SIG: Take 1 tablet by mouth twice daily   The patient has been instructed regarding the correct time, dose, and frequency of taking this medication, including desired effects and most common side effects.   Burnell Blanks, CPhT Rx Patient Advocate Phone: [phone]

## 2023-09-15 NOTE — Telephone Encounter (Signed)
Advanced Heart Failure Patient Advocate Encounter  Patient is requesting help with Eliquis. She would need to spend 3% OOP on medications in 2025 before we could apply for assistance. Getting 90 day refills of Jardiance with her current [employer] grant will help to meet the deductible over time. The Eliquis co-pay will drop after the deductible has been met.  She can also call her insurance company to apply for or inquire about a repayment plan. Left patient detailed message regarding options. One month of Eliquis samples will also be provided.  Archer Asa, CPhT

## 2023-09-16 ENCOUNTER — Other Ambulatory Visit (HOSPITAL_COMMUNITY): Payer: Self-pay

## 2023-09-16 NOTE — Telephone Encounter (Signed)
Advanced Heart Failure Patient Advocate Encounter  Patient called back. We had a long discussion about the repayment plan. I provided a phone number to her insurance company. She is aware we have Eliquis samples while she figures out if she would like to try the repayment plan.  Archer Asa, CPhT

## 2023-09-17 ENCOUNTER — Other Ambulatory Visit (HOSPITAL_COMMUNITY): Payer: Self-pay

## 2023-09-25 ENCOUNTER — Other Ambulatory Visit (HOSPITAL_COMMUNITY): Payer: Self-pay

## 2023-09-26 ENCOUNTER — Other Ambulatory Visit (HOSPITAL_COMMUNITY): Payer: Self-pay

## 2023-09-29 ENCOUNTER — Other Ambulatory Visit (HOSPITAL_COMMUNITY): Payer: Self-pay

## 2023-09-29 MED FILL — Empagliflozin Tab 10 MG: ORAL | 30 days supply | Qty: 30 | Fill #2 | Status: CN

## 2023-10-01 ENCOUNTER — Encounter (HOSPITAL_COMMUNITY): Payer: Self-pay

## 2023-10-01 ENCOUNTER — Other Ambulatory Visit (HOSPITAL_COMMUNITY): Payer: Self-pay | Admitting: Cardiology

## 2023-10-01 ENCOUNTER — Other Ambulatory Visit (HOSPITAL_COMMUNITY): Payer: Self-pay

## 2023-10-01 MED ORDER — EMPAGLIFLOZIN 10 MG PO TABS
10.0000 mg | ORAL_TABLET | Freq: Every day | ORAL | 3 refills | Status: DC
  Filled 2023-10-01: qty 90, 90d supply, fill #0
  Filled 2023-12-09: qty 90, 90d supply, fill #1
  Filled 2024-03-23: qty 90, 90d supply, fill #2
  Filled 2024-06-04 (×2): qty 90, 90d supply, fill #3

## 2023-10-02 ENCOUNTER — Other Ambulatory Visit (HOSPITAL_COMMUNITY): Payer: Self-pay

## 2023-10-03 ENCOUNTER — Other Ambulatory Visit (HOSPITAL_COMMUNITY): Payer: Self-pay

## 2023-10-04 ENCOUNTER — Other Ambulatory Visit (HOSPITAL_COMMUNITY): Payer: Self-pay

## 2023-10-09 ENCOUNTER — Telehealth: Payer: Self-pay | Admitting: Pharmacist

## 2023-10-09 DIAGNOSIS — I5032 Chronic diastolic (congestive) heart failure: Secondary | ICD-10-CM

## 2023-10-09 DIAGNOSIS — E0800 Diabetes mellitus due to underlying condition with hyperosmolarity without nonketotic hyperglycemic-hyperosmolar coma (NKHHC): Secondary | ICD-10-CM

## 2023-10-09 NOTE — Telephone Encounter (Signed)
-----   Message from Archer Asa sent at 09/17/2023 12:23 PM EST ----- Regarding: RE: Brittany Jackson Looks like she has the same insurance. She would like a return call to discuss options.  Thanks ----- Message ----- From: Tylene Fantasia, [facility] Sent: 09/17/2023   8:02 AM EST To: Archer Asa, CPhT Subject: RE: Brittany Jackson                                     Is there change in her insurance? The insurance she had last year, weight loss GLP1 were excluded.  Her A1c 5.7 - prediabetic range patient does not qualify for Ozempic or Mounjaro. ----- Message ----- From: Archer Asa, CPhT Sent: 09/16/2023   5:10 PM EST To: Tylene Fantasia, RPH Subject: NWGNFA                                         Hello,  I spoke with this patient today and she says she has gained weight on wegovy and would like to go mounjaro or something else that is not so expensive and works. I told her I would send a message. I also talked to her about the medicare repayment plan option. I think it would be helpful for her.  Thanks

## 2023-10-09 NOTE — Telephone Encounter (Signed)
Called pt. N/A can not leave msg, phone get disconnected before I could leave msg.

## 2023-10-29 ENCOUNTER — Other Ambulatory Visit (HOSPITAL_COMMUNITY): Payer: Self-pay

## 2023-11-05 DIAGNOSIS — H52213 Irregular astigmatism, bilateral: Secondary | ICD-10-CM | POA: Diagnosis not present

## 2023-11-05 DIAGNOSIS — H524 Presbyopia: Secondary | ICD-10-CM | POA: Diagnosis not present

## 2023-11-05 DIAGNOSIS — H25813 Combined forms of age-related cataract, bilateral: Secondary | ICD-10-CM | POA: Diagnosis not present

## 2023-11-05 DIAGNOSIS — H353131 Nonexudative age-related macular degeneration, bilateral, early dry stage: Secondary | ICD-10-CM | POA: Diagnosis not present

## 2023-11-08 ENCOUNTER — Other Ambulatory Visit (HOSPITAL_COMMUNITY): Payer: Self-pay

## 2023-11-17 ENCOUNTER — Other Ambulatory Visit (HOSPITAL_COMMUNITY): Payer: Self-pay

## 2023-11-18 DIAGNOSIS — H268 Other specified cataract: Secondary | ICD-10-CM | POA: Diagnosis not present

## 2023-11-18 DIAGNOSIS — H2511 Age-related nuclear cataract, right eye: Secondary | ICD-10-CM | POA: Diagnosis not present

## 2023-11-18 DIAGNOSIS — H25813 Combined forms of age-related cataract, bilateral: Secondary | ICD-10-CM | POA: Diagnosis not present

## 2023-11-24 ENCOUNTER — Other Ambulatory Visit (HOSPITAL_COMMUNITY): Payer: Self-pay

## 2023-12-01 ENCOUNTER — Telehealth (HOSPITAL_COMMUNITY): Payer: Self-pay

## 2023-12-01 NOTE — Telephone Encounter (Signed)
 Called to confirm/remind patient of their appointment at the [facility] on 12/02/23.   Appointment:   [] Confirmed  [x] Left mess   [] No answer/No voice mail  [] Phone not in service And to bring in all medications and/or complete list.

## 2023-12-02 ENCOUNTER — Ambulatory Visit (HOSPITAL_COMMUNITY)
Admission: RE | Admit: 2023-12-02 | Discharge: 2023-12-02 | Disposition: A | Discharge status: Home / Self Care | Payer: Medicare Other | Source: Ambulatory Visit | Attending: Family Medicine | Admitting: Family Medicine

## 2023-12-02 ENCOUNTER — Encounter (HOSPITAL_COMMUNITY): Payer: Self-pay

## 2023-12-02 VITALS — BP 140/70 | HR 70 | Wt 266.6 lb

## 2023-12-02 DIAGNOSIS — Z6841 Body Mass Index (BMI) 40.0 and over, adult: Secondary | ICD-10-CM | POA: Diagnosis not present

## 2023-12-02 DIAGNOSIS — J439 Emphysema, unspecified: Secondary | ICD-10-CM | POA: Insufficient documentation

## 2023-12-02 DIAGNOSIS — Z955 Presence of coronary angioplasty implant and graft: Secondary | ICD-10-CM | POA: Diagnosis not present

## 2023-12-02 DIAGNOSIS — I5032 Chronic diastolic (congestive) heart failure: Secondary | ICD-10-CM | POA: Diagnosis not present

## 2023-12-02 DIAGNOSIS — I480 Paroxysmal atrial fibrillation: Secondary | ICD-10-CM | POA: Diagnosis not present

## 2023-12-02 DIAGNOSIS — Z7901 Long term (current) use of anticoagulants: Secondary | ICD-10-CM | POA: Diagnosis not present

## 2023-12-02 DIAGNOSIS — Z79899 Other long term (current) drug therapy: Secondary | ICD-10-CM | POA: Insufficient documentation

## 2023-12-02 DIAGNOSIS — I2510 Atherosclerotic heart disease of native coronary artery without angina pectoris: Secondary | ICD-10-CM | POA: Insufficient documentation

## 2023-12-02 DIAGNOSIS — Z87891 Personal history of nicotine dependence: Secondary | ICD-10-CM | POA: Diagnosis not present

## 2023-12-02 DIAGNOSIS — Z7984 Long term (current) use of oral hypoglycemic drugs: Secondary | ICD-10-CM | POA: Insufficient documentation

## 2023-12-02 DIAGNOSIS — I429 Cardiomyopathy, unspecified: Secondary | ICD-10-CM | POA: Insufficient documentation

## 2023-12-02 DIAGNOSIS — E669 Obesity, unspecified: Secondary | ICD-10-CM | POA: Diagnosis not present

## 2023-12-02 DIAGNOSIS — M545 Low back pain, unspecified: Secondary | ICD-10-CM | POA: Insufficient documentation

## 2023-12-02 DIAGNOSIS — G8929 Other chronic pain: Secondary | ICD-10-CM | POA: Insufficient documentation

## 2023-12-02 DIAGNOSIS — N1831 Chronic kidney disease, stage 3a: Secondary | ICD-10-CM | POA: Diagnosis not present

## 2023-12-02 DIAGNOSIS — I130 Hypertensive heart and chronic kidney disease with heart failure and stage 1 through stage 4 chronic kidney disease, or unspecified chronic kidney disease: Secondary | ICD-10-CM | POA: Diagnosis not present

## 2023-12-02 LAB — COMPREHENSIVE METABOLIC PANEL WITH GFR
ALT: 19 U/L (ref 0–44)
AST: 24 U/L (ref 15–41)
Albumin: 3.6 g/dL (ref 3.5–5.0)
Alkaline Phosphatase: 91 U/L (ref 38–126)
Anion gap: 10 (ref 5–15)
BUN: 36 mg/dL — ABNORMAL HIGH (ref 8–23)
CO2: 24 mmol/L (ref 22–32)
Calcium: 9.1 mg/dL (ref 8.9–10.3)
Chloride: 105 mmol/L (ref 98–111)
Creatinine, Ser: 1.97 mg/dL — ABNORMAL HIGH (ref 0.44–1.00)
GFR, Estimated: 25 mL/min — ABNORMAL LOW (ref >60)
Glucose, Bld: 86 mg/dL (ref 70–99)
Potassium: 4.4 mmol/L (ref 3.5–5.1)
Sodium: 139 mmol/L (ref 135–145)
Total Bilirubin: 0.6 mg/dL (ref 0.0–1.2)
Total Protein: 6.7 g/dL (ref 6.5–8.1)

## 2023-12-02 LAB — CBC
HCT: 35.3 % — ABNORMAL LOW (ref 36.0–46.0)
Hemoglobin: 11.2 g/dL — ABNORMAL LOW (ref 12.0–15.0)
MCH: 28.9 pg (ref 26.0–34.0)
MCHC: 31.7 g/dL (ref 30.0–36.0)
MCV: 91.2 fL (ref 80.0–100.0)
Platelets: 293 10*3/uL (ref 150–400)
RBC: 3.87 MIL/uL (ref 3.87–5.11)
RDW: 13.9 % (ref 11.5–15.5)
WBC: 6.7 10*3/uL (ref 4.0–10.5)
nRBC: 0 % (ref 0.0–0.2)

## 2023-12-02 LAB — BRAIN NATRIURETIC PEPTIDE: B Natriuretic Peptide: 123.1 pg/mL — ABNORMAL HIGH (ref 0.0–100.0)

## 2023-12-02 LAB — TSH: TSH: 2.247 u[IU]/mL (ref 0.350–4.500)

## 2023-12-02 LAB — T4, FREE: Free T4: 0.92 ng/dL (ref 0.61–1.12)

## 2023-12-02 NOTE — Patient Instructions (Signed)
 No change in medications today. Labs today - will call you if abnormal. Referral sent to PharmD for evaluation for different GLP1 for weight loss. Return to see Dr. Mitzie Anda in 4 - 6 months. CALL US  AT [phone] IN SEPTEMBER TO SCHEDULE THIS APPOINTMENT. Please call us  at [phone] if any questions or concerns prior to your next appointment.

## 2023-12-02 NOTE — Progress Notes (Signed)
 ReDS Vest / Clip - 12/02/23 1400       ReDS Vest / Clip   Station Marker D    Ruler Value 37.5    ReDS Value Range Low volume    ReDS Actual Value 27

## 2023-12-02 NOTE — Progress Notes (Signed)
 PCP: Elfredia Nevins, MD HF Cardiologist: Dr Shirlee Latch Nephrology: Dr. Thedore Mins   HPI: [name] is a 83 y.o. female with history of chronic diastolic CHF, tobacco use, HTN, former smoker, and COPD.   Admitted 01/08/22 with inferolateral STEMI c/b acute systolic CHF with low-output. No intervention initially. She was seen by CT surgery, CABG not recommended.  Hospital course c/b AKI on CKD Stage IIIa, HAP, and leukocytosis. Briefly on CVVHD and had gradual improvement. Creatinine at d/c down to 2.0.  She had paroxysmal atrial fibrillation during this admission. Developed rash felt to be in the setting of amiodarone. Treated with steroids and switched to amio without dye with improvement. Discharged on 01/22/22.   Echo in 8/23 showed EF 65-70%, normal RV, mild MR, IVC normal. CT chest in 9/23 showed emphysema.   Echo 1/25 EF 65-70%, RV normal   She presents today for routine f/u. She denies CP. No dyspnea.   She continues on semaglutide [facility]) but says it is not working. Not suppressing her appetite. She is gaining wt. Wt is up 10 lb since last visit. She is not SOB. ReDs is 27% (normal). She is asking if she can switch to [facility].   EKG today shows NSR 67 bpm.    ECG (personally reviewed): NSR 67 bpm.   ReDs 27%, normal    Labs (7/23): K 4.6, creatinine 2.4, BNP 48 Labs (8/23): LDL 71, HDL 56, hgb 10.8, TSH normal, K 4.5, creatinine 2.05 Labs (10/23): LFTs normal, TSH normal Labs (11/23): K 4.2, creatinine 2.12 Labs (5/24): K 3.7, creatinine 2.1, TSH 1.2, LDL 63, A1c 5.7 Labs (8/24): K 4.0, creatinine 1.91, BNP 160 Labs (9/24): K 4.2, creatinine 1.9 Labs (12/24), K 3.9, creatinine 2.04, BNP 109, LDL 59, TSH normal (1.6), Hgb 11.9   PMH: 1. Inflammatory dermatosis 2. HTN 3. CKD stage 3 4. COPD: Smoker until 5/23.  Obstructive PFTs in 5/23.  - CT chest (9/23) with emphysema.  5. Atrial fibrillation: Paroxysmal.  6. Cardiomyopathy: Initially thought to be ischemic but suspect stress  (Takotsubo-type) CMP given rapid improvement.  - Echo (5/23): EF 20-25%, mildly decreased RV systolic function, trivial MR, dilated IVC.  - Echo (5/23, repeat): improvement in EF to 65-70%  - Echo (8/23): EF 65-70%, normal RV, mild MR, IVC normal. 7. CAD: inferolateral STEMI by ECG 5/23 with HS-TnI in 3000 range, LHC with 3VD => 50-60% dLM, ?90% ostial LAD, 75% stenosis at bifurcation of mLAD and D, 75% stenosis at bifurcation of mLCx and OM.  The ostial LAD is not completely laid out and on review, I am not sure that it is truly critical.   ROS: All systems negative except as listed in HPI, PMH and Problem List.  SH:  Social History   Socioeconomic History   Marital status: Widowed    Spouse name: Not on file   Number of children: Not on file   Years of education: Not on file   Highest education level: Not on file  Occupational History   Not on file  Tobacco Use   Smoking status: Former    Current packs/day: 0.00    Average packs/day: 1 pack/day for 65.4 years (65.4 ttl pk-yrs)    Types: Cigarettes    Start date: 69    Quit date: 01/08/2022    Years since quitting: 1.8   Smokeless tobacco: Never  Vaping Use   Vaping status: Never Used  Substance and Sexual Activity   Alcohol use: Yes    Comment: occasional  Drug use: No   Sexual activity: Never    Birth control/protection: Surgical  Other Topics Concern   Not on file  Social History Narrative   Not on file   Social Drivers of Health   Financial Resource Strain: Not on file  Food Insecurity: No Food Insecurity (01/22/2022)   Hunger Vital Sign    Worried About Running Out of Food in the Last Year: Never true    Ran Out of Food in the Last Year: Never true  Transportation Needs: No Transportation Needs (01/22/2022)   PRAPARE - Administrator, Civil Service (Medical): No    Lack of Transportation (Non-Medical): No  Physical Activity: Not on file  Stress: Not on file  Social Connections: Not on file   Intimate Partner Violence: Not on file   FH:  Family History  Problem Relation Age of Onset   Hypertension Sister    Allergic rhinitis Neg Hx    Asthma Neg Hx    Angioedema Neg Hx    Atopy Neg Hx    Eczema Neg Hx    Immunodeficiency Neg Hx    Urticaria Neg Hx    Past Medical History:  Diagnosis Date   CHF (congestive heart failure) (HCC)    Hypertension    Urticaria    Current Outpatient Medications  Medication Sig Dispense Refill   acetaminophen (TYLENOL) 500 MG tablet Take 500-1,000 mg by mouth every 6 (six) hours as needed (pain.).     albuterol (VENTOLIN HFA) 108 (90 Base) MCG/ACT inhaler Inhale 2 puffs into the lungs every 6 (six) hours as needed for wheezing or shortness of breath. 8 g 6   amiodarone (PACERONE) 200 MG tablet Take 0.5 tablets (100 mg total) by mouth daily. 45 tablet 3   apixaban (ELIQUIS) 2.5 MG TABS tablet Take 1 tablet (2.5 mg total) by mouth 2 (two) times daily. 60 tablet 11   atorvastatin (LIPITOR) 80 MG tablet TAKE ONE TABLET BY MOUTH DAILY 90 tablet 3   colchicine 0.6 MG tablet Take 0.6 mg by mouth 2 (two) times daily as needed (onset of gout).     diazepam (VALIUM) 10 MG tablet Take 1 tablet (10 mg total) by mouth 3 (three) times daily as needed for anxiety. 5 tablet 0   empagliflozin (JARDIANCE) 10 MG TABS tablet Take 1 tablet (10 mg total) by mouth daily before breakfast. 90 tablet 3   Multiple Vitamins-Minerals (ADULT ONE DAILY GUMMIES PO) Take 2 tablets by mouth in the morning.     torsemide (DEMADEX) 20 MG tablet Take 1 tablet (20 mg total) by mouth every other day. (Patient taking differently: Take 10 mg by mouth every other day.) 45 tablet 3   No current facility-administered medications for this encounter.   BP (!) 140/70   Pulse 70   Wt 120.9 kg (266 lb 9.6 oz)   SpO2 96%   BMI 44.36 kg/m   Wt Readings from Last 3 Encounters:  12/02/23 120.9 kg (266 lb 9.6 oz)  08/01/23 115.5 kg (254 lb 9.6 oz)  05/14/23 115.8 kg (255 lb 3.2 oz)    PHYSICAL EXAM: ReDs 27%  General:  Well appearing, obese. No respiratory difficulty HEENT: normal Neck: supple. no JVD. Carotids 2+ bilat; no bruits. No lymphadenopathy or thyromegaly appreciated. Cor: PMI nondisplaced. Regular rate & rhythm. No rubs, gallops or murmurs. Lungs: clear Abdomen: soft, nontender, nondistended. No hepatosplenomegaly. No bruits or masses. Good bowel sounds. Extremities: no cyanosis, clubbing, rash, edema Neuro: alert &  oriented x 3, cranial nerves grossly intact. moves all 4 extremities w/o difficulty. Affect pleasant.   ASSESSMENT/PLAN:  1. CAD: Patient presented with ACS in 5/23, peak HS-TnI 3557. LHC with 3VD => 50-60% dLM, ?90% ostial LAD, 75% stenosis at bifurcation of mLAD and D, 75% stenosis at bifurcation of mLCx and OM.  The ostial LAD is not completely laid out and on review, not sure that it is truly critical.  CABG would be a consideration if LAD disease is severe, may require FFR to determine this. PCI distal left main into LAD also an option if FFR shows severe disease. She remains CP free. No anginal symptoms and EF has recovered.  - plan to hold off on further evaluation for now, given recovered EF and elevated SCr (2.0 last check) - continue medical therapy  - no ASA given need for Eliquis - continue high intensity statin, atorvastatin 80 mg. Check CMP and LP today  2. Cardiomyopathy: Possible stress (Takotsubo-type) cardiomyopathy. Patient has CAD as above, but not sure it explains the extent of her initial cardiomyopathy in 5/23 and her rapid improvement.  Low output on initial RHC in 5/23 with CI 1.8.  Echo in 5/23 initially with EF 20-25%, mildly decreased RV systolic function, trivial MR, dilated IVC. Repeat echo later in 5/23 with improvement in EF to 65-70% => suspect Takotsubo CMP.  Echo in 8/23 showed EF 65-70%, normal RV, mild MR, IVC normal. Last Echo 1/25 EF 65-70%, RV normal. Chronic NYHA Class II symptoms, confounded by  obesity/deconditioning and chronic LBP.  Euvolemic on exam. ReDs 27%. I think her wt gain is caloric   - Continue torsemide every other day. Check BMP/BNP today  - Continue Jardiance 10 mg daily.  - on other GDMT given CKD, h/o soft BP requiring midodrine (now discontinued) and recovered EF   3.  Atrial fibrillation: Paroxysmal.  EKG today shows NSR  - Continue amiodarone 100 mg daily. Check LFTs and TSH, will need regular eye exam. - Continue Eliquis 2.5 mg bid (reduced dose due to age and SCr). Check CBC today    4.  CKD Stage 3: AKI in 5/23 with transient CVVH.  Last Scr was 2.0  - She followed with Nephrology. - on SGLT2i, Jardiance  - obtain BMP today   5. COPD: Patient has quit smoking.  - She has followup with pulmonary.   6. Obesity: Body mass index is 44.36 kg/m. - On semaglutide but gaining wt - she is asking if she can switch to another GLP1 - will refer back to pharmacy clinic   4. HTN: Initial BP 154/80. 140/70 on repeat check  - she says her SBPs are usually in the 130s - I advised that she monitor BP at home, ok for SBPs in the 130s. No new agents today. Need to avoid hypotension given CKD.   F/u w/ Dr. Mitzie Anda in 6 months   Ruddy Corral, PA-C  12/02/2023

## 2023-12-03 ENCOUNTER — Telehealth (HOSPITAL_COMMUNITY): Payer: Self-pay

## 2023-12-03 DIAGNOSIS — I5032 Chronic diastolic (congestive) heart failure: Secondary | ICD-10-CM

## 2023-12-03 LAB — T3, FREE: T3, Free: 2 pg/mL (ref 2.0–4.4)

## 2023-12-03 MED ORDER — TORSEMIDE 20 MG PO TABS
20.0000 mg | ORAL_TABLET | Freq: Every day | ORAL | Status: DC
Start: 2023-12-03 — End: 2024-02-06

## 2023-12-03 NOTE — Telephone Encounter (Signed)
-----   Message from Nice sent at 12/03/2023  3:16 PM EDT ----- Increase torsemide to 20 mg daily ----- Message ----- From: Destyn Parfitt B, RN Sent: 12/03/2023   3:11 PM EDT To: Brittainy Tedd Favorite, PA-C  Returned call to patient and she states that she has actually been taking a full tablet (20mg ) every other day. Reports she has not been cutting in half. Patient has been taking a full tablet (20mg ) of torsemide every other day.   Will forward to provider for recommendations.

## 2023-12-03 NOTE — Telephone Encounter (Signed)
 Per Brittainy,   Increase torsemide to 20 mg daily  Patient aware of the above.  Repeat labs ordered and scheduled. Patient aware of appointment time and date. Advised patient to call back to office with any issues, questions, or concerns. Patient verbalized understanding.

## 2023-12-05 ENCOUNTER — Other Ambulatory Visit (HOSPITAL_COMMUNITY): Payer: Self-pay

## 2023-12-08 DIAGNOSIS — H25812 Combined forms of age-related cataract, left eye: Secondary | ICD-10-CM | POA: Diagnosis not present

## 2023-12-08 DIAGNOSIS — Z961 Presence of intraocular lens: Secondary | ICD-10-CM | POA: Diagnosis not present

## 2023-12-08 NOTE — Telephone Encounter (Signed)
 Pt is calling you back about.

## 2023-12-09 ENCOUNTER — Other Ambulatory Visit (HOSPITAL_COMMUNITY): Payer: Self-pay

## 2023-12-09 NOTE — Addendum Note (Signed)
 Addended by: Kuulei Kleier K on: 12/09/2023 02:12 PM   Modules accepted: Orders

## 2023-12-09 NOTE — Telephone Encounter (Signed)
 That sounds reasonable.  We will order sleep study.

## 2023-12-09 NOTE — Telephone Encounter (Signed)
 Spoke to patient, reports she had tried Wegovy  in the past and it did not help her to loose weight. She is gaining so much weight. She checked with her insurance Mounjaro will be covered under her plan. Her last A1c was in pre-diabetes range -11 months ago. We will get updated A1c. Also suggest for sleep study. If AHI >15 most insurances covers Zepbound for OSA treatment in overweight/obese patient.

## 2023-12-10 ENCOUNTER — Ambulatory Visit (HOSPITAL_COMMUNITY)
Admission: RE | Admit: 2023-12-10 | Discharge: 2023-12-10 | Disposition: A | Discharge status: Home / Self Care | Source: Ambulatory Visit | Attending: Cardiology | Admitting: Cardiology

## 2023-12-10 DIAGNOSIS — I5032 Chronic diastolic (congestive) heart failure: Secondary | ICD-10-CM | POA: Insufficient documentation

## 2023-12-10 LAB — BASIC METABOLIC PANEL WITH GFR
Anion gap: 14 (ref 5–15)
BUN: 37 mg/dL — ABNORMAL HIGH (ref 8–23)
CO2: 23 mmol/L (ref 22–32)
Calcium: 9 mg/dL (ref 8.9–10.3)
Chloride: 100 mmol/L (ref 98–111)
Creatinine, Ser: 2.13 mg/dL — ABNORMAL HIGH (ref 0.44–1.00)
GFR, Estimated: 22 mL/min — ABNORMAL LOW (ref >60)
Glucose, Bld: 102 mg/dL — ABNORMAL HIGH (ref 70–99)
Potassium: 4.2 mmol/L (ref 3.5–5.1)
Sodium: 137 mmol/L (ref 135–145)

## 2023-12-10 LAB — BRAIN NATRIURETIC PEPTIDE: B Natriuretic Peptide: 161.3 pg/mL — ABNORMAL HIGH (ref 0.0–100.0)

## 2023-12-13 ENCOUNTER — Other Ambulatory Visit (HOSPITAL_COMMUNITY): Payer: Self-pay

## 2023-12-15 NOTE — Telephone Encounter (Signed)
 In lab sleep study placed

## 2023-12-15 NOTE — Addendum Note (Signed)
 Addended by: Edris Gowers on: 12/15/2023 03:03 PM   Modules accepted: Orders

## 2023-12-23 DIAGNOSIS — H2512 Age-related nuclear cataract, left eye: Secondary | ICD-10-CM | POA: Diagnosis not present

## 2023-12-23 DIAGNOSIS — H268 Other specified cataract: Secondary | ICD-10-CM | POA: Diagnosis not present

## 2023-12-23 DIAGNOSIS — H25812 Combined forms of age-related cataract, left eye: Secondary | ICD-10-CM | POA: Diagnosis not present

## 2023-12-30 ENCOUNTER — Other Ambulatory Visit (HOSPITAL_COMMUNITY): Payer: Self-pay

## 2024-02-05 ENCOUNTER — Other Ambulatory Visit (HOSPITAL_COMMUNITY): Payer: Self-pay | Admitting: Adult Health

## 2024-02-05 ENCOUNTER — Ambulatory Visit: Admitting: Pharmacist Clinician (PhC)/ Clinical Pharmacy Specialist

## 2024-02-05 ENCOUNTER — Other Ambulatory Visit (HOSPITAL_COMMUNITY): Payer: Self-pay | Admitting: Cardiology

## 2024-02-05 NOTE — Progress Notes (Unsigned)
 This encounter was created in error - please disregard.

## 2024-02-24 ENCOUNTER — Encounter (HOSPITAL_BASED_OUTPATIENT_CLINIC_OR_DEPARTMENT_OTHER): Admitting: Cardiology

## 2024-02-25 ENCOUNTER — Other Ambulatory Visit (HOSPITAL_COMMUNITY): Payer: Self-pay

## 2024-02-25 DIAGNOSIS — N184 Chronic kidney disease, stage 4 (severe): Secondary | ICD-10-CM | POA: Diagnosis not present

## 2024-02-25 DIAGNOSIS — L03119 Cellulitis of unspecified part of limb: Secondary | ICD-10-CM | POA: Diagnosis not present

## 2024-02-25 DIAGNOSIS — I10 Essential (primary) hypertension: Secondary | ICD-10-CM | POA: Diagnosis not present

## 2024-02-25 DIAGNOSIS — Z6841 Body Mass Index (BMI) 40.0 and over, adult: Secondary | ICD-10-CM | POA: Diagnosis not present

## 2024-02-25 DIAGNOSIS — I5032 Chronic diastolic (congestive) heart failure: Secondary | ICD-10-CM | POA: Diagnosis not present

## 2024-02-25 DIAGNOSIS — F419 Anxiety disorder, unspecified: Secondary | ICD-10-CM | POA: Diagnosis not present

## 2024-02-25 MED ORDER — OZEMPIC (0.25 OR 0.5 MG/DOSE) 2 MG/3ML ~~LOC~~ SOPN
0.5000 mg | PEN_INJECTOR | SUBCUTANEOUS | 4 refills | Status: DC
  Filled 2024-02-25: qty 3, 28d supply, fill #0

## 2024-02-25 MED ORDER — OZEMPIC (0.25 OR 0.5 MG/DOSE) 2 MG/3ML ~~LOC~~ SOPN
0.5000 mg | PEN_INJECTOR | SUBCUTANEOUS | 4 refills | Status: DC
  Filled 2024-02-25 – 2024-02-27 (×2): qty 3, 28d supply, fill #0
  Filled 2024-03-26: qty 3, 28d supply, fill #1

## 2024-02-26 ENCOUNTER — Other Ambulatory Visit (HOSPITAL_COMMUNITY): Payer: Self-pay

## 2024-02-27 ENCOUNTER — Other Ambulatory Visit (HOSPITAL_COMMUNITY): Payer: Self-pay

## 2024-02-27 ENCOUNTER — Other Ambulatory Visit (HOSPITAL_BASED_OUTPATIENT_CLINIC_OR_DEPARTMENT_OTHER): Payer: Self-pay

## 2024-03-23 ENCOUNTER — Other Ambulatory Visit (HOSPITAL_COMMUNITY): Payer: Self-pay

## 2024-03-26 ENCOUNTER — Other Ambulatory Visit (HOSPITAL_COMMUNITY): Payer: Self-pay

## 2024-03-26 ENCOUNTER — Other Ambulatory Visit (HOSPITAL_BASED_OUTPATIENT_CLINIC_OR_DEPARTMENT_OTHER): Payer: Self-pay

## 2024-03-26 MED ORDER — OZEMPIC (1 MG/DOSE) 4 MG/3ML ~~LOC~~ SOPN
1.0000 mg | PEN_INJECTOR | SUBCUTANEOUS | 5 refills | Status: DC
  Filled 2024-03-26 (×2): qty 3, 28d supply, fill #0
  Filled 2024-04-06 – 2024-04-15 (×2): qty 3, 28d supply, fill #1

## 2024-03-29 ENCOUNTER — Other Ambulatory Visit (HOSPITAL_COMMUNITY): Payer: Self-pay

## 2024-04-03 ENCOUNTER — Other Ambulatory Visit (HOSPITAL_COMMUNITY): Payer: Self-pay

## 2024-04-06 ENCOUNTER — Other Ambulatory Visit (HOSPITAL_COMMUNITY): Payer: Self-pay

## 2024-04-06 ENCOUNTER — Ambulatory Visit (HOSPITAL_COMMUNITY)
Admission: RE | Admit: 2024-04-06 | Discharge: 2024-04-06 | Disposition: A | Discharge status: Home / Self Care | Source: Ambulatory Visit | Attending: Cardiology | Admitting: Cardiology

## 2024-04-06 ENCOUNTER — Telehealth (HOSPITAL_COMMUNITY): Payer: Self-pay

## 2024-04-06 ENCOUNTER — Ambulatory Visit (HOSPITAL_COMMUNITY): Payer: Self-pay | Admitting: Cardiology

## 2024-04-06 ENCOUNTER — Encounter (HOSPITAL_COMMUNITY): Payer: Self-pay | Admitting: Cardiology

## 2024-04-06 VITALS — BP 110/70 | HR 74 | Wt 256.6 lb

## 2024-04-06 DIAGNOSIS — I429 Cardiomyopathy, unspecified: Secondary | ICD-10-CM | POA: Diagnosis not present

## 2024-04-06 DIAGNOSIS — E669 Obesity, unspecified: Secondary | ICD-10-CM | POA: Diagnosis not present

## 2024-04-06 DIAGNOSIS — J449 Chronic obstructive pulmonary disease, unspecified: Secondary | ICD-10-CM | POA: Insufficient documentation

## 2024-04-06 DIAGNOSIS — E66813 Obesity, class 3: Secondary | ICD-10-CM | POA: Diagnosis not present

## 2024-04-06 DIAGNOSIS — D631 Anemia in chronic kidney disease: Secondary | ICD-10-CM | POA: Insufficient documentation

## 2024-04-06 DIAGNOSIS — I252 Old myocardial infarction: Secondary | ICD-10-CM | POA: Diagnosis not present

## 2024-04-06 DIAGNOSIS — Z79899 Other long term (current) drug therapy: Secondary | ICD-10-CM | POA: Insufficient documentation

## 2024-04-06 DIAGNOSIS — N183 Chronic kidney disease, stage 3 unspecified: Secondary | ICD-10-CM | POA: Diagnosis not present

## 2024-04-06 DIAGNOSIS — I480 Paroxysmal atrial fibrillation: Secondary | ICD-10-CM | POA: Diagnosis not present

## 2024-04-06 DIAGNOSIS — Z7984 Long term (current) use of oral hypoglycemic drugs: Secondary | ICD-10-CM | POA: Insufficient documentation

## 2024-04-06 DIAGNOSIS — Z87891 Personal history of nicotine dependence: Secondary | ICD-10-CM | POA: Insufficient documentation

## 2024-04-06 DIAGNOSIS — I2510 Atherosclerotic heart disease of native coronary artery without angina pectoris: Secondary | ICD-10-CM | POA: Diagnosis not present

## 2024-04-06 DIAGNOSIS — Z6841 Body Mass Index (BMI) 40.0 and over, adult: Secondary | ICD-10-CM | POA: Diagnosis not present

## 2024-04-06 DIAGNOSIS — I130 Hypertensive heart and chronic kidney disease with heart failure and stage 1 through stage 4 chronic kidney disease, or unspecified chronic kidney disease: Secondary | ICD-10-CM | POA: Insufficient documentation

## 2024-04-06 DIAGNOSIS — I110 Hypertensive heart disease with heart failure: Secondary | ICD-10-CM | POA: Diagnosis present

## 2024-04-06 DIAGNOSIS — I5032 Chronic diastolic (congestive) heart failure: Secondary | ICD-10-CM | POA: Diagnosis not present

## 2024-04-06 LAB — LIPID PANEL
Cholesterol: 118 mg/dL (ref 0–200)
HDL: 48 mg/dL (ref >40)
LDL Cholesterol: 44 mg/dL (ref 0–99)
Total CHOL/HDL Ratio: 2.5 ratio
Triglycerides: 131 mg/dL (ref <150)
VLDL: 26 mg/dL (ref 0–40)

## 2024-04-06 LAB — IRON AND TIBC
Iron: 73 ug/dL (ref 28–170)
Saturation Ratios: 23 % (ref 10.4–31.8)
TIBC: 315 ug/dL (ref 250–450)
UIBC: 242 ug/dL

## 2024-04-06 LAB — CBC
HCT: 36.1 % (ref 36.0–46.0)
Hemoglobin: 11.8 g/dL — ABNORMAL LOW (ref 12.0–15.0)
MCH: 29.3 pg (ref 26.0–34.0)
MCHC: 32.7 g/dL (ref 30.0–36.0)
MCV: 89.6 fL (ref 80.0–100.0)
Platelets: 320 K/uL (ref 150–400)
RBC: 4.03 MIL/uL (ref 3.87–5.11)
RDW: 13.9 % (ref 11.5–15.5)
WBC: 8.3 K/uL (ref 4.0–10.5)
nRBC: 0 % (ref 0.0–0.2)

## 2024-04-06 LAB — COMPREHENSIVE METABOLIC PANEL WITH GFR
ALT: 19 U/L (ref 0–44)
AST: 32 U/L (ref 15–41)
Albumin: 3.7 g/dL (ref 3.5–5.0)
Alkaline Phosphatase: 87 U/L (ref 38–126)
Anion gap: 9 (ref 5–15)
BUN: 33 mg/dL — ABNORMAL HIGH (ref 8–23)
CO2: 26 mmol/L (ref 22–32)
Calcium: 9.7 mg/dL (ref 8.9–10.3)
Chloride: 100 mmol/L (ref 98–111)
Creatinine, Ser: 2.1 mg/dL — ABNORMAL HIGH (ref 0.44–1.00)
GFR, Estimated: 23 mL/min — ABNORMAL LOW (ref >60)
Glucose, Bld: 95 mg/dL (ref 70–99)
Potassium: 4.7 mmol/L (ref 3.5–5.1)
Sodium: 135 mmol/L (ref 135–145)
Total Bilirubin: 0.8 mg/dL (ref 0.0–1.2)
Total Protein: 7 g/dL (ref 6.5–8.1)

## 2024-04-06 LAB — BRAIN NATRIURETIC PEPTIDE: B Natriuretic Peptide: 58.8 pg/mL (ref 0.0–100.0)

## 2024-04-06 LAB — FERRITIN: Ferritin: 25 ng/mL (ref 11–307)

## 2024-04-06 LAB — TSH: TSH: 2.336 u[IU]/mL (ref 0.350–4.500)

## 2024-04-06 MED ORDER — FINERENONE 10 MG PO TABS
10.0000 mg | ORAL_TABLET | Freq: Every day | ORAL | 6 refills | Status: AC
  Filled 2024-04-06: qty 30, 30d supply, fill #0
  Filled 2024-05-05: qty 30, 30d supply, fill #1
  Filled 2024-06-04 (×2): qty 30, 30d supply, fill #2
  Filled 2024-06-21 – 2024-06-26 (×3): qty 30, 30d supply, fill #3
  Filled 2024-08-06: qty 30, 30d supply, fill #4

## 2024-04-06 MED ORDER — FINERENONE 10 MG PO TABS: 10.0000 mg | ORAL_TABLET | Freq: Every day | ORAL | 6 refills | Status: DC

## 2024-04-06 NOTE — Progress Notes (Signed)
 PCP: Bertell Satterfield, MD HF Cardiologist: Dr Rolan Nephrology: Dr. Dennise   Chief complaint: CHF  HPI: Brittany Jackson is a 83 y.o. female with history of chronic diastolic CHF, tobacco use, HTN, former smoker, and COPD.   Admitted 01/08/22 with inferolateral STEMI c/b acute systolic CHF with low-output. No intervention initially. She was seen by CT surgery, CABG not recommended.  Hospital course c/b AKI on CKD Stage III, HAP, and leukocytosis. Briefly on CVVHD and had gradual improvement. Creatinine at d/c down to 2.0.  She had paroxysmal atrial fibrillation during this admission. Developed rash felt to be in the setting of amiodarone . Treated with steroids and switched to amio without dye with improvement. Discharged on 01/22/22.   Echo in 8/23 showed EF 65-70%, normal RV, mild MR, IVC normal. CT chest in 9/23 showed emphysema.   Echo 1/25 with EF 65-70%, RV normal   She returns for followup of CHF.  She reports low energy level, gets tired walking up stairs.  She is short of breath carrying the laundary basket.  She does ok walking on flat ground. No chest pain.  No BRBPR/melena.  No orthopnea/PND.  No palpitations, lightheadedness. Weight is down 10 lbs.   ECG (personally reviewed): NSR, 1st degree AVB, old anterior MI  Labs (5/24): K 3.7, creatinine 2.1, TSH 1.2, LDL 63, A1c 5.7 Labs (8/24): K 4.0, creatinine 1.91, BNP 160 Labs (9/24): K 4.2, creatinine 1.9 Labs (12/24), K 3.9, creatinine 2.04, BNP 109, LDL 59, TSH normal (1.6), Hgb 11.9  Labs (4/25): K 4.2, creatinine 2.13, LFTs normal, TSH normal, BNP 161  PMH: 1. Inflammatory dermatosis 2. HTN 3. CKD stage 3 4. COPD: Smoker until 5/23.  Obstructive PFTs in 5/23.  - CT chest (9/23) with emphysema.  5. Atrial fibrillation: Paroxysmal.  6. Cardiomyopathy: Initially thought to be ischemic but suspect stress (Takotsubo-type) CMP given rapid improvement.  - Echo (5/23): EF 20-25%, mildly decreased RV systolic function, trivial MR, dilated  IVC.  - Echo (5/23, repeat): improvement in EF to 65-70%  - Echo (8/23): EF 65-70%, normal RV, mild MR, IVC normal. - Echo (1/25): EF 65-70%, normal RV 7. CAD: inferolateral STEMI by ECG 5/23 with HS-TnI in 3000 range, LHC with 3VD => 50-60% dLM, ?90% ostial LAD, 75% stenosis at bifurcation of mLAD and D, 75% stenosis at bifurcation of mLCx and OM.  The ostial LAD is not completely laid out and on review, I am not sure that it is truly critical.   ROS: All systems negative except as listed in HPI, PMH and Problem List.  SH:  Social History   Socioeconomic History   Marital status: Widowed    Spouse name: Not on file   Number of children: Not on file   Years of education: Not on file   Highest education level: Not on file  Occupational History   Not on file  Tobacco Use   Smoking status: Former    Current packs/day: 0.00    Average packs/day: 1 pack/day for 65.4 years (65.4 ttl pk-yrs)    Types: Cigarettes    Start date: 85    Quit date: 01/08/2022    Years since quitting: 2.2   Smokeless tobacco: Never  Vaping Use   Vaping status: Never Used  Substance and Sexual Activity   Alcohol use: Yes    Comment: occasional   Drug use: No   Sexual activity: Never    Birth control/protection: Surgical  Other Topics Concern   Not on file  Social  History Narrative   Not on file   Social Drivers of Health   Financial Resource Strain: Not on file  Food Insecurity: No Food Insecurity (01/22/2022)   Hunger Vital Sign    Worried About Running Out of Food in the Last Year: Never true    Ran Out of Food in the Last Year: Never true  Transportation Needs: No Transportation Needs (01/22/2022)   PRAPARE - Administrator, Civil Service (Medical): No    Lack of Transportation (Non-Medical): No  Physical Activity: Not on file  Stress: Not on file  Social Connections: Not on file  Intimate Partner Violence: Not on file   FH:  Family History  Problem Relation Age of Onset    Hypertension Sister    Allergic rhinitis Neg Hx    Asthma Neg Hx    Angioedema Neg Hx    Atopy Neg Hx    Eczema Neg Hx    Immunodeficiency Neg Hx    Urticaria Neg Hx    Past Medical History:  Diagnosis Date   CHF (congestive heart failure) (HCC)    Hypertension    Urticaria    Current Outpatient Medications  Medication Sig Dispense Refill   acetaminophen  (TYLENOL ) 500 MG tablet Take 500-1,000 mg by mouth every 6 (six) hours as needed (pain.).     albuterol  (VENTOLIN  HFA) 108 (90 Base) MCG/ACT inhaler Inhale 2 puffs into the lungs every 6 (six) hours as needed for wheezing or shortness of breath. 8 g 6   amiodarone  (PACERONE ) 200 MG tablet Take 0.5 tablets (100 mg total) by mouth daily. 45 tablet 3   apixaban  (ELIQUIS ) 2.5 MG TABS tablet Take 1 tablet (2.5 mg total) by mouth 2 (two) times daily. 60 tablet 11   atorvastatin  (LIPITOR ) 80 MG tablet TAKE ONE TABLET BY MOUTH DAILY 90 tablet 3   colchicine 0.6 MG tablet Take 0.6 mg by mouth 2 (two) times daily as needed (onset of gout).     diazepam  (VALIUM ) 10 MG tablet Take 1 tablet (10 mg total) by mouth 3 (three) times daily as needed for anxiety. 5 tablet 0   empagliflozin  (JARDIANCE ) 10 MG TABS tablet Take 1 tablet (10 mg total) by mouth daily before breakfast. 90 tablet 3   Semaglutide ,0.25 or 0.5MG /DOS, (OZEMPIC , 0.25 OR 0.5 MG/DOSE,) 2 MG/3ML SOPN Inject 0.5 mg into the skin once a week. 3 mL 4   torsemide  (DEMADEX ) 20 MG tablet Take 1 tablet (20 mg total) by mouth every other day. 45 tablet 3   Finerenone  10 MG TABS Take 1 tablet (10 mg total) by mouth daily. 30 tablet 6   Multiple Vitamins-Minerals (ADULT ONE DAILY GUMMIES PO) Take 2 tablets by mouth in the morning. (Patient not taking: Reported on 04/06/2024)     Semaglutide , 1 MG/DOSE, (OZEMPIC , 1 MG/DOSE,) 4 MG/3ML SOPN Inject 1 mg into the skin once a week. (Patient not taking: Reported on 04/06/2024) 3 mL 5   No current facility-administered medications for this encounter.    BP 110/70   Pulse 74   Wt 116.4 kg (256 lb 9.6 oz)   SpO2 95%   BMI 42.70 kg/m   Wt Readings from Last 3 Encounters:  04/06/24 116.4 kg (256 lb 9.6 oz)  12/02/23 120.9 kg (266 lb 9.6 oz)  08/01/23 115.5 kg (254 lb 9.6 oz)   PHYSICAL EXAM: General: NAD Neck: No JVD, no thyromegaly or thyroid  nodule.  Lungs: Clear to auscultation bilaterally with normal respiratory effort. CV: Nondisplaced  PMI.  Heart regular S1/S2, no S3/S4, no murmur.  No peripheral edema.  No carotid bruit.  Normal pedal pulses.  Abdomen: Soft, nontender, no hepatosplenomegaly, no distention.  Skin: Intact without lesions or rashes.  Neurologic: Alert and oriented x 3.  Psych: Normal affect. Extremities: No clubbing or cyanosis.  HEENT: Normal.   ASSESSMENT/PLAN:  1. CAD: Patient presented with ACS in 5/23, peak HS-TnI 3557. LHC with 3VD => 50-60% dLM, ?90% ostial LAD, 75% stenosis at bifurcation of mLAD and D, 75% stenosis at bifurcation of mLCx and OM.  The ostial LAD is not completely laid out and on review, not sure that it is truly critical.  CABG would be a consideration if LAD disease is severe, would require FFR to determine this. PCI distal left main into LAD also an option if FFR shows severe disease. However, she remains CP free. No anginal symptoms and EF has recovered.  - Plan to hold off on further evaluation for now, given recovered EF and elevated SCr (2.13 last check) - No ASA given need for Eliquis  - Continue high intensity statin, atorvastatin  80 mg. Lipids today.  2. Cardiomyopathy: Possible stress (Takotsubo-type) cardiomyopathy. Patient has CAD as above, but not sure it explains the extent of her initial cardiomyopathy in 5/23 and her rapid improvement.  Low output on initial RHC in 5/23 with CI 1.8.  Echo in 5/23 initially with EF 20-25%, mildly decreased RV systolic function, trivial MR, dilated IVC. Repeat echo later in 5/23 with improvement in EF to 65-70% => suspect Takotsubo CMP.  Echo  in 8/23 showed EF 65-70%, normal RV, mild MR, IVC normal. Most recent echo in 1/25 with EF 65-70%, RV normal. NYHA class II, confounded by obesity/deconditioning and chronic LBP.  Weight is down and she is not volume overloaded on exam.  - Continue torsemide  20 mg every other day. Check BMP/BNP today  - Continue Jardiance  10 mg daily.  - Add finerenone  10 mg daily.  BMET in 10 days.  3.  Atrial fibrillation: Paroxysmal.  EKG today shows NSR  - Continue amiodarone  100 mg daily. Check LFTs and TSH, will need regular eye exam. - Continue Eliquis  2.5 mg bid (reduced dose due to age and SCr). Check CBC today   4.  CKD Stage 3: AKI in 5/23 with transient CVVH.  Last Scr was 2.13.   - She followed with Nephrology. - on SGLT2i, Jardiance   - Adding finerenone  as above.  - obtain BMP today  5. COPD: Patient has quit smoking.  - She has followup with pulmonary.  6. Obesity: Body mass index is 42.7 kg/m. - On semaglutide  with slow weight gain.  Her PCP is retiring, need to refer to our pharmacy clinic to manage her semaglutide .  7. Anemia: Check Fe studies, iron infusion if low.    Followup 4 months with APP  I spent 31 minutes reviewing records, interviewing/examining patient, and managing orders.   Ezra Shuck,  04/06/2024

## 2024-04-06 NOTE — Patient Instructions (Signed)
 Medication Changes:  START Finerenone  (Kerendia ) 10 mg Daily  Lab Work:  Labs done today, your results will be available in MyChart, we will contact you for abnormal readings.  Your physician recommends that you return for lab work in: 1-2 weeks  Referrals:  You have been referred to Pharmacy Clinic for management of your Ozempic   Special Instructions // Education:  Do the following things EVERYDAY: Weigh yourself in the morning before breakfast. Write it down and keep it in a log. Take your medicines as prescribed Eat low salt foods--Limit salt (sodium) to 2000 mg per day.  Stay as active as you can everyday Limit all fluids for the day to less than 2 liters   Follow-Up in: 4 months   At the [facility], you and your health needs are our priority. We have a designated team specialized in the treatment of Heart Failure. This Care Team includes your primary Heart Failure Specialized Cardiologist (physician), Advanced Practice Providers (APPs- Physician Assistants and Nurse Practitioners), and Pharmacist who all work together to provide you with the care you need, when you need it.   You may see any of the following providers on your designated Care Team at your next follow up:  Dr. Toribio Fuel Dr. Ezra Shuck Dr. Ria Commander Dr. Odis Brownie Greig Mosses, NP Caffie Shed, [place] [facility], [place] Beckey Coe, NP [place] Lee, NP Tinnie Redman, PharmD   Please be sure to bring in all your medications bottles to every appointment.   Need to Contact Us :  If you have any questions or concerns before your next appointment please send us  a message through [place] or call our office at [phone].    TO LEAVE A MESSAGE FOR THE NURSE SELECT OPTION 2, PLEASE LEAVE A MESSAGE INCLUDING: YOUR NAME DATE OF BIRTH CALL BACK NUMBER REASON FOR CALL**this is important as we prioritize the call backs  YOU WILL RECEIVE A CALL BACK THE SAME  DAY AS LONG AS YOU CALL BEFORE 4:00 PM

## 2024-04-06 NOTE — Telephone Encounter (Signed)
 [facility] Patient Advocate Encounter  Test billing for this patients current coverage ([employer] Medicare D) returns a $0 copay for 90 day supply of Kerendia .  Rachel DEL, CPhT Rx Patient Advocate Phone: [phone]

## 2024-04-07 ENCOUNTER — Other Ambulatory Visit (HOSPITAL_COMMUNITY): Payer: Self-pay

## 2024-04-10 ENCOUNTER — Other Ambulatory Visit (HOSPITAL_COMMUNITY): Payer: Self-pay | Admitting: Cardiology

## 2024-04-15 ENCOUNTER — Other Ambulatory Visit (HOSPITAL_COMMUNITY): Payer: Self-pay

## 2024-04-16 ENCOUNTER — Other Ambulatory Visit (HOSPITAL_COMMUNITY): Payer: Self-pay

## 2024-04-16 ENCOUNTER — Other Ambulatory Visit (HOSPITAL_COMMUNITY)

## 2024-04-16 MED ORDER — OZEMPIC (0.25 OR 0.5 MG/DOSE) 2 MG/3ML ~~LOC~~ SOPN
0.5000 mg | PEN_INJECTOR | SUBCUTANEOUS | 4 refills | Status: DC
  Filled 2024-04-16: qty 3, 28d supply, fill #0

## 2024-04-20 ENCOUNTER — Other Ambulatory Visit (HOSPITAL_COMMUNITY): Payer: Self-pay

## 2024-04-20 ENCOUNTER — Encounter (HOSPITAL_COMMUNITY): Payer: Self-pay

## 2024-04-20 MED ORDER — OZEMPIC (1 MG/DOSE) 4 MG/3ML ~~LOC~~ SOPN
1.0000 mg | PEN_INJECTOR | SUBCUTANEOUS | 5 refills | Status: DC
  Filled 2024-04-20 – 2024-04-21 (×2): qty 3, 28d supply, fill #0

## 2024-04-20 MED ORDER — OZEMPIC (0.25 OR 0.5 MG/DOSE) 2 MG/3ML ~~LOC~~ SOPN
0.5000 mg | PEN_INJECTOR | SUBCUTANEOUS | 4 refills | Status: DC
  Filled 2024-04-20: qty 3, 28d supply, fill #0

## 2024-04-21 ENCOUNTER — Other Ambulatory Visit (HOSPITAL_COMMUNITY): Payer: Self-pay

## 2024-04-21 MED ORDER — OZEMPIC (2 MG/DOSE) 8 MG/3ML ~~LOC~~ SOPN
2.0000 mg | PEN_INJECTOR | SUBCUTANEOUS | 1 refills | Status: AC
  Filled 2024-04-21: qty 3, 28d supply, fill #0
  Filled 2024-04-21: qty 9, 84d supply, fill #0
  Filled 2024-05-13: qty 3, 28d supply, fill #1
  Filled 2024-06-14: qty 3, 28d supply, fill #2
  Filled 2024-07-16: qty 3, 28d supply, fill #3
  Filled 2024-08-06 (×2): qty 3, 28d supply, fill #4

## 2024-04-22 ENCOUNTER — Ambulatory Visit (HOSPITAL_COMMUNITY)
Admission: RE | Admit: 2024-04-22 | Discharge: 2024-04-22 | Disposition: A | Discharge status: Home / Self Care | Source: Ambulatory Visit | Attending: Cardiology | Admitting: Cardiology

## 2024-04-22 DIAGNOSIS — I5032 Chronic diastolic (congestive) heart failure: Secondary | ICD-10-CM | POA: Insufficient documentation

## 2024-04-22 LAB — BASIC METABOLIC PANEL WITH GFR
Anion gap: 13 (ref 5–15)
BUN: 26 mg/dL — ABNORMAL HIGH (ref 8–23)
CO2: 23 mmol/L (ref 22–32)
Calcium: 9.1 mg/dL (ref 8.9–10.3)
Chloride: 103 mmol/L (ref 98–111)
Creatinine, Ser: 2.09 mg/dL — ABNORMAL HIGH (ref 0.44–1.00)
GFR, Estimated: 23 mL/min — ABNORMAL LOW (ref >60)
Glucose, Bld: 94 mg/dL (ref 70–99)
Potassium: 4.4 mmol/L (ref 3.5–5.1)
Sodium: 139 mmol/L (ref 135–145)

## 2024-05-05 ENCOUNTER — Other Ambulatory Visit: Payer: Self-pay

## 2024-05-05 ENCOUNTER — Other Ambulatory Visit (HOSPITAL_COMMUNITY): Payer: Self-pay

## 2024-05-10 ENCOUNTER — Other Ambulatory Visit (HOSPITAL_COMMUNITY): Payer: Self-pay

## 2024-05-11 ENCOUNTER — Other Ambulatory Visit (HOSPITAL_COMMUNITY): Payer: Self-pay

## 2024-05-11 ENCOUNTER — Ambulatory Visit

## 2024-05-11 VITALS — BP 112/78 | HR 95 | Temp 98.0°F | Ht 67.0 in | Wt 252.0 lb

## 2024-05-11 DIAGNOSIS — Z23 Encounter for immunization: Secondary | ICD-10-CM

## 2024-05-11 DIAGNOSIS — J439 Emphysema, unspecified: Secondary | ICD-10-CM | POA: Diagnosis not present

## 2024-05-11 DIAGNOSIS — J4489 Other specified chronic obstructive pulmonary disease: Secondary | ICD-10-CM | POA: Diagnosis not present

## 2024-05-11 MED ORDER — UMECLIDINIUM-VILANTEROL 62.5-25 MCG/ACT IN AEPB
1.0000 | INHALATION_SPRAY | Freq: Every day | RESPIRATORY_TRACT | 4 refills | Status: AC
  Filled 2024-05-13: qty 60, 60d supply, fill #0
  Filled 2024-06-21 – 2024-06-26 (×3): qty 60, 60d supply, fill #1

## 2024-05-11 NOTE — Progress Notes (Signed)
 Subjective:   PATIENT ID: Brittany Jackson GENDER: female DOB: 08-16-1941, MRN: [id number]   HPI 83 year old female with a past medical history of COPD Gold 2E, history of systolic congestive heart failure which has improved and currently has chronic diastolic CHF, hyperlipidemia, atrial fibrillation on DOAC and amiodarone , obesity, CKD stage III who is presenting to the pulmonary clinic to reestablish care.  She has had PFTs in the past which show at least moderate obstruction.  She is not on any inhalers at this time except her rescue albuterol  inhaler.  Anoro was too expensive and she did not pick it up.  Subsequently she was lost to follow-up.  She reports ongoing shortness of breath on exertion.  She also reports some dry cough without any productive phlegm.  She quit smoking 2 years ago.  She states that she has been doing well otherwise.  She has not had any COPD exacerbations recently.    Past Medical History:  Diagnosis Date   CHF (congestive heart failure) (HCC)    Hypertension    Urticaria      Family History  Problem Relation Age of Onset   Hypertension Sister    Allergic rhinitis Neg Hx    Asthma Neg Hx    Angioedema Neg Hx    Atopy Neg Hx    Eczema Neg Hx    Immunodeficiency Neg Hx    Urticaria Neg Hx      Social History   Socioeconomic History   Marital status: Widowed    Spouse name: Not on file   Number of children: Not on file   Years of education: Not on file   Highest education level: Not on file  Occupational History   Not on file  Tobacco Use   Smoking status: Former    Current packs/day: 0.00    Average packs/day: 1 pack/day for 65.4 years (65.4 ttl pk-yrs)    Types: Cigarettes    Start date: 89    Quit date: 01/08/2022    Years since quitting: 2.3   Smokeless tobacco: Never  Vaping Use   Vaping status: Never Used  Substance and Sexual Activity   Alcohol use: Yes    Comment: occasional   Drug use: No   Sexual activity: Never    Birth  control/protection: Surgical  Other Topics Concern   Not on file  Social History Narrative   Not on file   Social Drivers of Health   Financial Resource Strain: Not on file  Food Insecurity: No Food Insecurity (01/22/2022)   Hunger Vital Sign    Worried About Running Out of Food in the Last Year: Never true    Ran Out of Food in the Last Year: Never true  Transportation Needs: No Transportation Needs (01/22/2022)   PRAPARE - Administrator, Civil Service (Medical): No    Lack of Transportation (Non-Medical): No  Physical Activity: Not on file  Stress: Not on file  Social Connections: Not on file  Intimate Partner Violence: Not on file     Allergies  Allergen Reactions   Chlorhexidine  Other (See Comments)    Burns skin immediately   Codeine Other (See Comments)    Unknown   Meprobamate Swelling   Sulfa Antibiotics Other (See Comments)    Unknown     Outpatient Medications Prior to Visit  Medication Sig Dispense Refill   acetaminophen  (TYLENOL ) 500 MG tablet Take 500-1,000 mg by mouth every 6 (six) hours as needed (pain.).  albuterol  (VENTOLIN  HFA) 108 (90 Base) MCG/ACT inhaler Inhale 2 puffs into the lungs every 6 (six) hours as needed for wheezing or shortness of breath. 8 g 6   amiodarone  (PACERONE ) 200 MG tablet TAKE 1/2 TABLET BY MOUTH DAILY 45 tablet 3   apixaban  (ELIQUIS ) 2.5 MG TABS tablet Take 1 tablet (2.5 mg total) by mouth 2 (two) times daily. 60 tablet 11   atorvastatin  (LIPITOR ) 80 MG tablet TAKE ONE TABLET BY MOUTH DAILY 90 tablet 3   colchicine 0.6 MG tablet Take 0.6 mg by mouth 2 (two) times daily as needed (onset of gout).     diazepam  (VALIUM ) 10 MG tablet Take 1 tablet (10 mg total) by mouth 3 (three) times daily as needed for anxiety. 5 tablet 0   empagliflozin  (JARDIANCE ) 10 MG TABS tablet Take 1 tablet (10 mg total) by mouth daily before breakfast. 90 tablet 3   Finerenone  10 MG TABS Take 1 tablet (10 mg total) by mouth daily. 30 tablet 6    Semaglutide , 2 MG/DOSE, (OZEMPIC , 2 MG/DOSE,) 8 MG/3ML SOPN Inject 2 mg into the skin once a week. 12 mL 1   torsemide  (DEMADEX ) 20 MG tablet Take 1 tablet (20 mg total) by mouth every other day. 45 tablet 3   Multiple Vitamins-Minerals (ADULT ONE DAILY GUMMIES PO) Take 2 tablets by mouth in the morning. (Patient not taking: Reported on 05/11/2024)     No facility-administered medications prior to visit.    ROS Reviewed all systems and reported negative except as above     Objective:  There were no vitals filed for this visit.  Physical Exam General: Elderly female not in acute distress Chest: Clear to auscultation bilaterally Heart: Regular rate and rhythm, normal S1, S2 Abdomen: Soft, nontender, nondistended Extremities: No peripheral edema Neuro: Grossly intact    CBC    Component Value Date/Time   WBC 8.3 04/06/2024 1023   RBC 4.03 04/06/2024 1023   HGB 11.8 (L) 04/06/2024 1023   HCT 36.1 04/06/2024 1023   PLT 320 04/06/2024 1023   MCV 89.6 04/06/2024 1023   MCH 29.3 04/06/2024 1023   MCHC 32.7 04/06/2024 1023   RDW 13.9 04/06/2024 1023   LYMPHSABS 1.7 02/20/2022 1605   MONOABS 0.7 02/20/2022 1605   EOSABS 0.3 02/20/2022 1605   BASOSABS 0.0 02/20/2022 1605     Chest imaging: I reviewed her CT imaging.  She had a CT chest while she was hospitalized which showed bilateral pleural effusions in 2023.  A repeat CT chest performed in September 2023 showed complete resolution of findings related to volume overload and pulmonary edema.  Mild emphysema noted on her CT scan.  PFT:    Latest Ref Rng & Units 01/10/2022   10:51 AM  PFT Results  FVC-Pre L 1.68   FVC-Predicted Pre % 61   Pre FEV1/FVC % % 65   FEV1-Pre L 1.09   FEV1-Predicted Pre % 53   PFTs with an obstructive ratio.  FEV1 of 53% suggestive of moderate obstruction.       Assessment & Plan:   Assessment & Plan COPD with chronic bronchitis and emphysema (HCC) Patient with history of COPD, quit  smoking 2 years ago.  PFTs with obstructive ratio.  CT scan with some evidence of emphysema.  She does not qualify for lung cancer screening.  Currently only on albuterol .  Shortness of breath likely multifactorial in setting of pulmonary disease, cardiac disease, obesity and deconditioning.  She will need LAMA/LABA inhaler, she  wants to try for Anoro again as she already met her deductible for this year.  I also put in a prescription for a nebulizer machine as that would be a cheaper alternative if she is still not able to afford the Anoro inhaler.  Especially with her age she might not be able to fully inhale an inhaler and it might be easier for her to use and nebulizer machine.  She will follow-up in the clinic in 6 months Orders:   umeclidinium-vilanterol (ANORO ELLIPTA ) 62.5-25 MCG/ACT AEPB; Inhale 1 puff into the lungs daily.    Zola Herter, MD [facility] Office: [phone]

## 2024-05-13 ENCOUNTER — Other Ambulatory Visit (HOSPITAL_COMMUNITY): Payer: Self-pay

## 2024-05-14 ENCOUNTER — Other Ambulatory Visit (HOSPITAL_COMMUNITY): Payer: Self-pay

## 2024-05-18 DIAGNOSIS — E785 Hyperlipidemia, unspecified: Secondary | ICD-10-CM | POA: Diagnosis not present

## 2024-05-18 DIAGNOSIS — I509 Heart failure, unspecified: Secondary | ICD-10-CM | POA: Diagnosis not present

## 2024-05-18 DIAGNOSIS — N184 Chronic kidney disease, stage 4 (severe): Secondary | ICD-10-CM | POA: Diagnosis not present

## 2024-05-18 DIAGNOSIS — N2581 Secondary hyperparathyroidism of renal origin: Secondary | ICD-10-CM | POA: Diagnosis not present

## 2024-05-18 DIAGNOSIS — I129 Hypertensive chronic kidney disease with stage 1 through stage 4 chronic kidney disease, or unspecified chronic kidney disease: Secondary | ICD-10-CM | POA: Diagnosis not present

## 2024-05-18 DIAGNOSIS — D649 Anemia, unspecified: Secondary | ICD-10-CM | POA: Diagnosis not present

## 2024-06-04 ENCOUNTER — Other Ambulatory Visit (HOSPITAL_COMMUNITY): Payer: Self-pay

## 2024-06-04 ENCOUNTER — Other Ambulatory Visit: Payer: Self-pay

## 2024-06-11 ENCOUNTER — Other Ambulatory Visit (HOSPITAL_COMMUNITY): Payer: Self-pay | Admitting: Cardiology

## 2024-06-14 ENCOUNTER — Other Ambulatory Visit (HOSPITAL_COMMUNITY): Payer: Self-pay

## 2024-06-21 ENCOUNTER — Other Ambulatory Visit (HOSPITAL_COMMUNITY): Payer: Self-pay

## 2024-06-26 ENCOUNTER — Other Ambulatory Visit (HOSPITAL_COMMUNITY): Payer: Self-pay

## 2024-07-12 NOTE — Progress Notes (Unsigned)
 Patient ID: LYNIA LANDRY                 DOB: [birth year]                    MRN: [id number]     HPI: Brittany Jackson is a 83 y.o. female patient referred to pharmacy clinic by Dr. Rolan to initiate GLP1-RA therapy. PMH is significant for CHF, tobacco use, HTN, COPD, CAD and obesity. Most recent BMI 39.5.  A1c 5.7   Baseline weight and BMI: 252, 39.5  Current meds that affect weight: Currently on semaglutide  2mg  weekly with slow weight gain Her PCP is retiring, need to refer to our pharmacy clinic to manage her semaglutide .     *** If diabetic and on insulin /sulfonylurea, can consider reducing dose to reduce risk of hypoglycemia  *** Follow-up visit  Assess % weight loss Assess adverse effects Missed doses  Diet:   Exercise:   Family History:   Social History:   Labs: Lab Results  Component Value Date   HGBA1C 5.7 (H) 12/27/2022    Wt Readings from Last 1 Encounters:  05/11/24 252 lb (114.3 kg)    BP Readings from Last 1 Encounters:  05/11/24 112/78   Pulse Readings from Last 1 Encounters:  05/11/24 95       Component Value Date/Time   CHOL 118 04/06/2024 1049   TRIG 131 04/06/2024 1049   HDL 48 04/06/2024 1049   CHOLHDL 2.5 04/06/2024 1049   VLDL 26 04/06/2024 1049   LDLCALC 44 04/06/2024 1049    Past Medical History:  Diagnosis Date   CHF (congestive heart failure) (HCC)    Hypertension    Urticaria     Current Outpatient Medications on File Prior to Visit  Medication Sig Dispense Refill   acetaminophen  (TYLENOL ) 500 MG tablet Take 500-1,000 mg by mouth every 6 (six) hours as needed (pain.).     albuterol  (VENTOLIN  HFA) 108 (90 Base) MCG/ACT inhaler Inhale 2 puffs into the lungs every 6 (six) hours as needed for wheezing or shortness of breath. 8 g 6   amiodarone  (PACERONE ) 200 MG tablet TAKE 1/2 TABLET BY MOUTH DAILY 45 tablet 3   apixaban  (ELIQUIS ) 2.5 MG TABS tablet Take 1 tablet (2.5 mg total) by mouth 2 (two) times daily. 60 tablet 11    atorvastatin  (LIPITOR ) 80 MG tablet TAKE ONE TABLET BY MOUTH DAILY 90 tablet 3   colchicine 0.6 MG tablet Take 0.6 mg by mouth 2 (two) times daily as needed (onset of gout).     diazepam  (VALIUM ) 10 MG tablet Take 1 tablet (10 mg total) by mouth 3 (three) times daily as needed for anxiety. 5 tablet 0   empagliflozin  (JARDIANCE ) 10 MG TABS tablet Take 1 tablet (10 mg total) by mouth daily before breakfast. 90 tablet 3   Finerenone  10 MG TABS Take 1 tablet (10 mg total) by mouth daily. 30 tablet 6   Multiple Vitamins-Minerals (ADULT ONE DAILY GUMMIES PO) Take 2 tablets by mouth in the morning. (Patient not taking: Reported on 05/11/2024)     Semaglutide , 2 MG/DOSE, (OZEMPIC , 2 MG/DOSE,) 8 MG/3ML SOPN Inject 2 mg into the skin once a week. 12 mL 1   torsemide  (DEMADEX ) 20 MG tablet Take 1 tablet (20 mg total) by mouth every other day. 45 tablet 3   umeclidinium-vilanterol (ANORO ELLIPTA ) 62.5-25 MCG/ACT AEPB Inhale 1 puff into the lungs daily. 60 each 4   No current facility-administered medications  on file prior to visit.    Allergies  Allergen Reactions   Chlorhexidine  Other (See Comments)    Burns skin immediately   Codeine Other (See Comments)    Unknown   Meprobamate Swelling   Sulfa Antibiotics Other (See Comments)    Unknown     Assessment/Plan:  1. Weight loss - Patient has not met goal of at least 5% of body weight loss with comprehensive lifestyle modifications alone in the past 3-6 months. Pharmacotherapy is appropriate to pursue as augmentation. Will start*** . Confirmed patient not ***pregnant and no personal or family history of medullary thyroid  carcinoma (MTC) or Multiple Endocrine Neoplasia syndrome type 2 (MEN 2). Injection technique reviewed at today's visit.  Advised patient on common side effects including nausea, diarrhea, dyspepsia, decreased appetite, and fatigue. Counseled patient on reducing meal size and how to titrate medication to minimize side effects. Counseled  patient to call if intolerable side effects or if experiencing dehydration, abdominal pain, or dizziness. Along with pharmacotherapy, the patient will follow dietary modifications and aim for at least 150 minutes of moderate-intensity exercise per week, plus resistance training twice a week (as recommended by the American Heart Association). This resistance training--such as weightlifting, bodyweight exercises, or using resistance bands, adapted to the patient's ability--will help prevent muscle loss.  Follow up in 1-2 days regarding coverage of *** . If therapy is initiated, phone follow-ups will be conducted every 4 weeks for dose titration until the patient reaches the effective therapeutic dose and target weight.  Lum Ricks, PharmD Candidate  [facility] [employer]    Eleanor JONETTA Crews, Pharm.D, BCACP, BCPS, CPP [facility] [address] [place], [place] [address]  Phone: [phone]; Fax: [phone]

## 2024-07-13 ENCOUNTER — Ambulatory Visit: Admitting: Pharmacist

## 2024-07-16 ENCOUNTER — Other Ambulatory Visit (HOSPITAL_COMMUNITY): Payer: Self-pay

## 2024-07-23 ENCOUNTER — Other Ambulatory Visit (HOSPITAL_COMMUNITY): Payer: Self-pay

## 2024-08-04 NOTE — Progress Notes (Signed)
 PCP: Bertell Satterfield, MD HF Cardiologist: Dr Rolan Nephrology: Dr. Dennise   Chief complaint: CHF  HPI: Brittany Jackson is a 83 y.o. female with history of chronic diastolic CHF, tobacco use, HTN, former smoker, and COPD.   Admitted 01/08/22 with inferolateral STEMI c/b acute systolic CHF with low-output. No intervention initially. She was seen by CT surgery, CABG not recommended.  Hospital course c/b AKI on CKD Stage III, HAP, and leukocytosis. Briefly on CVVHD and had gradual improvement. Creatinine at d/c down to 2.0.  She had paroxysmal atrial fibrillation during this admission. Developed rash felt to be in the setting of amiodarone . Treated with steroids and switched to amio without dye with improvement. Discharged on 01/22/22.   Echo in 8/23 showed EF 65-70%, normal RV, mild MR, IVC normal. CT chest in 9/23 showed emphysema.   Echo 1/25 with EF 65-70%, RV normal   She returns for followup of CHF.  She reports low energy level, gets tired walking up stairs.  She is short of breath carrying the laundary basket.  She does ok walking on flat ground. No chest pain.  No BRBPR/melena.  No orthopnea/PND.  No palpitations, lightheadedness. Weight is down 10 lbs.   ECG (personally reviewed): NSR, 1st degree AVB, old anterior MI  Labs (5/24): K 3.7, creatinine 2.1, TSH 1.2, LDL 63, A1c 5.7 Labs (8/24): K 4.0, creatinine 1.91, BNP 160 Labs (9/24): K 4.2, creatinine 1.9 Labs (12/24), K 3.9, creatinine 2.04, BNP 109, LDL 59, TSH normal (1.6), Hgb 11.9  Labs (4/25): K 4.2, creatinine 2.13, LFTs normal, TSH normal, BNP 161  PMH: 1. Inflammatory dermatosis 2. HTN 3. CKD stage 3 4. COPD: Smoker until 5/23.  Obstructive PFTs in 5/23.  - CT chest (9/23) with emphysema.  5. Atrial fibrillation: Paroxysmal.  6. Cardiomyopathy: Initially thought to be ischemic but suspect stress (Takotsubo-type) CMP given rapid improvement.  - Echo (5/23): EF 20-25%, mildly decreased RV systolic function, trivial MR, dilated  IVC.  - Echo (5/23, repeat): improvement in EF to 65-70%  - Echo (8/23): EF 65-70%, normal RV, mild MR, IVC normal. - Echo (1/25): EF 65-70%, normal RV 7. CAD: inferolateral STEMI by ECG 5/23 with HS-TnI in 3000 range, LHC with 3VD => 50-60% dLM, ?90% ostial LAD, 75% stenosis at bifurcation of mLAD and D, 75% stenosis at bifurcation of mLCx and OM.  The ostial LAD is not completely laid out and on review, I am not sure that it is truly critical.   ROS: All systems negative except as listed in HPI, PMH and Problem List.  SH:  Social History   Socioeconomic History   Marital status: Widowed    Spouse name: Not on file   Number of children: Not on file   Years of education: Not on file   Highest education level: Not on file  Occupational History   Not on file  Tobacco Use   Smoking status: Former    Current packs/day: 0.00    Average packs/day: 1 pack/day for 65.4 years (65.4 ttl pk-yrs)    Types: Cigarettes    Start date: 53    Quit date: 01/08/2022    Years since quitting: 2.5   Smokeless tobacco: Never  Vaping Use   Vaping status: Never Used  Substance and Sexual Activity   Alcohol use: Yes    Comment: occasional   Drug use: No   Sexual activity: Never    Birth control/protection: Surgical  Other Topics Concern   Not on file  Social  History Narrative   Not on file   Social Drivers of Health   Tobacco Use: Medium Risk (05/11/2024)   Patient History    Smoking Tobacco Use: Former    Smokeless Tobacco Use: Never    Passive Exposure: Not on file  Financial Resource Strain: Not on file  Food Insecurity: No Food Insecurity (01/22/2022)   Hunger Vital Sign    Worried About Running Out of Food in the Last Year: Never true    Ran Out of Food in the Last Year: Never true  Transportation Needs: No Transportation Needs (01/22/2022)   PRAPARE - Administrator, Civil Service (Medical): No    Lack of Transportation (Non-Medical): No  Physical Activity: Not on file   Stress: Not on file  Social Connections: Not on file  Intimate Partner Violence: Not on file  Depression (PHQ2-9): Medium Risk (01/09/2023)   Depression (PHQ2-9)    PHQ-2 Score: 8  Alcohol Screen: Not on file  Housing: Low Risk (01/22/2022)   Housing    Last Housing Risk Score: 0  Utilities: Not on file  Health Literacy: Not on file   FH:  Family History  Problem Relation Age of Onset   Hypertension Sister    Allergic rhinitis Neg Hx    Asthma Neg Hx    Angioedema Neg Hx    Atopy Neg Hx    Eczema Neg Hx    Immunodeficiency Neg Hx    Urticaria Neg Hx    Past Medical History:  Diagnosis Date   CHF (congestive heart failure) (HCC)    Hypertension    Urticaria    Current Outpatient Medications  Medication Sig Dispense Refill   acetaminophen  (TYLENOL ) 500 MG tablet Take 500-1,000 mg by mouth every 6 (six) hours as needed (pain.).     albuterol  (VENTOLIN  HFA) 108 (90 Base) MCG/ACT inhaler Inhale 2 puffs into the lungs every 6 (six) hours as needed for wheezing or shortness of breath. 8 g 6   amiodarone  (PACERONE ) 200 MG tablet TAKE 1/2 TABLET BY MOUTH DAILY 45 tablet 3   apixaban  (ELIQUIS ) 2.5 MG TABS tablet Take 1 tablet (2.5 mg total) by mouth 2 (two) times daily. 60 tablet 11   atorvastatin  (LIPITOR ) 80 MG tablet TAKE ONE TABLET BY MOUTH DAILY 90 tablet 3   colchicine 0.6 MG tablet Take 0.6 mg by mouth 2 (two) times daily as needed (onset of gout).     diazepam  (VALIUM ) 10 MG tablet Take 1 tablet (10 mg total) by mouth 3 (three) times daily as needed for anxiety. 5 tablet 0   empagliflozin  (JARDIANCE ) 10 MG TABS tablet Take 1 tablet (10 mg total) by mouth daily before breakfast. 90 tablet 3   Finerenone  10 MG TABS Take 1 tablet (10 mg total) by mouth daily. 30 tablet 6   Multiple Vitamins-Minerals (ADULT ONE DAILY GUMMIES PO) Take 2 tablets by mouth in the morning. (Patient not taking: Reported on 05/11/2024)     Semaglutide , 2 MG/DOSE, (OZEMPIC , 2 MG/DOSE,) 8 MG/3ML SOPN Inject  2 mg into the skin once a week. 12 mL 1   torsemide  (DEMADEX ) 20 MG tablet Take 1 tablet (20 mg total) by mouth every other day. 45 tablet 3   umeclidinium-vilanterol (ANORO ELLIPTA ) 62.5-25 MCG/ACT AEPB Inhale 1 puff into the lungs daily. 60 each 4   No current facility-administered medications for this visit.   There were no vitals taken for this visit.  Wt Readings from Last 3 Encounters:  05/11/24  114.3 kg (252 lb)  04/06/24 116.4 kg (256 lb 9.6 oz)  12/02/23 120.9 kg (266 lb 9.6 oz)   PHYSICAL EXAM: General: NAD Neck: No JVD, no thyromegaly or thyroid  nodule.  Lungs: Clear to auscultation bilaterally with normal respiratory effort. CV: Nondisplaced PMI.  Heart regular S1/S2, no S3/S4, no murmur.  No peripheral edema.  No carotid bruit.  Normal pedal pulses.  Abdomen: Soft, nontender, no hepatosplenomegaly, no distention.  Skin: Intact without lesions or rashes.  Neurologic: Alert and oriented x 3.  Psych: Normal affect. Extremities: No clubbing or cyanosis.  HEENT: Normal.   ASSESSMENT/PLAN:  1. CAD: Patient presented with ACS in 5/23, peak HS-TnI 3557. LHC with 3VD => 50-60% dLM, ?90% ostial LAD, 75% stenosis at bifurcation of mLAD and D, 75% stenosis at bifurcation of mLCx and OM.  The ostial LAD is not completely laid out and on review, not sure that it is truly critical.  CABG would be a consideration if LAD disease is severe, would require FFR to determine this. PCI distal left main into LAD also an option if FFR shows severe disease. However, she remains CP free. No anginal symptoms and EF has recovered.  - Plan to hold off on further evaluation for now, given recovered EF and elevated SCr (2.13 last check) - No ASA given need for Eliquis  - Continue high intensity statin, atorvastatin  80 mg. Lipids today.  2. Cardiomyopathy: Possible stress (Takotsubo-type) cardiomyopathy. Patient has CAD as above, but not sure it explains the extent of her initial cardiomyopathy in 5/23 and  her rapid improvement.  Low output on initial RHC in 5/23 with CI 1.8.  Echo in 5/23 initially with EF 20-25%, mildly decreased RV systolic function, trivial MR, dilated IVC. Repeat echo later in 5/23 with improvement in EF to 65-70% => suspect Takotsubo CMP.  Echo in 8/23 showed EF 65-70%, normal RV, mild MR, IVC normal. Most recent echo in 1/25 with EF 65-70%, RV normal. NYHA class II, confounded by obesity/deconditioning and chronic LBP.  Weight is down and she is not volume overloaded on exam.  - Continue torsemide  20 mg every other day. Check BMP/BNP today  - Continue Jardiance  10 mg daily.  - Add finerenone  10 mg daily.  BMET in 10 days.  3.  Atrial fibrillation: Paroxysmal.  EKG today shows NSR  - Continue amiodarone  100 mg daily. Check LFTs and TSH, will need regular eye exam. - Continue Eliquis  2.5 mg bid (reduced dose due to age and SCr). Check CBC today   4.  CKD Stage 3: AKI in 5/23 with transient CVVH.  Last Scr was 2.13.   - She followed with Nephrology. - on SGLT2i, Jardiance   - Adding finerenone  as above.  - obtain BMP today  5. COPD: Patient has quit smoking.  - She has followup with pulmonary.  6. Obesity: There is no height or weight on file to calculate BMI. - On semaglutide  with slow weight gain.  Her PCP is retiring, need to refer to our pharmacy clinic to manage her semaglutide .  7. Anemia: Check Fe studies, iron infusion if low.    Followup 4 months with APP  I spent 31 minutes reviewing records, interviewing/examining patient, and managing orders.   Harlene HERO [place],  08/04/2024

## 2024-08-05 ENCOUNTER — Telehealth (HOSPITAL_COMMUNITY): Payer: Self-pay

## 2024-08-05 NOTE — Telephone Encounter (Signed)
 Called to confirm/remind patient of their appointment at the [facility] on 08/06/24.   Appointment:   [x] Confirmed  [] Left mess   [] No answer/No voice mail  [] VM Full/unable to leave message  [] Phone not in service  Patient reminded to bring all medications and/or complete list.  Confirmed patient has transportation. Gave directions, instructed to utilize valet parking.

## 2024-08-06 ENCOUNTER — Ambulatory Visit (HOSPITAL_COMMUNITY)
Admission: RE | Admit: 2024-08-06 | Discharge: 2024-08-06 | Disposition: A | Discharge status: Home / Self Care | Source: Ambulatory Visit | Attending: Family Medicine | Admitting: Family Medicine

## 2024-08-06 ENCOUNTER — Ambulatory Visit (HOSPITAL_COMMUNITY): Payer: Self-pay | Admitting: Family Medicine

## 2024-08-06 ENCOUNTER — Other Ambulatory Visit (HOSPITAL_COMMUNITY): Payer: Self-pay

## 2024-08-06 ENCOUNTER — Encounter (HOSPITAL_COMMUNITY): Payer: Self-pay

## 2024-08-06 VITALS — BP 128/68 | HR 72 | Ht 67.0 in | Wt 246.6 lb

## 2024-08-06 DIAGNOSIS — Z955 Presence of coronary angioplasty implant and graft: Secondary | ICD-10-CM | POA: Diagnosis not present

## 2024-08-06 DIAGNOSIS — Z8249 Family history of ischemic heart disease and other diseases of the circulatory system: Secondary | ICD-10-CM | POA: Insufficient documentation

## 2024-08-06 DIAGNOSIS — Z7984 Long term (current) use of oral hypoglycemic drugs: Secondary | ICD-10-CM | POA: Insufficient documentation

## 2024-08-06 DIAGNOSIS — I2510 Atherosclerotic heart disease of native coronary artery without angina pectoris: Secondary | ICD-10-CM | POA: Diagnosis not present

## 2024-08-06 DIAGNOSIS — E669 Obesity, unspecified: Secondary | ICD-10-CM | POA: Diagnosis not present

## 2024-08-06 DIAGNOSIS — I252 Old myocardial infarction: Secondary | ICD-10-CM | POA: Diagnosis not present

## 2024-08-06 DIAGNOSIS — J449 Chronic obstructive pulmonary disease, unspecified: Secondary | ICD-10-CM | POA: Insufficient documentation

## 2024-08-06 DIAGNOSIS — N183 Chronic kidney disease, stage 3 unspecified: Secondary | ICD-10-CM | POA: Diagnosis not present

## 2024-08-06 DIAGNOSIS — Z79899 Other long term (current) drug therapy: Secondary | ICD-10-CM | POA: Insufficient documentation

## 2024-08-06 DIAGNOSIS — G8929 Other chronic pain: Secondary | ICD-10-CM | POA: Diagnosis not present

## 2024-08-06 DIAGNOSIS — M545 Low back pain, unspecified: Secondary | ICD-10-CM | POA: Diagnosis not present

## 2024-08-06 DIAGNOSIS — I5042 Chronic combined systolic (congestive) and diastolic (congestive) heart failure: Secondary | ICD-10-CM | POA: Insufficient documentation

## 2024-08-06 DIAGNOSIS — I130 Hypertensive heart and chronic kidney disease with heart failure and stage 1 through stage 4 chronic kidney disease, or unspecified chronic kidney disease: Secondary | ICD-10-CM | POA: Insufficient documentation

## 2024-08-06 DIAGNOSIS — I5032 Chronic diastolic (congestive) heart failure: Secondary | ICD-10-CM

## 2024-08-06 DIAGNOSIS — Z6838 Body mass index (BMI) 38.0-38.9, adult: Secondary | ICD-10-CM | POA: Insufficient documentation

## 2024-08-06 DIAGNOSIS — Z87891 Personal history of nicotine dependence: Secondary | ICD-10-CM | POA: Insufficient documentation

## 2024-08-06 DIAGNOSIS — Z7901 Long term (current) use of anticoagulants: Secondary | ICD-10-CM | POA: Diagnosis not present

## 2024-08-06 DIAGNOSIS — I480 Paroxysmal atrial fibrillation: Secondary | ICD-10-CM | POA: Diagnosis not present

## 2024-08-06 LAB — BASIC METABOLIC PANEL WITH GFR
Anion gap: 9 (ref 5–15)
BUN: 23 mg/dL (ref 8–23)
CO2: 27 mmol/L (ref 22–32)
Calcium: 9.4 mg/dL (ref 8.9–10.3)
Chloride: 104 mmol/L (ref 98–111)
Creatinine, Ser: 1.93 mg/dL — ABNORMAL HIGH (ref 0.44–1.00)
GFR, Estimated: 25 mL/min — ABNORMAL LOW
Glucose, Bld: 87 mg/dL (ref 70–99)
Potassium: 5.3 mmol/L — ABNORMAL HIGH (ref 3.5–5.1)
Sodium: 140 mmol/L (ref 135–145)

## 2024-08-06 LAB — CBC
HCT: 37.9 % (ref 36.0–46.0)
Hemoglobin: 12.3 g/dL (ref 12.0–15.0)
MCH: 30.1 pg (ref 26.0–34.0)
MCHC: 32.5 g/dL (ref 30.0–36.0)
MCV: 92.7 fL (ref 80.0–100.0)
Platelets: 316 K/uL (ref 150–400)
RBC: 4.09 MIL/uL (ref 3.87–5.11)
RDW: 13.6 % (ref 11.5–15.5)
WBC: 8.1 K/uL (ref 4.0–10.5)
nRBC: 0 % (ref 0.0–0.2)

## 2024-08-06 NOTE — Patient Instructions (Addendum)
 There has been no changes to your medications.  Labs done today, your results will be available in MyChart, we will contact you for abnormal readings.  Your physician recommends that you schedule a follow-up appointment in: 6 months ( June 2026) ** PLEASE CALL THE OFFICE IN APRIL TO ARRANGE YOUR FOLLOW UP APPOINTMENT.**  If you have any questions or concerns before your next appointment please send us  a message through [facility] or call our office at [phone].    TO LEAVE A MESSAGE FOR THE NURSE SELECT OPTION 2, PLEASE LEAVE A MESSAGE INCLUDING: YOUR NAME DATE OF BIRTH CALL BACK NUMBER REASON FOR CALL**this is important as we prioritize the call backs  YOU WILL RECEIVE A CALL BACK THE SAME DAY AS LONG AS YOU CALL BEFORE 4:00 PM  At the [facility], you and your health needs are our priority. As part of our continuing mission to provide you with exceptional heart care, we have created designated Provider Care Teams. These Care Teams include your primary Cardiologist (physician) and Advanced Practice Providers (APPs- Physician Assistants and Nurse Practitioners) who all work together to provide you with the care you need, when you need it.   You may see any of the following providers on your designated Care Team at your next follow up: Dr Toribio Fuel Dr Ezra Shuck Dr. Morene Brownie Greig Mosses, NP Caffie Shed, [place] [facility] [place], [place] Beckey Coe, NP Jordan Lee, NP Ellouise Class, NP Tinnie Redman, PharmD Jaun Bash, PharmD   Please be sure to bring in all your medications bottles to every appointment.    Thank you for choosing [facility]

## 2024-08-09 ENCOUNTER — Other Ambulatory Visit: Payer: Self-pay

## 2024-08-09 ENCOUNTER — Other Ambulatory Visit (HOSPITAL_COMMUNITY): Payer: Self-pay

## 2024-08-10 ENCOUNTER — Other Ambulatory Visit (HOSPITAL_COMMUNITY): Payer: Self-pay

## 2024-08-10 MED ORDER — KERENDIA 10 MG PO TABS
10.0000 mg | ORAL_TABLET | Freq: Every day | ORAL | 6 refills | Status: AC
  Filled 2024-09-05: qty 30, 30d supply, fill #0
  Filled 2024-09-09: qty 5, 5d supply, fill #0

## 2024-08-17 ENCOUNTER — Ambulatory Visit (HOSPITAL_COMMUNITY): Payer: Self-pay | Admitting: Family Medicine

## 2024-08-17 ENCOUNTER — Ambulatory Visit (HOSPITAL_COMMUNITY)
Admission: RE | Admit: 2024-08-17 | Discharge: 2024-08-17 | Disposition: A | Discharge status: Home / Self Care | Source: Ambulatory Visit | Attending: Cardiology | Admitting: Cardiology

## 2024-08-17 DIAGNOSIS — I5032 Chronic diastolic (congestive) heart failure: Secondary | ICD-10-CM | POA: Diagnosis present

## 2024-08-17 LAB — BASIC METABOLIC PANEL WITH GFR
Anion gap: 15 (ref 5–15)
BUN: 25 mg/dL — ABNORMAL HIGH (ref 8–23)
CO2: 24 mmol/L (ref 22–32)
Calcium: 9.3 mg/dL (ref 8.9–10.3)
Chloride: 101 mmol/L (ref 98–111)
Creatinine, Ser: 2.1 mg/dL — ABNORMAL HIGH (ref 0.44–1.00)
GFR, Estimated: 23 mL/min — ABNORMAL LOW
Glucose, Bld: 113 mg/dL — ABNORMAL HIGH (ref 70–99)
Potassium: 4.5 mmol/L (ref 3.5–5.1)
Sodium: 140 mmol/L (ref 135–145)

## 2024-08-20 ENCOUNTER — Ambulatory Visit: Admitting: Pharmacist

## 2024-09-05 ENCOUNTER — Other Ambulatory Visit (HOSPITAL_COMMUNITY): Payer: Self-pay

## 2024-09-09 ENCOUNTER — Other Ambulatory Visit (HOSPITAL_COMMUNITY): Payer: Self-pay

## 2024-09-10 ENCOUNTER — Other Ambulatory Visit: Payer: Self-pay | Admitting: Nurse Practitioner

## 2024-09-10 ENCOUNTER — Other Ambulatory Visit (HOSPITAL_COMMUNITY): Payer: Self-pay

## 2024-09-10 DIAGNOSIS — J4489 Other specified chronic obstructive pulmonary disease: Secondary | ICD-10-CM

## 2024-09-14 ENCOUNTER — Other Ambulatory Visit (HOSPITAL_COMMUNITY): Payer: Self-pay

## 2024-09-14 ENCOUNTER — Other Ambulatory Visit (HOSPITAL_COMMUNITY): Payer: Self-pay | Admitting: Cardiology

## 2024-09-14 ENCOUNTER — Other Ambulatory Visit (HOSPITAL_COMMUNITY): Payer: Self-pay | Admitting: Adult Health

## 2024-09-15 ENCOUNTER — Other Ambulatory Visit: Payer: Self-pay

## 2024-09-15 ENCOUNTER — Telehealth (HOSPITAL_COMMUNITY): Payer: Self-pay

## 2024-09-15 ENCOUNTER — Other Ambulatory Visit (HOSPITAL_COMMUNITY): Payer: Self-pay

## 2024-09-15 MED ORDER — APIXABAN 2.5 MG PO TABS
2.5000 mg | ORAL_TABLET | Freq: Two times a day (BID) | ORAL | 11 refills | Status: AC
  Filled 2024-09-15: qty 180, 90d supply, fill #0
  Filled 2024-09-15: qty 60, 30d supply, fill #0

## 2024-09-15 MED ORDER — EMPAGLIFLOZIN 10 MG PO TABS
10.0000 mg | ORAL_TABLET | Freq: Every day | ORAL | 3 refills | Status: AC
  Filled 2024-09-15: qty 90, 90d supply, fill #0

## 2024-09-15 NOTE — Telephone Encounter (Signed)
 Advanced Heart Failure Patient Advocate Encounter  This patient was approved for a [employer] grant that will help cover the cost of Amiodarone , Eliquis , Jardiance .  Total amount awarded, $7,500.  Effective: 08/16/2024 - 08/15/2025.  BIN [id number] PCN [id number] [id number] ID [id number]  Pharmacy provided with approval and processing information. Confirmed $0 copay for Jardiance  and Eliquis  (both 90 day supply). Patient informed via phone.  Discussed [place]rendia , current pricing shows $479.74 for 90 days, $158.20 for 30 days. Discussed max out of pocket with patient, discussed MPPP for 2026. Patient expressed understanding.  Rachel DEL, CPhT Rx Patient Advocate Phone: [phone]

## 2024-09-16 ENCOUNTER — Other Ambulatory Visit (HOSPITAL_COMMUNITY): Payer: Self-pay

## 2024-09-21 ENCOUNTER — Other Ambulatory Visit (HOSPITAL_COMMUNITY): Payer: Self-pay

## 2024-09-21 MED FILL — Amiodarone HCl Tab 200 MG: ORAL | 90 days supply | Qty: 45 | Fill #0 | Status: AC

## 2024-09-28 ENCOUNTER — Other Ambulatory Visit

## 2024-10-15 ENCOUNTER — Ambulatory Visit: Admitting: Pharmacist
